# Patient Record
Sex: Female | Born: 1985
Health system: Southern US, Community
[De-identification: ages and names within clinical notes are randomized; demographics above are authoritative.]

## PROBLEM LIST (undated history)

## (undated) DIAGNOSIS — D649 Anemia, unspecified: Secondary | ICD-10-CM

## (undated) DIAGNOSIS — K589 Irritable bowel syndrome without diarrhea: Secondary | ICD-10-CM

## (undated) DIAGNOSIS — E7800 Pure hypercholesterolemia, unspecified: Secondary | ICD-10-CM

## (undated) DIAGNOSIS — F909 Attention-deficit hyperactivity disorder, unspecified type: Secondary | ICD-10-CM

## (undated) DIAGNOSIS — F329 Major depressive disorder, single episode, unspecified: Secondary | ICD-10-CM

## (undated) DIAGNOSIS — R87619 Unspecified abnormal cytological findings in specimens from cervix uteri: Secondary | ICD-10-CM

## (undated) DIAGNOSIS — A749 Chlamydial infection, unspecified: Secondary | ICD-10-CM

## (undated) DIAGNOSIS — IMO0002 Reserved for concepts with insufficient information to code with codable children: Secondary | ICD-10-CM

## (undated) DIAGNOSIS — K219 Gastro-esophageal reflux disease without esophagitis: Secondary | ICD-10-CM

## (undated) DIAGNOSIS — T7840XA Allergy, unspecified, initial encounter: Secondary | ICD-10-CM

## (undated) DIAGNOSIS — T148XXA Other injury of unspecified body region, initial encounter: Secondary | ICD-10-CM

## (undated) DIAGNOSIS — E559 Vitamin D deficiency, unspecified: Secondary | ICD-10-CM

## (undated) DIAGNOSIS — F419 Anxiety disorder, unspecified: Secondary | ICD-10-CM

## (undated) DIAGNOSIS — F32A Depression, unspecified: Secondary | ICD-10-CM

## (undated) HISTORY — DX: Attention-deficit hyperactivity disorder, unspecified type: F90.9

## (undated) HISTORY — DX: Vitamin D deficiency, unspecified: E55.9

## (undated) HISTORY — DX: Allergy, unspecified, initial encounter: T78.40XA

## (undated) HISTORY — DX: Anxiety disorder, unspecified: F41.9

## (undated) HISTORY — PX: WISDOM TOOTH EXTRACTION: SHX21

## (undated) HISTORY — DX: Depression, unspecified: F32.A

## (undated) HISTORY — DX: Anemia, unspecified: D64.9

## (undated) HISTORY — DX: Irritable bowel syndrome, unspecified: K58.9

## (undated) HISTORY — PX: COLPOSCOPY: SHX161

---
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
## 2004-04-08 DIAGNOSIS — A749 Chlamydial infection, unspecified: Secondary | ICD-10-CM

## 2004-04-08 HISTORY — DX: Chlamydial infection, unspecified: A74.9

## 2004-04-08 HISTORY — PX: COLPOSCOPY: SHX161

## 2004-05-21 ENCOUNTER — Other Ambulatory Visit: Admission: RE | Admit: 2004-05-21 | Discharge: 2004-05-21 | Payer: Self-pay | Admitting: Obstetrics and Gynecology

## 2004-10-05 ENCOUNTER — Other Ambulatory Visit: Admission: RE | Admit: 2004-10-05 | Discharge: 2004-10-05 | Payer: Self-pay | Admitting: Obstetrics and Gynecology

## 2010-05-28 ENCOUNTER — Emergency Department (HOSPITAL_COMMUNITY)
Admission: EM | Admit: 2010-05-28 | Discharge: 2010-05-28 | Disposition: A | Discharge status: Home / Self Care | Payer: Managed Care, Other (non HMO) | Attending: Emergency Medicine | Admitting: Emergency Medicine

## 2010-05-28 ENCOUNTER — Emergency Department (HOSPITAL_COMMUNITY): Payer: Managed Care, Other (non HMO)

## 2010-05-28 DIAGNOSIS — Z79899 Other long term (current) drug therapy: Secondary | ICD-10-CM | POA: Insufficient documentation

## 2010-05-28 DIAGNOSIS — R109 Unspecified abdominal pain: Secondary | ICD-10-CM | POA: Insufficient documentation

## 2010-05-28 DIAGNOSIS — IMO0001 Reserved for inherently not codable concepts without codable children: Secondary | ICD-10-CM | POA: Insufficient documentation

## 2010-05-28 DIAGNOSIS — R630 Anorexia: Secondary | ICD-10-CM | POA: Insufficient documentation

## 2010-05-28 DIAGNOSIS — R197 Diarrhea, unspecified: Secondary | ICD-10-CM | POA: Insufficient documentation

## 2010-05-28 DIAGNOSIS — R112 Nausea with vomiting, unspecified: Secondary | ICD-10-CM | POA: Insufficient documentation

## 2010-05-28 LAB — DIFFERENTIAL
Basophils Absolute: 0 10*3/uL (ref 0.0–0.1)
Basophils Relative: 0 % (ref 0–1)
Eosinophils Relative: 0 % (ref 0–5)
Monocytes Absolute: 0.7 10*3/uL (ref 0.1–1.0)

## 2010-05-28 LAB — COMPREHENSIVE METABOLIC PANEL
ALT: 14 U/L (ref 0–35)
AST: 28 U/L (ref 0–37)
Albumin: 3.7 g/dL (ref 3.5–5.2)
Calcium: 8.6 mg/dL (ref 8.4–10.5)
GFR calc Af Amer: >60 mL/min (ref >60)
Sodium: 141 mEq/L (ref 135–145)
Total Protein: 6.7 g/dL (ref 6.0–8.3)

## 2010-05-28 LAB — CBC
MCHC: 34.5 g/dL (ref 30.0–36.0)
Platelets: 264 10*3/uL (ref 150–400)
RDW: 12.9 % (ref 11.5–15.5)

## 2010-05-28 LAB — URINALYSIS, ROUTINE W REFLEX MICROSCOPIC
Hgb urine dipstick: NEGATIVE
Specific Gravity, Urine: 1.022 (ref 1.005–1.030)
Urobilinogen, UA: 0.2 mg/dL (ref 0.0–1.0)

## 2010-05-28 LAB — LIPASE, BLOOD: Lipase: 18 U/L (ref 11–59)

## 2011-12-30 ENCOUNTER — Other Ambulatory Visit (HOSPITAL_COMMUNITY): Payer: Self-pay | Admitting: Obstetrics and Gynecology

## 2011-12-30 DIAGNOSIS — N971 Female infertility of tubal origin: Secondary | ICD-10-CM

## 2012-01-06 ENCOUNTER — Ambulatory Visit (HOSPITAL_COMMUNITY)
Admission: RE | Admit: 2012-01-06 | Discharge: 2012-01-06 | Disposition: A | Discharge status: Home / Self Care | Payer: Managed Care, Other (non HMO) | Source: Ambulatory Visit | Attending: Obstetrics and Gynecology | Admitting: Obstetrics and Gynecology

## 2012-01-06 DIAGNOSIS — N971 Female infertility of tubal origin: Secondary | ICD-10-CM

## 2012-01-06 DIAGNOSIS — N979 Female infertility, unspecified: Secondary | ICD-10-CM | POA: Insufficient documentation

## 2012-01-06 MED ORDER — IOHEXOL 300 MG/ML  SOLN
10.0000 mL | Freq: Once | INTRAMUSCULAR | Status: AC | PRN
  Administered 2012-01-06: 10 mL

## 2012-09-30 LAB — OB RESULTS CONSOLE HEPATITIS B SURFACE ANTIGEN: Hepatitis B Surface Ag: NEGATIVE

## 2012-09-30 LAB — OB RESULTS CONSOLE RPR: RPR: NONREACTIVE

## 2012-09-30 LAB — OB RESULTS CONSOLE HIV ANTIBODY (ROUTINE TESTING): HIV: NONREACTIVE

## 2012-09-30 LAB — OB RESULTS CONSOLE GC/CHLAMYDIA
CHLAMYDIA, DNA PROBE: NEGATIVE
GC PROBE AMP, GENITAL: NEGATIVE

## 2012-09-30 LAB — OB RESULTS CONSOLE RUBELLA ANTIBODY, IGM: RUBELLA: IMMUNE

## 2012-09-30 LAB — OB RESULTS CONSOLE ANTIBODY SCREEN: Antibody Screen: NEGATIVE

## 2012-11-16 LAB — OB RESULTS CONSOLE ABO/RH: RH Type: POSITIVE

## 2013-03-16 LAB — OB RESULTS CONSOLE GBS: GBS: POSITIVE

## 2013-03-16 LAB — OB RESULTS CONSOLE GC/CHLAMYDIA: Gonorrhea: POSITIVE

## 2013-04-08 NOTE — L&D Delivery Note (Signed)
Delivery Note At 6:14 PM a viable female, "Angela Dixon", was delivered via Vaginal, Spontaneous Delivery (Presentation: Left Occiput Anterior).  APGAR: 8, 9; weight .   Placenta status: Intact, Spontaneous.  Cord: 3 vessels with the following complications: None.  Cord pH: Arterial 7.28, Venous 7.24 Baby with significant meconium staining.  No evidence abruption on examination of the placenta.  Anesthesia: Local  Episiotomy: None Lacerations: 2nd degree;Perineal Suture Repair: 3.0 chromic Est. Blood Loss (mL): 300 cc  Mom to postpartum.  Baby to Couplet care / Skin to Skin. No circumcision planned.   Donnel Saxon 04/09/2013, 7:15 PM

## 2013-04-09 ENCOUNTER — Encounter (HOSPITAL_COMMUNITY): Payer: Self-pay | Admitting: General Practice

## 2013-04-09 ENCOUNTER — Inpatient Hospital Stay (HOSPITAL_COMMUNITY)
Admission: AD | Admit: 2013-04-09 | Discharge: 2013-04-11 | DRG: 775 | Disposition: A | Discharge status: Home / Self Care | Payer: Managed Care, Other (non HMO) | Source: Ambulatory Visit | Attending: Obstetrics and Gynecology | Admitting: Obstetrics and Gynecology

## 2013-04-09 DIAGNOSIS — O99892 Other specified diseases and conditions complicating childbirth: Secondary | ICD-10-CM | POA: Diagnosis present

## 2013-04-09 DIAGNOSIS — E559 Vitamin D deficiency, unspecified: Secondary | ICD-10-CM | POA: Diagnosis present

## 2013-04-09 DIAGNOSIS — Z22330 Carrier of Group B streptococcus: Secondary | ICD-10-CM

## 2013-04-09 DIAGNOSIS — K219 Gastro-esophageal reflux disease without esophagitis: Secondary | ICD-10-CM | POA: Diagnosis present

## 2013-04-09 DIAGNOSIS — O9989 Other specified diseases and conditions complicating pregnancy, childbirth and the puerperium: Secondary | ICD-10-CM

## 2013-04-09 DIAGNOSIS — O4100X0 Oligohydramnios, unspecified trimester, not applicable or unspecified: Secondary | ICD-10-CM | POA: Diagnosis present

## 2013-04-09 HISTORY — DX: Other injury of unspecified body region, initial encounter: T14.8XXA

## 2013-04-09 HISTORY — DX: Gastro-esophageal reflux disease without esophagitis: K21.9

## 2013-04-09 HISTORY — DX: Chlamydial infection, unspecified: A74.9

## 2013-04-09 HISTORY — DX: Unspecified abnormal cytological findings in specimens from cervix uteri: R87.619

## 2013-04-09 HISTORY — DX: Reserved for concepts with insufficient information to code with codable children: IMO0002

## 2013-04-09 HISTORY — DX: Pure hypercholesterolemia, unspecified: E78.00

## 2013-04-09 LAB — CBC
HEMATOCRIT: 34.8 % — AB (ref 36.0–46.0)
HEMOGLOBIN: 11.6 g/dL — AB (ref 12.0–15.0)
MCH: 27.4 pg (ref 26.0–34.0)
MCHC: 33.3 g/dL (ref 30.0–36.0)
MCV: 82.3 fL (ref 78.0–100.0)
Platelets: 218 10*3/uL (ref 150–400)
RBC: 4.23 MIL/uL (ref 3.87–5.11)
RDW: 14.3 % (ref 11.5–15.5)
WBC: 13.2 10*3/uL — ABNORMAL HIGH (ref 4.0–10.5)

## 2013-04-09 LAB — ABO/RH: ABO/RH(D): A POS

## 2013-04-09 LAB — RPR: RPR Ser Ql: NONREACTIVE

## 2013-04-09 LAB — TYPE AND SCREEN
ABO/RH(D): A POS
Antibody Screen: NEGATIVE

## 2013-04-09 LAB — AMNISURE RUPTURE OF MEMBRANE (ROM) NOT AT ARMC: Amnisure ROM: NEGATIVE

## 2013-04-09 MED ORDER — TETANUS-DIPHTH-ACELL PERTUSSIS 5-2.5-18.5 LF-MCG/0.5 IM SUSP: 0.5000 mL | Freq: Once | INTRAMUSCULAR | Status: DC

## 2013-04-09 MED ORDER — WITCH HAZEL-GLYCERIN EX PADS: 1.0000 "application " | MEDICATED_PAD | CUTANEOUS | Status: DC | PRN

## 2013-04-09 MED ORDER — ONDANSETRON HCL 4 MG/2ML IJ SOLN
4.0000 mg | Freq: Four times a day (QID) | INTRAMUSCULAR | Status: DC | PRN
  Administered 2013-04-09 (×2): 4 mg via INTRAVENOUS
  Filled 2013-04-09 (×2): qty 2

## 2013-04-09 MED ORDER — LIDOCAINE HCL (PF) 1 % IJ SOLN
30.0000 mL | INTRAMUSCULAR | Status: DC | PRN
  Administered 2013-04-09: 30 mL via SUBCUTANEOUS
  Filled 2013-04-09 (×2): qty 30

## 2013-04-09 MED ORDER — LACTATED RINGERS IV SOLN: 500.0000 mL | INTRAVENOUS | Status: DC | PRN

## 2013-04-09 MED ORDER — LANOLIN HYDROUS EX OINT: TOPICAL_OINTMENT | CUTANEOUS | Status: DC | PRN

## 2013-04-09 MED ORDER — DIBUCAINE 1 % RE OINT: 1.0000 "application " | TOPICAL_OINTMENT | RECTAL | Status: DC | PRN

## 2013-04-09 MED ORDER — PENICILLIN G POTASSIUM 5000000 UNITS IJ SOLR
2.5000 10*6.[IU] | INTRAVENOUS | Status: DC
  Administered 2013-04-09: 2.5 10*6.[IU] via INTRAVENOUS
  Filled 2013-04-09 (×4): qty 2.5

## 2013-04-09 MED ORDER — BUTORPHANOL TARTRATE 1 MG/ML IJ SOLN: 1.0000 mg | INTRAMUSCULAR | Status: DC | PRN

## 2013-04-09 MED ORDER — DIPHENHYDRAMINE HCL 25 MG PO CAPS: 25.0000 mg | ORAL_CAPSULE | Freq: Four times a day (QID) | ORAL | Status: DC | PRN

## 2013-04-09 MED ORDER — PRENATAL MULTIVITAMIN CH
1.0000 | ORAL_TABLET | Freq: Every day | ORAL | Status: DC
  Administered 2013-04-10: 1 via ORAL
  Filled 2013-04-09 (×2): qty 1

## 2013-04-09 MED ORDER — IBUPROFEN 600 MG PO TABS
600.0000 mg | ORAL_TABLET | Freq: Four times a day (QID) | ORAL | Status: DC | PRN
Start: 2013-04-09 — End: 2013-04-09

## 2013-04-09 MED ORDER — BENZOCAINE-MENTHOL 20-0.5 % EX AERO
1.0000 "application " | INHALATION_SPRAY | CUTANEOUS | Status: DC | PRN
  Administered 2013-04-09: 1 via TOPICAL
  Filled 2013-04-09: qty 56

## 2013-04-09 MED ORDER — IBUPROFEN 600 MG PO TABS
600.0000 mg | ORAL_TABLET | Freq: Four times a day (QID) | ORAL | Status: DC
  Administered 2013-04-09 – 2013-04-11 (×6): 600 mg via ORAL
  Filled 2013-04-09 (×7): qty 1

## 2013-04-09 MED ORDER — ZOLPIDEM TARTRATE 5 MG PO TABS: 5.0000 mg | ORAL_TABLET | Freq: Every evening | ORAL | Status: DC | PRN

## 2013-04-09 MED ORDER — SIMETHICONE 80 MG PO CHEW: 80.0000 mg | CHEWABLE_TABLET | ORAL | Status: DC | PRN

## 2013-04-09 MED ORDER — OXYTOCIN 40 UNITS IN LACTATED RINGERS INFUSION - SIMPLE MED
62.5000 mL/h | INTRAVENOUS | Status: DC
  Administered 2013-04-09: 62.5 mL/h via INTRAVENOUS
  Filled 2013-04-09: qty 1000

## 2013-04-09 MED ORDER — ACETAMINOPHEN 325 MG PO TABS: 650.0000 mg | ORAL_TABLET | ORAL | Status: DC | PRN

## 2013-04-09 MED ORDER — CITRIC ACID-SODIUM CITRATE 334-500 MG/5ML PO SOLN
30.0000 mL | ORAL | Status: DC | PRN
  Filled 2013-04-09: qty 15

## 2013-04-09 MED ORDER — PENICILLIN G POTASSIUM 5000000 UNITS IJ SOLR
5.0000 10*6.[IU] | Freq: Once | INTRAVENOUS | Status: AC
  Administered 2013-04-09: 5 10*6.[IU] via INTRAVENOUS
  Filled 2013-04-09: qty 5

## 2013-04-09 MED ORDER — OXYTOCIN BOLUS FROM INFUSION: 500.0000 mL | INTRAVENOUS | Status: DC

## 2013-04-09 MED ORDER — ONDANSETRON HCL 4 MG/2ML IJ SOLN: 4.0000 mg | INTRAMUSCULAR | Status: DC | PRN

## 2013-04-09 MED ORDER — OXYCODONE-ACETAMINOPHEN 5-325 MG PO TABS
1.0000 | ORAL_TABLET | ORAL | Status: DC | PRN
Start: 2013-04-09 — End: 2013-04-11

## 2013-04-09 MED ORDER — OXYCODONE-ACETAMINOPHEN 5-325 MG PO TABS
1.0000 | ORAL_TABLET | ORAL | Status: DC | PRN
  Administered 2013-04-09: 1 via ORAL
  Filled 2013-04-09: qty 1

## 2013-04-09 MED ORDER — ONDANSETRON HCL 4 MG PO TABS: 4.0000 mg | ORAL_TABLET | ORAL | Status: DC | PRN

## 2013-04-09 MED ORDER — FLEET ENEMA 7-19 GM/118ML RE ENEM: 1.0000 | ENEMA | RECTAL | Status: DC | PRN

## 2013-04-09 MED ORDER — SENNOSIDES-DOCUSATE SODIUM 8.6-50 MG PO TABS
2.0000 | ORAL_TABLET | ORAL | Status: DC
Start: 2013-04-10 — End: 2013-04-11
  Administered 2013-04-10 – 2013-04-11 (×2): 2 via ORAL
  Filled 2013-04-09 (×2): qty 2

## 2013-04-09 MED ORDER — LACTATED RINGERS IV SOLN
INTRAVENOUS | Status: DC
  Administered 2013-04-09 (×2): via INTRAVENOUS

## 2013-04-09 NOTE — H&P (Signed)
Angela Dixon is a 28 y.o. female, G1P1 at 19 1/7 weeks, presenting for admission due to likely early labor, ? SROM at 7am, and oligohydramnios.  Seen in office today for regular visit, with UCs q 7 min--NST showed early decels vs variables.  US showed oligohydramnios, but BPP 8/8.  Cervix was 1 1/2 in office.  Patient denies bleeding, reports +FM.  Has had increased yellowish d/c since this am, mixed with mucus.  Problem List: GBS positive Reflux Vit D deficiency Oligohydramnios    History of present pregnancy: Patient entered care at 19 5/7 weeks, as a transfer for waterbirth.   EDC of  was established by LMP.   Anatomy scan:  19 5/7 weeks, with limited anatomy and an posterior placenta.   Additional [place] evaluations:  21 4/7 weeks--f/u anatomy WNL, normal fluid 37 4/7 weeks--? Presentation.  US showed vtx, normal fluid 40 1/7 weeks--Oligo, BPP 8/8, vtx.   Significant prenatal events:  Transfer in at 19 weeks for waterbirth.  Attended WB class, selected [employer] as doula.  Toxo titers done, non-immune, but no evidence of recent infection.  Received flu shot 12/26/12, TDaP 02/15/13.  Had carpal tunnel sx at 33 weeks.   Last evaluation:  04/09/12, with findings as above.  OB History   Grav Para Term Preterm Abortions TAB SAB Ect Mult Living   1              Medical Hx:  Reflux, fx wrist in past, hx colpo, usual childhood illnesses, elevated cholesterol 2012, anemia 2004, hx occasional Xanax use for anxiety.  History reviewed. No pertinent past surgical history. Family History: Mother etoh, varicosities; Father etoh: MGM heart dx, HTN, DM, varicose veins;  MGF heart dx, HTN, kidney dx, lung CA; Sister asthma; PGF COPD, oral cancer; MU DM; PU epilepsy; PGF cancer of pharynx.  Social History:  has no tobacco, alcohol, and drug history on file.  Previous smoker in past, none since pregnancy.  Employed in banking.  Married to [employer], who is involved and supportive.  Denies any illict drug  use or etoh   Prenatal Transfer Tool  Maternal Diabetes: No Genetic Screening: Normal Maternal Ultrasounds/Referrals: Abnormal:  Findings:   Other:Oligo today Fetal Ultrasounds or other Referrals:  None Maternal Substance Abuse:  No Significant Maternal Medications:  None Significant Maternal Lab Results: Lab values include: Group B Strep positive    ROS:  Contractions, leaking fluid, +FM  Allergies:  NKDA    Last menstrual period 07/03/2012.  Physical Exam: Chest clear Heart RRR without murmur Abd gravid, NT, FH 39 cm Pelvic: 1 1/2, 70% at office, vtx Ext: WNL  FHR: Category 2 on arrival, with early decels, very occasioinal late decels, and occasional quick variables. UCs:  q 4-6 min, moderate  Prenatal labs: ABO, Rh:  A+ Antibody:  Neg Rubella:   Immune RPR:   NR HBsAg:   Neg HIV:   NR GBS:  Positive Sickle cell/Hgb electrophoresis:  NA Pap:  08/2012 GC:  Neg early pregnancy Chlamydia:  Neg early pregnancy Genetic screenings:  Normal AFP at [facility] Glucola:  Normal Other:  Hgb WNL       Assessment/Plan: IUP at 40 weeks Early labor Likely SROM around 7am, with MSF GBS positive Desires waterbirth and non-interventive labor    Plan Admit to [facility] per consult with Dr. Charlesetta Garibaldi Routine [facility] orders Monitor FHT at present, will decide regarding WB tub use--likely will defer use, due to probable MSF and hx of ?  FHR decels. Amnisure  Angela Dixon, [place], [place] 04/09/2013, 1:11 PM

## 2013-04-09 NOTE — Progress Notes (Signed)
  Subjective: Breathing with contractions.  Doula and husband at bedside, very supportive.    Objective: BP 121/58  Pulse 79  Temp(Src) 98.2 F (36.8 C) (Oral)  Resp 18  Ht 5\' 4"  (1.626 m)  Wt 218 lb (98.884 kg)  BMI 37.40 kg/m2  SpO2 100%  LMP 07/03/2012 I/O last 3 completed shifts: In: -  Out: 300 [Blood:300]    FHT:  Prolonged decel x 4-5 min, then resolution to baseline of 135-145 with scalp stim UC:   regular, every 3 minutes SVE:   4 cm, 100%, vtx, -1. Leaking light MSF now (amnisure negative on admission)  Assessment / Plan: Active labor MSF GBS positive FHR decels Plan close observation of FHR status.   Consider internal monitoring prn. No use of birth tub.  Angela Dixon, [place] 04/09/2013, 4:30pm

## 2013-04-09 NOTE — Progress Notes (Signed)
Patient ID: Angela Dixon, female   DOB: 1985-07-21, 28 y.o.   MRN: [id number] BP 148/112  Pulse 157  Temp(Src) 97.7 F (36.5 C) (Oral)  Resp 18  Ht 5\' 4"  (1.626 m)  Wt 218 lb (98.884 kg)  BMI 37.40 kg/m2  SpO2 100%  LMP 07/03/2012 Pt in pain from contractions FHTS 130s with LTV with fetal electrode.   cx 8/100/0 Bloody show Active labor with variables Great variability Will monitor closely if varibility decreases or she has prolonged decelerations will consider cesarean delivery Pt is moving fast and may have a vaginal delivery

## 2013-04-10 LAB — CBC
HEMATOCRIT: 28.6 % — AB (ref 36.0–46.0)
Hemoglobin: 9.6 g/dL — ABNORMAL LOW (ref 12.0–15.0)
MCH: 27.6 pg (ref 26.0–34.0)
MCHC: 33.6 g/dL (ref 30.0–36.0)
MCV: 82.2 fL (ref 78.0–100.0)
Platelets: 207 10*3/uL (ref 150–400)
RBC: 3.48 MIL/uL — ABNORMAL LOW (ref 3.87–5.11)
RDW: 14.4 % (ref 11.5–15.5)
WBC: 20.9 10*3/uL — ABNORMAL HIGH (ref 4.0–10.5)

## 2013-04-10 MED ORDER — PANTOPRAZOLE SODIUM 40 MG PO TBEC
40.0000 mg | DELAYED_RELEASE_TABLET | Freq: Every day | ORAL | Status: DC
  Administered 2013-04-10 – 2013-04-11 (×2): 40 mg via ORAL
  Filled 2013-04-10 (×2): qty 1

## 2013-04-10 MED ORDER — LORATADINE 10 MG PO TABS
10.0000 mg | ORAL_TABLET | Freq: Every day | ORAL | Status: DC
  Administered 2013-04-10 – 2013-04-11 (×2): 10 mg via ORAL
  Filled 2013-04-10 (×2): qty 1

## 2013-04-11 NOTE — Discharge Summary (Signed)
Vaginal Delivery Discharge Summary  Angela Dixon  DOB:    05-28-1985 MRN:    [id number] CSN:    [id number]  Date of admission:                  04/09/13  Date of discharge:                   04/11/13  Procedures this admission:  SVB, repair of 2nd degree laceration  Date of Delivery: 04/09/13  Newborn Data:  Live born female  Birth Weight: 7 lb 3.3 oz (3269 g) APGAR: 8, 9  Home with mother. Circumcision Plan: None  History of Present Illness:  [name] is a 28 y.o. female, G1P1001, who presents at [redacted]w[redacted]d weeks gestation. The patient has been followed at the [facility] division of [facility]. She was admitted onset of labor. Her pregnancy has been complicated by:  Patient Active Problem List   Diagnosis Date Noted  . Vaginal delivery 04/09/2013  . Second-degree perineal laceration, with delivery 04/09/2013  MSF FHR decelerations Oligohydramnios  Hospital course:  The patient was admitted from the office in early labor, and had an [place] there showing oligohydramnios, but uncertain SROM.    On arrival at the hospital, she had small amount of leaking of ? Light MSF, but amnisure was negative.  She then ruptured spontaneously and had rapid progression of her labor.  She did have sporadic FHR decels, which kept her from her previous plan of a waterbirth.  She received no pain med in labor by her choice, and progressed to vaginal delivery, attended by Donnel Saxon, CNM. Her labor was complicated by oligohydramnios, FHR decels, and MSF. She proceeded to have a vaginal delivery of a healthy infant. Arterial and venous pH were WNL and the baby did well.  Her delivery was not complicated, with a 2nd degree perineal laceration.  Her postpartum course was not complicated.  She was discharged to home on postpartum day 2 doing well.  Feeding:  breast  Contraception:  Undecided at present  Discharge hemoglobin:  Hemoglobin  Date Value  Range Status  04/10/2013 9.6* 12.0 - 15.0 g/dL Final     REPEATED TO VERIFY     DELTA CHECK NOTED     HCT  Date Value Range Status  04/10/2013 28.6* 36.0 - 46.0 % Final    Discharge Physical Exam:   General: alert Lochia: appropriate Uterine Fundus: firm Incision: healing well DVT Evaluation: No evidence of DVT seen on physical exam. Negative Homan's sign.  Intrapartum Procedures: spontaneous vaginal delivery Postpartum Procedures: none Complications-Operative and Postpartum: 2nd degree perineal laceration  Discharge Diagnoses: Term Pregnancy-delivered and oligohydramnios, MSF, FHR decelerations, rapid labor  Discharge Information:  Activity:           Per [facility] handout Diet:                routine Medications: Ibuprofen Condition:      stable Instructions:   Postpartum Care After Vaginal Delivery  After you deliver your newborn (postpartum period), the usual stay in the hospital is 24 72 hours. If there were problems with your labor or delivery, or if you have other medical problems, you might be in the hospital longer.  While you are in the hospital, you will receive help and instructions on how to care for yourself and your newborn during the postpartum period.  While you are in the hospital:  Be sure to tell your  nurses if you have pain or discomfort, as well as where you feel the pain and what makes the pain worse.  If you had an incision made near your vagina (episiotomy) or if you had some tearing during delivery, the nurses may put ice packs on your episiotomy or tear. The ice packs may help to reduce the pain and swelling.  If you are breastfeeding, you may feel uncomfortable contractions of your uterus for a couple of weeks. This is normal. The contractions help your uterus get back to normal size.  It is normal to have some bleeding after delivery.  For the first 1 3 days after delivery, the flow is red and the amount may be similar to a period.  It is common  for the flow to start and stop.  In the first few days, you may pass some small clots. Let your nurses know if you begin to pass large clots or your flow increases.  Do not  flush blood clots down the toilet before having the nurse look at them.  During the next 3 10 days after delivery, your flow should become more watery and pink or brown-tinged in color.  Ten to fourteen days after delivery, your flow should be a small amount of yellowish-white discharge.  The amount of your flow will decrease over the first few weeks after delivery. Your flow may stop in 6 8 weeks. Most women have had their flow stop by 12 weeks after delivery.  You should change your sanitary pads frequently.  Wash your hands thoroughly with soap and water for at least 20 seconds after changing pads, using the toilet, or before holding or feeding your newborn.  You should feel like you need to empty your bladder within the first 6 8 hours after delivery.  In case you become weak, lightheaded, or faint, call your nurse before you get out of bed for the first time and before you take a shower for the first time.  Within the first few days after delivery, your breasts may begin to feel tender and full. This is called engorgement. Breast tenderness usually goes away within 48 72 hours after engorgement occurs. You may also notice milk leaking from your breasts. If you are not breastfeeding, do not stimulate your breasts. Breast stimulation can make your breasts produce more milk.  Spending as much time as possible with your newborn is very important. During this time, you and your newborn can feel close and get to know each other. Having your newborn stay in your room (rooming in) will help to strengthen the bond with your newborn. It will give you time to get to know your newborn and become comfortable caring for your newborn.  Your hormones change after delivery. Sometimes the hormone changes can temporarily cause you to  feel sad or tearful. These feelings should not last more than a few days. If these feelings last longer than that, you should talk to your caregiver.  If desired, talk to your caregiver about methods of family planning or contraception.  Talk to your caregiver about immunizations. Your caregiver may want you to have the following immunizations before leaving the hospital:  Tetanus, diphtheria, and pertussis (Tdap) or tetanus and diphtheria (Td) immunization. It is very important that you and your family (including grandparents) or others caring for your newborn are up-to-date with the Tdap or Td immunizations. The Tdap or Td immunization can help protect your newborn from getting ill.  Rubella immunization.  Varicella (chickenpox)  immunization.  Influenza immunization. You should receive this annual immunization if you did not receive the immunization during your pregnancy. Document Released: 01/20/2007 Document Revised: 12/18/2011 Document Reviewed: 11/20/2011 [facility] Patient Information 2014 [place].   Postpartum Depression and Baby Blues  The postpartum period begins right after the birth of a baby. During this time, there is often a great amount of joy and excitement. It is also a time of considerable changes in the life of the parent(s). Regardless of how many times a mother gives birth, each child brings new challenges and dynamics to the family. It is not unusual to have feelings of excitement accompanied by confusing shifts in moods, emotions, and thoughts. All mothers are at risk of developing postpartum depression or the "baby blues." These mood changes can occur right after giving birth, or they may occur many months after giving birth. The baby blues or postpartum depression can be mild or severe. Additionally, postpartum depression can resolve rather quickly, or it can be a long-term condition. CAUSES Elevated hormones and their rapid decline are thought to be a main cause  of postpartum depression and the baby blues. There are a number of hormones that radically change during and after pregnancy. Estrogen and progesterone usually decrease immediately after delivering your baby. The level of thyroid hormone and various cortisol steroids also rapidly drop. Other factors that play a major role in these changes include major life events and genetics.  RISK FACTORS If you have any of the following risks for the baby blues or postpartum depression, know what symptoms to watch out for during the postpartum period. Risk factors that may increase the likelihood of getting the baby blues or postpartum depression include:  Havinga personal or family history of depression.  Having depression while being pregnant.  Having premenstrual or oral contraceptive-associated mood issues.  Having exceptional life stress.  Having marital conflict.  Lacking a social support network.  Having a baby with special needs.  Having health problems such as diabetes. SYMPTOMS Baby blues symptoms include:  Brief fluctuations in mood, such as going from extreme happiness to sadness.  Decreased concentration.  Difficulty sleeping.  Crying spells, tearfulness.  Irritability.  Anxiety. Postpartum depression symptoms typically begin within the first month after giving birth. These symptoms include:  Difficulty sleeping or excessive sleepiness.  Marked weight loss.  Agitation.  Feelings of worthlessness.  Lack of interest in activity or food. Postpartum psychosis is a very concerning condition and can be dangerous. Fortunately, it is rare. Displaying any of the following symptoms is cause for immediate medical attention. Postpartum psychosis symptoms include:  Hallucinations and delusions.  Bizarre or disorganized behavior.  Confusion or disorientation. DIAGNOSIS  A diagnosis is made by an evaluation of your symptoms. There are no medical or lab tests that lead to a  diagnosis, but there are various questionnaires that a caregiver may use to identify those with the baby blues, postpartum depression, or psychosis. Often times, a screening tool called the [place] Postnatal Depression Scale is used to diagnose depression in the postpartum period.  TREATMENT The baby blues usually goes away on its own in 1 to 2 weeks. Social support is often all that is needed. You should be encouraged to get adequate sleep and rest. Occasionally, you may be given medicines to help you sleep.  Postpartum depression requires treatment as it can last several months or longer if it is not treated. Treatment may include individual or group therapy, medicine, or both to address any social,  physiological, and psychological factors that may play a role in the depression. Regular exercise, a healthy diet, rest, and social support may also be strongly recommended.  Postpartum psychosis is more serious and needs treatment right away. Hospitalization is often needed. HOME CARE INSTRUCTIONS  Get as much rest as you can. Nap when the baby sleeps.  Exercise regularly. Some women find yoga and walking to be beneficial.  Eat a balanced and nourishing diet.  Do little things that you enjoy. Have a cup of tea, take a bubble bath, read your favorite magazine, or listen to your favorite music.  Avoid alcohol.  Ask for help with household chores, cooking, grocery shopping, or running errands as needed. Do not try to do everything.  Talk to people close to you about how you are feeling. Get support from your partner, family members, friends, or other new moms.  Try to stay positive in how you think. Think about the things you are grateful for.  Do not spend a lot of time alone.  Only take medicine as directed by your caregiver.  Keep all your postpartum appointments.  Let your caregiver know if you have any concerns. SEEK MEDICAL CARE IF: You are having a reaction or problems with your  medicine. SEEK IMMEDIATE MEDICAL CARE IF:  You have suicidal feelings.  You feel you may harm the baby or someone else. Document Released: 12/28/2003 Document Revised: 06/17/2011 Document Reviewed: 01/29/2011 [facility] Patient Information 2014 [place], [place].   Discharge to: home  Follow-up Information   Follow up with [facility]. Schedule an appointment as soon as possible for a visit in 6 weeks. (Call for any questions or concerns.)    Specialty:  Obstetrics and Gynecology   Contact information:   [address] [address] [phone]       Donnel Saxon 04/11/2013

## 2013-04-11 NOTE — Discharge Instructions (Signed)

## 2013-05-11 ENCOUNTER — Ambulatory Visit (HOSPITAL_COMMUNITY)
Admission: RE | Admit: 2013-05-11 | Discharge: 2013-05-11 | Disposition: A | Discharge status: Home / Self Care | Payer: Managed Care, Other (non HMO) | Source: Ambulatory Visit | Attending: Obstetrics and Gynecology | Admitting: Obstetrics and Gynecology

## 2013-05-11 NOTE — Lactation Note (Addendum)
Infant Lactation Consultation Outpatient Visit Note  Patient Name: Angela Dixon Date of Birth: 23-Nov-1985 Angela Dixon  Birth Weight:  7-3 oz  D/C weight : 6-14 oz 1st Dr. Visit - 7-1 oz ( 1/6)   2nd weight - 7-4 oz ( 1/16)  3rd weight - 7-12 oz ( 1/30 )  Gestational Age at Delivery: Gestational Age: <None> Type of Delivery: Vaginal , per mom also has a hx of infertility , mentioned she had (+) breast changes in 1st trimester of pregnancy ( size and sore ness)  [facility] due to low weight gain and having a desire to have a feeding assessment  Breastfeeding History- per mom milk came in 3 days after birth, denies engorgement at that time, or problems with sore nipples.  Mom mentioned Angela Dixon has always been interested in feeding both breast and total feeding time taking 30 -40 mins. She also mentioned Angela Dixon does  Have times of hanging out at the breast .  Frequency of Breastfeeding: per mom every 2-3 hours and sometimes at night 4 hours  Length of Feeding: 30 -40 mins  Voids: >6 Stools: > 2-3 yellowish brown   Supplementing / Method: Pumping:  Type of Pump: DEBP Medela pump -    Frequency: 1st time pumping at consult   Volume:    Comments: reviewed set up and checked flanges , #24 fit well for the right and #24 flange for the left                      Somewhat tight per mom , LC recommended increasing the left to #27 - provided at consult.    Consultation Evaluation: Both breast full, no engorgement , nipples both appear healthy , no breakdown. Last feeding per mom - [phone] for 28 mins ( Angela woke up late ). Angela alert and rooting , showing feeding cues.   Initial Feeding Assessment: Pre-feed Weight:  7-15.9 oz 3626 g  Post-feed Weight: 8-0.1 oz 3630 g  Amount Transferred: 4 ml  Comments: Mom latched Angela Dixon onto the left breast. LC noted Angela Dixon rolling upper lip under , LC flipped upward to flange position.  Noted swallows , increased with breast  compressions. Noted Angela Dixon to become non-nutritive intermittently with feeding. Mom  Stimulated him and he easily was able to be nutritive again. Fed for 10 mins and only transferred off 4 ml . ( CONCERNING )   Additional Feeding Assessment: wet diaper changed and  Re-weight  7-15.2 oz 3608 g , 2nd wet diaper changed again  Pre-feed Weight: 7- 14.8 oz 3594 g  Post-feed Weight: 8-0.6 oz 3646 g Amount Transferred: 52ml ( 22 ml of breast milk transferred and 30 mil from the SNS transferred )  Comments: Due to low volume transferred with 1st latch, low weight gain, and noted intermittent non - nutritive sucking,LC recommended  Adding an SNS while the Angela is feeding at the breast. Therefore getting the Angela into a more consistent feeding pattern ( nutritive ) and  Giving mom the stimulation to increase milk production.  LC showed mom how to set up the ( long term supplementing system) using the white  Medium flow gage attachment . Angela tolerated the flow well and took in 30 ml of formula and fed for 20 mins with a consistent pattern with multiply swallows, Pausing intermittently, but non sucking in a non - nutritive manner. Mom seemed pleased .   Additional Feeding  Assessment: supplemented  10 ml more of formula while mom was post pumping and mom fed   Comments- After feeding with SNS at the breast had mom post pump both breast EBM yield = 24 ml                    - Discussed with mom the Angela is behind with his weight and her milk supply is down. LC recommended starting on Mothers Love Herbs                       Today to increase milk supply , and supplementing and post pumping . Stressed the importance of meeting the nutritional needs of the Angela by increasing the calories today.                       Started at this consult.   Total Breast milk Transferred this Visit: 26  Total Supplement Given: 30 ml ( SNS), 10 ML from bottle ( Formula) ,  While mom pumped LC fed 10 ml Formula Angela Dixon didn't  what anymore )  After mom pumped - she fed 24 ml of EBM ( Angela Dixon rooting , acting hungry )  Total volume at consult - 90 ml   Lactation Plan of Care - Mom - rest and naps when you can , plenty fluids, esp H20 , nutritious snacks and meals                                          - Feedings- Every 2 1/2 - 3 hours                                          - Option #1 - Feed at the breast with SNS ( 2-3 oz ) - post pump for 10 -15 mins                                          - Option #2 - Feed at breast for 15 -20 mins and supplement after with a bottle ( medium base nipple ) 2-3 oz )                                          - Extra pumping - AFTER 3-4 feedings a day post pump for 10 -15 mins both breast together ( can increase to 6 if desire ) - save milk and supplement it back.  Praised mom for her efforts breast feeding                                               Follow-Up- F/U with lactation 2/11 Wednesday 9am                    - LC also recommended attending the [facility] on Monday evenings 7pm or Tuesday at 11am .( mom planned to attend today  Angela Dixon 05/11/2013, 9:51 AM

## 2013-05-19 ENCOUNTER — Ambulatory Visit (HOSPITAL_COMMUNITY)
Admission: RE | Admit: 2013-05-19 | Discharge: 2013-05-19 | Disposition: A | Discharge status: Home / Self Care | Payer: Managed Care, Other (non HMO) | Source: Ambulatory Visit | Attending: Obstetrics and Gynecology | Admitting: Obstetrics and Gynecology

## 2013-05-19 NOTE — Lactation Note (Addendum)
Infant Lactation Consultation Outpatient Visit Note  Patient Name: Angela Dixon Date of Birth: 11-26-1985   Gestational Age at Delivery: Gestational Age: <None> Type of Delivery: Vaginal Delivery  Date of birth -  BW - 7-3 oz  2/3 - 7-15.9 oz  Today's weight - 9-0.3 oz 4092 g   Breastfeeding History- Per mom since [facility] consult last week 2/3 , following the plan and supplementing with  SNS at 4-5 feedings a day , otherwise supplementing with a bottle ( broad base ).  Per mom history of infertility  Frequency of Breastfeeding: every 2-3 hours  Length of Feeding: 20 mins both breast  Voids: >6 Stools: 1-2 yellowish stool   Supplementing / Method: EBM and or formula ( Enfamil - newborn ) up to 2 oz majority of time  Pumping:  Type of Pump: DEBP medela    Frequency: after 2 feedings   Volume:  1-2 oz   Comments:- right #24 flange , on the left #27 flange with a hands free pumping bra     Consultation Evaluation: Both breast full , not engorged , nipples appear heathy , no breakdown ,                                             Baby alert , smiling at times , rooting , acting hungry , appears to have gained weight and looks filled out.                                             Last feeding at 6 am - per mom at breast and supplemented with a bottle.   Initial Feeding Assessment: Pre-feed Weight:9-0.3 oz 4092 g  Post-feed Weight: 9-1.5 oz 4124g  Amount Transferred: 32 oz  Comments: Angela Dixon latched on the right breast , cross cradle with depth and a consistent swallowing pattern. Fed for 12 mins . Per mom comfortable feeding and nipple appeared normal when baby released.   Additional Feeding Assessment: Pre-feed Weight: 9-1.5 oz 4124 g  Post-feed Weight: 9-2.5 oz 4154 g  Amount Transferred: 30 ml  Comments:Angela Dixon latched on the left breast , cross cradle with depth and consistent swallowing patter,  Per mom comfortable with latch , fed 20 mins   Additional Feeding Assessment: wet  diaper changed  Pre-feed Weight: 9-1.7 oz 4130 g  Post-feed Weight: 9 - 2.0 oz 4138 g  Amount Transferred: 8 ml  Comments: re-latched left breast for 10 mins and started hanging out , so mom released suction and finished feeding with  supplementing formula   Total Breast milk Transferred this Visit: 70 ml  Total Supplement Given: ( formula ) 58 ml   Lactation Plan of Care - Change the right flange for pumping to #30 and left if needed to #27. Mom aware to be checking volume.                                       - post pump after am feeding , midday , and after noon , save milk , supplement back                                       -  Follow the same plan as last week - with the 2 options. ( per mom has plan from last week )                                         ( feeding at breast and supplementing  with SNS or bottle )                                       - Continue Herbs - Mother love -                                       - Post pumping is important ,                                          Follow-Up- LC recommended BFSG for support and weight checks - Monday 7pm or Tuesday 11am                   - Per mom 2 month visit with Dr. Karleen Dixon on 06/15/2013       [name] 05/19/2013, 9:19 AM

## 2013-11-10 ENCOUNTER — Other Ambulatory Visit: Payer: Self-pay | Admitting: Internal Medicine

## 2013-12-07 ENCOUNTER — Encounter: Payer: Self-pay | Admitting: *Deleted

## 2013-12-07 DIAGNOSIS — E559 Vitamin D deficiency, unspecified: Secondary | ICD-10-CM | POA: Insufficient documentation

## 2013-12-09 ENCOUNTER — Encounter: Payer: Self-pay | Admitting: Emergency Medicine

## 2013-12-09 ENCOUNTER — Ambulatory Visit (INDEPENDENT_AMBULATORY_CARE_PROVIDER_SITE_OTHER): Payer: Managed Care, Other (non HMO) | Admitting: Emergency Medicine

## 2013-12-09 VITALS — BP 116/80 | HR 82 | Temp 98.0°F | Resp 16 | Wt 229.0 lb

## 2013-12-09 DIAGNOSIS — F3289 Other specified depressive episodes: Secondary | ICD-10-CM

## 2013-12-09 DIAGNOSIS — Z111 Encounter for screening for respiratory tuberculosis: Secondary | ICD-10-CM

## 2013-12-09 DIAGNOSIS — F329 Major depressive disorder, single episode, unspecified: Secondary | ICD-10-CM

## 2013-12-09 DIAGNOSIS — Z0000 Encounter for general adult medical examination without abnormal findings: Secondary | ICD-10-CM

## 2013-12-09 DIAGNOSIS — F32A Depression, unspecified: Secondary | ICD-10-CM

## 2013-12-09 LAB — CBC WITH DIFFERENTIAL/PLATELET
BASOS ABS: 0 10*3/uL (ref 0.0–0.1)
Basophils Relative: 0 % (ref 0–1)
Eosinophils Absolute: 0.3 10*3/uL (ref 0.0–0.7)
Eosinophils Relative: 3 % (ref 0–5)
HEMATOCRIT: 39.7 % (ref 36.0–46.0)
Hemoglobin: 13.3 g/dL (ref 12.0–15.0)
Lymphocytes Relative: 29 % (ref 12–46)
Lymphs Abs: 3 10*3/uL (ref 0.7–4.0)
MCH: 27.6 pg (ref 26.0–34.0)
MCHC: 33.5 g/dL (ref 30.0–36.0)
MCV: 82.4 fL (ref 78.0–100.0)
Monocytes Absolute: 0.8 10*3/uL (ref 0.1–1.0)
Monocytes Relative: 8 % (ref 3–12)
NEUTROS ABS: 6.1 10*3/uL (ref 1.7–7.7)
Neutrophils Relative %: 60 % (ref 43–77)
PLATELETS: 302 10*3/uL (ref 150–400)
RBC: 4.82 MIL/uL (ref 3.87–5.11)
RDW: 14.3 % (ref 11.5–15.5)
WBC: 10.2 10*3/uL (ref 4.0–10.5)

## 2013-12-09 LAB — HEMOGLOBIN A1C
HEMOGLOBIN A1C: 5.4 % (ref <5.7)
MEAN PLASMA GLUCOSE: 108 mg/dL (ref <117)

## 2013-12-09 MED ORDER — SERTRALINE HCL 50 MG PO TABS: 50.0000 mg | ORAL_TABLET | Freq: Every day | ORAL | Status: DC

## 2013-12-09 NOTE — Patient Instructions (Addendum)
Folic Acid in Pregnancy Folic acid is a B vitamin that helps prevent neural tube defects (NTDs). The neural tube is the part of a developing baby that becomes the brain and spinal cord. When the neural tube does not close properly, a baby is born with an NTD. NTDs include spina bifida, hernia of the spinal cord, and the absence of part or all of the brain (anencephaly).  Take folic acid at least 4 weeks before getting pregnant and through the first 3 months of pregnancy. This is when the neural tube is developing. It is available in most multivitamins, as a folic-acid-only supplement, and in some foods. Taking the right amount of folic acid before conception and during pregnancy lessens the chance of having a baby born with an NTD. Giving folic acid will not affect a neural tube defect if it is already present. DIAGNOSIS   An alpha fetoprotein (AFP) blood or amniotic fluid test will show high levels of the alpha fetoprotein if a woman is carrying a baby with an NTD. This test is done on all pregnant women in the first trimester.  An ultrasound may detect an NTD. WHAT YOU CAN DO:  Take a multivitamin with at least 0.4 milligrams (400 micrograms) of folic acid daily at least 4 weeks before getting pregnant and through the first 12 weeks of pregnancy.  If you have already had a pregnancy affected by an NTD, take 4 milligrams (4,000 micrograms) of folic acid daily. Take this amount 1 month before you start trying to get pregnant and continue through the first 3 months of pregnancy. If you have a seizure disorder or take medicines to control seizures, tell your maternity care provider. Continue to take your folic acid unless you are told otherwise.  FOLIC ACID IN FOODS Eat a healthy diet that has foods that contain folic acid, the natural form of the vitamin. Such foods include:  Fortified breakfast cereals.  Lentils.  Asparagus.  Spinach.  Organ meats (liver).  Black beans.  Peanuts (eat  only if you do not have a peanut allergy).  Broccoli.  Strawberries, oranges.  Orange juice (from concentrate is best).  Enriched breads and pasta.  Romaine lettuce. TALK TO YOUR HEALTH CARE PROVIDER IF:  You are in your first trimester and have high blood sugar.  You are in your first trimester and develop a high fever. In almost all cases, a fetus found to have an NTD will need specialized care that may not be available in all hospitals. Talk to your health care provider about what is best for you and your baby. Document Released: 03/28/2003 Document Revised: 08/09/2013 Document Reviewed: 06/28/2009 [facility] Patient Information 2015 [place], [place]. This information is not intended to replace advice given to you by your health care provider. Make sure you discuss any questions you have with your health care provider.  Start 1/2 tab of Zoloft 50 mg x 3 days with increased folic acid and large meal, increase to whole tablet, call with any concerns.   Postpartum Depression and Baby Blues The postpartum period begins right after the birth of a baby. During this time, there is often a great amount of joy and excitement. It is also a time of many changes in the life of the parents. Regardless of how many times a mother gives birth, each child brings new challenges and dynamics to the family. It is not unusual to have feelings of excitement along with confusing shifts in moods, emotions, and thoughts. All mothers are  at risk of developing postpartum depression or the "baby blues." These mood changes can occur right after giving birth, or they may occur many months after giving birth. The baby blues or postpartum depression can be mild or severe. Additionally, postpartum depression can go away rather quickly, or it can be a long-term condition.  CAUSES Raised hormone levels and the rapid drop in those levels are thought to be a main cause of postpartum depression and the baby blues. A number of  hormones change during and after pregnancy. Estrogen and progesterone usually decrease right after the delivery of your baby. The levels of thyroid hormone and various cortisol steroids also rapidly drop. Other factors that play a role in these mood changes include major life events and genetics.  RISK FACTORS If you have any of the following risks for the baby blues or postpartum depression, know what symptoms to watch out for during the postpartum period. Risk factors that may increase the likelihood of getting the baby blues or postpartum depression include:  Having a personal or family history of depression.   Having depression while being pregnant.   Having premenstrual mood issues or mood issues related to oral contraceptives.  Having a lot of life stress.   Having marital conflict.   Lacking a social support network.   Having a baby with special needs.   Having health problems, such as diabetes.  SIGNS AND SYMPTOMS Symptoms of baby blues include:  Brief changes in mood, such as going from extreme happiness to sadness.  Decreased concentration.   Difficulty sleeping.   Crying spells, tearfulness.   Irritability.   Anxiety.  Symptoms of postpartum depression typically begin within the first month after giving birth. These symptoms include:  Difficulty sleeping or excessive sleepiness.   Marked weight loss.   Agitation.   Feelings of worthlessness.   Lack of interest in activity or food.  Postpartum psychosis is a very serious condition and can be dangerous. Fortunately, it is rare. Displaying any of the following symptoms is cause for immediate medical attention. Symptoms of postpartum psychosis include:   Hallucinations and delusions.   Bizarre or disorganized behavior.   Confusion or disorientation.  DIAGNOSIS  A diagnosis is made by an evaluation of your symptoms. There are no medical or lab tests that lead to a diagnosis, but there are  various questionnaires that a health care provider may use to identify those with the baby blues, postpartum depression, or psychosis. Often, a screening tool called the [place] Postnatal Depression Scale is used to diagnose depression in the postpartum period.  TREATMENT The baby blues usually goes away on its own in 1-2 weeks. Social support is often all that is needed. You will be encouraged to get adequate sleep and rest. Occasionally, you may be given medicines to help you sleep.  Postpartum depression requires treatment because it can last several months or longer if it is not treated. Treatment may include individual or group therapy, medicine, or both to address any social, physiological, and psychological factors that may play a role in the depression. Regular exercise, a healthy diet, rest, and social support may also be strongly recommended.  Postpartum psychosis is more serious and needs treatment right away. Hospitalization is often needed. HOME CARE INSTRUCTIONS  Get as much rest as you can. Nap when the baby sleeps.   Exercise regularly. Some women find yoga and walking to be beneficial.   Eat a balanced and nourishing diet.   Do little things that you  enjoy. Have a cup of tea, take a bubble bath, read your favorite magazine, or listen to your favorite music.  Avoid alcohol.   Ask for help with household chores, cooking, grocery shopping, or running errands as needed. Do not try to do everything.   Talk to people close to you about how you are feeling. Get support from your partner, family members, friends, or other new moms.  Try to stay positive in how you think. Think about the things you are grateful for.   Do not spend a lot of time alone.   Only take over-the-counter or prescription medicine as directed by your health care provider.  Keep all your postpartum appointments.   Let your health care provider know if you have any concerns.  SEEK MEDICAL CARE  IF: You are having a reaction to or problems with your medicine. SEEK IMMEDIATE MEDICAL CARE IF:  You have suicidal feelings.   You think you may harm the baby or someone else. MAKE SURE YOU:  Understand these instructions.  Will watch your condition.  Will get help right away if you are not doing well or get worse. Document Released: 12/28/2003 Document Revised: 03/30/2013 Document Reviewed: 01/04/2013 [facility] Patient Information 2015 [place], [place]. This information is not intended to replace advice given to you by your health care provider. Make sure you discuss any questions you have with your health care provider.

## 2013-12-09 NOTE — Progress Notes (Signed)
Subjective:    Patient ID: Angela Dixon, female    DOB: 09-Jan-1986, 28 y.o.   MRN: [id number]  HPI Comments: 27 yo pleasant WF CPE. She has an 8 month healthy baby boy. She has had mild increased stress and a little more emotional since baby started daycare and she returned to work. She notes she easily cries and is having difficulty focusing with work. She denies SI/HI and thinks she is managing OK. She has not been exercising or eating healthy. She has gained weight since pregnancy.   She also notes mild diarrhea on occasion. She notes probiotics have helped some but notes increase around menstrual cycles. She denies IBS history.  She fell last week and scrapped elbow and concerned maybe infected vs allergic to neosporin. She notes redness has improved with D/C Neosporin.     WBC            20.9   04/10/2013 HGB             9.6   04/10/2013 HCT            28.6   04/10/2013 PLT             207   04/10/2013 GLUCOSE         135   05/28/2010 ALT              14   05/28/2010 AST              28   05/28/2010 NA              141   05/28/2010 K               4.1   05/28/2010 CL              110   05/28/2010 CREATININE     0.79   05/28/2010 BUN              12   05/28/2010 CO2              17   05/28/2010     Medication List       This list is accurate as of: 12/09/13 12:05 PM.  Always use your most recent med list.               cetirizine 10 MG tablet  Commonly known as:  ZYRTEC  Take 10 mg by mouth daily.     Cholecalciferol 4000 UNITS Caps  Take 1 capsule by mouth daily.     esomeprazole 40 MG capsule  Commonly known as:  NEXIUM  TAKE ONE CAPSULE BY MOUTH EVERY DAY FOR ACID REFLUX     Flax Seed Oil 1000 MG Caps  Take by mouth daily.     Magnesium 500 MG Caps  Take by mouth daily.     multivitamin with minerals Tabs tablet  Take 1 tablet by mouth daily.     PROBIOTIC DAILY PO  Take by mouth.       Allergies  Allergen Reactions  . Neomycin    Past Medical History   Diagnosis Date  . Abnormal Pap smear   . Hypercholesterolemia   . Fracture   . Acid reflux   . Chlamydia 2006  . Vitamin D deficiency   . Anemia    Past Surgical History  Procedure Laterality Date  . Wisdom tooth extraction    . Colposcopy  2006   Family History  Problem Relation Age  of Onset  . Hyperlipidemia Mother   . Asthma Sister   . Hypertension Maternal Grandmother   . Diabetes Maternal Grandmother   . Heart disease Maternal Grandfather   . Cancer Paternal Grandmother     throat  . Cancer Paternal Grandfather     lung   MAINTENANCE: Pap/ Pelvic:05/2013 at GYN WNL EYE:7/2015[facility] Dentist:Q 4 month, 11/2013  IMMUNIZATIONS: Tdap:11/19/10 Influenza:  Patient Care Team: Unk Pinto, MD as PCP - General (Internal Medicine) Daria Pastures, MD as Consulting Physician (Obstetrics and Gynecology) Donnel Saxon, CNM as Midwife (Obstetrics and Gynecology) Dyke Maes, OD (Optometry) Lavone Neri, (Dentist)   Review of Systems  Gastrointestinal: Positive for diarrhea.  Psychiatric/Behavioral: The patient is nervous/anxious.   All other systems reviewed and are negative.  BP 116/80  Pulse 82  Temp(Src) 98 F (36.7 C) (Temporal)  Resp 16  Wt 229 lb (103.874 kg)  LMP 11/17/2013     Objective:   Physical Exam  Nursing note and vitals reviewed. Constitutional: She is oriented to person, place, and time. She appears well-developed and well-nourished. No distress.  HENT:  Head: Normocephalic and atraumatic.  Right Ear: External ear normal.  Left Ear: External ear normal.  Nose: Nose normal.  Mouth/Throat: Oropharynx is clear and moist.  Eyes: Conjunctivae and EOM are normal. Pupils are equal, round, and reactive to light. Right eye exhibits no discharge. Left eye exhibits no discharge. No scleral icterus.  Neck: Normal range of motion. Neck supple. No JVD present. No tracheal deviation present. No thyromegaly present.  Cardiovascular: Normal rate, regular  rhythm, normal heart sounds and intact distal pulses.   Pulmonary/Chest: Effort normal and breath sounds normal.  Abdominal: Soft. Bowel sounds are normal. She exhibits no distension and no mass. There is no tenderness. There is no rebound and no guarding.  Genitourinary:  Def gyn  Musculoskeletal: Normal range of motion. She exhibits no edema and no tenderness.  Lymphadenopathy:    She has no cervical adenopathy.  Neurological: She is alert and oriented to person, place, and time. She has normal reflexes. No cranial nerve deficit. She exhibits normal muscle tone. Coordination normal.  Skin: Skin is warm and dry. No rash noted. No erythema. No pallor.  -Midline back, under left breast 3-4 mm fleshy elevated moles with pigment variation. No change per pt. -Left Elbow nickel size abrasion without infection  Psychiatric: She has a normal mood and affect. Her behavior is normal. Judgment and thought content normal.  Tearful but appropriate    EKG NSCSPT WNL     Assessment & Plan:  1. CPE- Update screening labs/ History/ Immunizations/ Testing as needed. Advised healthy diet, QD exercise, increase H20 and continue RX/ Vitamins AD.  2. Post partum Depression- Advised counseling, increase activity, start Zoloft 50 mg prescriptions AD. (1/2 x 3 days with food and increase folic acid intake, then increase to whole tab). Advised follow if any concerns ER if symptoms increase  3.  Diarrhea- Continue probiotic, bland diet x 2 weeks, advise if no change with adding Zoloft F/U Gyn if no change, w/c if SX increase or ER.   4. ? Mole abnormality- advise close monitoring w/c with any concerns

## 2013-12-10 LAB — BASIC METABOLIC PANEL WITH GFR
BUN: 13 mg/dL (ref 6–23)
CHLORIDE: 103 meq/L (ref 96–112)
CO2: 23 mEq/L (ref 19–32)
Calcium: 9.5 mg/dL (ref 8.4–10.5)
Creat: 0.5 mg/dL (ref 0.50–1.10)
GFR, Est African American: >89 mL/min
Glucose, Bld: 92 mg/dL (ref 70–99)
POTASSIUM: 4.2 meq/L (ref 3.5–5.3)
SODIUM: 137 meq/L (ref 135–145)

## 2013-12-10 LAB — URINALYSIS, ROUTINE W REFLEX MICROSCOPIC
Bilirubin Urine: NEGATIVE
GLUCOSE, UA: NEGATIVE mg/dL
HGB URINE DIPSTICK: NEGATIVE
Ketones, ur: NEGATIVE mg/dL
Leukocytes, UA: NEGATIVE
Nitrite: NEGATIVE
PH: 6.5 (ref 5.0–8.0)
PROTEIN: NEGATIVE mg/dL
Specific Gravity, Urine: 1.006 (ref 1.005–1.030)
Urobilinogen, UA: 0.2 mg/dL (ref 0.0–1.0)

## 2013-12-10 LAB — IRON AND TIBC
%SAT: 11 % — AB (ref 20–55)
IRON: 56 ug/dL (ref 42–145)
TIBC: 489 ug/dL — AB (ref 250–470)
UIBC: 433 ug/dL — ABNORMAL HIGH (ref 125–400)

## 2013-12-10 LAB — HEPATIC FUNCTION PANEL
ALT: 12 U/L (ref 0–35)
AST: 18 U/L (ref 0–37)
Albumin: 4.7 g/dL (ref 3.5–5.2)
Alkaline Phosphatase: 79 U/L (ref 39–117)
Bilirubin, Direct: 0.1 mg/dL (ref 0.0–0.3)
Indirect Bilirubin: 0.2 mg/dL (ref 0.2–1.2)
Total Bilirubin: 0.3 mg/dL (ref 0.2–1.2)
Total Protein: 7.6 g/dL (ref 6.0–8.3)

## 2013-12-10 LAB — MAGNESIUM: Magnesium: 2 mg/dL (ref 1.5–2.5)

## 2013-12-10 LAB — TSH: TSH: 1.983 u[IU]/mL (ref 0.350–4.500)

## 2013-12-10 LAB — LIPID PANEL
CHOLESTEROL: 218 mg/dL — AB (ref 0–200)
HDL: 69 mg/dL (ref >39)
LDL Cholesterol: 116 mg/dL — ABNORMAL HIGH (ref 0–99)
Total CHOL/HDL Ratio: 3.2 Ratio
Triglycerides: 166 mg/dL — ABNORMAL HIGH (ref <150)
VLDL: 33 mg/dL (ref 0–40)

## 2013-12-10 LAB — MICROALBUMIN / CREATININE URINE RATIO
CREATININE, URINE: 34 mg/dL
MICROALB/CREAT RATIO: 14.7 mg/g (ref 0.0–30.0)
Microalb, Ur: 0.5 mg/dL (ref 0.00–1.89)

## 2013-12-10 LAB — VITAMIN D 25 HYDROXY (VIT D DEFICIENCY, FRACTURES): VIT D 25 HYDROXY: 43 ng/mL (ref 30–89)

## 2013-12-10 LAB — INSULIN, FASTING: INSULIN FASTING, SERUM: 13.7 u[IU]/mL (ref 2.0–19.6)

## 2013-12-10 LAB — VITAMIN B12: Vitamin B-12: 743 pg/mL (ref 211–911)

## 2013-12-10 LAB — FOLATE RBC: RBC Folate: 774 ng/mL (ref >280)

## 2013-12-13 LAB — TB SKIN TEST
Induration: 0 mm
TB Skin Test: NEGATIVE

## 2013-12-17 ENCOUNTER — Telehealth: Payer: Self-pay | Admitting: *Deleted

## 2013-12-17 MED ORDER — ALPRAZOLAM 1 MG PO TABS: ORAL_TABLET | ORAL | Status: DC

## 2013-12-17 NOTE — Telephone Encounter (Signed)
Patient called and states since starting Zoloft 50 mg, she is having trouble sleeping and sweating at night.  Per Dr Melford Aase, patient can reduce Zoloft to 1/2 tab(25 mg) and RX sent for Xanax for sleep per Dr Melford Aase.

## 2014-02-07 ENCOUNTER — Encounter: Payer: Self-pay | Admitting: Emergency Medicine

## 2014-02-07 ENCOUNTER — Other Ambulatory Visit: Payer: Self-pay | Admitting: *Deleted

## 2014-02-07 DIAGNOSIS — F329 Major depressive disorder, single episode, unspecified: Secondary | ICD-10-CM

## 2014-02-07 DIAGNOSIS — F32A Depression, unspecified: Secondary | ICD-10-CM

## 2014-02-07 MED ORDER — SERTRALINE HCL 50 MG PO TABS: 50.0000 mg | ORAL_TABLET | Freq: Every day | ORAL | Status: DC

## 2014-02-08 ENCOUNTER — Other Ambulatory Visit: Payer: Self-pay

## 2014-02-08 ENCOUNTER — Encounter: Payer: Self-pay | Admitting: Physician Assistant

## 2014-02-08 ENCOUNTER — Ambulatory Visit (INDEPENDENT_AMBULATORY_CARE_PROVIDER_SITE_OTHER): Payer: Managed Care, Other (non HMO) | Admitting: Physician Assistant

## 2014-02-08 VITALS — BP 126/70 | HR 92 | Temp 98.2°F | Resp 16 | Ht 65.0 in | Wt 232.0 lb

## 2014-02-08 DIAGNOSIS — F32A Depression, unspecified: Secondary | ICD-10-CM | POA: Insufficient documentation

## 2014-02-08 DIAGNOSIS — H1011 Acute atopic conjunctivitis, right eye: Secondary | ICD-10-CM

## 2014-02-08 DIAGNOSIS — F411 Generalized anxiety disorder: Secondary | ICD-10-CM | POA: Insufficient documentation

## 2014-02-08 DIAGNOSIS — F329 Major depressive disorder, single episode, unspecified: Secondary | ICD-10-CM

## 2014-02-08 DIAGNOSIS — G479 Sleep disorder, unspecified: Secondary | ICD-10-CM

## 2014-02-08 MED ORDER — ALPRAZOLAM 1 MG PO TABS: ORAL_TABLET | ORAL | Status: DC

## 2014-02-08 MED ORDER — ESOMEPRAZOLE MAGNESIUM 40 MG PO CPDR: 40.0000 mg | DELAYED_RELEASE_CAPSULE | Freq: Every day | ORAL | Status: DC

## 2014-02-08 NOTE — Progress Notes (Addendum)
Subjective:    Patient ID: Angela Dixon, female    DOB: 1986/01/22, 28 y.o.   MRN: [id number]  Eye Problem  The right eye is affected. This is a new problem. The current episode started today. The problem has been resolved. There was no injury mechanism. The pain is at a severity of 0/10. The patient is experiencing no pain. Known exposure: The only one sick right now is her son who has hand, foot and mouth disease. She does not wear contacts (wears glasses but never contacts). Associated symptoms include an eye discharge. Pertinent negatives include no blurred vision, double vision, eye redness, fever, foreign body sensation, itching, nausea, photophobia, recent URI or vomiting. Associated symptoms comments: Patient only had discharge/crusting this morning and states it "feels different".. She has tried nothing for the symptoms. The treatment provided mild relief.  Sore Throat  This is a new problem. The current episode started today. The problem has been unchanged. There has been no fever. The pain is at a severity of 0/10. The patient is experiencing no pain. Associated symptoms include congestion. Pertinent negatives include no abdominal pain, coughing, diarrhea, drooling, ear discharge, ear pain, headaches, hoarse voice, plugged ear sensation, neck pain, shortness of breath, stridor, swollen glands, trouble swallowing or vomiting. Associated symptoms comments: Nasal congestion and thinks that is from allergies.. She has tried nothing for the symptoms.    Depression, Anxiety and Trouble Sleeping-Patient states she has been on Zoloft for two months.  She reports excessive sweating and trouble sleeping, so Dr. Melford Aase called in Xanax for her to take at bedtime.  Last filled 12/17/13.  Patient states she still has some left and does not want to run out.  She states they are helping her sleep.  Patient is not breast feeding son and is not using birth control at this time.  Told patient to stop taking Xanax  if she thinks she could be pregnant.  LMP is 02/06/14.  Patient states the Zoloft is helping her.  Review of Systems  Constitutional: Negative.  Negative for fever, chills and fatigue.  HENT: Positive for congestion and sore throat. Negative for drooling, ear discharge, ear pain, hoarse voice, postnasal drip, rhinorrhea, sinus pressure and trouble swallowing.   Eyes: Positive for discharge. Negative for blurred vision, double vision, photophobia, pain, redness, itching and visual disturbance.  Respiratory: Negative.  Negative for cough, chest tightness, shortness of breath, wheezing and stridor.   Cardiovascular: Negative.  Negative for chest pain.  Gastrointestinal: Negative.  Negative for nausea, vomiting, abdominal pain and diarrhea.  Genitourinary: Negative.   Musculoskeletal: Negative.  Negative for neck pain.  Skin: Negative.  Negative for itching and rash.  Neurological: Negative.  Negative for headaches.  Psychiatric/Behavioral: Positive for sleep disturbance. The patient is nervous/anxious.    Past Medical History  Diagnosis Date  . Abnormal Pap smear   . Hypercholesterolemia   . Fracture   . Acid reflux   . Chlamydia 2006  . Vitamin D deficiency   . Anemia    Current Outpatient Prescriptions on File Prior to Visit  Medication Sig Dispense Refill  . cetirizine (ZYRTEC) 10 MG tablet Take 10 mg by mouth daily.    . Cholecalciferol 4000 UNITS CAPS Take 1 capsule by mouth daily.    . Flaxseed, Linseed, (FLAX SEED OIL) 1000 MG CAPS Take by mouth daily.    . Magnesium 500 MG CAPS Take by mouth daily.    . Multiple Vitamin (MULTIVITAMIN WITH MINERALS) TABS tablet Take 1 tablet  by mouth daily.    . Probiotic Product (PROBIOTIC DAILY PO) Take by mouth.    . sertraline (ZOLOFT) 50 MG tablet Take 1 tablet (50 mg total) by mouth daily. 90 tablet 0   No current facility-administered medications on file prior to visit.   Allergies  Allergen Reactions  . Neomycin      Temp(Src) 98.2  F (36.8 C) (Temporal)  Resp 16  Ht 5\' 5"  (1.651 m)  Wt 232 lb (105.235 kg)  BMI 38.61 kg/m2  LMP 02/06/2014 (Exact Date) Objective:   Physical Exam  Constitutional: She is oriented to person, place, and time. She appears well-developed and well-nourished. She does not have a sickly appearance. She does not appear ill. No distress.  HENT:  Head: Normocephalic and atraumatic. Head is without right periorbital erythema and without left periorbital erythema.  Right Ear: External ear and ear canal normal. No drainage, swelling or tenderness. Tympanic membrane is bulging. Tympanic membrane is not injected, not scarred, not perforated, not erythematous and not retracted. A middle ear effusion is present.  Left Ear: External ear and ear canal normal. No drainage, swelling or tenderness. Tympanic membrane is bulging. Tympanic membrane is not injected, not scarred, not perforated, not erythematous and not retracted. A middle ear effusion is present.  Nose: Nose normal. No mucosal edema, rhinorrhea or sinus tenderness. Right sinus exhibits no maxillary sinus tenderness and no frontal sinus tenderness. Left sinus exhibits no maxillary sinus tenderness and no frontal sinus tenderness.  Mouth/Throat: Uvula is midline, oropharynx is clear and moist and mucous membranes are normal. Mucous membranes are not pale and not dry. No uvula swelling. No oropharyngeal exudate, posterior oropharyngeal edema, posterior oropharyngeal erythema or tonsillar abscesses.  TMs are bulging and not erythematous or edematous bilaterally.  TMs show normal cone of light bilaterally.  Clear fluid behind TMs bilaterally.    Turbinates are pale bilaterally and without erythema or edema.  Eyes: Conjunctivae, EOM and lids are normal. Pupils are equal, round, and reactive to light. Right eye exhibits no discharge, no exudate and no hordeolum. No foreign body present in the right eye. Left eye exhibits no discharge, no exudate and no  hordeolum. No foreign body present in the left eye. Right conjunctiva is not injected. Right conjunctiva has no hemorrhage. Left conjunctiva is not injected. Left conjunctiva has no hemorrhage. No scleral icterus.  Neck: Trachea normal and normal range of motion. Neck supple. No tracheal tenderness present. No tracheal deviation present.  Cardiovascular: Normal rate, regular rhythm, S1 normal, S2 normal, normal heart sounds, intact distal pulses and normal pulses.  Exam reveals no gallop, no distant heart sounds and no friction rub.   No murmur heard. Pulmonary/Chest: Effort normal and breath sounds normal. No accessory muscle usage or stridor. No tachypnea and no bradypnea. No respiratory distress. She has no decreased breath sounds. She has no wheezes. She has no rhonchi. She has no rales. She exhibits no tenderness.  Musculoskeletal: Normal range of motion.  Lymphadenopathy:       Head (right side): No submental, no submandibular, no tonsillar, no preauricular, no posterior auricular and no occipital adenopathy present.       Head (left side): No submental, no submandibular, no tonsillar, no preauricular, no posterior auricular and no occipital adenopathy present.    She has no cervical adenopathy.       Right: No supraclavicular adenopathy present.       Left: No supraclavicular adenopathy present.  Neurological: She is alert and oriented to  person, place, and time. She has normal strength.  Skin: Skin is warm, dry and intact. No rash noted. She is not diaphoretic.  Psychiatric: She has a normal mood and affect. Her speech is normal and behavior is normal. Judgment and thought content normal. Cognition and memory are normal.  Vitals reviewed.     Assessment & Plan:  1. Allergic conjunctivitis, right -Continue the Zyrtec for allergies -Try cold compresses on closed right eye for relief. -While drinking fluids, pinch and hold nose closed and swallow.  This will help open eustachian tubes to  drain the fluid behind ear drums.  -Gave patient information on allergic conjunctivitis. -If you are not better in 7-10 days or see more discharge or pus, then call the office so I can send in antibiotic to pharmacy.  2. Depression Continue Zoloft and Xanax as prescribed.  Patient states the Zoloft is working for Depression at this time.   - ALPRAZolam (XANAX) 1 MG tablet; Take 1/2 to 1 tablet by mouth at bedtime for sleep.  Dispense: 30 tablet; Refill: 0  3. Generalized anxiety disorder Continue Zoloft and Xanax as prescribed.  Told patient that Xanax will only be temporary prescription and if she is still having anxiety then we may need to increase Zoloft dosage. - ALPRAZolam (XANAX) 1 MG tablet; Take 1/2 to 1 tablet by mouth at bedtime for sleep.  Dispense: 30 tablet; Refill: 0  4. Trouble in sleeping Continue the Xanax as prescribed.  Told patient this will only be temporary prescription and to try to make the xanax last and only use it if she needs it.  Gave patient information about insomnia. -Told pt to stop Xanax if she thinks she could be pregnant. - ALPRAZolam (XANAX) 1 MG tablet; Take 1/2 to 1 tablet by mouth at bedtime for sleep.  Dispense: 30 tablet; Refill: 0  Patient has follow up appointment on 04/19/14 with [name], PA-C.  Patient states she may have to come in sooner due to her insurance changing and having a high deductible on the new insurance.  Patent was thinking December 2015 and that should be OK.  Told pt to call the office if she has any questions or concerns.  Addendum: Discussed medication effects and SE's.  Pt agreed to treatment plan.   Izaac Reisig, Stephani Police, PA-C 10:33 AM [facility]

## 2014-02-08 NOTE — Patient Instructions (Addendum)
-continue Zyrtec for allergies -While drinking fluids, pinch and hold nose close and swallow to open eustachian tube. -Try cold compress on right eye for relief.   -If not getting better in 7-10 days or more discharge, call the office and I can send in antibiotic.  Take Xanax as prescribed for sleep. Stop xanax if you think you could be pregnant.  Continue Zoloft as prescribed.  Insomnia Insomnia is frequent trouble falling and/or staying asleep. Insomnia can be a long term problem or a short term problem. Both are common. Insomnia can be a short term problem when the wakefulness is related to a certain stress or worry. Long term insomnia is often related to ongoing stress during waking hours and/or poor sleeping habits. Overtime, sleep deprivation itself can make the problem worse. Every little thing feels more severe because you are overtired and your ability to cope is decreased. CAUSES   Stress, anxiety, and depression.  Poor sleeping habits.  Distractions such as TV in the bedroom.  Naps close to bedtime.  Engaging in emotionally charged conversations before bed.  Technical reading before sleep.  Alcohol and other sedatives. They may make the problem worse. They can hurt normal sleep patterns and normal dream activity.  Stimulants such as caffeine for several hours prior to bedtime.  Pain syndromes and shortness of breath can cause insomnia.  Exercise late at night.  Changing time zones may cause sleeping problems (jet lag). It is sometimes helpful to have someone observe your sleeping patterns. They should look for periods of not breathing during the night (sleep apnea). They should also look to see how long those periods last. If you live alone or observers are uncertain, you can also be observed at a sleep clinic where your sleep patterns will be professionally monitored. Sleep apnea requires a checkup and treatment. Give your caregivers your medical history. Give your  caregivers observations your family has made about your sleep.  SYMPTOMS   Not feeling rested in the morning.  Anxiety and restlessness at bedtime.  Difficulty falling and staying asleep. TREATMENT   Your caregiver may prescribe treatment for an underlying medical disorders. Your caregiver can give advice or help if you are using alcohol or other drugs for self-medication. Treatment of underlying problems will usually eliminate insomnia problems.  Medications can be prescribed for short time use. They are generally not recommended for lengthy use.  Over-the-counter sleep medicines are not recommended for lengthy use. They can be habit forming.  You can promote easier sleeping by making lifestyle changes such as:  Using relaxation techniques that help with breathing and reduce muscle tension.  Exercising earlier in the day.  Changing your diet and the time of your last meal. No night time snacks.  Establish a regular time to go to bed.  Counseling can help with stressful problems and worry.  Soothing music and white noise may be helpful if there are background noises you cannot remove.  Stop tedious detailed work at least one hour before bedtime. HOME CARE INSTRUCTIONS   Keep a diary. Inform your caregiver about your progress. This includes any medication side effects. See your caregiver regularly. Take note of:  Times when you are asleep.  Times when you are awake during the night.  The quality of your sleep.  How you feel the next day. This information will help your caregiver care for you.  Get out of bed if you are still awake after 15 minutes. Read or do some quiet activity. Keep  the lights down. Wait until you feel sleepy and go back to bed.  Keep regular sleeping and waking hours. Avoid naps.  Exercise regularly.  Avoid distractions at bedtime. Distractions include watching television or engaging in any intense or detailed activity like attempting to balance  the household checkbook.  Develop a bedtime ritual. Keep a familiar routine of bathing, brushing your teeth, climbing into bed at the same time each night, listening to soothing music. Routines increase the success of falling to sleep faster.  Use relaxation techniques. This can be using breathing and muscle tension release routines. It can also include visualizing peaceful scenes. You can also help control troubling or intruding thoughts by keeping your mind occupied with boring or repetitive thoughts like the old concept of counting sheep. You can make it more creative like imagining planting one beautiful flower after another in your backyard garden.  During your day, work to eliminate stress. When this is not possible use some of the previous suggestions to help reduce the anxiety that accompanies stressful situations. MAKE SURE YOU:   Understand these instructions.  Will watch your condition.  Will get help right away if you are not doing well or get worse. Document Released: 03/22/2000 Document Revised: 06/17/2011 Document Reviewed: 04/22/2007 [facility] Patient Information 2015 [place], [place]. This information is not intended to replace advice given to you by your health care provider. Make sure you discuss any questions you have with your health care provider.  Allergic Conjunctivitis The conjunctiva is a thin membrane that covers the visible white part of the eyeball and the underside of the eyelids. This membrane protects and lubricates the eye. The membrane has small blood vessels running through it that can normally be seen. When the conjunctiva becomes inflamed, the condition is called conjunctivitis. In response to the inflammation, the conjunctival blood vessels become swollen. The swelling results in redness in the normally white part of the eye. The blood vessels of this membrane also react when a person has allergies and is then called allergic conjunctivitis. This condition  usually lasts for as long as the allergy persists. Allergic conjunctivitis cannot be passed to another person (non-contagious). The likelihood of bacterial infection is great and the cause is not likely due to allergies if the inflamed eye has:  A sticky discharge.  Discharge or sticking together of the lids in the morning.  Scaling or flaking of the eyelids where the eyelashes come out.  Red swollen eyelids. CAUSES   Viruses.  Irritants such as foreign bodies.  Chemicals.  General allergic reactions.  Inflammation or serious diseases in the inside or the outside of the eye or the orbit (the boney cavity in which the eye sits) can cause a "red eye." SYMPTOMS   Eye redness.  Tearing.  Itchy eyes.  Burning feeling in the eyes.  Clear drainage from the eye.  Allergic reaction due to pollens or ragweed sensitivity. Seasonal allergic conjunctivitis is frequent in the spring when pollens are in the air and in the fall. DIAGNOSIS  This condition, in its many forms, is usually diagnosed based on the history and an ophthalmological exam. It usually involves both eyes. If your eyes react at the same time every year, allergies may be the cause. While most "red eyes" are due to allergy or an infection, the role of an eye (ophthalmological) exam is important. The exam can rule out serious diseases of the eye or orbit. TREATMENT   Non-antibiotic eye drops, ointments, or medications by mouth may  be prescribed if the ophthalmologist is sure the conjunctivitis is due to allergies alone.  Over-the-counter drops and ointments for allergic symptoms should be used only after other causes of conjunctivitis have been ruled out, or as your caregiver suggests. Medications by mouth are often prescribed if other allergy-related symptoms are present. If the ophthalmologist is sure that the conjunctivitis is due to allergies alone, treatment is normally limited to drops or ointments to reduce itching and  burning. HOME CARE INSTRUCTIONS   Wash hands before and after applying drops or ointments, or touching the inflamed eye(s) or eyelids.  Do not let the eye dropper tip or ointment tube touch the eyelid when putting medicine in your eye.  Stop using your soft contact lenses and throw them away. Use a new pair of lenses when recovery is complete. You should run through sterilizing cycles at least three times before use after complete recovery if the old soft contact lenses are to be used. Hard contact lenses should be stopped. They need to be thoroughly sterilized before use after recovery.  Itching and burning eyes due to allergies is often relieved by using a cool cloth applied to closed eye(s). SEEK MEDICAL CARE IF:   Your problems do not go away after two or three days of treatment.  Your lids are sticky (especially in the morning when you wake up) or stick together.  Discharge develops. Antibiotics may be needed either as drops, ointment, or by mouth.  You have extreme light sensitivity.  An oral temperature above 102 F (38.9 C) develops.  Pain in or around the eye or any other visual symptom develops. MAKE SURE YOU:   Understand these instructions.  Will watch your condition.  Will get help right away if you are not doing well or get worse. Document Released: 06/15/2002 Document Revised: 06/17/2011 Document Reviewed: 05/11/2007 [facility] Patient Information 2015 [place], [place]. This information is not intended to replace advice given to you by your health care provider. Make sure you discuss any questions you have with your health care provider.

## 2014-02-08 NOTE — Addendum Note (Signed)
Addended by: Charolette Forward on: 02/08/2014 04:43 PM   Modules accepted: Miquel Dunn

## 2014-02-15 ENCOUNTER — Telehealth: Payer: Self-pay | Admitting: Physician Assistant

## 2014-02-15 ENCOUNTER — Other Ambulatory Visit: Payer: Self-pay | Admitting: Physician Assistant

## 2014-02-15 DIAGNOSIS — J0100 Acute maxillary sinusitis, unspecified: Secondary | ICD-10-CM

## 2014-02-15 MED ORDER — AZITHROMYCIN 250 MG PO TABS: ORAL_TABLET | ORAL | Status: DC

## 2014-02-15 MED ORDER — FLUTICASONE PROPIONATE 50 MCG/ACT NA SUSP: 2.0000 | Freq: Every day | NASAL | Status: DC

## 2014-02-15 MED ORDER — PREDNISONE 10 MG PO TABS: ORAL_TABLET | ORAL | Status: DC

## 2014-02-15 NOTE — Telephone Encounter (Signed)
Patient called stating that her ears are not feeling better and now has sinus headaches and congestion.  I will send in Z-Pak and prednisone taper to pharmacy ([facility]).

## 2014-02-25 ENCOUNTER — Telehealth: Payer: Self-pay | Admitting: Physician Assistant

## 2014-02-25 ENCOUNTER — Ambulatory Visit (HOSPITAL_COMMUNITY)
Admission: RE | Admit: 2014-02-25 | Discharge: 2014-02-25 | Disposition: A | Discharge status: Home / Self Care | Payer: Managed Care, Other (non HMO) | Source: Ambulatory Visit | Attending: Physician Assistant | Admitting: Physician Assistant

## 2014-02-25 ENCOUNTER — Other Ambulatory Visit: Payer: Self-pay | Admitting: Physician Assistant

## 2014-02-25 DIAGNOSIS — R5383 Other fatigue: Secondary | ICD-10-CM | POA: Diagnosis not present

## 2014-02-25 DIAGNOSIS — R059 Cough, unspecified: Secondary | ICD-10-CM

## 2014-02-25 DIAGNOSIS — R053 Chronic cough: Secondary | ICD-10-CM

## 2014-02-25 DIAGNOSIS — R05 Cough: Secondary | ICD-10-CM

## 2014-02-25 MED ORDER — PROMETHAZINE-DM 6.25-15 MG/5ML PO SYRP: 5.0000 mL | ORAL_SOLUTION | Freq: Four times a day (QID) | ORAL | Status: DC | PRN

## 2014-02-25 NOTE — Telephone Encounter (Signed)
Patient called stating she is still feeling sick and having chest cough, body aches, some dizziness and feels both ears are clogged.  Spoke with pt at 1213 on 11/201/5 and she stated that she is having cough, headaches, shakes, ear congestion.  Gave pt 02/08/14- Zyrtec for allergies, xanax for anxiety, and refill on Zoloft. Gave pt 11/101/5- Z-Pak, prednisone, and Flonase.  02[facility]25/14[facility] and sent in Promethazine-DM for cough.  I will also tell her to try allegra instead of zyrtec.  Told pt that if she is not better by next week to come in for appt.    Jillian Pianka, Stephani Police, PA-C 6:20 PM [facility]

## 2014-02-25 NOTE — Addendum Note (Signed)
Addended by: Charolette Forward on: 02/25/2014 07:06 PM   Modules accepted: Miquel Dunn

## 2014-03-08 ENCOUNTER — Encounter: Payer: Self-pay | Admitting: Physician Assistant

## 2014-03-08 ENCOUNTER — Ambulatory Visit (INDEPENDENT_AMBULATORY_CARE_PROVIDER_SITE_OTHER): Payer: Managed Care, Other (non HMO) | Admitting: Physician Assistant

## 2014-03-08 VITALS — BP 126/82 | HR 84 | Temp 98.2°F | Resp 18 | Ht 65.0 in | Wt 236.0 lb

## 2014-03-08 DIAGNOSIS — J0100 Acute maxillary sinusitis, unspecified: Secondary | ICD-10-CM

## 2014-03-08 DIAGNOSIS — Z3490 Encounter for supervision of normal pregnancy, unspecified, unspecified trimester: Secondary | ICD-10-CM

## 2014-03-08 DIAGNOSIS — Z331 Pregnant state, incidental: Secondary | ICD-10-CM

## 2014-03-08 NOTE — Patient Instructions (Addendum)
-Take Mucinex or Robitussin OTC for cough. (Choose one) - Take Zyrtec OTC for allergies and to help with fluid behind ear drums.   -While drinking fluids, pinch and hold nose close and swallow.  This will help open up your eustachian tubes to drain the fluid behind your ear drums. -Try steam showers to open your nasal passages.  Drink lots of water to stay hydrated and to thin mucous. -Normal saline nasal spray  -Continue to NOT take the xanax or promethazine-DM, or flonase -It can take up to 2 weeks to feel better.  Sinusitis is mostly caused by viruses.   Sinusitis Sinusitis is redness, soreness, and inflammation of the paranasal sinuses. Paranasal sinuses are air pockets within the bones of your face (beneath the eyes, the middle of the forehead, or above the eyes). In healthy paranasal sinuses, mucus is able to drain out, and air is able to circulate through them by way of your nose. However, when your paranasal sinuses are inflamed, mucus and air can become trapped. This can allow bacteria and other germs to grow and cause infection. Sinusitis can develop quickly and last only a short time (acute) or continue over a long period (chronic). Sinusitis that lasts for more than 12 weeks is considered chronic.  CAUSES  Causes of sinusitis include:  Allergies.  Structural abnormalities, such as displacement of the cartilage that separates your nostrils (deviated septum), which can decrease the air flow through your nose and sinuses and affect sinus drainage.  Functional abnormalities, such as when the small hairs (cilia) that line your sinuses and help remove mucus do not work properly or are not present. SIGNS AND SYMPTOMS  Symptoms of acute and chronic sinusitis are the same. The primary symptoms are pain and pressure around the affected sinuses. Other symptoms include:  Upper toothache.  Earache.  Headache.  Bad breath.  Decreased sense of smell and taste.  A cough, which worsens  when you are lying flat.  Fatigue.  Fever.  Thick drainage from your nose, which often is green and may contain pus (purulent).  Swelling and warmth over the affected sinuses. DIAGNOSIS  Your health care provider will perform a physical exam. During the exam, your health care provider may:  Look in your nose for signs of abnormal growths in your nostrils (nasal polyps).  Tap over the affected sinus to check for signs of infection.  View the inside of your sinuses (endoscopy) using an imaging device that has a light attached (endoscope). If your health care provider suspects that you have chronic sinusitis, one or more of the following tests may be recommended:  Allergy tests.  Nasal culture. A sample of mucus is taken from your nose, sent to a lab, and screened for bacteria.  Nasal cytology. A sample of mucus is taken from your nose and examined by your health care provider to determine if your sinusitis is related to an allergy. TREATMENT  Most cases of acute sinusitis are related to a viral infection and will resolve on their own within 10 days. Sometimes medicines are prescribed to help relieve symptoms (pain medicine, decongestants, nasal steroid sprays, or saline sprays).  However, for sinusitis related to a bacterial infection, your health care provider will prescribe antibiotic medicines. These are medicines that will help kill the bacteria causing the infection.  Rarely, sinusitis is caused by a fungal infection. In theses cases, your health care provider will prescribe antifungal medicine. For some cases of chronic sinusitis, surgery is needed. Generally, these  are cases in which sinusitis recurs more than 3 times per year, despite other treatments. HOME CARE INSTRUCTIONS   Drink plenty of water. Water helps thin the mucus so your sinuses can drain more easily.  Use a humidifier.  Inhale steam 3 to 4 times a day (for example, sit in the bathroom with the shower  running).  Apply a warm, moist washcloth to your face 3 to 4 times a day, or as directed by your health care provider.  Use saline nasal sprays to help moisten and clean your sinuses.  Take medicines only as directed by your health care provider.  If you were prescribed either an antibiotic or antifungal medicine, finish it all even if you start to feel better. SEEK IMMEDIATE MEDICAL CARE IF:  You have increasing pain or severe headaches.  You have nausea, vomiting, or drowsiness.  You have swelling around your face.  You have vision problems.  You have a stiff neck.  You have difficulty breathing. MAKE SURE YOU:   Understand these instructions.  Will watch your condition.  Will get help right away if you are not doing well or get worse. Document Released: 03/25/2005 Document Revised: 08/09/2013 Document Reviewed: 04/09/2011 [facility] Patient Information 2015 [place], [place]. This information is not intended to replace advice given to you by your health care provider. Make sure you discuss any questions you have with your health care provider.

## 2014-03-08 NOTE — Progress Notes (Signed)
Subjective:    Patient ID: Angela Dixon, female    DOB: 1985-05-24, 28 y.o.   MRN: [id number]  Cough This is a recurrent problem. Episode onset: Last seen on 02/08/14. The problem has been gradually improving. The problem occurs constantly. Cough characteristics: Productive with yellow sputum  Associated symptoms include headaches, a sore throat and wheezing. Pertinent negatives include no chills, ear pain, fever, rash or rhinorrhea. Treatments tried: Patient was told to take Zyrtec at visit on 02/08/14.  She was then given Z-Pak, prednisone and flonase on 02/15/14.  Patient had chest x-ray done on 02/25/14 and that came back negative/normal.  Patient was given promethazine-DM for cough on 02/25/14.   Improvement on treatment: Patient states she did take a little bit of promethazine-DM for cough and states it did not help. Patient just found out she is pregnant from home pregnancy test.  Her last LMP was 02/06/14.  She states she stopped taking the xanax and only taken Tylenol and prenatal vitamin OTC.  Patient states she has appt with [facility].   Review of Systems  Constitutional: Positive for diaphoresis. Negative for fever, chills and fatigue.  HENT: Positive for sore throat. Negative for ear discharge, ear pain, rhinorrhea, sinus pressure, trouble swallowing and voice change.   Eyes: Negative.   Respiratory: Positive for cough and wheezing. Negative for chest tightness.   Cardiovascular: Negative.        Chest pain with cough only  Gastrointestinal: Positive for diarrhea. Negative for nausea, vomiting, abdominal pain and constipation.  Skin: Negative.  Negative for rash.  Neurological: Positive for headaches. Negative for dizziness and light-headedness.   Past Medical History  Diagnosis Date  . Abnormal Pap smear   . Hypercholesterolemia   . Fracture   . Acid reflux   . Chlamydia 2006  . Vitamin D deficiency   . Anemia    Current Outpatient Prescriptions on  File Prior to Visit  Medication Sig Dispense Refill  . cetirizine (ZYRTEC) 10 MG tablet Take 10 mg by mouth daily.    . Cholecalciferol 4000 UNITS CAPS Take 1 capsule by mouth daily.    Marland Kitchen esomeprazole (NEXIUM) 40 MG capsule Take 1 capsule (40 mg total) by mouth daily. 90 capsule 3  . Flaxseed, Linseed, (FLAX SEED OIL) 1000 MG CAPS Take by mouth daily.    . Magnesium 500 MG CAPS Take by mouth daily.    . Multiple Vitamin (MULTIVITAMIN WITH MINERALS) TABS tablet Take 1 tablet by mouth daily.    . Probiotic Product (PROBIOTIC DAILY PO) Take by mouth.    . sertraline (ZOLOFT) 50 MG tablet Take 1 tablet (50 mg total) by mouth daily. 90 tablet 0   No current facility-administered medications on file prior to visit.   Allergies  Allergen Reactions  . Neomycin      BP 126/82 mmHg  Pulse 84  Temp(Src) 98.2 F (36.8 C) (Temporal)  Resp 18  Ht 5\' 5"  (1.651 m)  Wt 236 lb (107.049 kg)  BMI 39.27 kg/m2  SpO2 98%  LMP 02/06/2014 (Exact Date) Wt Readings from Last 3 Encounters:  03/08/14 236 lb (107.049 kg)  02/08/14 232 lb (105.235 kg)  12/09/13 229 lb (103.874 kg)   Objective:   Physical Exam  Constitutional: She is oriented to person, place, and time. She appears well-developed and well-nourished. She has a sickly appearance. No distress.  Looked sickly but better compared to last visit.  HENT:  Head: Normocephalic.  Right Ear: External  ear and ear canal normal. Tympanic membrane is bulging.  Left Ear: External ear and ear canal normal. Tympanic membrane is bulging.  Nose: Rhinorrhea present. No mucosal edema. Right sinus exhibits no maxillary sinus tenderness and no frontal sinus tenderness. Left sinus exhibits no maxillary sinus tenderness and no frontal sinus tenderness.  Mouth/Throat: Uvula is midline and mucous membranes are normal. Mucous membranes are not pale and not dry. No trismus in the jaw. No uvula swelling. Posterior oropharyngeal erythema present. No oropharyngeal exudate,  posterior oropharyngeal edema or tonsillar abscesses.  TMs bulging bilaterally with clear fluid behind TMs.  TMs non-erythematous and non-edematous bilaterally. Turbinates erythematous bilaterally.  Eyes: Conjunctivae and lids are normal. Pupils are equal, round, and reactive to light. Right eye exhibits no discharge. Left eye exhibits no discharge. No scleral icterus.  Neck: Trachea normal, normal range of motion and phonation normal. Neck supple. No tracheal deviation present.  Cardiovascular: Normal rate, regular rhythm, S1 normal, S2 normal, normal heart sounds and normal pulses.  Exam reveals no gallop, no distant heart sounds and no friction rub.   No murmur heard. Pulmonary/Chest: Effort normal and breath sounds normal. No stridor. No respiratory distress. She has no decreased breath sounds. She has no wheezes. She has no rhonchi. She has no rales. She exhibits no tenderness.  Abdominal: Soft. Bowel sounds are normal.  Lymphadenopathy:  No tenderness or LAD.  Neurological: She is alert and oriented to person, place, and time. Gait normal.  Skin: Skin is warm, dry and intact. No rash noted. She is not diaphoretic.  Psychiatric: She has a normal mood and affect. Her speech is normal and behavior is normal. Judgment and thought content normal. Cognition and memory are normal.  Vitals reviewed.     Assessment & Plan:  1. Acute maxillary sinusitis, recurrence not specified- Viral or allergies? -Take Mucinex or Robitussin OTC for cough (Choose One). -Continue to take Zyrtec daily.  Then stop taking after not sick anymore. -Try Normal Saline Nasal Spray.  Do Not take Flonase.  2. Pregnant Please follow up with OB/GYN Stop taking Xanax, Nexium and promethazine-DM due to pregnancy.  Discussed medication effects and SE's.  Pt agreed to treatment plan. If you are not feeling better in 10-14 days, then please call the office.  Arlena Marsan, Stephani Police, PA-C 2:46 PM [facility]

## 2014-04-19 ENCOUNTER — Ambulatory Visit: Payer: Self-pay | Admitting: Physician Assistant

## 2014-12-15 ENCOUNTER — Encounter: Payer: Self-pay | Admitting: Emergency Medicine

## 2015-01-22 ENCOUNTER — Other Ambulatory Visit: Payer: Self-pay | Admitting: Physician Assistant

## 2015-01-31 ENCOUNTER — Ambulatory Visit (INDEPENDENT_AMBULATORY_CARE_PROVIDER_SITE_OTHER): Payer: BLUE CROSS/BLUE SHIELD | Admitting: Internal Medicine

## 2015-01-31 ENCOUNTER — Encounter: Payer: Self-pay | Admitting: Internal Medicine

## 2015-01-31 VITALS — BP 116/68 | HR 72 | Temp 98.0°F | Resp 16 | Ht 65.0 in | Wt 216.0 lb

## 2015-01-31 DIAGNOSIS — Z79899 Other long term (current) drug therapy: Secondary | ICD-10-CM

## 2015-01-31 DIAGNOSIS — K219 Gastro-esophageal reflux disease without esophagitis: Secondary | ICD-10-CM

## 2015-01-31 MED ORDER — RANITIDINE HCL 300 MG PO TABS: 300.0000 mg | ORAL_TABLET | Freq: Every day | ORAL | Status: DC

## 2015-01-31 MED ORDER — ESOMEPRAZOLE MAGNESIUM 40 MG PO CPDR: 40.0000 mg | DELAYED_RELEASE_CAPSULE | Freq: Every day | ORAL | Status: DC

## 2015-01-31 NOTE — Patient Instructions (Signed)
GETTING OFF OF PPI's    Nexium/protonix/prilosec/Omeprazole/Dexilant/Aciphex are called PPI's, they are great at healing your stomach but should only be taken for a short period of time.     Recent studies have shown that taken for a long time they  can increase the risk of osteoporosis (weakening of your bones), pneumonia, low magnesium, restless legs, Cdiff (infection that causes diarrhea), DEMENTIA and most recently kidney damage / disease / insufficiency.     Due to this information we want to try to stop the PPI but if you try to stop it abruptly this can cause rebound acid and worsening symptoms.   So this is how we want you to get off the PPI:  - Start taking the nexium/protonix/prilosec/PPI  every other day with  zantac (ranitidine) 2 x a day for 2-4 weeks  - then decrease the PPI to every 3 days while taking the zantac (ranitidine) twice a day the other  days for 2-4  Weeks  - then you can try the zantac (ranitidine) once at night or up to 2 x day as needed.  - you can continue on this once at night or stop all together  - Avoid alcohol, spicy foods, NSAIDS (aleve, ibuprofen) at this time. See foods below.   +++++++++++++++++++++++++++++++++++++++++++  Food Choices for Gastroesophageal Reflux Disease  When you have gastroesophageal reflux disease (GERD), the foods you eat and your eating habits are very important. Choosing the right foods can help ease the discomfort of GERD. WHAT GENERAL GUIDELINES DO I NEED TO FOLLOW?  Choose fruits, vegetables, whole grains, low-fat dairy products, and low-fat meat, fish, and poultry.  Limit fats such as oils, salad dressings, butter, nuts, and avocado.  Keep a food diary to identify foods that cause symptoms.  Avoid foods that cause reflux. These may be different for different people.  Eat frequent small meals instead of three large meals each day.  Eat your meals slowly, in a relaxed setting.  Limit fried foods.  Cook foods  using methods other than frying.  Avoid drinking alcohol.  Avoid drinking large amounts of liquids with your meals.  Avoid bending over or lying down until 2-3 hours after eating.   WHAT FOODS ARE NOT RECOMMENDED? The following are some foods and drinks that may worsen your symptoms:  Vegetables Tomatoes. Tomato juice. Tomato and spaghetti sauce. Chili peppers. Onion and garlic. Horseradish. Fruits Oranges, grapefruit, and lemon (fruit and juice). Meats High-fat meats, fish, and poultry. This includes hot dogs, ribs, ham, sausage, salami, and bacon. Dairy Whole milk and chocolate milk. Sour cream. Cream. Butter. Ice cream. Cream cheese.  Beverages Coffee and tea, with or without caffeine. Carbonated beverages or energy drinks. Condiments Hot sauce. Barbecue sauce.  Sweets/Desserts Chocolate and cocoa. Donuts. Peppermint and spearmint. Fats and Oils High-fat foods, including French fries and potato chips. Other Vinegar. Strong spices, such as black pepper, white pepper, red pepper, cayenne, curry powder, cloves, ginger, and chili powder. Nexium/protonix/prilosec are called PPI's, they are great at healing your stomach but should only be taken for a short period of time.    

## 2015-01-31 NOTE — Progress Notes (Signed)
Patient ID: Angela Dixon, female   DOB: [birth year], 29 y.o.   MRN: [id number]  Assessment and Plan:  Cholesterol: -Continue diet and exercise.    Pre-diabetes: -Continue diet and exercise.    Vitamin D Def: -cont supplement -continue medications.   GERD -recommend tapering off nexium  Continue diet and meds as discussed. Further disposition pending results of labs.  HPI 29 y.o. female  presents for 3 month follow up with hypertension, hyperlipidemia, prediabetes and vitamin D.  She is returning to [place] after the birth of her second son.  She reports that she is doing well.     Her blood pressure has been controlled at home, today their BP is BP: 116/68 mmHg.   She does not workout. She denies chest pain, shortness of breath, dizziness.   She is not on cholesterol medication and denies myalgias. Her cholesterol is not at goal. The cholesterol last visit was:   Lab Results  Component Value Date   CHOL 218* 12/09/2013   HDL 69 12/09/2013   LDLCALC 116* 12/09/2013   TRIG 166* 12/09/2013   CHOLHDL 3.2 12/09/2013     She has been working on diet and exercise for prediabetes, and denies foot ulcerations, hyperglycemia, hypoglycemia , increased appetite, nausea, paresthesia of the feet, polydipsia, polyuria, visual disturbances, vomiting and weight loss. Last A1C in the office was:  Lab Results  Component Value Date   HGBA1C 5.4 12/09/2013    Patient is on Vitamin D supplement.  Lab Results  Component Value Date   VD25OH 43 12/09/2013      Current Medications:  Current Outpatient Prescriptions on File Prior to Visit  Medication Sig Dispense Refill  . cetirizine (ZYRTEC) 10 MG tablet Take 10 mg by mouth daily.    . Cholecalciferol 4000 UNITS CAPS Take 1 capsule by mouth daily.    Marland Kitchen esomeprazole (NEXIUM) 40 MG capsule Take 1 capsule (40 mg total) by mouth daily. 90 capsule 3  . Flaxseed, Linseed, (FLAX SEED OIL) 1000 MG CAPS Take by mouth daily.    . Magnesium 500 MG CAPS Take  by mouth daily.    . Multiple Vitamin (MULTIVITAMIN WITH MINERALS) TABS tablet Take 1 tablet by mouth daily.    . Probiotic Product (PROBIOTIC DAILY PO) Take by mouth.     No current facility-administered medications on file prior to visit.    Medical History:  Past Medical History  Diagnosis Date  . Abnormal Pap smear   . Hypercholesterolemia   . Fracture   . Acid reflux   . Chlamydia 2006  . Vitamin D deficiency   . Anemia     Allergies:  Allergies  Allergen Reactions  . Neomycin      Review of Systems:  Review of Systems  Constitutional: Negative for fever, chills and malaise/fatigue.  HENT: Negative for congestion, ear pain and sore throat.   Respiratory: Negative for cough, shortness of breath and wheezing.   Cardiovascular: Negative for chest pain, palpitations and leg swelling.  Gastrointestinal: Negative for heartburn, diarrhea, constipation, blood in stool and melena.  Genitourinary: Negative.   Neurological: Negative for dizziness, sensory change, loss of consciousness and headaches.  Psychiatric/Behavioral: Negative for depression. The patient is not nervous/anxious and does not have insomnia.     Family history- Review and unchanged  Social history- Review and unchanged  Physical Exam: BP 116/68 mmHg  Pulse 72  Temp(Src) 98 F (36.7 C) (Temporal)  Resp 16  Ht 5\' 5"  (1.651 m)  Wt 216 lb (  97.977 kg)  BMI 35.94 kg/m2  LMP 02/06/2014 (Exact Date)  Breastfeeding? Unknown Wt Readings from Last 3 Encounters:  01/31/15 216 lb (97.977 kg)  03/08/14 236 lb (107.049 kg)  02/08/14 232 lb (105.235 kg)    General Appearance: Well nourished well developed, in no apparent distress. Eyes: PERRLA, EOMs, conjunctiva no swelling or erythema ENT/Mouth: Ear canals normal without obstruction, swelling, erythma, discharge.  TMs normal bilaterally.  Oropharynx moist, clear, without exudate, or postoropharyngeal swelling. Neck: Supple, thyroid normal,no cervical  adenopathy  Respiratory: Respiratory effort normal, Breath sounds clear A&P without rhonchi, wheeze, or rale.  No retractions, no accessory usage. Cardio: RRR with no MRGs. Brisk peripheral pulses without edema.  Abdomen: Soft, + BS,  Non tender, no guarding, rebound, hernias, masses. Musculoskeletal: Full ROM, 5/5 strength, Normal gait Skin: Warm, dry without rashes, lesions, ecchymosis.  Neuro: Awake and oriented X 3, Cranial nerves intact. Normal muscle tone, no cerebellar symptoms. Psych: Normal affect, Insight and Judgment appropriate.    Starlyn Skeans, PA-C 10:09 AM [facility]

## 2015-04-06 ENCOUNTER — Other Ambulatory Visit: Payer: Self-pay | Admitting: Internal Medicine

## 2015-04-06 ENCOUNTER — Encounter: Payer: Self-pay | Admitting: Internal Medicine

## 2015-04-06 MED ORDER — RANITIDINE HCL 300 MG PO TABS: 300.0000 mg | ORAL_TABLET | Freq: Two times a day (BID) | ORAL | Status: DC

## 2015-08-02 ENCOUNTER — Encounter: Payer: Self-pay | Admitting: Internal Medicine

## 2015-08-14 ENCOUNTER — Encounter: Payer: Self-pay | Admitting: Internal Medicine

## 2015-08-14 ENCOUNTER — Ambulatory Visit (INDEPENDENT_AMBULATORY_CARE_PROVIDER_SITE_OTHER): Payer: BLUE CROSS/BLUE SHIELD | Admitting: Internal Medicine

## 2015-08-14 VITALS — BP 112/64 | HR 74 | Temp 98.2°F | Resp 18 | Ht 64.5 in | Wt 222.0 lb

## 2015-08-14 DIAGNOSIS — Z79899 Other long term (current) drug therapy: Secondary | ICD-10-CM

## 2015-08-14 DIAGNOSIS — Z0000 Encounter for general adult medical examination without abnormal findings: Secondary | ICD-10-CM | POA: Diagnosis not present

## 2015-08-14 DIAGNOSIS — Z136 Encounter for screening for cardiovascular disorders: Secondary | ICD-10-CM

## 2015-08-14 DIAGNOSIS — E559 Vitamin D deficiency, unspecified: Secondary | ICD-10-CM

## 2015-08-14 DIAGNOSIS — Z131 Encounter for screening for diabetes mellitus: Secondary | ICD-10-CM

## 2015-08-14 DIAGNOSIS — Z13220 Encounter for screening for lipoid disorders: Secondary | ICD-10-CM

## 2015-08-14 DIAGNOSIS — I10 Essential (primary) hypertension: Secondary | ICD-10-CM

## 2015-08-14 DIAGNOSIS — Z1329 Encounter for screening for other suspected endocrine disorder: Secondary | ICD-10-CM

## 2015-08-14 DIAGNOSIS — Z130 Encounter for screening for diseases of the blood and blood-forming organs and certain disorders involving the immune mechanism: Secondary | ICD-10-CM

## 2015-08-14 DIAGNOSIS — Z1389 Encounter for screening for other disorder: Secondary | ICD-10-CM

## 2015-08-14 LAB — LIPID PANEL
CHOL/HDL RATIO: 3.7 ratio (ref <=5.0)
Cholesterol: 191 mg/dL (ref 125–200)
HDL: 52 mg/dL (ref >=46)
LDL Cholesterol: 116 mg/dL (ref <130)
Triglycerides: 113 mg/dL (ref <150)
VLDL: 23 mg/dL (ref <30)

## 2015-08-14 LAB — HEPATIC FUNCTION PANEL
ALT: 11 U/L (ref 6–29)
AST: 15 U/L (ref 10–30)
Albumin: 4.5 g/dL (ref 3.6–5.1)
Alkaline Phosphatase: 63 U/L (ref 33–115)
Bilirubin, Direct: 0.1 mg/dL (ref <=0.2)
Indirect Bilirubin: 0.3 mg/dL (ref 0.2–1.2)
TOTAL PROTEIN: 7 g/dL (ref 6.1–8.1)
Total Bilirubin: 0.4 mg/dL (ref 0.2–1.2)

## 2015-08-14 LAB — URINALYSIS, ROUTINE W REFLEX MICROSCOPIC
Bilirubin Urine: NEGATIVE
Glucose, UA: NEGATIVE
Hgb urine dipstick: NEGATIVE
Ketones, ur: NEGATIVE
Leukocytes, UA: NEGATIVE
NITRITE: NEGATIVE
PH: 6 (ref 5.0–8.0)
Protein, ur: NEGATIVE
SPECIFIC GRAVITY, URINE: 1.012 (ref 1.001–1.035)

## 2015-08-14 LAB — HEMOGLOBIN A1C
HEMOGLOBIN A1C: 5.2 % (ref <5.7)
MEAN PLASMA GLUCOSE: 103 mg/dL

## 2015-08-14 LAB — BASIC METABOLIC PANEL WITH GFR
BUN: 9 mg/dL (ref 7–25)
CALCIUM: 9.3 mg/dL (ref 8.6–10.2)
CO2: 21 mmol/L (ref 20–31)
CREATININE: 0.56 mg/dL (ref 0.50–1.10)
Chloride: 105 mmol/L (ref 98–110)
GFR, Est Non African American: >89 mL/min (ref >=60)
GLUCOSE: 81 mg/dL (ref 65–99)
Potassium: 4.3 mmol/L (ref 3.5–5.3)
SODIUM: 138 mmol/L (ref 135–146)

## 2015-08-14 LAB — CBC WITH DIFFERENTIAL/PLATELET
BASOS ABS: 0 {cells}/uL (ref 0–200)
Basophils Relative: 0 %
EOS PCT: 3 %
Eosinophils Absolute: 411 cells/uL (ref 15–500)
HEMATOCRIT: 42.7 % (ref 35.0–45.0)
HEMOGLOBIN: 14.3 g/dL (ref 11.7–15.5)
LYMPHS ABS: 2055 {cells}/uL (ref 850–3900)
LYMPHS PCT: 15 %
MCH: 28.9 pg (ref 27.0–33.0)
MCHC: 33.5 g/dL (ref 32.0–36.0)
MCV: 86.4 fL (ref 80.0–100.0)
MPV: 10.1 fL (ref 7.5–12.5)
Monocytes Absolute: 959 cells/uL — ABNORMAL HIGH (ref 200–950)
Monocytes Relative: 7 %
NEUTROS PCT: 75 %
Neutro Abs: 10275 cells/uL — ABNORMAL HIGH (ref 1500–7800)
Platelets: 259 10*3/uL (ref 140–400)
RBC: 4.94 MIL/uL (ref 3.80–5.10)
RDW: 13.8 % (ref 11.0–15.0)
WBC: 13.7 10*3/uL — AB (ref 3.8–10.8)

## 2015-08-14 LAB — IRON AND TIBC
%SAT: 7 % — ABNORMAL LOW (ref 11–50)
Iron: 28 ug/dL — ABNORMAL LOW (ref 40–190)
TIBC: 377 ug/dL (ref 250–450)
UIBC: 349 ug/dL (ref 125–400)

## 2015-08-14 LAB — MICROALBUMIN / CREATININE URINE RATIO: CREATININE, URINE: 99 mg/dL (ref 20–320)

## 2015-08-14 LAB — MAGNESIUM: MAGNESIUM: 2 mg/dL (ref 1.5–2.5)

## 2015-08-14 LAB — VITAMIN B12: Vitamin B-12: 556 pg/mL (ref 200–1100)

## 2015-08-14 LAB — TSH: TSH: 1.6 mIU/L

## 2015-08-14 MED ORDER — AZELASTINE HCL 0.1 % NA SOLN: 2.0000 | Freq: Two times a day (BID) | NASAL | Status: DC

## 2015-08-14 MED ORDER — PREDNISONE 20 MG PO TABS: ORAL_TABLET | ORAL | Status: DC

## 2015-08-14 MED ORDER — PSEUDOEPH-BROMPHEN-DM 30-2-10 MG/5ML PO SYRP: 5.0000 mL | ORAL_SOLUTION | Freq: Four times a day (QID) | ORAL | Status: DC | PRN

## 2015-08-14 NOTE — Progress Notes (Signed)
Annual Screening Comprehensive Examination   This very nice 30 y.o.female presents for complete physical.  Patient has no major health issues.  Patient reports no complaints at this time.   Patient reports that she has been doing well, but sometimes she feels like she gets really hungry.  She reports that she eats 3 meals per day.  She does not snack.  She does eat a breakfast sandwich, she will do a salad or a sandwich at lunch and then she will do a meat and a veggie.  She reports that she is not snacking due to being busy at lunch.  She reports that she has been walking more, but she is slowing down on that due to time constraints.    Finally, patient has history of Vitamin D Deficiency and last vitamin D was  Lab Results  Component Value Date   VD25OH 43 12/09/2013  .  Currently on supplementation  She reports that her allergies have been under control.  She does feel like she has a cold right now.  She reports that she has a dry cough.  She does have some mild yellow to clear rhinorrhea.  She reports that it is currently moving well.  She reports that she is using decongestant and flonase.    She does worry about a mole under her left breast.  She reports that she also has some skin tags that she would like to remove.    She does still see Obgyn.  She is due for an appointment at this time.     Current Outpatient Prescriptions on File Prior to Visit  Medication Sig Dispense Refill  . cetirizine (ZYRTEC) 10 MG tablet Take 10 mg by mouth daily.    . Cholecalciferol 4000 UNITS CAPS Take 1 capsule by mouth daily.    Marland Kitchen esomeprazole (NEXIUM) 40 MG capsule Take 1 capsule (40 mg total) by mouth daily. 90 capsule 3  . Flaxseed, Linseed, (FLAX SEED OIL) 1000 MG CAPS Take by mouth daily.    . Magnesium 500 MG CAPS Take by mouth daily.    . Multiple Vitamin (MULTIVITAMIN WITH MINERALS) TABS tablet Take 1 tablet by mouth daily.    . Probiotic Product (PROBIOTIC DAILY PO) Take by mouth.    .  ranitidine (ZANTAC) 300 MG tablet Take 1 tablet (300 mg total) by mouth 2 (two) times daily. 180 tablet 1   No current facility-administered medications on file prior to visit.    Allergies  Allergen Reactions  . Neomycin     Past Medical History  Diagnosis Date  . Abnormal Pap smear   . Hypercholesterolemia   . Fracture   . Acid reflux   . Chlamydia 2006  . Vitamin D deficiency   . Anemia     Immunization History  Administered Date(s) Administered  . PPD Test 12/09/2013  . Tdap 11/19/2010    Past Surgical History  Procedure Laterality Date  . Wisdom tooth extraction    . Colposcopy  2006    Family History  Problem Relation Age of Onset  . Hyperlipidemia Mother   . Asthma Sister   . Hypertension Maternal Grandmother   . Diabetes Maternal Grandmother   . Heart disease Maternal Grandfather   . Cancer Paternal Grandmother     throat  . Cancer Paternal Grandfather     lung    Social History   Social History  . Marital Status: Married    Spouse Name: N/A  . Number of Children:  N/A  . Years of Education: N/A   Occupational History  . Not on file.   Social History Main Topics  . Smoking status: Former Smoker -- 1.00 packs/day    Types: Cigarettes    Quit date: 07/09/2006  . Smokeless tobacco: Not on file  . Alcohol Use: No  . Drug Use: No  . Sexual Activity: Not on file   Other Topics Concern  . Not on file   Social History Narrative   Review of Systems  Constitutional: Negative for fever, chills and malaise/fatigue.  HENT: Negative for congestion, ear pain and sore throat.   Eyes: Negative.   Respiratory: Negative for cough, shortness of breath and wheezing.   Cardiovascular: Negative for chest pain, palpitations and leg swelling.  Gastrointestinal: Positive for diarrhea. Negative for heartburn, abdominal pain, constipation, blood in stool and melena.  Genitourinary: Positive for urgency. Negative for dysuria, frequency, hematuria and flank  pain.  Musculoskeletal: Negative.   Skin: Negative.   Neurological: Negative for dizziness, sensory change, loss of consciousness and headaches.  Psychiatric/Behavioral: Negative for depression. The patient is not nervous/anxious and does not have insomnia.       Physical Exam  BP 112/64 mmHg  Pulse 74  Temp(Src) 98.2 F (36.8 C) (Temporal)  Resp 18  Ht 5' 4.5" (1.638 m)  Wt 222 lb (100.699 kg)  BMI 37.53 kg/m2  General Appearance: Well nourished and in no apparent distress. Eyes: PERRLA, EOMs, conjunctiva no swelling or erythema, normal fundi and vessels. Sinuses: No frontal/maxillary tenderness ENT/Mouth: EACs patent / TMs  nl. Nares clear without erythema, swelling, mucoid exudates. Oral hygiene is good. No erythema, swelling, or exudate. Tongue normal, non-obstructing. Tonsils not swollen or erythematous. Hearing normal.  Neck: Supple, thyroid normal. No bruits, nodes or JVD. Respiratory: Respiratory effort normal.  BS equal and clear bilateral without rales, rhonci, wheezing or stridor. Cardio: Heart sounds are normal with regular rate and rhythm and no murmurs, rubs or gallops. Peripheral pulses are normal and equal bilaterally without edema. No aortic or femoral bruits. Chest: symmetric with normal excursions and percussion. Breasts: Symmetric, without lumps, nipple discharge, retractions, or fibrocystic changes. Mole beneath the left breast which is raised 0.5 mm and irregular in color and shape.  Abdomen: Flat, soft, with bowl sounds. Nontender, no guarding, rebound, hernias, masses, or organomegaly.  Lymphatics: Non tender without lymphadenopathy.  Musculoskeletal: Full ROM all peripheral extremities, joint stability, 5/5 strength, and normal gait. Skin: Warm and dry without rashes, lesions, cyanosis, clubbing or  ecchymosis.  Neuro: Cranial nerves intact, reflexes equal bilaterally. Normal muscle tone, no cerebellar symptoms. Sensation intact.  Pysch: Awake and oriented X  3, normal affect, Insight and Judgment appropriate.   Assessment and Plan   1. Routine general medical examination at a health care facility  - CBC with Differential/Platelet - BASIC METABOLIC PANEL WITH GFR - Hepatic function panel - Magnesium  2. Screening for hyperlipidemia  - Lipid panel  3. Screening for diabetes mellitus  - Hemoglobin A1c - Insulin, random  4. Screening for deficiency anemia  - Iron and TIBC - Vitamin B12  5. Screening for hematuria or proteinuria  - Urinalysis, Routine w reflex microscopic (not at [facility]) - Microalbumin / creatinine urine ratio  6. Screening for cardiovascular condition  - EKG 12-Lead  7. Vitamin D deficiency  - VITAMIN D 25 Hydroxy (Vit-D Deficiency, Fractures)  8. Screening for thyroid disorder  - TSH  9.  GERD -acceptable to take omeprazole prn -small frequent meals -increase  snacking to decrease over eating at meal time.   Recommended that we schedule removal for a mole beneath the left breast and we can take off some skin tags as well at removal appointment.  All other screening is up to date.  She will schedule a pap smear with her Obgyn.  She was asked to have them send a note over.       Continue prudent diet as discussed, weight control, regular exercise, and medications. Routine screening labs and tests as requested with regular follow-up as recommended.  Over 40 minutes of exam, counseling, chart review and critical decision making was performed

## 2015-08-15 LAB — VITAMIN D 25 HYDROXY (VIT D DEFICIENCY, FRACTURES): VIT D 25 HYDROXY: 29 ng/mL — AB (ref 30–100)

## 2015-08-15 LAB — INSULIN, RANDOM: INSULIN: 4 u[IU]/mL (ref 2.0–19.6)

## 2015-09-01 ENCOUNTER — Encounter: Payer: Self-pay | Admitting: Internal Medicine

## 2015-10-06 ENCOUNTER — Ambulatory Visit (INDEPENDENT_AMBULATORY_CARE_PROVIDER_SITE_OTHER): Payer: BLUE CROSS/BLUE SHIELD | Admitting: Internal Medicine

## 2015-10-06 ENCOUNTER — Encounter: Payer: Self-pay | Admitting: Internal Medicine

## 2015-10-06 VITALS — BP 126/82 | HR 68 | Temp 98.2°F | Resp 18 | Ht 64.5 in

## 2015-10-06 DIAGNOSIS — D239 Other benign neoplasm of skin, unspecified: Secondary | ICD-10-CM | POA: Diagnosis not present

## 2015-10-06 DIAGNOSIS — L918 Other hypertrophic disorders of the skin: Secondary | ICD-10-CM | POA: Diagnosis not present

## 2015-10-06 DIAGNOSIS — D229 Melanocytic nevi, unspecified: Secondary | ICD-10-CM

## 2015-10-06 NOTE — Progress Notes (Signed)
Patient presents to the office for evaluation of abnormal mole to the left breast.  She also has several skin tags that she would like to have removed.  Her mole has changed in color.  She does consent to a shave excision of the mole to be sent to pathology.  She is aware of risks for infection, bleeding, or scarring.    Patient was prepped and semisterilely draped.  She was then numbed with 1 cc marcaine plain with epinephrine.  An 11 blade scalpal was used to horizontally shave the 0.5 cm irregularly shaped mole off her breast.  Skin biopsy was placed and formolin to be sent to pathology.  Bleeding was well controlled with cautery.  Triple antiobiotic ointment was placed and bandaid was placed.  Skin tags were also numbed with 0.1 cc of marcaine and we cut and cauterized without bleeding.  Procedure was tolerated well.

## 2015-10-09 DIAGNOSIS — D225 Melanocytic nevi of trunk: Secondary | ICD-10-CM | POA: Diagnosis not present

## 2015-10-11 ENCOUNTER — Other Ambulatory Visit: Payer: Self-pay | Admitting: *Deleted

## 2015-10-11 MED ORDER — RANITIDINE HCL 300 MG PO TABS: 300.0000 mg | ORAL_TABLET | Freq: Two times a day (BID) | ORAL | Status: DC

## 2016-03-04 ENCOUNTER — Other Ambulatory Visit: Payer: Self-pay | Admitting: Internal Medicine

## 2016-03-09 ENCOUNTER — Encounter: Payer: Self-pay | Admitting: *Deleted

## 2016-04-28 ENCOUNTER — Other Ambulatory Visit: Payer: Self-pay | Admitting: Internal Medicine

## 2016-08-15 ENCOUNTER — Encounter: Payer: Self-pay | Admitting: Internal Medicine

## 2016-10-28 NOTE — Progress Notes (Deleted)
Complete Physical  Assessment and Plan: Health Maintenance- Discussed STD testing, safe sex, alcohol and drug awareness, drinking and driving dangers, wearing a seat belt and general safety measures for young adult. . Discussed med's effects and SE's. Screening labs and tests as requested with regular follow-up as recommended. Over 40 minutes of exam, counseling, chart review and critical decision making was performed  HPI  This very nice 31 y.o.female presents for complete physical.  Patient has no major health issues.  Patient reports no complaints at this time.  Her blood pressure {HAS HAS NOT:18834} been controlled at home, today their BP is    She {DOES_DOES VEL:38101} workout. She denies chest pain, shortness of breath, dizziness.  She {ACTION; IS/IS BPZ:02585277} on cholesterol medication and denies myalgias. Her cholesterol {ACTION; IS/IS NOT:21021397} at goal. The cholesterol last visit was:   Lab Results  Component Value Date   CHOL 191 08/14/2015   HDL 52 08/14/2015   LDLCALC 116 08/14/2015   TRIG 113 08/14/2015   CHOLHDL 3.7 08/14/2015    She {Has/has not:18111} been working on diet and exercise for prediabetes, and denies {Symptoms; diabetes w/o none:19199}. Last A1C in the office was:  Lab Results  Component Value Date   HGBA1C 5.2 08/14/2015   Patient is on Vitamin D supplement.   Lab Results  Component Value Date   VD25OH 29 (L) 08/14/2015     BMI is There is no height or weight on file to calculate BMI., she is working on diet and exercise. Wt Readings from Last 3 Encounters:  08/14/15 222 lb (100.7 kg)  01/31/15 216 lb (98 kg)  03/08/14 236 lb (107 kg)     Current Medications:  Current Outpatient Prescriptions on File Prior to Visit  Medication Sig Dispense Refill  . cetirizine (ZYRTEC) 10 MG tablet Take 10 mg by mouth daily.    . Cholecalciferol 4000 UNITS CAPS Take 1 capsule by mouth daily.    Marland Kitchen esomeprazole (NEXIUM) 40 MG capsule TAKE ONE CAPSULE BY  MOUTH EVERY DAY 90 capsule 2  . Flaxseed, Linseed, (FLAX SEED OIL) 1000 MG CAPS Take by mouth daily.    . Magnesium 500 MG CAPS Take by mouth daily.    . Multiple Vitamin (MULTIVITAMIN WITH MINERALS) TABS tablet Take 1 tablet by mouth daily.    . Probiotic Product (PROBIOTIC DAILY PO) Take by mouth.    . ranitidine (ZANTAC) 300 MG tablet TAKE 1 TABLET (300 MG TOTAL) BY MOUTH 2 (TWO) TIMES DAILY. 180 tablet 1   No current facility-administered medications on file prior to visit.    Health Maintenance:   Immunization History  Administered Date(s) Administered  . PPD Test 12/09/2013  . Tdap 11/19/2010    TD/TDAP: Influenza: Pneumovax:  Prevnar 13:  HPV vaccines:   LMP: No LMP recorded. Patient is not currently having periods (Reason: IUD). Sexually Active: {YES NO:22349} STD testing offered Pap: MGM: :  Allergies:  Allergies  Allergen Reactions  . Neomycin    Medical History:  has Vaginal delivery; Second-degree perineal laceration, with delivery; Vitamin D deficiency; Depression; Generalized anxiety disorder; Pregnant; and Medication management on her problem list. Surgical History:  She  has a past surgical history that includes Wisdom tooth extraction and Colposcopy (2006). Family History:  Her family history includes Asthma in her sister; Cancer in her paternal grandfather and paternal grandmother; Diabetes in her maternal grandmother; Heart disease in her maternal grandfather; Hyperlipidemia in her mother; Hypertension in her maternal grandmother. Social History:   reports  that she quit smoking about 10 years ago. Her smoking use included Cigarettes. She smoked 1.00 pack per day. She does not have any smokeless tobacco history on file. She reports that she does not drink alcohol or use drugs.  Review of Systems: ROS  Physical Exam: Estimated body mass index is 37.52 kg/m as calculated from the following:   Height as of 08/14/15: 5' 4.5" (1.638 m).   Weight as of  08/14/15: 222 lb (100.7 kg). There were no vitals taken for this visit. General Appearance: Well nourished, in no apparent distress.  Eyes: PERRLA, EOMs, conjunctiva no swelling or erythema, normal fundi and vessels.  Sinuses: No Frontal/maxillary tenderness  ENT/Mouth: Ext aud canals clear, normal light reflex with TMs without erythema, bulging. Good dentition. No erythema, swelling, or exudate on post pharynx. Tonsils not swollen or erythematous. Hearing normal.  Neck: Supple, thyroid normal. No bruits  Respiratory: Respiratory effort normal, BS equal bilaterally without rales, rhonchi, wheezing or stridor.  Cardio: RRR without murmurs, rubs or gallops. Brisk peripheral pulses without edema.  Chest: symmetric, with normal excursions and percussion.  Breasts: Symmetric, without lumps, nipple discharge, retractions.  Abdomen: Soft, nontender, no guarding, rebound, hernias, masses, or organomegaly.  Lymphatics: Non tender without lymphadenopathy.  Genitourinary:  Musculoskeletal: Full ROM all peripheral extremities,5/5 strength, and normal gait.  Skin: Warm, dry without rashes, lesions, ecchymosis. Neuro: Cranial nerves intact, reflexes equal bilaterally. Normal muscle tone, no cerebellar symptoms. Sensation intact.  Psych: Awake and oriented X 3, normal affect, Insight and Judgment appropriate.   EKG: defer  [name] 12:09 PM [facility]

## 2016-10-29 ENCOUNTER — Encounter: Payer: Self-pay | Admitting: Physician Assistant

## 2016-12-25 ENCOUNTER — Ambulatory Visit (INDEPENDENT_AMBULATORY_CARE_PROVIDER_SITE_OTHER): Payer: 59 | Admitting: Adult Health

## 2016-12-25 ENCOUNTER — Encounter: Payer: Self-pay | Admitting: Adult Health

## 2016-12-25 VITALS — BP 116/80 | HR 72 | Temp 97.2°F | Resp 16 | Ht 64.5 in | Wt 214.8 lb

## 2016-12-25 DIAGNOSIS — J069 Acute upper respiratory infection, unspecified: Secondary | ICD-10-CM

## 2016-12-25 DIAGNOSIS — J0100 Acute maxillary sinusitis, unspecified: Secondary | ICD-10-CM

## 2016-12-25 MED ORDER — PREDNISONE 20 MG PO TABS: ORAL_TABLET | ORAL | 0 refills | Status: DC

## 2016-12-25 MED ORDER — AZITHROMYCIN 250 MG PO TABS: ORAL_TABLET | ORAL | 1 refills | Status: AC

## 2016-12-25 MED ORDER — ALBUTEROL SULFATE HFA 108 (90 BASE) MCG/ACT IN AERS: 2.0000 | INHALATION_SPRAY | RESPIRATORY_TRACT | 0 refills | Status: DC | PRN

## 2016-12-25 NOTE — Patient Instructions (Signed)

## 2016-12-25 NOTE — Progress Notes (Signed)
Assessment and Plan:  Angela Dixon was seen today for acute visit and cough.  Diagnoses and all orders for this visit:  Upper respiratory tract infection, unspecified type -     azithromycin (ZITHROMAX) 250 MG tablet; Take 2 tablets (500 mg) on  Day 1,  followed by 1 tablet (250 mg) once daily on Days 2 through 5. -     predniSONE (DELTASONE) 20 MG tablet; 2 tablets daily for 3 days, 1 tablet daily for 4 days. -     albuterol (VENTOLIN HFA) 108 (90 Base) MCG/ACT inhaler; Inhale 2 puffs into the lungs every 4 (four) hours as needed for wheezing or shortness of breath.  Acute maxillary sinusitis, recurrence not specified -     azithromycin (ZITHROMAX) 250 MG tablet; Take 2 tablets (500 mg) on  Day 1,  followed by 1 tablet (250 mg) once daily on Days 2 through 5. -     predniSONE (DELTASONE) 20 MG tablet; 2 tablets daily for 3 days, 1 tablet daily for 4 days.  Given printed prescriptions of zpack and prednisone should her symptoms not begin to improve in the next 3 days. Albuterol provided for intermittent wheezing at night. Encouraged to continue with dayquil/nyquil and to monitor fever, let us know if her fever continues to increase despite use of tylenol or NSAIDS. May continue astalin or try flonase instead for a few days with saline nasal irrigation.   She denies wanting a prescription for cough today.   Encouraged to push fluids, general flu care information provided, encouraged to follow up as scheduled, or earlier as needed.   Discussed med's effects and SE's.   Over 30 minutes of exam, counseling, chart review, and critical decision making was performed.   Future Appointments Date Time Provider [place]  01/09/2017 10:00 AM Angela Mutters, PA-C [id number] None    ------------------------------------------------------------------------------------------------------------------   HPI 31 y.o.female presents for c/o low grade fever x 1 week, congestion, sinus pressure.   Was  experiencing "bad allergies" and congestion x4 weeks, take zyrtec daily, azelastine nasal spray daily. Symptoms became worse last Thursday, increased congestion, sinus pressure, "cloudy head," noted fever 100-100.79F, took dayquil/nyquil for the first few days, which improved nasal congestion but fever has continued to return. Intermittent dry cough this past week with some increased nasal drainage and mild wheezing at night, mild pharyngitis.   Husband at home with similar symptoms, daughter recently was positive for strep throat.   Past Medical History:  Diagnosis Date  . Abnormal Pap smear   . Acid reflux   . Anemia   . Chlamydia 2006  . Fracture   . Hypercholesterolemia   . Vitamin D deficiency      Allergies  Allergen Reactions  . Neomycin     Current Outpatient Prescriptions on File Prior to Visit  Medication Sig  . cetirizine (ZYRTEC) 10 MG tablet Take 10 mg by mouth daily.  . Cholecalciferol 4000 UNITS CAPS Take 1 capsule by mouth daily.  Marland Kitchen esomeprazole (NEXIUM) 40 MG capsule TAKE ONE CAPSULE BY MOUTH EVERY DAY  . Flaxseed, Linseed, (FLAX SEED OIL) 1000 MG CAPS Take by mouth daily.  . Magnesium 500 MG CAPS Take by mouth daily.  . Multiple Vitamin (MULTIVITAMIN WITH MINERALS) TABS tablet Take 1 tablet by mouth daily.  . Probiotic Product (PROBIOTIC DAILY PO) Take by mouth.  . ranitidine (ZANTAC) 300 MG tablet TAKE 1 TABLET (300 MG TOTAL) BY MOUTH 2 (TWO) TIMES DAILY.   No current facility-administered medications on file prior  to visit.     ROS: Review of Systems  Constitutional: Positive for fever. Negative for chills, diaphoresis and malaise/fatigue.  HENT: Positive for congestion (Endorses pressure) and sore throat. Negative for ear discharge, ear pain, sinus pain and tinnitus.   Eyes: Negative for blurred vision, pain, discharge and redness.  Respiratory: Positive for cough, sputum production (small amount of yellow to brown, thick) and wheezing (Mild intermittent at  night. ). Negative for hemoptysis, shortness of breath and stridor.   Cardiovascular: Negative for chest pain and palpitations.  Gastrointestinal: Negative for abdominal pain, diarrhea, nausea and vomiting.  Genitourinary: Negative for dysuria, frequency and urgency.  Musculoskeletal: Negative for joint pain and myalgias.  Skin: Negative for rash.  Neurological: Negative for dizziness, sensory change and headaches.  Endo/Heme/Allergies: Positive for environmental allergies.  All other systems reviewed and are negative.    Physical Exam:  BP 116/80   Pulse 72   Temp (!) 97.2 F (36.2 C)   Resp 16   Ht 5' 4.5" (1.638 m)   Wt 214 lb 12.8 oz (97.4 kg)   SpO2 96%   BMI 36.30 kg/m   General Appearance: Well nourished, in no apparent distress. Eyes: PERRLA, EOMs, conjunctiva no swelling or erythema Sinuses: No Frontal tenderness, mild bilateral maxillary tenderness ENT/Mouth: Ext aud canals clear, TMs without erythema, bulging. Left ear with some effusion. No erythema, swelling, or exudate on post pharynx.  Tonsils not swollen or erythematous. Hearing normal.  Neck: Supple, thyroid normal.  Respiratory: Respiratory effort normal, BS equal bilaterally with some scattered rales that clear with cough, no rhonchi, wheezing or stridor.  Cardio: RRR with no MRGs. Brisk peripheral pulses without edema.  Abdomen: Soft, + BS.  Non tender, no guarding, rebound, hernias, masses. Lymphatics: Non tender without lymphadenopathy.  Musculoskeletal: Full ROM, 5/5 strength, normal gait.  Skin: Warm, dry without rashes, lesions, ecchymosis.  Neuro: Normal muscle tone, no cerebellar symptoms. Sensation intact.  Psych: Awake and oriented X 3, normal affect, Insight and Judgment appropriate.     Angela Ribas, NP 4:09 PM [facility]

## 2017-01-08 NOTE — Progress Notes (Signed)
Complete Physical  Assessment and Plan:  Routine general medical examination at a health care facility 1 year  Medication management -     CBC with Differential/Platelet -     BASIC METABOLIC PANEL WITH GFR -     Hepatic function panel -     Magnesium  Generalized anxiety disorder continue medications, stress management techniques discussed, increase water, good sleep hygiene discussed, increase exercise, and increase veggies.  -     TSH  Recurrent major depressive disorder, in partial remission ([place]) - continue medications, stress management techniques discussed, increase water, good sleep hygiene discussed, increase exercise, and increase veggies.   Vitamin D deficiency -     VITAMIN D 25 Hydroxy (Vit-D Deficiency, Fractures)  Screening for hyperlipidemia -     Lipid panel  Screening for deficiency anemia -     Iron,Total/Total Iron Binding Cap -     Vitamin B12  Needs flu shot -     FLU VACCINE MDCK QUAD W/Preservative  GERD -     omeprazole (PRILOSEC) 20 MG capsule; Take 1 capsule (20 mg total) by mouth daily. - emphasize diet, if not better? Need Hpylori testing, discussed with patient,will wait.   Discussed med's effects and SE's. Screening labs and tests as requested with regular follow-up as recommended. Over 40 minutes of exam, counseling, chart review, and complex, high level critical decision making was performed this visit.   HPI  31 y.o. female  presents for a complete physical and follow up for has Vitamin D deficiency; Depression; Generalized anxiety disorder; and Medication management on her problem list..  Her blood pressure has been controlled at home, today their BP is BP: 124/80 She does not workout. She denies chest pain, shortness of breath, dizziness.   She was laid off her job x 1 week, this has caused some extra stress, she has had trouble sleeping.  She is not on cholesterol medication and denies myalgias. Her cholesterol is not at goal. The  cholesterol last visit was:   Lab Results  Component Value Date   CHOL 191 08/14/2015   HDL 52 08/14/2015   LDLCALC 116 08/14/2015   TRIG 113 08/14/2015   CHOLHDL 3.7 08/14/2015   Last A1C in the office was:  Lab Results  Component Value Date   HGBA1C 5.2 08/14/2015   Patient is on Vitamin D supplement, 4000 a day.   Lab Results  Component Value Date   VD25OH 29 (L) 08/14/2015     BMI is Body mass index is 36.61 kg/m., she is working on diet and exercise. Wt Readings from Last 3 Encounters:  01/09/17 220 lb (99.8 kg)  12/25/16 214 lb 12.8 oz (97.4 kg)  08/14/15 222 lb (100.7 kg)    Current Medications:  Current Outpatient Prescriptions on File Prior to Visit  Medication Sig Dispense Refill  . albuterol (VENTOLIN HFA) 108 (90 Base) MCG/ACT inhaler Inhale 2 puffs into the lungs every 4 (four) hours as needed for wheezing or shortness of breath. 1 Inhaler 0  . cetirizine (ZYRTEC) 10 MG tablet Take 10 mg by mouth daily.    . Cholecalciferol 4000 UNITS CAPS Take 1 capsule by mouth daily.    Marland Kitchen esomeprazole (NEXIUM) 40 MG capsule TAKE ONE CAPSULE BY MOUTH EVERY DAY 90 capsule 2  . Flaxseed, Linseed, (FLAX SEED OIL) 1000 MG CAPS Take by mouth daily.    . Magnesium 500 MG CAPS Take by mouth daily.    . Multiple Vitamin (MULTIVITAMIN WITH MINERALS) TABS  tablet Take 1 tablet by mouth daily.    . Probiotic Product (PROBIOTIC DAILY PO) Take by mouth.    . ranitidine (ZANTAC) 300 MG tablet TAKE 1 TABLET (300 MG TOTAL) BY MOUTH 2 (TWO) TIMES DAILY. 180 tablet 1   No current facility-administered medications on file prior to visit.    Allergies:  Allergies  Allergen Reactions  . Neomycin    Medical History:  She has Vitamin D deficiency; Depression; Generalized anxiety disorder; and Medication management on her problem list.   Health Maintenance:   Immunization History  Administered Date(s) Administered  . Influenza Inj Mdck Quad With Preservative 01/09/2017  . PPD Test  12/09/2013  . Tdap 11/19/2010   Tetanus: 2012 Pneumovax: Prevnar 13:  Flu vaccine: TODAY Zostavax:  Pap: has GYN saw 2 years HAS IUD MGM: N/A DEXA: Colonoscopy: N/A EGD: N/A CXR 2015  Patient Care Team: Unk Pinto, MD as PCP - General (Internal Medicine) Unk Pinto, MD (Internal Medicine) Bobbye Charleston, MD as Consulting Physician (Obstetrics and Gynecology) Bobbye Charleston, MD as Consulting Physician (Obstetrics and Gynecology) Donnel Saxon, CNM as Midwife (Obstetrics and Gynecology) Dyke Maes, OD (Optometry) Donnel Saxon, CNM as Midwife (Obstetrics and Gynecology) Dyke Maes, OD [facility])  Surgical History:  She has a past surgical history that includes Wisdom tooth extraction and Colposcopy (2006). Family History:  Herfamily history includes Asthma in her sister; Cancer in her paternal grandfather and paternal grandmother; Diabetes in her maternal grandmother; Heart disease in her maternal grandfather; Hyperlipidemia in her mother; Hypertension in her maternal grandmother. Social History:  She reports that she quit smoking about 10 years ago. Her smoking use included Cigarettes. She smoked 1.00 pack per day. She has never used smokeless tobacco. She reports that she does not drink alcohol or use drugs.  Review of Systems: Review of Systems  Constitutional: Negative.   HENT: Negative.   Eyes: Negative.   Respiratory: Negative.   Cardiovascular: Negative.   Gastrointestinal: Positive for heartburn. Negative for abdominal pain, blood in stool, constipation, diarrhea, melena, nausea and vomiting.  Genitourinary: Negative.   Musculoskeletal: Negative.   Skin: Negative.     Physical Exam: Estimated body mass index is 36.61 kg/m as calculated from the following:   Height as of this encounter: 5\' 5"  (1.651 m).   Weight as of this encounter: 220 lb (99.8 kg). BP 124/80   Pulse 76   Temp (!) 97.3 F (36.3 C)   Resp 18   Ht 5\' 5"  (1.651  m)   Wt 220 lb (99.8 kg)   SpO2 99%   BMI 36.61 kg/m  General Appearance: Well nourished, in no apparent distress.  Eyes: PERRLA, EOMs, conjunctiva no swelling or erythema, normal fundi and vessels.  Sinuses: No Frontal/maxillary tenderness  ENT/Mouth: Ext aud canals clear, normal light reflex with TMs without erythema, bulging. Good dentition. No erythema, swelling, or exudate on post pharynx. Tonsils not swollen or erythematous. Hearing normal.  Neck: Supple, thyroid normal. No bruits  Respiratory: Respiratory effort normal, BS equal bilaterally without rales, rhonchi, wheezing or stridor.  Cardio: RRR without murmurs, rubs or gallops. Brisk peripheral pulses without edema.  Chest: symmetric, with normal excursions and percussion.  Breasts: Symmetric, without lumps, nipple discharge, retractions.  Abdomen: Soft, nontender, no guarding, rebound, hernias, masses, or organomegaly.  Lymphatics: Non tender without lymphadenopathy.  Genitourinary:  Musculoskeletal: Full ROM all peripheral extremities,5/5 strength, and normal gait.  Skin: Warm, dry without rashes, lesions, ecchymosis. Neuro: Cranial nerves intact, reflexes equal bilaterally. Normal muscle tone,  no cerebellar symptoms. Sensation intact.  Psych: Awake and oriented X 3, normal affect, Insight and Judgment appropriate.    [name] 10:07 AM [facility]

## 2017-01-09 ENCOUNTER — Encounter: Payer: Self-pay | Admitting: Physician Assistant

## 2017-01-09 ENCOUNTER — Ambulatory Visit (INDEPENDENT_AMBULATORY_CARE_PROVIDER_SITE_OTHER): Payer: 59 | Admitting: Physician Assistant

## 2017-01-09 VITALS — BP 124/80 | HR 76 | Temp 97.3°F | Resp 18 | Ht 65.0 in | Wt 220.0 lb

## 2017-01-09 DIAGNOSIS — K219 Gastro-esophageal reflux disease without esophagitis: Secondary | ICD-10-CM | POA: Insufficient documentation

## 2017-01-09 DIAGNOSIS — Z0000 Encounter for general adult medical examination without abnormal findings: Secondary | ICD-10-CM

## 2017-01-09 DIAGNOSIS — Z130 Encounter for screening for diseases of the blood and blood-forming organs and certain disorders involving the immune mechanism: Secondary | ICD-10-CM

## 2017-01-09 DIAGNOSIS — Z23 Encounter for immunization: Secondary | ICD-10-CM

## 2017-01-09 DIAGNOSIS — Z79899 Other long term (current) drug therapy: Secondary | ICD-10-CM

## 2017-01-09 DIAGNOSIS — Z13220 Encounter for screening for lipoid disorders: Secondary | ICD-10-CM

## 2017-01-09 DIAGNOSIS — F3341 Major depressive disorder, recurrent, in partial remission: Secondary | ICD-10-CM

## 2017-01-09 DIAGNOSIS — E559 Vitamin D deficiency, unspecified: Secondary | ICD-10-CM

## 2017-01-09 DIAGNOSIS — F411 Generalized anxiety disorder: Secondary | ICD-10-CM

## 2017-01-09 MED ORDER — OMEPRAZOLE 20 MG PO CPDR: 20.0000 mg | DELAYED_RELEASE_CAPSULE | Freq: Every day | ORAL | 1 refills | Status: DC

## 2017-01-09 NOTE — Patient Instructions (Addendum)
Try the melatonin 5mg -20mg  dissolvable or gummy 30 mins before bed  Here are things you can do to help with this: - Try the Flonase or Nasonex. Remember to spray each nostril twice towards the outer part of your eye.  Do not sniff but instead pinch your nose and tilt your head back to help the medicine get into your sinuses.  The best time to do this is at bedtime.Stop if you get blurred vision or nose bleeds.   -Please pick one of the over the counter allergy medications below and take it once daily for allergies.  It will also help with fluid behind ear drums. Claritin or loratadine cheapest but likely the weakest  Zyrtec or certizine at night because it can make you sleepy The strongest is allegra or fexafinadine  Cheapest at [employer], [employer], [employer] -can use decongestant over the counter, please do not use if you have high blood pressure or certain heart conditions.   if worsening HA, changes vision/speech, imbalance, weakness go to the ER  Benefiber or Citracel is good for constipation/diarrhea/irritable bowel syndrome, it helps with weight loss and can help lower your bad cholesterol. Please do 1 TBSP in the morning in water, coffee, or tea. It can take up to a month before you can see a difference with your bowel movements. It is cheapest from [employer], [employer], [employer].       Bad carbs also include fruit juice, alcohol, and sweet tea. These are empty calories that do not signal to your brain that you are full.   Please remember the good carbs are still carbs which convert into sugar. So please measure them out no more than 1/2-1 cup of rice, oatmeal, pasta, and beans  Veggies are however free foods! Pile them on.   Not all fruit is created equal. Please see the list below, the fruit at the bottom is higher in sugars than the fruit at the top. Please avoid all dried fruits.    GETTING OFF OF PPI's    Nexium/protonix/prilosec/Omeprazole/Dexilant/Aciphex are called PPI's, they are great  at healing your stomach but should only be taken for a short period of time.     Recent studies have shown that taken for a long time they  can increase the risk of osteoporosis (weakening of your bones), pneumonia, low magnesium, restless legs, Cdiff (infection that causes diarrhea), DEMENTIA and most recently kidney damage / disease / insufficiency.     Due to this information we want to try to stop the PPI but if you try to stop it abruptly this can cause rebound acid and worsening symptoms.   So this is how we want you to get off the PPI:  - Start taking the nexium/protonix/prilosec/PPI  every other day with  zantac (ranitidine) 2 x a day for 2-4 weeks - some people stay on this dosage and can not taper off further. Our main goal is to limit the dosage and amount you are taking so if you need to stay on this dose.   - then decrease the PPI to every 3 days while taking the zantac (ranitidine) 300mg  twice a day the other  days for 2-4  Weeks  - then you can try the zantac (ranitidine) 300mg  once at night or up to 2 x day as needed.  - you can continue on this once at night or stop all together  - Avoid alcohol, spicy foods, NSAIDS (aleve, ibuprofen) at this time. See foods below.   +++++++++++++++++++++++++++++++++++++++++++  Food Choices for Gastroesophageal Reflux Disease  When you have gastroesophageal reflux disease (GERD), the foods you eat and your eating habits are very important. Choosing the right foods can help ease the discomfort of GERD. WHAT GENERAL GUIDELINES DO I NEED TO FOLLOW?  Choose fruits, vegetables, whole grains, low-fat dairy products, and low-fat meat, fish, and poultry.  Limit fats such as oils, salad dressings, butter, nuts, and avocado.  Keep a food diary to identify foods that cause symptoms.  Avoid foods that cause reflux. These may be different for different people.  Eat frequent small meals instead of three large meals each day.  Eat your meals  slowly, in a relaxed setting.  Limit fried foods.  Cook foods using methods other than frying.  Avoid drinking alcohol.  Avoid drinking large amounts of liquids with your meals.  Avoid bending over or lying down until 2-3 hours after eating.   WHAT FOODS ARE NOT RECOMMENDED? The following are some foods and drinks that may worsen your symptoms:  Vegetables Tomatoes. Tomato juice. Tomato and spaghetti sauce. Chili peppers. Onion and garlic. Horseradish. Fruits Oranges, grapefruit, and lemon (fruit and juice). Meats High-fat meats, fish, and poultry. This includes hot dogs, ribs, ham, sausage, salami, and bacon. Dairy Whole milk and chocolate milk. Sour cream. Cream. Butter. Ice cream. Cream cheese.  Beverages Coffee and tea, with or without caffeine. Carbonated beverages or energy drinks. Condiments Hot sauce. Barbecue sauce.  Sweets/Desserts Chocolate and cocoa. Donuts. Peppermint and spearmint. Fats and Oils High-fat foods, including [place] fries and potato chips. Other Vinegar. Strong spices, such as black pepper, white pepper, red pepper, cayenne, curry powder, cloves, ginger, and chili powder. Nexium/protonix/prilosec are called PPI's, they are great at healing your stomach but should only be taken for a short period of time.

## 2017-01-10 LAB — HEPATIC FUNCTION PANEL
AG Ratio: 1.6 (calc) (ref 1.0–2.5)
ALKALINE PHOSPHATASE (APISO): 52 U/L (ref 33–115)
ALT: 13 U/L (ref 6–29)
AST: 17 U/L (ref 10–30)
Albumin: 4 g/dL (ref 3.6–5.1)
Bilirubin, Direct: 0.1 mg/dL (ref 0.0–0.2)
Globulin: 2.5 g/dL (calc) (ref 1.9–3.7)
Indirect Bilirubin: 0.3 mg/dL (calc) (ref 0.2–1.2)
TOTAL PROTEIN: 6.5 g/dL (ref 6.1–8.1)
Total Bilirubin: 0.4 mg/dL (ref 0.2–1.2)

## 2017-01-10 LAB — IRON, TOTAL/TOTAL IRON BINDING CAP
%SAT: 32 % (ref 11–50)
Iron: 107 ug/dL (ref 40–190)
TIBC: 332 ug/dL (ref 250–450)

## 2017-01-10 LAB — CBC WITH DIFFERENTIAL/PLATELET
BASOS PCT: 0.4 %
Basophils Absolute: 33 cells/uL (ref 0–200)
EOS ABS: 312 {cells}/uL (ref 15–500)
Eosinophils Relative: 3.8 %
HCT: 39.4 % (ref 35.0–45.0)
Hemoglobin: 12.9 g/dL (ref 11.7–15.5)
Lymphs Abs: 2452 cells/uL (ref 850–3900)
MCH: 28.4 pg (ref 27.0–33.0)
MCHC: 32.7 g/dL (ref 32.0–36.0)
MCV: 86.8 fL (ref 80.0–100.0)
MONOS PCT: 7.6 %
MPV: 10.5 fL (ref 7.5–12.5)
Neutro Abs: 4781 cells/uL (ref 1500–7800)
Neutrophils Relative %: 58.3 %
PLATELETS: 255 10*3/uL (ref 140–400)
RBC: 4.54 10*6/uL (ref 3.80–5.10)
RDW: 13 % (ref 11.0–15.0)
TOTAL LYMPHOCYTE: 29.9 %
WBC mixed population: 623 cells/uL (ref 200–950)
WBC: 8.2 10*3/uL (ref 3.8–10.8)

## 2017-01-10 LAB — TSH: TSH: 1.38 m[IU]/L

## 2017-01-10 LAB — BASIC METABOLIC PANEL WITH GFR
BUN: 13 mg/dL (ref 7–25)
CHLORIDE: 106 mmol/L (ref 98–110)
CO2: 24 mmol/L (ref 20–32)
CREATININE: 0.7 mg/dL (ref 0.50–1.10)
Calcium: 8.8 mg/dL (ref 8.6–10.2)
GFR, Est African American: 133 mL/min/{1.73_m2} (ref >=60)
GFR, Est Non African American: 115 mL/min/{1.73_m2} (ref >=60)
GLUCOSE: 82 mg/dL (ref 65–99)
POTASSIUM: 4.2 mmol/L (ref 3.5–5.3)
SODIUM: 138 mmol/L (ref 135–146)

## 2017-01-10 LAB — LIPID PANEL
CHOLESTEROL: 218 mg/dL — AB (ref <200)
HDL: 56 mg/dL (ref >50)
LDL Cholesterol (Calc): 131 mg/dL (calc) — ABNORMAL HIGH
Non-HDL Cholesterol (Calc): 162 mg/dL (calc) — ABNORMAL HIGH (ref <130)
Total CHOL/HDL Ratio: 3.9 (calc) (ref <5.0)
Triglycerides: 168 mg/dL — ABNORMAL HIGH (ref <150)

## 2017-01-10 LAB — VITAMIN B12: VITAMIN B 12: 328 pg/mL (ref 200–1100)

## 2017-01-10 LAB — VITAMIN D 25 HYDROXY (VIT D DEFICIENCY, FRACTURES): Vit D, 25-Hydroxy: 28 ng/mL — ABNORMAL LOW (ref 30–100)

## 2017-01-10 LAB — MAGNESIUM: Magnesium: 2 mg/dL (ref 1.5–2.5)

## 2017-01-16 ENCOUNTER — Other Ambulatory Visit: Payer: Self-pay | Admitting: Adult Health

## 2017-01-16 ENCOUNTER — Other Ambulatory Visit: Payer: Self-pay

## 2017-01-16 DIAGNOSIS — J069 Acute upper respiratory infection, unspecified: Secondary | ICD-10-CM

## 2017-01-16 MED ORDER — AZELASTINE HCL 0.1 % NA SOLN: 2.0000 | Freq: Two times a day (BID) | NASAL | 2 refills | Status: DC

## 2017-04-30 ENCOUNTER — Encounter: Payer: Self-pay | Admitting: Adult Health

## 2017-04-30 ENCOUNTER — Ambulatory Visit (HOSPITAL_COMMUNITY)
Admission: RE | Admit: 2017-04-30 | Discharge: 2017-04-30 | Disposition: A | Discharge status: Home / Self Care | Payer: BLUE CROSS/BLUE SHIELD | Source: Ambulatory Visit | Attending: Adult Health | Admitting: Adult Health

## 2017-04-30 ENCOUNTER — Ambulatory Visit (INDEPENDENT_AMBULATORY_CARE_PROVIDER_SITE_OTHER): Payer: BLUE CROSS/BLUE SHIELD | Admitting: Adult Health

## 2017-04-30 VITALS — BP 118/82 | HR 70 | Temp 97.7°F | Ht 65.0 in | Wt 212.0 lb

## 2017-04-30 DIAGNOSIS — M62838 Other muscle spasm: Secondary | ICD-10-CM | POA: Diagnosis not present

## 2017-04-30 DIAGNOSIS — M542 Cervicalgia: Secondary | ICD-10-CM | POA: Diagnosis not present

## 2017-04-30 MED ORDER — CYCLOBENZAPRINE HCL 5 MG PO TABS: 5.0000 mg | ORAL_TABLET | Freq: Three times a day (TID) | ORAL | 0 refills | Status: DC | PRN

## 2017-04-30 MED ORDER — PREDNISONE 20 MG PO TABS: ORAL_TABLET | ORAL | 0 refills | Status: DC

## 2017-04-30 NOTE — Progress Notes (Signed)
Assessment and Plan:  Angela Dixon was seen today for neck pain.  Diagnoses and all orders for this visit:  Neck pain Without radicular symptoms; neck strain vs muscle spasm Continue NSAIDs, heat Get a new cervical support pillow at night -     predniSONE (DELTASONE) 20 MG tablet; 2 tablets daily for 3 days, 1 tablet daily for 4 days. -     cyclobenzaprine (FLEXERIL) 5 MG tablet; Take 1 tablet (5 mg total) by mouth 3 (three) times daily as needed for muscle spasms. -     DG Cervical Spine Complete; Future - XRay resulted and unremarkable -     Ambulatory referral to Physical Therapy Refer to ortho if not improving in 1-3 months  Muscle spasms of neck -     Ambulatory referral to Physical Therapy  Further disposition pending results of labs. Discussed med's effects and SE's.   Over 15 minutes of exam, counseling, chart review, and critical decision making was performed.   Future Appointments  Date Time Provider [place]  01/19/2018 10:00 AM [name], PA-C [id number] None    ------------------------------------------------------------------------------------------------------------------   HPI BP 118/82   Pulse 70   Temp 97.7 F (36.5 C)   Ht 5\' 5"  (1.651 m)   Wt 212 lb (96.2 kg)   SpO2 98%   BMI 35.28 kg/m   32 y.o.female presents for intermittent pain of left side of neck and shoulder - reports hx of intermittent ongoing since pregnancy in 2014. She reports she has been having issues more frequently, has had 4 "episodes" in recent months. These seem to be associated with sleeping position - typically starts during the night and wakes up with stiffness and pain for a few days to a week. She denies numbness, tingling, weakness of upper extremities, HA, vision changes.   Has been trying aleve, OTC heat patches but has not been particularly helpful. Has invested in new mattress but not a cervical support pillow.   Past Medical History:  Diagnosis Date  . Abnormal  Pap smear   . Acid reflux   . Anemia   . Chlamydia 2006  . Fracture   . Hypercholesterolemia   . Vitamin D deficiency      Allergies  Allergen Reactions  . Neomycin Rash    Current Outpatient Medications on File Prior to Visit  Medication Sig  . albuterol (PROVENTIL HFA;VENTOLIN HFA) 108 (90 Base) MCG/ACT inhaler Inhale 2 puffs into the lungs every 4 hours as needed for wheezing or shortness of breath.  Marland Kitchen azelastine (ASTELIN) 0.1 % nasal spray Place 2 sprays into both nostrils 2 (two) times daily. Use in each nostril as directed  . cetirizine (ZYRTEC) 10 MG tablet Take 10 mg by mouth daily.  . Cholecalciferol 4000 UNITS CAPS Take 1 capsule by mouth daily.  . Flaxseed, Linseed, (FLAX SEED OIL) 1000 MG CAPS Take by mouth daily.  . Magnesium 500 MG CAPS Take by mouth daily.  . Multiple Vitamin (MULTIVITAMIN WITH MINERALS) TABS tablet Take 1 tablet by mouth daily.  Marland Kitchen omeprazole (PRILOSEC) 20 MG capsule Take 1 capsule (20 mg total) by mouth daily.  . Probiotic Product (PROBIOTIC DAILY PO) Take by mouth.  . ranitidine (ZANTAC) 300 MG tablet TAKE 1 TABLET (300 MG TOTAL) BY MOUTH 2 (TWO) TIMES DAILY. (Patient not taking: Reported on 04/30/2017)   No current facility-administered medications on file prior to visit.     ROS: all negative except above.   Physical Exam:  BP 118/82   Pulse  70   Temp 97.7 F (36.5 C)   Ht 5\' 5"  (1.651 m)   Wt 212 lb (96.2 kg)   SpO2 98%   BMI 35.28 kg/m   General Appearance: Well nourished, in no apparent distress. Eyes: PERRLA, EOMs, conjunctiva no swelling or erythema Neck: Supple.  Respiratory: Respiratory effort normal, BS equal bilaterally without rales, rhonchi, wheezing or stridor.  Cardio: RRR with no MRGs.  Lymphatics: Non tender without lymphadenopathy.  Musculoskeletal: Full ROM, 5/5 strength, normal gait. No spinous tenderness, crepitus or other palpable bony abnormalities. Tenderness over left cervical paraspinals and trapezius; pain  worse with anterior flexion of neck against resistance.  Skin: Warm, dry without rashes, lesions, ecchymosis.  Neuro: Cranial nerves intact. Normal muscle tone, no cerebellar symptoms. Sensation intact.  Psych: Awake and oriented X 3, normal affect, Insight and Judgment appropriate.     [name], NP 11:01 AM [facility]

## 2017-04-30 NOTE — Patient Instructions (Addendum)
Try the exercises and other information below, prednisone taper as prescribed, then flexeril if needed at bedtime for muscle spasm. This can be taken up to every 8 hours, but causes sedation, so should not drive or operate heavy machinery while taking this medicine.   Go to the ER if you have any new weakness in your arms, trouble with your grip, worse headache ever, fever, chills. or have worsening pain.   If you are not better in 1-3 month we will refer you to ortho   Cervical Sprain A cervical sprain is a stretch or tear in one or more of the tough, cord-like tissues that connect bones (ligaments) in the neck. Cervical sprains can range from mild to severe. Severe cervical sprains can cause the spinal bones (vertebrae) in the neck to be unstable. This can lead to spinal cord damage and can result in serious nervous system problems. The amount of time that it takes for a cervical sprain to get better depends on the cause and extent of the injury. Most cervical sprains heal in 4-6 weeks. What are the causes? Cervical sprains may be caused by an injury (trauma), such as from a motor vehicle accident, a fall, or sudden forward and backward whipping movement of the head and neck (whiplash injury). Mild cervical sprains may be caused by wear and tear over time, such as from poor posture, sitting in a chair that does not provide support, or looking up or down for long periods of time. What increases the risk? The following factors may make you more likely to develop this condition:  Participating in activities that have a high risk of trauma to the neck. These include contact sports, auto racing, gymnastics, and diving.  Taking risks when driving or riding in a motor vehicle, such as speeding.  Having osteoarthritis of the spine.  Having poor strength and flexibility of the neck.  A previous neck injury.  Having poor posture.  Spending a lot of time in certain positions that put stress on the  neck, such as sitting at a computer for long periods of time.  What are the signs or symptoms? Symptoms of this condition include:  Pain, soreness, stiffness, tenderness, swelling, or a burning sensation in the front, back, or sides of the neck.  Sudden tightening of neck muscles that you cannot control (muscle spasms).  Pain in the shoulders or upper back.  Limited ability to move the neck.  Headache.  Dizziness.  Nausea.  Vomiting.  Weakness, numbness, or tingling in a hand or an arm.  Symptoms may develop right away after injury, or they may develop over a few days. In some cases, symptoms may go away with treatment and return (recur) over time. How is this diagnosed? This condition may be diagnosed based on:  Your medical history.  Your symptoms.  Any recent injuries or known neck problems that you have, such as arthritis in the neck.  A physical exam.  Imaging tests, such as: ? X-rays. ? MRI. ? CT scan.  How is this treated? This condition is treated by resting and icing the injured area and doing physical therapy exercises. Depending on the severity of your condition, treatment may also include:  Keeping your neck in place (immobilized) for periods of time. This may be done using: ? A cervical collar. This supports your chin and the back of your head. ? A cervical traction device. This is a sling that holds up your head. This removes weight and pressure from  your neck, and it may help to relieve pain.  Medicines that help to relieve pain and inflammation.  Medicines that help to relax your muscles (muscle relaxants).  Surgery. This is rare.  Follow these instructions at home: If you have a cervical collar:  Wear it as told by your health care provider. Do not remove the collar unless instructed by your health care provider.  Ask your health care provider before you make any adjustments to your collar.  If you have long hair, keep it outside of the  collar.  Ask your health care provider if you can remove the collar for cleaning and bathing. If you are allowed to remove the collar for cleaning or bathing: ? Follow instructions from your health care provider about how to remove the collar safely. ? Clean the collar by wiping it with mild soap and water and drying it completely. ? If your collar has removable pads, remove them every 1-2 days and wash them by hand with soap and water. Let them air-dry completely before you put them back in the collar. ? Check your skin under the collar for irritation or sores. If you see any, tell your health care provider. Managing pain, stiffness, and swelling  If directed, use a cervical traction device as told by your health care provider.  If directed, apply heat to the affected area before you do your physical therapy or as often as told by your health care provider. Use the heat source that your health care provider recommends, such as a moist heat pack or a heating pad. ? Place a towel between your skin and the heat source. ? Leave the heat on for 20-30 minutes. ? Remove the heat if your skin turns bright red. This is especially important if you are unable to feel pain, heat, or cold. You may have a greater risk of getting burned.  If directed, put ice on the affected area: ? Put ice in a plastic bag. ? Place a towel between your skin and the bag. ? Leave the ice on for 20 minutes, 2-3 times a day. Activity  Do not drive while wearing a cervical collar. If you do not have a cervical collar, ask your health care provider if it is safe to drive while your neck heals.  Do not drive or use heavy machinery while taking prescription pain medicine or muscle relaxants, unless your health care provider approves.  Do not lift anything that is heavier than 10 lb (4.5 kg) until your health care provider tells you that it is safe.  Rest as directed by your health care provider. Avoid positions and activities  that make your symptoms worse. Ask your health care provider what activities are safe for you.  If physical therapy was prescribed, do exercises as told by your health care provider or physical therapist. General instructions  Take over-the-counter and prescription medicines only as told by your health care provider.  Do not use any products that contain nicotine or tobacco, such as cigarettes and e-cigarettes. These can delay healing. If you need help quitting, ask your health care provider.  Keep all follow-up visits as told by your health care provider or physical therapist. This is important. How is this prevented? To prevent a cervical sprain from happening again:  Use and maintain good posture. Make any needed adjustments to your workstation to help you use good posture.  Exercise regularly as directed by your health care provider or physical therapist.  Avoid risky activities  that may cause a cervical sprain.  Contact a health care provider if:  You have symptoms that get worse or do not get better after 2 weeks of treatment.  You have pain that gets worse or does not get better with medicine.  You develop new, unexplained symptoms.  You have sores or irritated skin on your neck from wearing your cervical collar. Get help right away if:  You have severe pain.  You develop numbness, tingling, or weakness in any part of your body.  You cannot move a part of your body (you have paralysis).  You have neck pain along with: ? Severe dizziness. ? Headache. Summary  A cervical sprain is a stretch or tear in one or more of the tough, cord-like tissues that connect bones (ligaments) in the neck.  Cervical sprains may be caused by an injury (trauma), such as from a motor vehicle accident, a fall, or sudden forward and backward whipping movement of the head and neck (whiplash injury).  Symptoms may develop right away after injury, or they may develop over a few days.  This  condition is treated by resting and icing the injured area and doing physical therapy exercises. This information is not intended to replace advice given to you by your health care provider. Make sure you discuss any questions you have with your health care provider. Document Released: 01/20/2007 Document Revised: 11/22/2015 Document Reviewed: 11/22/2015 Elsevier Interactive Patient Education  2017 [employer]   Cervical Strain and Sprain Rehab Ask your health care provider which exercises are safe for you. Do exercises exactly as told by your health care provider and adjust them as directed. It is normal to feel mild stretching, pulling, tightness, or discomfort as you do these exercises, but you should stop right away if you feel sudden pain or your pain gets worse.Do not begin these exercises until told by your health care provider. Stretching and range of motion exercises These exercises warm up your muscles and joints and improve the movement and flexibility of your neck. These exercises also help to relieve pain, numbness, and tingling. Exercise A: Cervical side bend  1. Using good posture, sit on a stable chair or stand up. 2. Without moving your shoulders, slowly tilt your left / right ear to your shoulder until you feel a stretch in your neck muscles. You should be looking straight ahead. 3. Hold for __________ seconds. 4. Repeat with the other side of your neck. Repeat __________ times. Complete this exercise __________ times a day. Exercise B: Cervical rotation  1. Using good posture, sit on a stable chair or stand up. 2. Slowly turn your head to the side as if you are looking over your left / right shoulder. ? Keep your eyes level with the ground. ? Stop when you feel a stretch along the side and the back of your neck. 3. Hold for __________ seconds. 4. Repeat this by turning to your other side. Repeat __________ times. Complete this exercise __________ times a day. Exercise  C: Thoracic extension and pectoral stretch 1. Roll a towel or a small blanket so it is about 4 inches (10 cm) in diameter. 2. Lie down on your back on a firm surface. 3. Put the towel lengthwise, under your spine in the middle of your back. It should not be not under your shoulder blades. The towel should line up with your spine from your middle back to your lower back. 4. Put your hands behind your head and let  your elbows fall out to your sides. 5. Hold for __________ seconds. Repeat __________ times. Complete this exercise __________ times a day. Strengthening exercises These exercises build strength and endurance in your neck. Endurance is the ability to use your muscles for a long time, even after your muscles get tired. Exercise D: Upper cervical flexion, isometric 1. Lie on your back with a thin pillow behind your head and a small rolled-up towel under your neck. 2. Gently tuck your chin toward your chest and nod your head down to look toward your feet. Do not lift your head off the pillow. 3. Hold for __________ seconds. 4. Release the tension slowly. Relax your neck muscles completely before you repeat this exercise. Repeat __________ times. Complete this exercise __________ times a day. Exercise E: Cervical extension, isometric  1. Stand about 6 inches (15 cm) away from a wall, with your back facing the wall. 2. Place a soft object, about 6-8 inches (15-20 cm) in diameter, between the back of your head and the wall. A soft object could be a small pillow, a ball, or a folded towel. 3. Gently tilt your head back and press into the soft object. Keep your jaw and forehead relaxed. 4. Hold for __________ seconds. 5. Release the tension slowly. Relax your neck muscles completely before you repeat this exercise. Repeat __________ times. Complete this exercise __________ times a day. Posture and body mechanics  Body mechanics refers to the movements and positions of your body while you do  your daily activities. Posture is part of body mechanics. Good posture and healthy body mechanics can help to relieve stress in your body's tissues and joints. Good posture means that your spine is in its natural S-curve position (your spine is neutral), your shoulders are pulled back slightly, and your head is not tipped forward. The following are general guidelines for applying improved posture and body mechanics to your everyday activities. Standing  When standing, keep your spine neutral and keep your feet about hip-width apart. Keep a slight bend in your knees. Your ears, shoulders, and hips should line up.  When you do a task in which you stand in one place for a long time, place one foot up on a stable object that is 2-4 inches (5-10 cm) high, such as a footstool. This helps keep your spine neutral. Sitting   When sitting, keep your spine neutral and your keep feet flat on the floor. Use a footrest, if necessary, and keep your thighs parallel to the floor. Avoid rounding your shoulders, and avoid tilting your head forward.  When working at a desk or a computer, keep your desk at a height where your hands are slightly lower than your elbows. Slide your chair under your desk so you are close enough to maintain good posture.  When working at a computer, place your monitor at a height where you are looking straight ahead and you do not have to tilt your head forward or downward to look at the screen. Resting When lying down and resting, avoid positions that are most painful for you. Try to support your neck in a neutral position. You can use a contour pillow or a small rolled-up towel. Your pillow should support your neck but not push on it. This information is not intended to replace advice given to you by your health care provider. Make sure you discuss any questions you have with your health care provider. Document Released: 03/25/2005 Document Revised: 11/30/2015 Document Reviewed:  03/01/2015  Elsevier Interactive Patient Education  [employer].      Musculoskeletal Pain Musculoskeletal pain is muscle and bone aches and pains. This pain can occur in any part of the body. Follow these instructions at home:  Only take medicines for pain, discomfort, or fever as told by your health care provider.  You may continue all activities unless the activities cause more pain. When the pain lessens, slowly resume normal activities. Gradually increase the intensity and duration of the activities or exercise.  During periods of severe pain, bed rest may be helpful. Lie or sit in any position that is comfortable, but get out of bed and walk around at least every several hours.  If directed, put ice on the injured area. ? Put ice in a plastic bag. ? Place a towel between your skin and the bag. ? Leave the ice on for 20 minutes, 2-3 times a day. Contact a health care provider if:  Your pain is getting worse.  Your pain is not relieved with medicines.  You lose function in the area of the pain if the pain is in your arms, legs, or neck. This information is not intended to replace advice given to you by your health care provider. Make sure you discuss any questions you have with your health care provider. Document Released: 03/25/2005 Document Revised: 09/05/2015 Document Reviewed: 11/27/2012 Elsevier Interactive Patient Education  2017 [employer].

## 2017-05-05 ENCOUNTER — Encounter: Payer: Self-pay | Admitting: Physical Therapy

## 2017-05-05 ENCOUNTER — Ambulatory Visit: Payer: BLUE CROSS/BLUE SHIELD | Attending: Adult Health | Admitting: Physical Therapy

## 2017-05-05 ENCOUNTER — Other Ambulatory Visit: Payer: Self-pay

## 2017-05-05 DIAGNOSIS — M6281 Muscle weakness (generalized): Secondary | ICD-10-CM | POA: Insufficient documentation

## 2017-05-05 DIAGNOSIS — M62838 Other muscle spasm: Secondary | ICD-10-CM | POA: Diagnosis not present

## 2017-05-05 DIAGNOSIS — M542 Cervicalgia: Secondary | ICD-10-CM | POA: Diagnosis not present

## 2017-05-05 NOTE — Therapy (Signed)
[facility] [address], [place], [place], [address] Phone: [phone]   Fax:  [phone]  Physical Therapy Evaluation  Patient Details  Name: Angela Dixon MRN: [id number] Date of Birth: 12-Jul-1985 Referring Provider: Dr. Liane Comber   Encounter Date: 05/05/2017  PT End of Session - 05/05/17 1307    Visit Number  1    Date for PT Re-Evaluation  06/30/17    Authorization Type  [employer]    Authorization - Visit Number  1    Authorization - Number of Visits  60    PT Start Time  1217    PT Stop Time  1305    PT Time Calculation (min)  48 min    Activity Tolerance  Patient tolerated treatment well    Behavior During Therapy  [facility] for tasks assessed/performed       Past Medical History:  Diagnosis Date  . Abnormal Pap smear   . Acid reflux   . Anemia   . Chlamydia 2006  . Fracture   . Hypercholesterolemia   . Vitamin D deficiency     Past Surgical History:  Procedure Laterality Date  . COLPOSCOPY  2006  . WISDOM TOOTH EXTRACTION      There were no vitals filed for this visit.   Subjective Assessment - 05/05/17 1224    Subjective  Patient reports her cervical pain began the past 2-3 months with sudden onset.  No numbness or tingling down her arms.     Patient Stated Goals  improve pain and flexibility    Currently in Pain?  Yes    Pain Score  8     Pain Location  Neck    Pain Orientation  Left    Pain Descriptors / Indicators  Sharp;Stabbing    Pain Type  Acute pain    Pain Radiating Towards  to left shoulder    Pain Onset  More than a month ago    Pain Frequency  Intermittent    Aggravating Factors   sleep, turning head    Pain Relieving Factors  heat, flexeril,     Multiple Pain Sites  No         [facility] PT Assessment - 05/05/17 0001      Assessment   Medical Diagnosis  M54.2 Neck pain; M62.838 Muscle spasms of neck    Referring Provider  Dr. Liane Comber    Onset Date/Surgical Date   03/05/17    Hand Dominance  Right    Prior Therapy  none      Precautions   Precautions  None      Restrictions   Weight Bearing Restrictions  No      Balance Screen   Has the patient fallen in the past 6 months  No    Has the patient had a decrease in activity level because of a fear of falling?   No    Is the patient reluctant to leave their home because of a fear of falling?   No      Home Film/video editor residence      Prior Function   Level of Independence  Independent    Vocation  Full time employment    Vocation Requirements  sit at a computer    Leisure  walk 3 times per week      Cognition   Overall Cognitive Status  Within Functional Limits for tasks assessed  Observation/Other Assessments   Focus on Therapeutic Outcomes (FOTO)   46% limitation goal 29% limitation      Posture/Postural Control   Posture/Postural Control  Postural limitations    Postural Limitations  Rounded Shoulders;Forward head      ROM / Strength   AROM / PROM / Strength  AROM;PROM;Strength      AROM   AROM Assessment Site  Cervical    Cervical Flexion  25    Cervical Extension  50    Cervical - Right Side Bend  30    Cervical - Left Side Bend  20    Cervical - Right Rotation  58    Cervical - Left Rotation  45      Strength   Right Shoulder Internal Rotation  4/5    Right Shoulder External Rotation  4/5    Left Shoulder Internal Rotation  4/5    Left Shoulder External Rotation  4/5      Palpation   Spinal mobility  decreased mobility of C3-C5 with sideglide to right; decreased mobility of T1-T4    Palpation comment  tenderness located bilateral cervical paraspinals, suboccipitals, upper trap      Transfers   Transfers  Not assessed      Ambulation/Gait   Ambulation/Gait  No             Objective measurements completed on examination: See above findings.      [facility] Adult PT Treatment/Exercise - 05/05/17 0001      Manual Therapy    Manual Therapy  Soft tissue mobilization    Soft tissue mobilization  bil. cervical paraspinals and interscapular area       Trigger Point Dry Needling - 05/05/17 1305    Consent Given?  Yes    Education Handout Provided  Yes    Muscles Treated Upper Body  Upper trapezius bil. cervical multifidi    Upper Trapezius Response  Palpable increased muscle length;Twitch reponse elicited           PT Education - 05/05/17 1306    Education provided  Yes    Education Details  information on dry needling; cervical stretches; bil. shoulder extension strength    Person(s) Educated  Patient    Methods  Explanation;Demonstration;Verbal cues;Handout    Comprehension  Returned demonstration;Verbalized understanding       PT Short Term Goals - 05/05/17 1313      PT SHORT TERM GOAL #1   Title  independent with initial HEP    Time  4    Period  Weeks    Status  New    Target Date  06/02/17      PT SHORT TERM GOAL #2   Title  understands ways to sleep uisng pillows to support cervical to decreased pain when she wakes up    Time  4    Period  Weeks    Target Date  06/02/17        PT Long Term Goals - 05/05/17 1258      PT LONG TERM GOAL #1   Title  independent with HEP    Time  8    Period  Weeks    Status  New    Target Date  06/30/17      PT LONG TERM GOAL #2   Title  when wake up with cervical pain decreased >/= 75%    Time  8    Period  Weeks    Status  New  Target Date  06/30/17      PT LONG TERM GOAL #3   Title  driving with cervical pain decreased >/= 75% due to improved cervical mobility    Time  8    Period  Weeks    Status  New    Target Date  06/30/17      PT LONG TERM GOAL #4   Title  understand correct posture with work to decrease strain on neck    Time  8    Period  Weeks    Status  New    Target Date  06/30/17      PT LONG TERM GOAL #5   Title  FOTO score </= 29% limitation    Time  8    Period  Weeks    Status  New    Target Date  06/30/17       Additional Long Term Goals   Additional Long Term Goals  Yes      PT LONG TERM GOAL #6   Title  walking with pain decreased >/= 75% due to increased cervical thoracic strength    Time  8    Period  Weeks    Status  New    Target Date  06/30/17             Plan - 05/05/17 1307    Clinical Impression Statement  Patient is a 32 year old female with sudden onset of cervical pain 3 months ago.  Patient reports intermittent pain 8/10 in cerivcal and interscapular area.  Patient reports no numbness or tingling.  Patient has decreased cervical ROM.  Patient  has weakness in bilateral shoulder rotators.  Patient has decreased mobility  of C3-C6 and T1-T4.  Palpable tenderness located in cervical paraspinals, subocciptials, and interscapular area.  Patient will benefit from skilled therapy to improve cervical and thoracic mobility with reduction of pain and improvement of pain.     History and Personal Factors relevant to plan of care:  None    Clinical Presentation  Stable    Clinical Presentation due to:  stable condition    Clinical Decision Making  Low    Rehab Potential  Excellent    Clinical Impairments Affecting Rehab Potential  None    PT Frequency  1x / week    PT Duration  8 weeks    PT Treatment/Interventions  Cryotherapy;Electrical Stimulation;Moist Heat;Traction;Ultrasound;Therapeutic exercise;Therapeutic activities;Neuromuscular re-education;Patient/family education;Passive range of motion;Manual techniques;Dry needling    PT Next Visit Plan  see how her reaction is to dry needling; using a tennis ball on trigger points, scapular stretches, cervical ROM, interscapular strength    PT Home Exercise Plan  progress as needed    Consulted and Agree with Plan of Care  Patient       Patient will benefit from skilled therapeutic intervention in order to improve the following deficits and impairments:  Pain, Increased fascial restricitons, Decreased mobility, Increased muscle  spasms, Decreased endurance, Decreased activity tolerance, Decreased range of motion, Decreased strength  Visit Diagnosis: Cervicalgia - Plan: PT plan of care cert/re-cert  Other muscle spasm - Plan: PT plan of care cert/re-cert  Muscle weakness (generalized) - Plan: PT plan of care cert/re-cert     Problem List Patient Active Problem List   Diagnosis Date Noted  . GERD (gastroesophageal reflux disease) 01/09/2017  . Medication management 01/31/2015  . Depression 02/08/2014  . Generalized anxiety disorder 02/08/2014  . Vitamin D deficiency     Earlie Counts,  PT 05/05/17 1:16 PM   [facility] [address] [place], [place], [address] Phone: [phone]   Fax:  [phone]  Name: Angela Dixon MRN: [id number] Date of Birth: [birth year]

## 2017-05-05 NOTE — Patient Instructions (Addendum)
Trigger Point Dry Needling  . What is Trigger Point Dry Needling (DN)? o DN is a physical therapy technique used to treat muscle pain and dysfunction. Specifically, DN helps deactivate muscle trigger points (muscle knots).  o A thin filiform needle is used to penetrate the skin and stimulate the underlying trigger point. The goal is for a local twitch response (LTR) to occur and for the trigger point to relax. No medication of any kind is injected during the procedure.   . What Does Trigger Point Dry Needling Feel Like?  o The procedure feels different for each individual patient. Some patients report that they do not actually feel the needle enter the skin and overall the process is not painful. Very mild bleeding may occur. However, many patients feel a deep cramping in the muscle in which the needle was inserted. This is the local twitch response.   Marland Kitchen How Will I feel after the treatment? o Soreness is normal, and the onset of soreness may not occur for a few hours. Typically this soreness does not last longer than two days.  o Bruising is uncommon, however; ice can be used to decrease any possible bruising.  o In rare cases feeling tired or nauseous after the treatment is normal. In addition, your symptoms may get worse before they get better, this period will typically not last longer than 24 hours.   . What Can I do After My Treatment? o Increase your hydration by drinking more water for the next 24 hours. o You may place ice or heat on the areas treated that have become sore, however, do not use heat on inflamed or bruised areas. Heat often brings more relief post needling. o You can continue your regular activities, but vigorous activity is not recommended initially after the treatment for 24 hours. o DN is best combined with other physical therapy such as strengthening, stretching, and other therapies.    [facility] [address] [place], [place]  [address] Phone # [phone] Fax [phone] Flexibility: Neck Stretch    Grasp left arm above wrist and pull down across body while gently tilting head same direction. Hold __15__ seconds. Relax. Repeat __2__ times per set. Do ___1_ sets per session. Do __1__ sessions per day.  http://orth.exer.us/346   Copyright  VHI. All rights reserved.  Chest / Bicep Stretch    Lace fingers behind back and squeeze shoulder blades together. Slowly raise and straighten arms. Hold _15___ seconds. Repeat __2__ times per set. Do _1___ sets per session. Do __1__ sessions per day.  http://orth.exer.us/352   Copyright  VHI. All rights reserved.   Scapular Retraction: Elbow Flexion (Standing)   With elbows bent to 90, pinch shoulder blades together and rotate arms out, keeping elbows bent. Repeat _10___ times per set. Do _1___ sets per session. Do many_1___ sessions per day.    Strengthening: Resisted Extension   Hold tubing in right hand, arm forward. Pull arm back, elbow straight. Repeat _10___ times per set. Do _2___ sets per session. Do _1-2___ sessions per day.  Can place band around the front of your body and perform with both arms at the same time. [facility] [address], [place], [place] [address] Phone # [phone] Fax [phone]

## 2017-05-06 ENCOUNTER — Encounter: Payer: Self-pay | Admitting: Adult Health

## 2017-05-20 ENCOUNTER — Encounter: Payer: Self-pay | Admitting: Physical Therapy

## 2017-05-26 ENCOUNTER — Ambulatory Visit: Payer: BLUE CROSS/BLUE SHIELD | Attending: Adult Health | Admitting: Physical Therapy

## 2017-05-26 ENCOUNTER — Encounter: Payer: Self-pay | Admitting: Physical Therapy

## 2017-05-26 DIAGNOSIS — M542 Cervicalgia: Secondary | ICD-10-CM | POA: Diagnosis not present

## 2017-05-26 DIAGNOSIS — M6281 Muscle weakness (generalized): Secondary | ICD-10-CM | POA: Diagnosis not present

## 2017-05-26 DIAGNOSIS — M62838 Other muscle spasm: Secondary | ICD-10-CM

## 2017-05-26 NOTE — Therapy (Signed)
[facility] [address], [place], [place], [address] Phone: [phone]   Fax:  [phone]  Physical Therapy Treatment  Patient Details  Name: Angela Dixon MRN: [id number] Date of Birth: December 23, 1985 Referring Provider: Dr. Liane Comber   Encounter Date: 05/26/2017  PT End of Session - 05/26/17 0842    Visit Number  2    Authorization Type  BCBS    Authorization - Visit Number  2    Authorization - Number of Visits  60    PT Start Time  0800    PT Stop Time  0840    PT Time Calculation (min)  40 min    Activity Tolerance  Patient tolerated treatment well    Behavior During Therapy  [facility] for tasks assessed/performed       Past Medical History:  Diagnosis Date  . Abnormal Pap smear   . Acid reflux   . Anemia   . Chlamydia 2006  . Fracture   . Hypercholesterolemia   . Vitamin D deficiency     Past Surgical History:  Procedure Laterality Date  . COLPOSCOPY  2006  . WISDOM TOOTH EXTRACTION      There were no vitals filed for this visit.  Subjective Assessment - 05/26/17 0803    Subjective  The dry needling has helping.     Patient Stated Goals  improve pain and flexibility    Currently in Pain?  Yes    Pain Score  3     Pain Location  Neck    Pain Orientation  Left    Pain Descriptors / Indicators  Tightness    Pain Type  Acute pain    Pain Onset  More than a month ago    Pain Frequency  Intermittent    Aggravating Factors   sleep, turning head    Pain Relieving Factors  heat flexerill    Multiple Pain Sites  No                      [facility] Adult PT Treatment/Exercise - 05/26/17 0001      Exercises   Exercises  Neck      Neck Exercises: Supine   Other Supine Exercise  supine with head on foam roll doing the MELT method, using a tennis ball inot the left upper trap to massage      Manual Therapy   Manual Therapy  Soft tissue mobilization    Soft tissue mobilization  bil.  cervical paraspinals, subocciptials, upper trap and levator       Trigger Point Dry Needling - 05/26/17 0818    Consent Given?  Yes    Muscles Treated Upper Body  Upper trapezius;Levator scapulae;Suboccipitals muscle group C6-T2 multifidi bil.     Upper Trapezius Response  Twitch reponse elicited;Palpable increased muscle length    SubOccipitals Response  Twitch response elicited;Palpable increased muscle length    Levator Scapulae Response  Twitch response elicited;Palpable increased muscle length           PT Education - 05/26/17 0841    Education provided  Yes    Education Details  using a tennis ball to work on triger points.     Person(s) Educated  Patient    Methods  Explanation;Demonstration;Verbal cues;Handout    Comprehension  Returned demonstration;Verbalized understanding       PT Short Term Goals - 05/26/17 0844      PT SHORT TERM GOAL #1  Title  independent with initial HEP    Time  4    Period  Weeks    Status  Achieved      PT SHORT TERM GOAL #2   Title  understands ways to sleep uisng pillows to support cervical to decreased pain when she wakes up    Time  4    Period  Weeks    Status  New        PT Long Term Goals - 05/05/17 1258      PT LONG TERM GOAL #1   Title  independent with HEP    Time  8    Period  Weeks    Status  New    Target Date  06/30/17      PT LONG TERM GOAL #2   Title  when wake up with cervical pain decreased >/= 75%    Time  8    Period  Weeks    Status  New    Target Date  06/30/17      PT LONG TERM GOAL #3   Title  driving with cervical pain decreased >/= 75% due to improved cervical mobility    Time  8    Period  Weeks    Status  New    Target Date  06/30/17      PT LONG TERM GOAL #4   Title  understand correct posture with work to decrease strain on neck    Time  8    Period  Weeks    Status  New    Target Date  06/30/17      PT LONG TERM GOAL #5   Title  FOTO score </= 29% limitation    Time  8     Period  Weeks    Status  New    Target Date  06/30/17      Additional Long Term Goals   Additional Long Term Goals  Yes      PT LONG TERM GOAL #6   Title  walking with pain decreased >/= 75% due to increased cervical thoracic strength    Time  8    Period  Weeks    Status  New    Target Date  06/30/17            Plan - 05/26/17 0842    Clinical Impression Statement  Patient pain was a 3/10 instead of 8/10 . Patient reports more tightness than sharp pain.  Patient had twitches in the upper trap and cervical muscles.  Patient has been consistent with her HEP.  Patient will benefit from skilled therapy to improve cervical and thoracic mobility with reduction of pain and improvement of pain.     Rehab Potential  Excellent    Clinical Impairments Affecting Rehab Potential  None    PT Frequency  1x / week    PT Duration  8 weeks    PT Treatment/Interventions  Cryotherapy;Electrical Stimulation;Moist Heat;Traction;Ultrasound;Therapeutic exercise;Therapeutic activities;Neuromuscular re-education;Patient/family education;Passive range of motion;Manual techniques;Dry needling    PT Next Visit Plan   scapular stretches, cervical ROM, interscapular strength, using pillows to sleep    PT Home Exercise Plan  progress as needed    Recommended Other Services  MD signed initial note    Consulted and Agree with Plan of Care  Patient       Patient will benefit from skilled therapeutic intervention in order to improve the following deficits and impairments:  Pain, Increased fascial restricitons,  Decreased mobility, Increased muscle spasms, Decreased endurance, Decreased activity tolerance, Decreased range of motion, Decreased strength  Visit Diagnosis: Cervicalgia  Other muscle spasm  Muscle weakness (generalized)     Problem List Patient Active Problem List   Diagnosis Date Noted  . GERD (gastroesophageal reflux disease) 01/09/2017  . Medication management 01/31/2015  . Depression  02/08/2014  . Generalized anxiety disorder 02/08/2014  . Vitamin D deficiency     Earlie Counts, PT 05/26/17 8:45 AM   [facility] [address], [place], [place], [address] Phone: [phone]   Fax:  [phone]  Name: Angela Dixon MRN: [id number] Date of Birth: 05-07-1985

## 2017-05-26 NOTE — Patient Instructions (Signed)
   Place a massage ball or tennis ball under your upper back on both sides. Gently compress and relax on the balls up to 1 minute. Keep your breathing steady and deep as you relax your upper back tension.     Begin by placing a double tennis ball (peanut) in the middle of your back (shown in the bottom picture). Next, fully lay on top of the tennis ball, keeping your knees bent.   THe other pictures show ways in which you can increase/decrease pressure that you feel on the tennis balls.    You can use two tennis balls or lacrosse balls taped together to perform this.   Place ball right at the top of your neck, below your occiput. Move neck freely in multiple planes with moderate pressure. This is another form of massage.  Also extend and flex your neck to help mobilize the upper cervical joints in the neck.   Perform for at least 2 minutes.   [facility] [address], [place], [place] [address] Phone # [phone] Fax [phone]

## 2017-06-02 ENCOUNTER — Encounter: Payer: Self-pay | Admitting: Physical Therapy

## 2017-06-02 ENCOUNTER — Ambulatory Visit: Payer: BLUE CROSS/BLUE SHIELD | Admitting: Physical Therapy

## 2017-06-02 DIAGNOSIS — M6281 Muscle weakness (generalized): Secondary | ICD-10-CM | POA: Diagnosis not present

## 2017-06-02 DIAGNOSIS — M542 Cervicalgia: Secondary | ICD-10-CM | POA: Diagnosis not present

## 2017-06-02 DIAGNOSIS — M62838 Other muscle spasm: Secondary | ICD-10-CM

## 2017-06-02 NOTE — Patient Instructions (Signed)
  PNF Strengthening: Resisted   Standing with resistive band around each hand, bring right arm up and away, thumb back. Repeat _10___ times per set. Do _2___ sets per session. Do _1-2___ sessions per day.      Resisted Horizontal Abduction: Bilateral   Sit or stand, tubing in both hands, arms out in front. Keeping arms straight, pinch shoulder blades together and stretch arms out. Repeat _10___ times per set. Do 2____ sets per session. Do _1-2___ sessions per day.   Scapular Retraction: Elbow Flexion (Standing)   With elbows bent to 90, pinch shoulder blades together and rotate arms out, keeping elbows bent. Repeat _10___ times per set. Do _1___ sets per session. Do many____ sessions per day.    [facility] [address] [place], [place] [address] Phone # [phone] Fax [phone]

## 2017-06-02 NOTE — Therapy (Signed)
[facility] [address] [place], [place], [address] Phone: [phone]   Fax:  [phone]  Physical Therapy Treatment  Patient Details  Name: Angela Dixon MRN: [id number] Date of Birth: 02-21-1986 Referring Provider: Dr. Liane Comber   Encounter Date: 06/02/2017  PT End of Session - 06/02/17 0839    Visit Number  3    Date for PT Re-Evaluation  06/30/17    Authorization Type  BCBS    Authorization - Visit Number  3    Authorization - Number of Visits  60    Activity Tolerance  Patient tolerated treatment well    Behavior During Therapy  [facility] for tasks assessed/performed       Past Medical History:  Diagnosis Date  . Abnormal Pap smear   . Acid reflux   . Anemia   . Chlamydia 2006  . Fracture   . Hypercholesterolemia   . Vitamin D deficiency     Past Surgical History:  Procedure Laterality Date  . COLPOSCOPY  2006  . WISDOM TOOTH EXTRACTION      There were no vitals filed for this visit.  Subjective Assessment - 06/02/17 0806    Subjective  I was a little sore after last treatment. My pain is 75% better. Sleep is 75% better. No change in headaches.     Patient Stated Goals  improve pain and flexibility    Currently in Pain?  Yes    Pain Score  1     Pain Location  Neck    Pain Orientation  Left    Pain Descriptors / Indicators  Tightness    Pain Type  Acute pain    Pain Radiating Towards  no pain in left shoulder in the past few days    Pain Frequency  Intermittent    Aggravating Factors   sleep, turning head    Pain Relieving Factors  heat, flexerill    Multiple Pain Sites  No         [facility] PT Assessment - 06/02/17 0001      Strength   Right Shoulder Internal Rotation  4/5    Right Shoulder External Rotation  4/5    Left Shoulder Internal Rotation  4/5    Left Shoulder External Rotation  4/5                  [facility] Adult PT Treatment/Exercise - 06/02/17 0001      Neck  Exercises: Seated   Other Seated Exercise  stretches for cervical rotation and sidebending to elongate muscles      Neck Exercises: Supine   Other Supine Exercise  lay on foam roll- decompression to re-aligne the spine,, alteranate shoulder flexion 10x, bil. shoulder flexion 10x, horisontal abduction wiht red band with therapist guiding left shoulder, diagonals with red band with VC on technique      Manual Therapy   Manual Therapy  Soft tissue mobilization;Joint mobilization    Joint Mobilization  P-A joint mobilization grade 3 from T1-T5    Soft tissue mobilization  left upper trap, left levator scapulae, left interscapular       Trigger Point Dry Needling - 06/02/17 7425    Consent Given?  Yes    Muscles Treated Upper Body  Upper trapezius;Levator scapulae    Upper Trapezius Response  Twitch reponse elicited;Palpable increased muscle length    Levator Scapulae Response  Twitch response elicited;Palpable increased muscle length  PT Education - 06/02/17 [phone]    Education provided  Yes    Education Details  interscapular strength for posture    Person(s) Educated  Patient    Methods  Explanation;Demonstration;Verbal cues;Handout    Comprehension  Returned demonstration;Verbalized understanding       PT Short Term Goals - 06/02/17 0810      PT SHORT TERM GOAL #2   Title  understands ways to sleep uisng pillows to support cervical to decreased pain when she wakes up    Time  4    Period  Weeks    Status  Achieved        PT Long Term Goals - 06/02/17 0810      PT LONG TERM GOAL #1   Title  independent with HEP    Baseline  still learning    Time  8    Period  Weeks    Status  On-going      PT LONG TERM GOAL #2   Title  when wake up with cervical pain decreased >/= 75%    Baseline  75% better    Time  8    Period  Weeks    Status  On-going      PT LONG TERM GOAL #3   Title  driving with cervical pain decreased >/= 75% due to improved cervical  mobility    Baseline  50% better    Time  8    Period  Weeks    Status  On-going      PT LONG TERM GOAL #4   Title  understand correct posture with work to decrease strain on neck    Time  8    Period  Weeks    Status  On-going      PT LONG TERM GOAL #6   Title  walking with pain decreased >/= 75% due to increased cervical thoracic strength    Baseline  50% better    Time  8    Period  Weeks    Status  On-going            Plan - 06/02/17 1601    Clinical Impression Statement  Patient is 75% better.  Patient has no change in headaches.  Patient has tightness in the levator scapulae and upper trap on the left.  Patient needed guidance of the left scapula an drelaxation of the upper trap with interscapular strength.  Patient will benefit from skilled therapy to reduce pain and improve function.     Rehab Potential  Excellent    Clinical Impairments Affecting Rehab Potential  None    PT Frequency  1x / week    PT Duration  8 weeks    PT Treatment/Interventions  Cryotherapy;Electrical Stimulation;Moist Heat;Traction;Ultrasound;Therapeutic exercise;Therapeutic activities;Neuromuscular re-education;Patient/family education;Passive range of motion;Manual techniques;Dry needling    PT Next Visit Plan   scapular stretches, cervical ROM, , using pillows to sleep    PT Home Exercise Plan  progress as needed    Consulted and Agree with Plan of Care  Patient       Patient will benefit from skilled therapeutic intervention in order to improve the following deficits and impairments:  Pain, Increased fascial restricitons, Decreased mobility, Increased muscle spasms, Decreased endurance, Decreased activity tolerance, Decreased range of motion, Decreased strength  Visit Diagnosis: Cervicalgia  Other muscle spasm  Muscle weakness (generalized)     Problem List Patient Active Problem List   Diagnosis Date Noted  . GERD (gastroesophageal reflux  disease) 01/09/2017  . Medication  management 01/31/2015  . Depression 02/08/2014  . Generalized anxiety disorder 02/08/2014  . Vitamin D deficiency     Earlie Counts, PT 06/02/17 8:42 AM   [facility] [address], [place], [place], [address] Phone: [phone]   Fax:  [phone]  Name: Angela Dixon MRN: [id number] Date of Birth: 1985-04-20

## 2017-06-09 ENCOUNTER — Ambulatory Visit: Payer: BLUE CROSS/BLUE SHIELD | Attending: Adult Health | Admitting: Physical Therapy

## 2017-06-09 ENCOUNTER — Encounter: Payer: Self-pay | Admitting: Physical Therapy

## 2017-06-09 DIAGNOSIS — M62838 Other muscle spasm: Secondary | ICD-10-CM | POA: Diagnosis not present

## 2017-06-09 DIAGNOSIS — M6281 Muscle weakness (generalized): Secondary | ICD-10-CM | POA: Diagnosis not present

## 2017-06-09 DIAGNOSIS — M542 Cervicalgia: Secondary | ICD-10-CM | POA: Diagnosis not present

## 2017-06-09 NOTE — Patient Instructions (Addendum)
Computer Work    Dance movement psychotherapist" on edge of chair or use a saddle-type seat. Keep natural arch in lower back. Adjust height of table and stool so that you can look straight ahead or slightly down at monitor. Use back support only as necessary. Consider use of computer glasses as needed.  Copyright  VHI. All rights reserved.  Eating    Sit, protecting natural arch in low back. Bring food to mouth, not mouth to food. Do not lean on elbows or arms.  Copyright  VHI. All rights reserved.  Housework: Vacuuming    DO: Keep vacuum cleaner close to body. Lean into task with whole body. Minimize bending and rotation of the back. Bend knees to avoid back strain. Vacuum with short motions. Keep one foot ahead of the other during back and forth motions. Walk over to corners and hard-to-reach areas. DON'T: Reach with vacuum into corners and hard-to-reach areas.  Copyright  VHI. All rights reserved.  Talking on the Telephone    DO: Use a phone support on shoulder or hold telephone with hand or use headset. DON'T: Hunch shoulder and tilt head to hold phone. DON'T: Multi-task with phone held between shoulder and ear.  Copyright  VHI. All rights reserved.  Avoid Twisting    Avoid twisting or bending back. Pivot around using foot movements, and bend at knees if needed when reaching for articles.   Copyright  VHI. All rights reserved.  Childcare - Carrying    Keep baby close and as upright as possible.   Copyright  VHI. All rights reserved.  [facility] [address] [place], [place] [address] Phone # [phone] Fax [phone]

## 2017-06-09 NOTE — Therapy (Signed)
[facility] [address] [place], [place], [address] Phone: [phone]   Fax:  [phone]  Physical Therapy Treatment  Patient Details  Name: Angela Dixon MRN: [id number] Date of Birth: 23-Jul-1985 Referring Provider: Dr. Liane Comber   Encounter Date: 06/09/2017  PT End of Session - 06/09/17 0812    Visit Number  4    Date for PT Re-Evaluation  06/30/17    Authorization Type  BC[employer]    Authorization - Visit Number  4    Authorization - Number of Visits  31    PT Start Time  0803    PT Stop Time  0830 left early due to done with therapy    PT Time Calculation (min)  27 min    Activity Tolerance  Patient tolerated treatment well    Behavior During Therapy  [facility] for tasks assessed/performed       Past Medical History:  Diagnosis Date  . Abnormal Pap smear   . Acid reflux   . Anemia   . Chlamydia 2006  . Fracture   . Hypercholesterolemia   . Vitamin D deficiency     Past Surgical History:  Procedure Laterality Date  . COLPOSCOPY  2006  . WISDOM TOOTH EXTRACTION      There were no vitals filed for this visit.  Subjective Assessment - 06/09/17 0806    Subjective  I felt alot better.  I am 95% better.  I only had one headache this week.     Patient Stated Goals  improve pain and flexibility    Currently in Pain?  No/denies         [facility] PT Assessment - 06/09/17 0001      Assessment   Medical Diagnosis  M54.2 Neck pain; M62.838 Muscle spasms of neck    Referring Provider  Dr. Liane Comber    Onset Date/Surgical Date  03/05/17    Hand Dominance  Right    Prior Therapy  none      Precautions   Precautions  None      Restrictions   Weight Bearing Restrictions  No      Home Environment   Living Environment  Private residence      Prior Function   Level of Independence  Independent    Vocation  Full time employment    Vocation Requirements  sit at a computer    Leisure  walk 3 times per  week      Cognition   Overall Cognitive Status  Within Functional Limits for tasks assessed      Observation/Other Assessments   Focus on Therapeutic Outcomes (FOTO)   4% limitation       AROM   AROM Assessment Site  Cervical    Cervical Flexion  30    Cervical Extension  50    Cervical - Right Side Bend  50    Cervical - Left Side Bend  50    Cervical - Right Rotation  75    Cervical - Left Rotation  80      Strength   Right Shoulder Internal Rotation  5/5    Right Shoulder External Rotation  5/5    Left Shoulder Internal Rotation  5/5    Left Shoulder External Rotation  5/5      Transfers   Transfers  Not assessed      Ambulation/Gait   Ambulation/Gait  No  [facility] PT Treatment/Exercise - 06/09/17 0001      Therapeutic Activites    Therapeutic Activities  ADL's    ADL's  sleep with pillow and towel roll to decrease strain on the cervical      Neck Exercises: Supine   Other Supine Exercise  lay on foam roll- decompression to re-aligne the spine,, alteranate shoulder flexion 10x, bil. shoulder flexion 10x, horisontal abduction wiht red band with therapist guiding left shoulder, diagonals with red band with VC on technique      Manual Therapy   Manual Therapy  Soft tissue mobilization    Soft tissue mobilization  bilateral cervical paraspinals and upper trap and subocciptials             PT Education - 06/09/17 0817    Education Details  body mechanics with daily tasks    Methods  Explanation;Demonstration;Verbal cues;Handout    Comprehension  Returned demonstration;Verbalized understanding       PT Short Term Goals - 06/02/17 0810      PT SHORT TERM GOAL #2   Title  understands ways to sleep uisng pillows to support cervical to decreased pain when she wakes up    Time  4    Period  Weeks    Status  Achieved        PT Long Term Goals - 06/09/17 2876      PT LONG TERM GOAL #1   Title  independent with HEP    Time  8     Period  Weeks    Status  Achieved      PT LONG TERM GOAL #2   Title  when wake up with cervical pain decreased >/= 75%    Time  8    Period  Weeks    Status  Achieved      PT LONG TERM GOAL #3   Title  driving with cervical pain decreased >/= 75% due to improved cervical mobility    Time  8    Period  Weeks    Status  Achieved      PT LONG TERM GOAL #4   Title  understand correct posture with work to decrease strain on neck    Time  8    Period  Weeks    Status  Achieved      PT LONG TERM GOAL #5   Title  FOTO score </= 29% limitation    Time  8    Status  Achieved      PT LONG TERM GOAL #6   Title  walking with pain decreased >/= 75% due to increased cervical thoracic strength    Time  8    Period  Weeks    Status  Achieved            Plan - 06/09/17 0817    Clinical Impression Statement  Patient reports she is 95% better.  Patient has full cervical ROM and strength of bilateral shoulders.  Patient has full cervical vertebrae mobility.  Patient understands correct body mechanics to decrease strain on cervical.  Patient is having no pain with sleeping.  Patient is independent with her HEP.  Patient is ready for discharge.     Rehab Potential  Excellent    Clinical Impairments Affecting Rehab Potential  None    PT Treatment/Interventions  Cryotherapy;Electrical Stimulation;Moist Heat;Traction;Ultrasound;Therapeutic exercise;Therapeutic activities;Neuromuscular re-education;Patient/family education;Passive range of motion;Manual techniques;Dry needling    PT Next Visit Plan  Discharge to HEP this visit  PT Home Exercise Plan  Current HEP    Consulted and Agree with Plan of Care  Patient       Patient will benefit from skilled therapeutic intervention in order to improve the following deficits and impairments:  Pain, Increased fascial restricitons, Decreased mobility, Increased muscle spasms, Decreased endurance, Decreased activity tolerance, Decreased range of  motion, Decreased strength  Visit Diagnosis: Cervicalgia  Other muscle spasm  Muscle weakness (generalized)     Problem List Patient Active Problem List   Diagnosis Date Noted  . GERD (gastroesophageal reflux disease) 01/09/2017  . Medication management 01/31/2015  . Depression 02/08/2014  . Generalized anxiety disorder 02/08/2014  . Vitamin D deficiency     Earlie Counts, PT 06/09/17 8:37 AM    [facility] [address] [place], [place], [address] Phone: [phone]   Fax:  [phone]  Name: Angela Dixon MRN: [id number] Date of Birth: 10-Jun-1985  PHYSICAL THERAPY DISCHARGE SUMMARY  Visits from Start of Care: 4  Current functional level related to goals / functional outcomes: See above.    Remaining deficits: See above.    Education / Equipment: HEP Plan: Patient agrees to discharge.  Patient goals were met. Patient is being discharged due to meeting the stated rehab goals.  Thank you for the referral. Earlie Counts, PT 06/09/17 8:37 AM  ?????

## 2017-06-17 ENCOUNTER — Encounter: Payer: Self-pay | Admitting: Physical Therapy

## 2017-06-17 DIAGNOSIS — Z713 Dietary counseling and surveillance: Secondary | ICD-10-CM | POA: Diagnosis not present

## 2017-07-07 ENCOUNTER — Other Ambulatory Visit: Payer: Self-pay | Admitting: Physician Assistant

## 2017-08-11 NOTE — Progress Notes (Signed)
Assessment and Plan:  Diarrhea/ left flank pain/ lower abdominal discomfort -     CBC with Differential/Platelet -     COMPLETE METABOLIC PANEL WITH GFR -     Hepatic function panel -     Urinalysis w microscopic + reflex cultur -     Amylase -     Lipase       -     Urinalysis w microscopic + reflex cultur  Suggested imodium, soluble fiber supplement taken regularly for a while, consider switching probiotic to "Align" OTC product Advised to avoid common trigger foods - cut down on caffeine, fatty foods, spicy foods, artificial sweeteners Try switching from prilosec back to ranitidine Food diary advised - information for FODMAP given If not improving in 2 weeks will have come back for stool specimen to check for parasites   Further disposition pending results of labs. Discussed med's effects and SE's.   Over 30 minutes of exam, counseling, chart review, and critical decision making was performed.   Future Appointments  Date Time Provider [place]  01/19/2018 10:00 AM [name], PA-C [facility] None    ------------------------------------------------------------------------------------------------------------------   HPI BP 102/64   Pulse 70   Temp (!) 97.3 F (36.3 C)   Ht 5\' 5"  (1.651 m)   Wt 213 lb (96.6 kg)   SpO2 99%   BMI 35.45 kg/m   32 y.o.female with hx of GERD, depression/anxiety presents for evaluation for intermittent diarrhea, she reports alternating for the last 3 weeks or so between watery diarrhea and small, very hard "pellets" - but normal color/odor. Not fatty, with mucus or blood. She does endorse a vague lower abdominal discomfort prior to BM but no notable abdominal pain. Denies n/v, rashes, fever/chills, malaise, fatigue, HA, dizziness, blurry vision. Reports good appetite, denies early satiety or weight loss. She has not traveled recently and has not been prescribed new medications or antibiotics. She does endorse increased stress recently.    She has been taking imodium OTC intermittently which has improved the symptoms somewhat but hasn't resolved - while on imodium, episodes have been ~4 per day. She reports has intermittently taken soluable fiber supplement. She is currently on a probiotic. She does drink a lot of caffeine - coffee, tea, energy drinks, but not more than usual. No notable association of diarrhea with foods.   She also endorses vague left mid back/flank achiness; reports has recently had left sided neck pain for which she was going to PT. Has been doing stretching exercises and feels this has been ? Helpful.   She does have hx of reflux - was previously on ranitidine, but has switched to prilosec.   She has no hx of thyroid disease; most recently TSH was checked 01/09/2017 at 1.38. Denies heat intolerance, hair loss, palpitations, anxiety, insomnia.   No hx of abdominal surgery.   Past Medical History:  Diagnosis Date  . Abnormal Pap smear   . Acid reflux   . Anemia   . Chlamydia 2006  . Fracture   . Hypercholesterolemia   . Vitamin D deficiency      Allergies  Allergen Reactions  . Neomycin Rash    Current Outpatient Medications on File Prior to Visit  Medication Sig  . albuterol (PROVENTIL HFA;VENTOLIN HFA) 108 (90 Base) MCG/ACT inhaler Inhale 2 puffs into the lungs every 4 hours as needed for wheezing or shortness of breath.  Marland Kitchen azelastine (ASTELIN) 0.1 % nasal spray Place 2 sprays into both nostrils 2 (two) times  daily. Use in each nostril as directed  . cetirizine (ZYRTEC) 10 MG tablet Take 10 mg by mouth daily.  . Cholecalciferol 4000 UNITS CAPS Take 1 capsule by mouth daily.  . Flaxseed, Linseed, (FLAX SEED OIL) 1000 MG CAPS Take by mouth daily.  . Magnesium 500 MG CAPS Take by mouth daily.  . Multiple Vitamin (MULTIVITAMIN WITH MINERALS) TABS tablet Take 1 tablet by mouth daily.  Marland Kitchen omeprazole (PRILOSEC) 20 MG capsule TAKE 1 CAPSULE BY MOUTH EVERY DAY  . Probiotic Product (PROBIOTIC DAILY PO)  Take by mouth.  . cyclobenzaprine (FLEXERIL) 5 MG tablet Take 1 tablet (5 mg total) by mouth 3 (three) times daily as needed for muscle spasms. (Patient not taking: Reported on 08/12/2017)  . predniSONE (DELTASONE) 20 MG tablet 2 tablets daily for 3 days, 1 tablet daily for 4 days. (Patient not taking: Reported on 08/12/2017)  . ranitidine (ZANTAC) 300 MG tablet TAKE 1 TABLET (300 MG TOTAL) BY MOUTH 2 (TWO) TIMES DAILY. (Patient not taking: Reported on 08/12/2017)   No current facility-administered medications on file prior to visit.     ROS: Review of Systems  Eyes: Negative for blurred vision.  Respiratory: Negative for cough, sputum production, shortness of breath and wheezing.   Cardiovascular: Negative for chest pain, palpitations, orthopnea, claudication, leg swelling and PND.  Gastrointestinal: Positive for abdominal pain and diarrhea. Negative for blood in stool, constipation, heartburn, melena, nausea and vomiting.  Genitourinary: Negative.   Musculoskeletal: Positive for back pain (Vague, mild, left mid back/flank).  Neurological: Negative for dizziness and headaches.  Endo/Heme/Allergies: Negative for polydipsia.  Psychiatric/Behavioral: Negative for depression. The patient is not nervous/anxious and does not have insomnia.      Physical Exam:  BP 102/64   Pulse 70   Temp (!) 97.3 F (36.3 C)   Ht 5\' 5"  (1.651 m)   Wt 213 lb (96.6 kg)   SpO2 99%   BMI 35.45 kg/m   General Appearance: Well nourished, in no apparent distress. Eyes: PERRLA, EOMs, conjunctiva no swelling or erythema Sinuses: No Frontal/maxillary tenderness ENT/Mouth: Ext aud canals clear, TMs without erythema, bulging. No erythema, swelling, or exudate on post pharynx.  Tonsils not swollen or erythematous. Hearing normal.  Neck: Supple, thyroid normal.  Respiratory: Respiratory effort normal, BS equal bilaterally without rales, rhonchi, wheezing or stridor.  Cardio: RRR with no MRGs. Brisk peripheral pulses  without edema.  Abdomen: Soft, + BS.  Vaguely tender to lower abdomen, no guarding, rebound, hernias, masses. Lymphatics: Non tender without lymphadenopathy.  Musculoskeletal: Full ROM, symmetrical strength, normal gait.  Skin: Warm, dry without rashes, lesions, ecchymosis.  Neuro: Cranial nerves intact. Normal muscle tone, no cerebellar symptoms. Sensation intact.  Psych: Awake and oriented X 3, normal affect, Insight and Judgment appropriate.     [name], NP 12:27 PM [facility]

## 2017-08-12 ENCOUNTER — Encounter (INDEPENDENT_AMBULATORY_CARE_PROVIDER_SITE_OTHER): Payer: Self-pay

## 2017-08-12 ENCOUNTER — Ambulatory Visit: Payer: BLUE CROSS/BLUE SHIELD | Admitting: Adult Health

## 2017-08-12 ENCOUNTER — Encounter: Payer: Self-pay | Admitting: Adult Health

## 2017-08-12 VITALS — BP 102/64 | HR 70 | Temp 97.3°F | Ht 65.0 in | Wt 213.0 lb

## 2017-08-12 DIAGNOSIS — R197 Diarrhea, unspecified: Secondary | ICD-10-CM

## 2017-08-12 DIAGNOSIS — R1030 Lower abdominal pain, unspecified: Secondary | ICD-10-CM | POA: Diagnosis not present

## 2017-08-12 DIAGNOSIS — R748 Abnormal levels of other serum enzymes: Secondary | ICD-10-CM | POA: Diagnosis not present

## 2017-08-12 NOTE — Patient Instructions (Addendum)
Prilosec can rarely cause diarrhea - consider switching back to ranitidine 300 mg twice a day   Try align over the counter probiotic - lots of good results for lower abdominal symptoms with this  Continue with imodium  Do citrucel/benefiber regularly for a while  Avoid aggravating foods - common causes are artificial sweeteners, caffeine, spicy foods, fatty foods  Consider keeping a food diary while considering the FODMAP food information attached below - if fatty foods are problematic, we might look more closely at your gallbladder  If not improving in another 2 weeks, we will have you come back just for a stool pathogen panel to rule out parasites, etc  Chronic Diarrhea Diarrhea is a condition in which a person passes frequent loose and watery stools. It can cause you to feel weak and dehydrated. Dehydration can make you tired and thirsty. It can also cause a dry mouth, decreased urination, and dark yellow urine. Diarrhea is a sign of another underlying problem, such as:  Infection.  Medication side effects.  Dietary intolerance, such as lactose intolerance.  Conditions such as celiac disease, irritable bowel syndrome (IBS), or inflammatory bowel disease (IBD).  In most cases, diarrhea lasts 2-3 days. Diarrhea that lasts longer than 4 weeks is called long-lasting (chronic) diarrhea. It is important that you treat your diarrhea as told by your health care provider. Follow these instructions at home: Follow these recommendations as told by your health care provider. Eating and drinking  Take an oral rehydration solution (ORS). This is a drink that is designed to keep you hydrated. It can be found at pharmacies and retail stores.  Drink clear fluids, such as water, ice chips, diluted fruit juice, and low-calorie sports drinks.  Follow the diet recommended by your health care provider. You may need to avoid foods that trigger diarrhea for you.  Avoid foods and beverages that contain  a lot of sugar or caffeine.  Avoid alcohol.  Avoid spicy or fatty foods. General instructions  Drink enough fluid to keep your urine clear or pale yellow.  Wash your hands often and after each diarrhea episode. If soap and water are not available, use hand sanitizer.  Make sure that all people in your household wash their hands well and often.  Take over-the-counter and prescription medicines only as told by your health care provider.  If you were prescribed an antibiotic medicine, take it as told by your health care provider. Do not stop taking the antibiotic even if you start to feel better.  Rest at home while you recover.  Watch your condition for any changes.  Take a warm bath to relieve any burning or pain from frequent diarrhea episodes.  Keep all follow-up visits as told by your health care provider. This is important. Contact a health care provider if:  You have a fever.  Your diarrhea gets worse or does not get better.  You have new symptoms.  You cannot drink fluids without vomiting.  You feel light-headed or dizzy.  You have a headache.  You have muscle cramps.  You have severe pain in the rectum. Get help right away if:  You have persistent vomiting.  You have chest pain.  You feel extremely weak or you faint.  You have bloody or black stools, or stools that look like tar.  You have severe pain, cramping, or bloating in your abdomen, or pain that stays in one place.  You have trouble breathing or you are breathing very quickly.  Your heart  is beating very quickly.  Your skin feels cold and clammy.  You feel confused.  You have a severe headache.  You have signs of dehydration, such as: ? Dark urine, very little urine, or no urine. ? Cracked lips. ? Dry mouth. ? Sunken eyes. ? Sleepiness. ? Weakness. Summary  Chronic diarrhea is a condition in which a person passes frequent loose and watery stools for more than 4 weeks.  Diarrhea  is a sign of another underlying problem.  Drink enough fluid to keep your urine clear or pale yellow to avoid dehydration.  Wash your hands often and after each diarrhea episode. If soap and water are not available, use hand sanitizer.  It is important that you treat your diarrhea as told by your health care provider. This information is not intended to replace advice given to you by your health care provider. Make sure you discuss any questions you have with your health care provider. Document Released: 06/15/2003 Document Revised: 02/12/2016 Document Reviewed: 02/12/2016 Elsevier Interactive Patient Education  2017 [employer].     If diarrhea is severe - 4+ watery diarrhea episodes, take imodium to reduce fluid loss, and start on electrolyte supplemented fluids.   Please go to the ER if you have any severe AB pain, unable to hold down food/water, blood in stool or vomit, chest pain, shortness of breath, or any worsening symptoms.   Consider keeping a food diary- common causes of diarrhea are dairy, certain carbs...  FODMAP stands for fermentable oligo-, di-, mono-saccharides and polyols (1). These are the scientific terms used to classify groups of carbs that are notorious for triggering digestive symptoms like bloating, gas and stomach pain.   FODMAPs are found in a wide range of foods in varying amounts. Some foods contain just one type, while others contain several.  The main dietary sources of the four groups of FODMAPs include:  Oligosaccharides: Wheat, rye, legumes and various fruits and vegetables, such as garlic and onions.  Disaccharides: Milk, yogurt and soft cheese. Lactose is the main carb.  Monosaccharides: Various fruit including figs and mangoes, and sweeteners such as honey and agave nectar. Fructose is the main carb.  Polyols: Certain fruits and vegetables including blackberries and lychee, as well as some low-calorie sweeteners like those in sugar-free  gum.   Keep a food diary. This will help you identify foods that cause symptoms. Write down: ? What you eat and when. ? What symptoms you have. ? When symptoms occur in relation to your meals.  Avoid foods that cause symptoms. Talk with your dietitian about other ways to get the same nutrients that are in these foods.  Eat your meals slowly, in a relaxed setting.  Aim to eat 5-6 small meals per day. Do not skip meals.  Drink enough fluids to keep your urine clear or pale yellow.  Ask your health care provider if you should take an over-the-counter probiotic during flare-ups to help restore healthy gut bacteria.  If you have cramping or diarrhea, try making your meals low in fat and high in carbohydrates. Examples of carbohydrates are pasta, rice, whole grain breads and cereals, fruits, and vegetables.  If dairy products cause your symptoms to flare up, try eating less of them. You might be able to handle yogurt better than other dairy products because it contains bacteria that help with digestion.

## 2017-08-13 LAB — COMPLETE METABOLIC PANEL WITH GFR
AG Ratio: 1.8 (calc) (ref 1.0–2.5)
ALBUMIN MSPROF: 4.5 g/dL (ref 3.6–5.1)
ALT: 14 U/L (ref 6–29)
AST: 18 U/L (ref 10–30)
Alkaline phosphatase (APISO): 55 U/L (ref 33–115)
BUN: 12 mg/dL (ref 7–25)
CALCIUM: 9.5 mg/dL (ref 8.6–10.2)
CO2: 27 mmol/L (ref 20–32)
CREATININE: 0.67 mg/dL (ref 0.50–1.10)
Chloride: 105 mmol/L (ref 98–110)
GFR, EST NON AFRICAN AMERICAN: 116 mL/min/{1.73_m2} (ref >=60)
GFR, Est African American: 135 mL/min/{1.73_m2} (ref >=60)
GLOBULIN: 2.5 g/dL (ref 1.9–3.7)
GLUCOSE: 73 mg/dL (ref 65–99)
Potassium: 4.4 mmol/L (ref 3.5–5.3)
SODIUM: 139 mmol/L (ref 135–146)
TOTAL PROTEIN: 7 g/dL (ref 6.1–8.1)
Total Bilirubin: 0.5 mg/dL (ref 0.2–1.2)

## 2017-08-13 LAB — CBC WITH DIFFERENTIAL/PLATELET
BASOS ABS: 62 {cells}/uL (ref 0–200)
Basophils Relative: 0.6 %
EOS ABS: 309 {cells}/uL (ref 15–500)
Eosinophils Relative: 3 %
HEMATOCRIT: 39.4 % (ref 35.0–45.0)
HEMOGLOBIN: 13.5 g/dL (ref 11.7–15.5)
LYMPHS ABS: 2585 {cells}/uL (ref 850–3900)
MCH: 30 pg (ref 27.0–33.0)
MCHC: 34.3 g/dL (ref 32.0–36.0)
MCV: 87.6 fL (ref 80.0–100.0)
MPV: 10.6 fL (ref 7.5–12.5)
Monocytes Relative: 8.9 %
NEUTROS ABS: 6427 {cells}/uL (ref 1500–7800)
Neutrophils Relative %: 62.4 %
Platelets: 303 10*3/uL (ref 140–400)
RBC: 4.5 10*6/uL (ref 3.80–5.10)
RDW: 13 % (ref 11.0–15.0)
Total Lymphocyte: 25.1 %
WBC: 10.3 10*3/uL (ref 3.8–10.8)
WBCMIX: 917 {cells}/uL (ref 200–950)

## 2017-08-13 LAB — URINALYSIS W MICROSCOPIC + REFLEX CULTURE
BACTERIA UA: NONE SEEN /HPF
BILIRUBIN URINE: NEGATIVE
GLUCOSE, UA: NEGATIVE
HGB URINE DIPSTICK: NEGATIVE
Hyaline Cast: NONE SEEN /LPF
Ketones, ur: NEGATIVE
Leukocyte Esterase: NEGATIVE
NITRITES URINE, INITIAL: NEGATIVE
Protein, ur: NEGATIVE
RBC / HPF: NONE SEEN /HPF (ref 0–2)
SPECIFIC GRAVITY, URINE: 1.021 (ref 1.001–1.03)
SQUAMOUS EPITHELIAL / LPF: NONE SEEN /HPF (ref <=5)
WBC, UA: NONE SEEN /HPF (ref 0–5)
pH: 6 (ref 5.0–8.0)

## 2017-08-13 LAB — NO CULTURE INDICATED

## 2017-08-13 LAB — HEPATIC FUNCTION PANEL
AG RATIO: 1.8 (calc) (ref 1.0–2.5)
ALKALINE PHOSPHATASE (APISO): 55 U/L (ref 33–115)
ALT: 14 U/L (ref 6–29)
AST: 18 U/L (ref 10–30)
Albumin: 4.5 g/dL (ref 3.6–5.1)
BILIRUBIN TOTAL: 0.5 mg/dL (ref 0.2–1.2)
Bilirubin, Direct: 0.1 mg/dL (ref 0.0–0.2)
GLOBULIN: 2.5 g/dL (ref 1.9–3.7)
Indirect Bilirubin: 0.4 mg/dL (calc) (ref 0.2–1.2)
TOTAL PROTEIN: 7 g/dL (ref 6.1–8.1)

## 2017-08-13 LAB — AMYLASE: Amylase: 21 U/L (ref 21–101)

## 2017-08-13 LAB — LIPASE: Lipase: 11 U/L (ref 7–60)

## 2017-08-26 DIAGNOSIS — Z713 Dietary counseling and surveillance: Secondary | ICD-10-CM | POA: Diagnosis not present

## 2017-09-19 ENCOUNTER — Encounter: Payer: Self-pay | Admitting: Adult Health

## 2017-09-19 ENCOUNTER — Other Ambulatory Visit: Payer: Self-pay | Admitting: Adult Health

## 2017-09-19 DIAGNOSIS — M542 Cervicalgia: Secondary | ICD-10-CM

## 2017-09-19 MED ORDER — PREDNISONE 20 MG PO TABS: ORAL_TABLET | ORAL | 0 refills | Status: DC

## 2017-09-19 MED ORDER — METHOCARBAMOL 750 MG PO TABS
750.0000 mg | ORAL_TABLET | Freq: Three times a day (TID) | ORAL | 1 refills | Status: DC | PRN
Start: 2017-09-19 — End: 2019-05-05

## 2017-10-05 DIAGNOSIS — Z6835 Body mass index (BMI) 35.0-35.9, adult: Secondary | ICD-10-CM | POA: Diagnosis not present

## 2017-10-05 DIAGNOSIS — K529 Noninfective gastroenteritis and colitis, unspecified: Secondary | ICD-10-CM | POA: Diagnosis not present

## 2017-10-30 ENCOUNTER — Encounter: Payer: Self-pay | Admitting: Adult Health

## 2017-10-30 ENCOUNTER — Other Ambulatory Visit: Payer: Self-pay | Admitting: Adult Health

## 2017-10-30 MED ORDER — RANITIDINE HCL 300 MG PO TABS: ORAL_TABLET | ORAL | 1 refills | Status: DC

## 2017-11-03 ENCOUNTER — Encounter: Payer: Self-pay | Admitting: Physician Assistant

## 2017-12-09 ENCOUNTER — Encounter: Payer: Self-pay | Admitting: Adult Health

## 2017-12-09 ENCOUNTER — Ambulatory Visit (INDEPENDENT_AMBULATORY_CARE_PROVIDER_SITE_OTHER): Payer: BLUE CROSS/BLUE SHIELD | Admitting: Adult Health

## 2017-12-09 VITALS — BP 110/78 | HR 95 | Temp 96.8°F | Ht 65.0 in | Wt 204.0 lb

## 2017-12-09 DIAGNOSIS — S90511A Abrasion, right ankle, initial encounter: Secondary | ICD-10-CM | POA: Diagnosis not present

## 2017-12-09 MED ORDER — CEPHALEXIN 500 MG PO CAPS: ORAL_CAPSULE | ORAL | 0 refills | Status: DC

## 2017-12-09 NOTE — Patient Instructions (Signed)

## 2017-12-09 NOTE — Progress Notes (Signed)
Assessment and Plan:  Abrasion of right ankle, initial encounter Reassured patient, appears as mild superficial wound, no need for oral antibiotics unless getting worse not better. Printed keflex script to take should redness increase, area become warm to touch.  Discussed general wound care principles, may continue with topical antibiotic.  Call office with any questions or concerns.  -     cephALEXin (KEFLEX) 500 MG capsule; Take 1 capsule 4 x/day after meals & bedtime for infection  Further disposition pending results of labs. Discussed med's effects and SE's.   Over 15 minutes of exam, counseling, chart review, and critical decision making was performed.   Future Appointments  Date Time Provider [place]  01/19/2018 10:00 AM [name], PA-C [facility] None    ------------------------------------------------------------------------------------------------------------------   HPI BP 110/78   Pulse 95   Temp (!) 96.8 F (36 C)   Ht 5\' 5"  (1.651 m)   Wt 204 lb (92.5 kg)   SpO2 99%   BMI 33.95 kg/m   32 y.o.female presents for evaluation of right ankle wound sustained 3 days ago on 12/06/2017 after she rubbed the skin going down a slide. She presents with superficial scabbed wound ~1 cm with some surrounding erythema ~2 cm  over right lateral malleolus. She reports wound was quite inflamed and red yesterday but seems to be improving (she has drawn border lines in marker). She has been applying topical OTC antibiotic. Denies fever/chills, significant pain, discharge. No problems bearing weight or with ROM.   Past Medical History:  Diagnosis Date  . Abnormal Pap smear   . Acid reflux   . Anemia   . Chlamydia 2006  . Fracture   . Hypercholesterolemia   . Vitamin D deficiency      Allergies  Allergen Reactions  . Neomycin Rash    Current Outpatient Medications on File Prior to Visit  Medication Sig  . albuterol (PROVENTIL HFA;VENTOLIN HFA) 108 (90 Base)  MCG/ACT inhaler Inhale 2 puffs into the lungs every 4 hours as needed for wheezing or shortness of breath.  Marland Kitchen azelastine (ASTELIN) 0.1 % nasal spray Place 2 sprays into both nostrils 2 (two) times daily. Use in each nostril as directed  . cetirizine (ZYRTEC) 10 MG tablet Take 10 mg by mouth daily.  . Cholecalciferol 4000 UNITS CAPS Take 1 capsule by mouth daily.  . Flaxseed, Linseed, (FLAX SEED OIL) 1000 MG CAPS Take by mouth daily.  . Magnesium 500 MG CAPS Take by mouth daily.  . methocarbamol (ROBAXIN-750) 750 MG tablet Take 1 tablet (750 mg total) by mouth every 8 (eight) hours as needed for muscle spasms.  . Multiple Vitamin (MULTIVITAMIN WITH MINERALS) TABS tablet Take 1 tablet by mouth daily.  Marland Kitchen omeprazole (PRILOSEC) 20 MG capsule TAKE 1 CAPSULE BY MOUTH EVERY DAY  . predniSONE (DELTASONE) 20 MG tablet 2 tablets daily for 3 days, 1 tablet daily for 4 days.  . Probiotic Product (PROBIOTIC DAILY PO) Take by mouth.  . ranitidine (ZANTAC) 300 MG tablet Take 1/2-1 tab twice daily for acid reflux.   No current facility-administered medications on file prior to visit.     ROS: all negative except above.   Physical Exam:  BP 110/78   Pulse 95   Temp (!) 96.8 F (36 C)   Ht 5\' 5"  (1.651 m)   Wt 204 lb (92.5 kg)   SpO2 99%   BMI 33.95 kg/m   General Appearance: Well nourished, in no apparent distress. Eyes: conjunctiva no swelling or  erythema ENT/Mouth: Hearing normal.  Neck: Supple  Respiratory: Respiratory effort normal, BS equal bilaterally without rales, rhonchi, wheezing or stridor.  Cardio: RRR with no MRGs. Brisk peripheral pulses without edema.  Lymphatics: Non tender without lymphadenopathy.  Musculoskeletal: Full ROM, 5/5 strength, normal gait.  Skin: Warm, dry without rashes, ecchymosis; superficial wound over right lateral malleollus, approx 1 cm with scab; ~2 cm area of mild surrounding erythema without significant heat, edema. No discharge present.   Psych: Awake and  oriented X 3, normal affect, Insight and Judgment appropriate.    [name], NP 11:54 AM [facility]

## 2017-12-23 ENCOUNTER — Other Ambulatory Visit: Payer: Self-pay | Admitting: Physician Assistant

## 2017-12-29 ENCOUNTER — Encounter (HOSPITAL_BASED_OUTPATIENT_CLINIC_OR_DEPARTMENT_OTHER): Payer: Self-pay | Admitting: *Deleted

## 2017-12-29 ENCOUNTER — Emergency Department (HOSPITAL_BASED_OUTPATIENT_CLINIC_OR_DEPARTMENT_OTHER)
Admission: EM | Admit: 2017-12-29 | Discharge: 2017-12-29 | Disposition: A | Discharge status: Home / Self Care | Payer: BLUE CROSS/BLUE SHIELD | Attending: Emergency Medicine | Admitting: Emergency Medicine

## 2017-12-29 ENCOUNTER — Other Ambulatory Visit: Payer: Self-pay

## 2017-12-29 DIAGNOSIS — R112 Nausea with vomiting, unspecified: Secondary | ICD-10-CM | POA: Diagnosis not present

## 2017-12-29 DIAGNOSIS — R197 Diarrhea, unspecified: Secondary | ICD-10-CM | POA: Diagnosis not present

## 2017-12-29 DIAGNOSIS — Z79899 Other long term (current) drug therapy: Secondary | ICD-10-CM | POA: Insufficient documentation

## 2017-12-29 DIAGNOSIS — E860 Dehydration: Secondary | ICD-10-CM | POA: Diagnosis not present

## 2017-12-29 DIAGNOSIS — Z87891 Personal history of nicotine dependence: Secondary | ICD-10-CM | POA: Insufficient documentation

## 2017-12-29 LAB — URINALYSIS, MICROSCOPIC (REFLEX)

## 2017-12-29 LAB — URINALYSIS, ROUTINE W REFLEX MICROSCOPIC
Glucose, UA: NEGATIVE mg/dL
Leukocytes, UA: NEGATIVE
NITRITE: NEGATIVE
PROTEIN: 100 mg/dL — AB
pH: 6.5 (ref 5.0–8.0)

## 2017-12-29 LAB — COMPREHENSIVE METABOLIC PANEL
ALBUMIN: 5.3 g/dL — AB (ref 3.5–5.0)
ALK PHOS: 59 U/L (ref 38–126)
ALT: 20 U/L (ref 0–44)
ANION GAP: 22 — AB (ref 5–15)
AST: 38 U/L (ref 15–41)
BILIRUBIN TOTAL: 1.1 mg/dL (ref 0.3–1.2)
BUN: 15 mg/dL (ref 6–20)
CALCIUM: 9.7 mg/dL (ref 8.9–10.3)
CO2: 20 mmol/L — ABNORMAL LOW (ref 22–32)
Chloride: 96 mmol/L — ABNORMAL LOW (ref 98–111)
Creatinine, Ser: 0.88 mg/dL (ref 0.44–1.00)
GFR calc non Af Amer: >60 mL/min (ref >60)
GLUCOSE: 107 mg/dL — AB (ref 70–99)
Potassium: 3.5 mmol/L (ref 3.5–5.1)
Sodium: 138 mmol/L (ref 135–145)
Total Protein: 8.9 g/dL — ABNORMAL HIGH (ref 6.5–8.1)

## 2017-12-29 LAB — CBC
HCT: 43.7 % (ref 36.0–46.0)
HEMOGLOBIN: 15.3 g/dL — AB (ref 12.0–15.0)
MCH: 30.1 pg (ref 26.0–34.0)
MCHC: 35 g/dL (ref 30.0–36.0)
MCV: 86 fL (ref 78.0–100.0)
PLATELETS: 339 10*3/uL (ref 150–400)
RBC: 5.08 MIL/uL (ref 3.87–5.11)
RDW: 13.1 % (ref 11.5–15.5)
WBC: 18.5 10*3/uL — ABNORMAL HIGH (ref 4.0–10.5)

## 2017-12-29 LAB — LIPASE, BLOOD: Lipase: 18 U/L (ref 11–51)

## 2017-12-29 LAB — PREGNANCY, URINE: PREG TEST UR: NEGATIVE

## 2017-12-29 MED ORDER — PROMETHAZINE HCL 25 MG/ML IJ SOLN
25.0000 mg | Freq: Once | INTRAMUSCULAR | Status: AC
  Administered 2017-12-29: 25 mg via INTRAVENOUS
  Filled 2017-12-29: qty 1

## 2017-12-29 MED ORDER — SODIUM CHLORIDE 0.9 % IV BOLUS
1000.0000 mL | Freq: Once | INTRAVENOUS | Status: AC
  Administered 2017-12-29: 1000 mL via INTRAVENOUS

## 2017-12-29 MED ORDER — PROMETHAZINE HCL 25 MG PO TABS: 25.0000 mg | ORAL_TABLET | Freq: Four times a day (QID) | ORAL | 0 refills | Status: DC | PRN

## 2017-12-29 NOTE — ED Provider Notes (Signed)
[facility] Provider Note   CSN: [id number] Arrival date & time: 12/29/17  1922     History   Chief Complaint Chief Complaint  Patient presents with  . Emesis  . Diarrhea    HPI Angela Dixon is a 32 y.o. female.  She is here with acute onset of nausea vomiting since 4 AM.  Multiple episodes of vomiting nonbloody material.  A few episodes of some loose stools.  A little bit abdominal cramps but usually associated just before she vomits.  No fevers no chills.  She is complaining of some tingling all over and some spasm in her fingers from being so dehydrated.  No sick contacts no recent travel.  She is tried some oral Zofran without any relief.  The history is provided by the patient.  Emesis   This is a new problem. The current episode started 12 to 24 hours ago. The problem occurs more than 10 times per day. The problem has not changed since onset.The emesis has an appearance of stomach contents. There has been no fever. Associated symptoms include abdominal pain and diarrhea. Pertinent negatives include no chills, no cough, no fever, no headaches and no URI.  Diarrhea   Associated symptoms include abdominal pain and vomiting. Pertinent negatives include no chills, no headaches, no URI and no cough.    Past Medical History:  Diagnosis Date  . Abnormal Pap smear   . Acid reflux   . Anemia   . Chlamydia 2006  . Fracture   . Hypercholesterolemia   . Vitamin D deficiency     Patient Active Problem List   Diagnosis Date Noted  . GERD (gastroesophageal reflux disease) 01/09/2017  . Medication management 01/31/2015  . Depression 02/08/2014  . Generalized anxiety disorder 02/08/2014  . Vitamin D deficiency     Past Surgical History:  Procedure Laterality Date  . COLPOSCOPY  2006  . WISDOM TOOTH EXTRACTION       OB History    Gravida  2   Para  1   Term  1   Preterm      AB      Living  1     SAB      TAB      Ectopic      Multiple      Live Births  1            Home Medications    Prior to Admission medications   Medication Sig Start Date End Date Taking? Authorizing Provider  albuterol (PROVENTIL HFA;VENTOLIN HFA) 108 (90 Base) MCG/ACT inhaler Inhale 2 puffs into the lungs every 4 hours as needed for wheezing or shortness of breath. 01/16/17   Unk Pinto, MD  azelastine (ASTELIN) 0.1 % nasal spray Place 2 sprays into both nostrils 2 (two) times daily. Use in each nostril as directed 01/16/17 01/16/18  Unk Pinto, MD  cephALEXin (KEFLEX) 500 MG capsule Take 1 capsule 4 x/day after meals & bedtime for infection 12/09/17   Liane Comber, NP  cetirizine (ZYRTEC) 10 MG tablet Take 10 mg by mouth daily.    [provider]  Cholecalciferol 4000 UNITS CAPS Take 1 capsule by mouth daily.    [provider]  Flaxseed, Linseed, (FLAX SEED OIL) 1000 MG CAPS Take by mouth daily.    [provider]  Magnesium 500 MG CAPS Take by mouth daily.    [provider]  methocarbamol (ROBAXIN-750) 750 MG tablet Take 1 tablet (750  mg total) by mouth every 8 (eight) hours as needed for muscle spasms. 09/19/17   Liane Comber, NP  Multiple Vitamin (MULTIVITAMIN WITH MINERALS) TABS tablet Take 1 tablet by mouth daily.    [provider]  omeprazole (PRILOSEC) 20 MG capsule TAKE 1 CAPSULE BY MOUTH EVERY DAY 12/23/17   [name], PA-C  predniSONE (DELTASONE) 20 MG tablet 2 tablets daily for 3 days, 1 tablet daily for 4 days. 09/19/17   Liane Comber, NP  Probiotic Product (PROBIOTIC DAILY PO) Take by mouth.    [provider]  ranitidine (ZANTAC) 300 MG tablet Take 1/2-1 tab twice daily for acid reflux. 10/30/17   Liane Comber, NP    Family History Family History  Problem Relation Age of Onset  . Hyperlipidemia Mother   . Asthma Sister   . Hypertension Maternal Grandmother   . Diabetes Maternal Grandmother   . Heart disease Maternal Grandfather   .  Cancer Paternal Grandmother        throat  . Cancer Paternal Grandfather        lung    Social History Social History   Tobacco Use  . Smoking status: Former Smoker    Packs/day: 1.00    Types: Cigarettes    Last attempt to quit: 07/09/2006    Years since quitting: 11.4  . Smokeless tobacco: Never Used  Substance Use Topics  . Alcohol use: No  . Drug use: No     Allergies   Neomycin   Review of Systems Review of Systems  Constitutional: Negative for chills and fever.  HENT: Negative for sore throat.   Eyes: Negative for visual disturbance.  Respiratory: Negative for cough and shortness of breath.   Cardiovascular: Negative for chest pain.  Gastrointestinal: Positive for abdominal pain, diarrhea and vomiting.  Genitourinary: Negative for dysuria.  Musculoskeletal: Negative for neck pain.  Skin: Negative for rash.  Neurological: Negative for headaches.     Physical Exam Updated Vital Signs BP 109/76 (BP Location: Left Arm)   Pulse 75   Temp 99.1 F (37.3 C) (Oral)   Resp 20   Ht 5\' 5"  (1.651 m)   Wt 92.5 kg   SpO2 100%   BMI 33.93 kg/m   Physical Exam  Constitutional: She appears well-developed and well-nourished. No distress.  HENT:  Head: Normocephalic and atraumatic.  Eyes: Conjunctivae are normal.  Neck: Neck supple.  Cardiovascular: Normal rate, regular rhythm and normal heart sounds.  No murmur heard. Pulmonary/Chest: Effort normal and breath sounds normal. No respiratory distress.  Abdominal: Soft. She exhibits no mass. There is no tenderness. There is no rebound and no guarding.  Musculoskeletal: Normal range of motion. She exhibits no edema, tenderness or deformity.  Neurological: She is alert.  Skin: Skin is warm and dry. Capillary refill takes less than 2 seconds.  Psychiatric: She has a normal mood and affect.  Nursing note and vitals reviewed.    ED Treatments / Results  Labs (all labs ordered are listed, but only abnormal results are  displayed) Labs Reviewed  CBC - Abnormal; Notable for the following components:      Result Value   WBC 18.5 (*)    Hemoglobin 15.3 (*)    All other components within normal limits  URINALYSIS, ROUTINE W REFLEX MICROSCOPIC - Abnormal; Notable for the following components:   Specific Gravity, Urine >1.030 (*)    Hgb urine dipstick TRACE (*)    Bilirubin Urine SMALL (*)    Ketones, ur >80 (*)  Protein, ur 100 (*)    All other components within normal limits  URINALYSIS, MICROSCOPIC (REFLEX) - Abnormal; Notable for the following components:   Bacteria, UA FEW (*)    All other components within normal limits  PREGNANCY, URINE  LIPASE, BLOOD  COMPREHENSIVE METABOLIC PANEL    EKG None  Radiology No results found.  Procedures Procedures (including critical care time)  Medications Ordered in ED Medications  sodium chloride 0.9 % bolus 1,000 mL (has no administration in time range)  promethazine (PHENERGAN) injection 25 mg (has no administration in time range)     Initial Impression / Assessment and Plan / ED Course  I have reviewed the triage vital signs and the nursing notes.  Pertinent labs & imaging results that were available during my care of the patient were reviewed by me and considered in my medical decision making (see chart for details).  Clinical Course as of Dec 30 1124  Mon Dec 29, 2017  2144 She is feeling a little bit better after the nausea medicine and the first liter of fluids.  Her throat still bothering her from having been vomiting.  She is agreeable to starting another liter fluid and starting to p.o. trial.   [MB]    Clinical Course User Index [MB] Hayden Rasmussen, MD     Final Clinical Impressions(s) / ED Diagnoses   Final diagnoses:  Nausea vomiting and diarrhea  Dehydration    ED Discharge Orders         Ordered    promethazine (PHENERGAN) 25 MG tablet  Every 6 hours PRN     12/29/17 2249           Hayden Rasmussen,  MD 12/30/17 1127

## 2017-12-29 NOTE — Discharge Instructions (Signed)
You were evaluated in the emergency department for severe episode of nausea vomiting and diarrhea.  You were given IV fluids and nausea medicine with improvement in your symptoms.  We have sent a prescription to your pharmacy for more nausea medication if needed.  You should continue to try to stay well-hydrated with electrolyte-containing solutions like Pedialyte or sports drinks.  Advance to regular diet as tolerated.

## 2017-12-29 NOTE — ED Triage Notes (Signed)
Nausea, vomiting, and diarrhea since 4 am. She feels dehydrated. She has been hyperventilating at home and her hands are contracted on arrival.

## 2017-12-31 DIAGNOSIS — Z1151 Encounter for screening for human papillomavirus (HPV): Secondary | ICD-10-CM | POA: Diagnosis not present

## 2017-12-31 DIAGNOSIS — Z23 Encounter for immunization: Secondary | ICD-10-CM | POA: Diagnosis not present

## 2017-12-31 DIAGNOSIS — Z6833 Body mass index (BMI) 33.0-33.9, adult: Secondary | ICD-10-CM | POA: Diagnosis not present

## 2017-12-31 DIAGNOSIS — Z01419 Encounter for gynecological examination (general) (routine) without abnormal findings: Secondary | ICD-10-CM | POA: Diagnosis not present

## 2018-01-17 NOTE — Progress Notes (Signed)
Complete Physical  Assessment and Plan:  Routine general medical examination at a health care facility 1 year  Medication management -     CBC with Differential/Platelet -     BASIC METABOLIC PANEL WITH GFR -     Hepatic function panel -     Magnesium  Generalized anxiety disorder continue medications, stress management techniques discussed, increase water, good sleep hygiene discussed, increase exercise, and increase veggies.  -     TSH  Recurrent major depressive disorder, in partial remission ([place]) - continue medications, stress management techniques discussed, increase water, good sleep hygiene discussed, increase exercise, and increase veggies.   Vitamin D deficiency -     VITAMIN D 25 Hydroxy (Vit-D Deficiency, Fractures)  Screening for hyperlipidemia -     Lipid panel  Screening for deficiency anemia -     Iron,Total/Total Iron Binding Cap -     Vitamin B12  Needs flu shot -     FLU VACCINE MDCK QUAD W/Preservative  GERD Continue meds, if can get off PPI x 2 weeks will check H pylori  Discussed med's effects and SE's. Screening labs and tests as requested with regular follow-up as recommended. Over 40 minutes of exam, counseling, chart review, and complex, high level critical decision making was performed this visit.   HPI  32 y.o. female  presents for a complete physical and follow up for has Vitamin D deficiency; Depression; Generalized anxiety disorder; Medication management; and GERD (gastroesophageal reflux disease) on their problem list..  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 She denies chest pain, shortness of breath, dizziness.   She is not on cholesterol medication and denies myalgias. Her cholesterol is not at goal. The cholesterol last visit was:   Lab Results  Component Value Date   CHOL 218  (H) 01/09/2017   HDL 56 01/09/2017   LDLCALC 131 (H) 01/09/2017   TRIG 168 (H) 01/09/2017   CHOLHDL 3.9 01/09/2017   Last A1C in the office was:  Lab Results  Component Value Date   HGBA1C 5.2 08/14/2015   Patient is on Vitamin D supplement, 4000 a day.   Lab Results  Component Value Date   VD25OH 28 (L) 01/09/2017     BMI is Body mass index is 34.5 kg/m., she is working on diet and exercise. Wt Readings from Last 3 Encounters:  01/19/18 201 lb (91.2 kg)  12/29/17 203 lb 14.8 oz (92.5 kg)  12/09/17 204 lb (92.5 kg)    Current Medications:  Current Outpatient Medications on File Prior to Visit  Medication Sig Dispense Refill  . albuterol (PROVENTIL HFA;VENTOLIN HFA) 108 (90 Base) MCG/ACT inhaler Inhale 2 puffs into the lungs every 4 hours as needed for wheezing or shortness of breath. 3 Inhaler 0  . cetirizine (ZYRTEC) 10 MG tablet Take 10 mg by mouth daily.    . Cholecalciferol 4000 UNITS CAPS Take 1 capsule by mouth daily.    . Flaxseed, Linseed, (FLAX SEED OIL) 1000 MG CAPS Take by mouth daily.    . Magnesium 500 MG CAPS Take by mouth daily.    . methocarbamol (ROBAXIN-750) 750 MG tablet Take 1 tablet (750 mg total) by mouth every 8 (eight) hours as needed for muscle spasms. 60 tablet 1  . Multiple Vitamin (MULTIVITAMIN WITH MINERALS) TABS tablet Take 1 tablet by  mouth daily.    Marland Kitchen omeprazole (PRILOSEC) 20 MG capsule TAKE 1 CAPSULE BY MOUTH EVERY DAY 90 capsule 0  . Probiotic Product (PROBIOTIC DAILY PO) Take by mouth.    . promethazine (PHENERGAN) 25 MG tablet Take 1 tablet (25 mg total) by mouth every 6 (six) hours as needed for nausea or vomiting. 30 tablet 0  . ranitidine (ZANTAC) 300 MG tablet Take 1/2-1 tab twice daily for acid reflux. 180 tablet 1  . azelastine (ASTELIN) 0.1 % nasal spray Place 2 sprays into both nostrils 2 (two) times daily. Use in each nostril as directed 30 mL 2   No current facility-administered medications on file prior to visit.    Allergies:   Allergies  Allergen Reactions  . Neomycin Rash   Medical History:  She has Vitamin D deficiency; Depression; Generalized anxiety disorder; Medication management; and GERD (gastroesophageal reflux disease) on their problem list.   Health Maintenance:   Immunization History  Administered Date(s) Administered  . Influenza Inj Mdck Quad With Preservative 01/09/2017  . PPD Test 12/09/2013  . Tdap 11/19/2010   Tetanus: 2012 Pneumovax: Prevnar 13:  Flu vaccine: TODAY Zostavax:  Pap: Sept 25 2019 HAS IUD MGM: N/A DEXA: Colonoscopy: N/A EGD: N/A CXR 2015  Patient Care Team: Unk Pinto, MD as PCP - General (Internal Medicine) Dyke Maes, [place] (Optometry) Murrell Redden Earlyne Iba, MD as Consulting Physician (Obstetrics and Gynecology)  Surgical History:  She has a past surgical history that includes Wisdom tooth extraction and Colposcopy (2006). Family History:  Herfamily history includes Asthma in her sister; Cancer in her paternal grandfather and paternal grandmother; Diabetes in her maternal grandmother; Heart disease in her maternal grandfather; Hyperlipidemia in her mother; Hypertension in her maternal grandmother. Social History:  She reports that she quit smoking about 11 years ago. Her smoking use included cigarettes. She smoked 1.00 pack per day. She has never used smokeless tobacco. She reports that she does not drink alcohol or use drugs.  Review of Systems: Review of Systems  Constitutional: Negative.   HENT: Negative.   Eyes: Negative.   Respiratory: Negative.   Cardiovascular: Negative.   Gastrointestinal: Positive for heartburn. Negative for abdominal pain, blood in stool, constipation, diarrhea, melena, nausea and vomiting.  Genitourinary: Negative.   Musculoskeletal: Negative.   Skin: Negative.     Physical Exam: Estimated body mass index is 34.5 kg/m as calculated from the following:   Height as of this encounter: 5\' 4"  (1.626 m).   Weight as of  this encounter: 201 lb (91.2 kg). BP 118/74   Pulse 66   Temp 98.6 F (37 C)   Resp 16   Ht 5\' 4"  (1.626 m)   Wt 201 lb (91.2 kg)   LMP 12/24/2017   SpO2 98%   BMI 34.50 kg/m  General Appearance: Well nourished, in no apparent distress.  Eyes: PERRLA, EOMs, conjunctiva no swelling or erythema, normal fundi and vessels.  Sinuses: No Frontal/maxillary tenderness  ENT/Mouth: Ext aud canals clear, normal light reflex with TMs without erythema, bulging. Good dentition. No erythema, swelling, or exudate on post pharynx. Tonsils not swollen or erythematous. Hearing normal.  Neck: Supple, thyroid normal. No bruits  Respiratory: Respiratory effort normal, BS equal bilaterally without rales, rhonchi, wheezing or stridor.  Cardio: RRR without murmurs, rubs or gallops. Brisk peripheral pulses without edema.  Chest: symmetric, with normal excursions and percussion.  Breasts: Symmetric, without lumps, nipple discharge, retractions.  Abdomen: Soft, nontender, no guarding, rebound, hernias, masses, or organomegaly.  Lymphatics:  Non tender without lymphadenopathy.  Genitourinary:  Musculoskeletal: Full ROM all peripheral extremities,5/5 strength, and normal gait.  Skin: Warm, dry without rashes, lesions, ecchymosis. Neuro: Cranial nerves intact, reflexes equal bilaterally. Normal muscle tone, no cerebellar symptoms. Sensation intact.  Psych: Awake and oriented X 3, normal affect, Insight and Judgment appropriate.    [name] 10:08 AM [facility]

## 2018-01-19 ENCOUNTER — Encounter: Payer: Self-pay | Admitting: Physician Assistant

## 2018-01-19 ENCOUNTER — Ambulatory Visit: Payer: BLUE CROSS/BLUE SHIELD | Admitting: Physician Assistant

## 2018-01-19 VITALS — BP 118/74 | HR 66 | Temp 98.6°F | Resp 16 | Ht 64.0 in | Wt 201.0 lb

## 2018-01-19 DIAGNOSIS — Z79899 Other long term (current) drug therapy: Secondary | ICD-10-CM | POA: Diagnosis not present

## 2018-01-19 DIAGNOSIS — Z1329 Encounter for screening for other suspected endocrine disorder: Secondary | ICD-10-CM | POA: Diagnosis not present

## 2018-01-19 DIAGNOSIS — Z130 Encounter for screening for diseases of the blood and blood-forming organs and certain disorders involving the immune mechanism: Secondary | ICD-10-CM

## 2018-01-19 DIAGNOSIS — Z131 Encounter for screening for diabetes mellitus: Secondary | ICD-10-CM

## 2018-01-19 DIAGNOSIS — Z13220 Encounter for screening for lipoid disorders: Secondary | ICD-10-CM

## 2018-01-19 DIAGNOSIS — Z1389 Encounter for screening for other disorder: Secondary | ICD-10-CM

## 2018-01-19 DIAGNOSIS — E559 Vitamin D deficiency, unspecified: Secondary | ICD-10-CM

## 2018-01-19 DIAGNOSIS — F411 Generalized anxiety disorder: Secondary | ICD-10-CM

## 2018-01-19 DIAGNOSIS — F3341 Major depressive disorder, recurrent, in partial remission: Secondary | ICD-10-CM

## 2018-01-19 DIAGNOSIS — K219 Gastro-esophageal reflux disease without esophagitis: Secondary | ICD-10-CM

## 2018-01-19 DIAGNOSIS — Z0000 Encounter for general adult medical examination without abnormal findings: Secondary | ICD-10-CM | POA: Diagnosis not present

## 2018-01-19 NOTE — Patient Instructions (Addendum)
GENERAL HEALTH GOALS  Know what a healthy weight is for you (roughly BMI <25) and aim to maintain this  Aim for 7+ servings of fruits and vegetables daily  70-80+ fluid ounces of water or unsweet tea for healthy kidneys  Limit to max 1 drink of alcohol per day; avoid smoking/tobacco  Limit animal fats in diet for cholesterol and heart health - choose grass fed whenever available  Avoid highly processed foods, and foods high in saturated/trans fats  Aim for low stress - take time to unwind and care for your mental health  Aim for 150 min of moderate intensity exercise weekly for heart health, and weights twice weekly for bone health  Aim for 7-9 hours of sleep daily  ZANTAC RECALL  Please advise to switch from zantac to famotidine (pepcid) 20-40 mg. - if you are prescribed zantac, you can check with pharmacist about the brand that you are on to see if you can continue it. The contaminant is found in small amounts of various foods as well and it is a voluntary recall.   LDL CHOLESTEROL Your LDL could improve, ideally we want it under a 100.  Your LDL is the bad cholesterol that can lead to heart attack and stroke. To lower your number you can decrease your fatty foods, red meat, cheese, milk and increase fiber like whole grains and veggies. You can also add a fiber supplement like Citracel or Benefiber, these do not cause gas and bloating and are safe to use. Especially if you have a strong family history of heart disease or stroke or you have evidence of plaque on any imaging like a chest xray, we may discuss at your next office visit putting you on a medication to get your number below 100.   VITAMIN D IS IMPORTANT  Vitamin D goal is between 60-80  Please make sure that you are taking your Vitamin D as directed.   It is very important as a natural anti-inflammatory   helping hair, skin, and nails, as well as reducing stroke and heart attack risk.   It helps your bones and helps  with mood.  We want you on at least 5000 IU daily  It also decreases numerous cancer risks so please take it as directed.   Low Vit D is associated with a 200-300% higher risk for CANCER   and 200-300% higher risk for HEART   ATTACK  &  STROKE.    .....................................Marland Kitchen  It is also associated with higher death rate at younger ages,   autoimmune diseases like Rheumatoid arthritis, Lupus, Multiple Sclerosis.     Also many other serious conditions, like depression, Alzheimer's  Dementia, infertility, muscle aches, fatigue, fibromyalgia - just to name a few.  +++++++++++++++++++  Can get liquid vitamin D from [facility] here in [place] at  [facility] alternatives [address] [place], [place] [address] Or you can try earth fare  8 Critical Weight-Loss Tips That Aren't Diet and Exercise  1. STARVE THE DISTRACTIONS  All too often when we eat, we're also multitasking: watching TV, answering emails, scrolling through social media. These habits are detrimental to having a strong, clear, healthy relationship with food, and they can hinder our ability to make dietary changes.  In order to truly focus on what you're eating, how much you're eating, why you're eating those specific foods and, most importantly, how those foods make you feel, you need to starve the distractions. That means when you eat, just eat. Focus  on your food, the process it went through to end up on your plate, where it came from and how it nourishes you. With this technique, you're more likely to finish a meal feeling satiated.  2.  CONSIDER WHAT YOU'RE NOT WILLING TO DO  This might sound counterintuitive, but it can help provide a "why" when motivation is waning. Declare, in writing, what you are unwilling to do, for example "I am unwilling to be the old dad who cannot play sports with my children".  So consider what you're not willing to accept, write it down, and keep it at the ready.  3.  STOP  LABELING FOOD "GOOD" AND "BAD"  You've probably heard someone say they ate something "bad." Maybe you've even said it yourself.  The trouble with 'bad' foods isn't that they'll send you to the grave after a bite or two. The trouble comes when we eat excessive portions of really calorie-dense foods meal after meal, day after day.  Instead of labeling foods as good or bad, think about which foods you can eat a lot of, and which ones you should just eat a little of. Then, plan ways to eat the foods you really like in portions that fit with your overall goals. A good example of this would be having a slice of pizza alongside a club salad with chicken breast, avocado and a bit of dressing. This is vastly different than 3 slices of pizza, 4 breadsticks with cheese sauce and half of a liter of regular soda.  4.  BRUSH YOUR TEETH AFTER YOU EAT  Getting your mindset in order is important, but sometimes small habits can make a big difference. After eating, you still have the taste of food in their mouth, which often causes people to eat more even if they are full or engage in a nibble or two of dessert.  Brushing your teeth will remove the taste of food from your mouth, and the clean, minty freshness will serve as a cue that mealtime is over.  5.  FOCUS ON CROWDING NOT CUTTING  The most common first step during 'dieting' is to cut. We cut our portion sizes down, we cut out 'bad' foods, we cut out entire food groups. This act of cutting puts [place] and our minds into scarcity mode.  When something is off-limits, even if you're able to avoid it for a while, you could end up bingeing on it later because you've gone so long without it. So, instead of cutting, focus on crowding. If you crowd your plate and fill it up with more foods like veggies and protein, it simply allows less room for the other stuff. In other words, shift your focus away from what you can't eat, and celebrate the foods that will help you reach  your goals.  6.  TAKE TRACKING A STEP FURTHER  Track what you eat, when you ate it, how much you ate and how that food made you feel. Being completely honest with yourself and writing down every single thing that passes through your lips will help you start to notice that maybe you actually do snack, possibly take in more sugar than you thought, eat when you're bored rather than just hungry or maybe that you have a habit of snacking before bed while watching TV.  The difference from simply tracking your food intake is you're taking into account how food makes you feel, as well as what you're doing while you're eating. This is about becoming  more mindful of what, when and why you eat.  7.  PRIORITIZE GOOD SLEEP  One of the strongest risk factors for being overweight is poor sleep. When you're feeling tired, you're more likely to choose unhealthy comfort foods and to skip your workout. Additionally, sleep deprivation may slow down your metabolism. Vesta [employer]! Therefore, sleeping 7-8 hours per night can help with weight loss without having to change your diet or increase your physical activity. And if you feel you snore and still wake up tired, talk with me about sleep apnea.  8.  SET ASIDE TIME TO DISCONNECT  Just get out there. Disconnect from the electronics and connect to the elements. Not only will this help reduce stress (a major factor in weight gain) by giving your mind a break from the constant stimulation we've all become so accustomed to, but it may also reprogram your brain to connect with yourself and what you're feeling.

## 2018-01-20 LAB — HEMOGLOBIN A1C
EAG (MMOL/L): 5.4 (calc)
HEMOGLOBIN A1C: 5 %{Hb} (ref <5.7)
MEAN PLASMA GLUCOSE: 97 (calc)

## 2018-01-20 LAB — IRON, TOTAL/TOTAL IRON BINDING CAP
%SAT: 18 % (calc) (ref 16–45)
Iron: 68 ug/dL (ref 40–190)
TIBC: 374 mcg/dL (calc) (ref 250–450)

## 2018-01-20 LAB — CBC WITH DIFFERENTIAL/PLATELET
Basophils Absolute: 36 cells/uL (ref 0–200)
Basophils Relative: 0.4 %
EOS PCT: 4.4 %
Eosinophils Absolute: 400 cells/uL (ref 15–500)
HCT: 41.8 % (ref 35.0–45.0)
HEMOGLOBIN: 14.1 g/dL (ref 11.7–15.5)
Lymphs Abs: 2321 cells/uL (ref 850–3900)
MCH: 29.4 pg (ref 27.0–33.0)
MCHC: 33.7 g/dL (ref 32.0–36.0)
MCV: 87.3 fL (ref 80.0–100.0)
MPV: 10.6 fL (ref 7.5–12.5)
Monocytes Relative: 8.6 %
Neutro Abs: 5560 cells/uL (ref 1500–7800)
Neutrophils Relative %: 61.1 %
Platelets: 273 10*3/uL (ref 140–400)
RBC: 4.79 10*6/uL (ref 3.80–5.10)
RDW: 12.9 % (ref 11.0–15.0)
Total Lymphocyte: 25.5 %
WBC mixed population: 783 cells/uL (ref 200–950)
WBC: 9.1 10*3/uL (ref 3.8–10.8)

## 2018-01-20 LAB — COMPLETE METABOLIC PANEL WITH GFR
AG RATIO: 1.6 (calc) (ref 1.0–2.5)
ALT: 10 U/L (ref 6–29)
AST: 15 U/L (ref 10–30)
Albumin: 4.5 g/dL (ref 3.6–5.1)
Alkaline phosphatase (APISO): 69 U/L (ref 33–115)
BILIRUBIN TOTAL: 0.4 mg/dL (ref 0.2–1.2)
BUN: 10 mg/dL (ref 7–25)
CHLORIDE: 105 mmol/L (ref 98–110)
CO2: 25 mmol/L (ref 20–32)
Calcium: 9.5 mg/dL (ref 8.6–10.2)
Creat: 0.66 mg/dL (ref 0.50–1.10)
GFR, Est African American: 135 mL/min/{1.73_m2} (ref >=60)
GFR, Est Non African American: 116 mL/min/{1.73_m2} (ref >=60)
GLUCOSE: 81 mg/dL (ref 65–99)
Globulin: 2.8 g/dL (calc) (ref 1.9–3.7)
POTASSIUM: 4.2 mmol/L (ref 3.5–5.3)
Sodium: 138 mmol/L (ref 135–146)
Total Protein: 7.3 g/dL (ref 6.1–8.1)

## 2018-01-20 LAB — LIPID PANEL
CHOLESTEROL: 208 mg/dL — AB (ref <200)
HDL: 63 mg/dL (ref >50)
LDL Cholesterol (Calc): 116 mg/dL (calc) — ABNORMAL HIGH
Non-HDL Cholesterol (Calc): 145 mg/dL (calc) — ABNORMAL HIGH (ref <130)
Total CHOL/HDL Ratio: 3.3 (calc) (ref <5.0)
Triglycerides: 172 mg/dL — ABNORMAL HIGH (ref <150)

## 2018-01-20 LAB — URINALYSIS, ROUTINE W REFLEX MICROSCOPIC
Bilirubin Urine: NEGATIVE
Glucose, UA: NEGATIVE
HGB URINE DIPSTICK: NEGATIVE
KETONES UR: NEGATIVE
Leukocytes, UA: NEGATIVE
NITRITE: NEGATIVE
PROTEIN: NEGATIVE
Specific Gravity, Urine: 1.021 (ref 1.001–1.03)
pH: 5.5 (ref 5.0–8.0)

## 2018-01-20 LAB — MICROALBUMIN / CREATININE URINE RATIO
CREATININE, URINE: 125 mg/dL (ref 20–275)
Microalb Creat Ratio: 2 mcg/mg creat (ref <30)
Microalb, Ur: 0.3 mg/dL

## 2018-01-20 LAB — VITAMIN B12: Vitamin B-12: 1486 pg/mL — ABNORMAL HIGH (ref 200–1100)

## 2018-01-20 LAB — VITAMIN D 25 HYDROXY (VIT D DEFICIENCY, FRACTURES): Vit D, 25-Hydroxy: 32 ng/mL (ref 30–100)

## 2018-01-20 LAB — MAGNESIUM: Magnesium: 1.8 mg/dL (ref 1.5–2.5)

## 2018-01-20 LAB — TSH: TSH: 0.99 m[IU]/L

## 2018-03-01 ENCOUNTER — Other Ambulatory Visit: Payer: Self-pay | Admitting: Internal Medicine

## 2018-03-29 ENCOUNTER — Other Ambulatory Visit: Payer: Self-pay | Admitting: Physician Assistant

## 2018-03-29 DIAGNOSIS — K219 Gastro-esophageal reflux disease without esophagitis: Secondary | ICD-10-CM

## 2018-04-01 NOTE — Progress Notes (Signed)
Assessment and Plan:   Generalized anxiety disorder -     citalopram (CELEXA) 20 MG tablet; Take 1 tablet (20 mg total) by mouth daily. - continue counseling - if any AEs or issues will try effexor or pristiq - patient will message - follow up 2 months     Future Appointments  Date Time Provider [place]  01/27/2019 10:00 AM [name], PA-C [facility] None    HPI 32 y.o.female presents for depression.  She is seeing a Social worker and marriage counselor but would like to try a medication.  She has excessive worrying, dwells on things, will over react to things. On and off trouble sleeping.  She has been on zoloft in the past, states helped mood wise, but had sweating, trouble thinking, weird dreams.  BMI is Body mass index is 36.39 kg/m., she is working on diet and exercise. Wt Readings from Last 3 Encounters:  04/02/18 212 lb (96.2 kg)  01/19/18 201 lb (91.2 kg)  12/29/17 203 lb 14.8 oz (92.5 kg)      Past Medical History:  Diagnosis Date  . Abnormal Pap smear   . Acid reflux   . Anemia   . Chlamydia 2006  . Fracture   . Hypercholesterolemia   . Vitamin D deficiency      Allergies  Allergen Reactions  . Neomycin Rash    Current Outpatient Medications on File Prior to Visit  Medication Sig  . albuterol (PROVENTIL HFA;VENTOLIN HFA) 108 (90 Base) MCG/ACT inhaler Inhale 2 puffs into the lungs every 4 hours as needed for wheezing or shortness of breath.  Marland Kitchen azelastine (ASTELIN) 0.1 % nasal spray SPRAY 1 SPRAY INTO BOTH NOSTRILS TWICE DAILY  . cetirizine (ZYRTEC) 10 MG tablet Take 10 mg by mouth daily.  . Cholecalciferol 4000 UNITS CAPS Take 1 capsule by mouth daily.  . Flaxseed, Linseed, (FLAX SEED OIL) 1000 MG CAPS Take by mouth daily.  . Magnesium 500 MG CAPS Take by mouth daily.  . methocarbamol (ROBAXIN-750) 750 MG tablet Take 1 tablet (750 mg total) by mouth every 8 (eight) hours as needed for muscle spasms.  . Multiple Vitamin (MULTIVITAMIN  WITH MINERALS) TABS tablet Take 1 tablet by mouth daily.  Marland Kitchen omeprazole (PRILOSEC) 20 MG capsule Take 1 capsule daily for Acid Reflyx & Indigestion  . Probiotic Product (PROBIOTIC DAILY PO) Take by mouth.  . promethazine (PHENERGAN) 25 MG tablet Take 1 tablet (25 mg total) by mouth every 6 (six) hours as needed for nausea or vomiting.  . ranitidine (ZANTAC) 300 MG tablet Take 1/2-1 tab twice daily for acid reflux.   No current facility-administered medications on file prior to visit.     ROS: all negative except above.   Physical Exam: There were no vitals filed for this visit. There were no vitals taken for this visit. General Appearance: Well nourished, in no apparent distress. Eyes: PERRLA, EOMs, conjunctiva no swelling or erythema Sinuses: No Frontal/maxillary tenderness ENT/Mouth: Ext aud canals clear, TMs without erythema, bulging. No erythema, swelling, or exudate on post pharynx.  Tonsils not swollen or erythematous. Hearing normal.  Neck: Supple, thyroid normal.  Respiratory: Respiratory effort normal, BS equal bilaterally without rales, rhonchi, wheezing or stridor.  Cardio: RRR with no MRGs. Brisk peripheral pulses without edema.  Abdomen: Soft, + BS.  Non tender, no guarding, rebound, hernias, masses. Lymphatics: Non tender without lymphadenopathy.  Musculoskeletal: Full ROM, 5/5 strength, normal gait.  Skin: Warm, dry without rashes, lesions, ecchymosis.  Neuro: Cranial nerves intact.  Normal muscle tone, no cerebellar symptoms. Sensation intact.  Psych: Awake and oriented X 3, normal affect, Insight and Judgment appropriate.     [name], PA-C 8:02 PM [facility]

## 2018-04-02 ENCOUNTER — Ambulatory Visit: Payer: BLUE CROSS/BLUE SHIELD | Admitting: Physician Assistant

## 2018-04-02 ENCOUNTER — Encounter: Payer: Self-pay | Admitting: Physician Assistant

## 2018-04-02 VITALS — BP 116/80 | HR 73 | Temp 97.8°F | Ht 64.0 in | Wt 212.0 lb

## 2018-04-02 DIAGNOSIS — F411 Generalized anxiety disorder: Secondary | ICD-10-CM

## 2018-04-02 MED ORDER — CITALOPRAM HYDROBROMIDE 20 MG PO TABS: 20.0000 mg | ORAL_TABLET | Freq: Every day | ORAL | 2 refills | Status: DC

## 2018-04-02 NOTE — Patient Instructions (Signed)
Try the celexa, if this does not work we can try the effexor for anxiety Depending on how you did may do pristiq  Give if 4 weeks to work, if any side effects let me know  As about CBT therapy  Citalopram tablets What is this medicine? CITALOPRAM (sye TAL oh pram) is a medicine for depression. This medicine may be used for other purposes; ask your health care provider or pharmacist if you have questions. COMMON BRAND NAME(S): Celexa What should I tell my health care provider before I take this medicine? They need to know if you have any of these conditions: -bleeding disorders -bipolar disorder or a family history of bipolar disorder -glaucoma -heart disease -history of irregular heartbeat -kidney disease -liver disease -low levels of magnesium or potassium in the blood -receiving electroconvulsive therapy -seizures -suicidal thoughts, plans, or attempt; a previous suicide attempt by you or a family member -take medicines that treat or prevent blood clots -thyroid disease -an unusual or allergic reaction to citalopram, escitalopram, other medicines, foods, dyes, or preservatives -pregnant or trying to become pregnant -breast-feeding How should I use this medicine? Take this medicine by mouth with a glass of water. Follow the directions on the prescription label. You can take it with or without food. Take your medicine at regular intervals. Do not take your medicine more often than directed. Do not stop taking this medicine suddenly except upon the advice of your doctor. Stopping this medicine too quickly may cause serious side effects or your condition may worsen. A special MedGuide will be given to you by the pharmacist with each prescription and refill. Be sure to read this information carefully each time. Talk to your pediatrician regarding the use of this medicine in children. Special care may be needed. Patients over 70 years old may have a stronger reaction and need a smaller  dose. Overdosage: If you think you have taken too much of this medicine contact a poison control center or emergency room at once. NOTE: This medicine is only for you. Do not share this medicine with others. What if I miss a dose? If you miss a dose, take it as soon as you can. If it is almost time for your next dose, take only that dose. Do not take double or extra doses. What may interact with this medicine? Do not take this medicine with any of the following medications: -certain medicines for fungal infections like fluconazole, itraconazole, ketoconazole, posaconazole, voriconazole -cisapride -dofetilide -dronedarone -escitalopram -linezolid -MAOIs like Carbex, Eldepryl, Marplan, Nardil, and Parnate -methylene blue (injected into a vein) -pimozide -thioridazine -ziprasidone This medicine may also interact with the following medications: -alcohol -amphetamines -aspirin and aspirin-like medicines -carbamazepine -certain medicines for depression, anxiety, or psychotic disturbances -certain medicines for infections like chloroquine, clarithromycin, erythromycin, furazolidone, isoniazid, pentamidine -certain medicines for migraine headaches like almotriptan, eletriptan, frovatriptan, naratriptan, rizatriptan, sumatriptan, zolmitriptan -certain medicines for sleep -certain medicines that treat or prevent blood clots like dalteparin, enoxaparin, warfarin -cimetidine -diuretics -fentanyl -lithium -methadone -metoprolol -NSAIDs, medicines for pain and inflammation, like ibuprofen or naproxen -omeprazole -other medicines that prolong the QT interval (cause an abnormal heart rhythm) -procarbazine -rasagiline -supplements like St. John's wort, kava kava, valerian -tramadol -tryptophan This list may not describe all possible interactions. Give your health care provider a list of all the medicines, herbs, non-prescription drugs, or dietary supplements you use. Also tell them if you  smoke, drink alcohol, or use illegal drugs. Some items may interact with your medicine. What should I watch  for while using this medicine? Tell your doctor if your symptoms do not get better or if they get worse. Visit your doctor or health care professional for regular checks on your progress. Because it may take several weeks to see the full effects of this medicine, it is important to continue your treatment as prescribed by your doctor. Patients and their families should watch out for new or worsening thoughts of suicide or depression. Also watch out for sudden changes in feelings such as feeling anxious, agitated, panicky, irritable, hostile, aggressive, impulsive, severely restless, overly excited and hyperactive, or not being able to sleep. If this happens, especially at the beginning of treatment or after a change in dose, call your health care professional. Dennis Bast may get drowsy or dizzy. Do not drive, use machinery, or do anything that needs mental alertness until you know how this medicine affects you. Do not stand or sit up quickly, especially if you are an older patient. This reduces the risk of dizzy or fainting spells. Alcohol may interfere with the effect of this medicine. Avoid alcoholic drinks. Your mouth may get dry. Chewing sugarless gum or sucking hard candy, and drinking plenty of water will help. Contact your doctor if the problem does not go away or is severe. What side effects may I notice from receiving this medicine? Side effects that you should report to your doctor or health care professional as soon as possible: -allergic reactions like skin rash, itching or hives, swelling of the face, lips, or tongue -anxious -black, tarry stools -breathing problems -changes in vision -chest pain -confusion -elevated mood, decreased need for sleep, racing thoughts, impulsive behavior -eye pain -fast, irregular heartbeat -feeling faint or lightheaded, falls -feeling agitated, angry, or  irritable -hallucination, loss of contact with reality -loss of balance or coordination -loss of memory -painful or prolonged erections -restlessness, pacing, inability to keep still -seizures -stiff muscles -suicidal thoughts or other mood changes -trouble sleeping -unusual bleeding or bruising -unusually weak or tired -vomiting Side effects that usually do not require medical attention (report to your doctor or health care professional if they continue or are bothersome): -change in appetite or weight -change in sex drive or performance -dizziness -headache -increased sweating -indigestion, nausea -tremors This list may not describe all possible side effects. Call your doctor for medical advice about side effects. You may report side effects to FDA at 1-800-FDA-1088. Where should I keep my medicine? Keep out of reach of children. Store at room temperature between 15 and 30 degrees C (59 and 86 degrees F). Throw away any unused medicine after the expiration date. NOTE: This sheet is a summary. It may not cover all possible information. If you have questions about this medicine, talk to your doctor, pharmacist, or health care provider.  2019 Elsevier/Gold Standard (2015-08-28 13:18:52)

## 2018-04-21 MED ORDER — VENLAFAXINE HCL ER 37.5 MG PO CP24: 37.5000 mg | ORAL_CAPSULE | Freq: Two times a day (BID) | ORAL | 0 refills | Status: DC

## 2018-05-08 DIAGNOSIS — F1010 Alcohol abuse, uncomplicated: Secondary | ICD-10-CM | POA: Diagnosis not present

## 2018-05-08 DIAGNOSIS — R112 Nausea with vomiting, unspecified: Secondary | ICD-10-CM | POA: Diagnosis not present

## 2018-05-08 DIAGNOSIS — R197 Diarrhea, unspecified: Secondary | ICD-10-CM | POA: Diagnosis not present

## 2018-05-08 DIAGNOSIS — E860 Dehydration: Secondary | ICD-10-CM | POA: Diagnosis not present

## 2018-05-10 ENCOUNTER — Emergency Department (HOSPITAL_COMMUNITY): Payer: BLUE CROSS/BLUE SHIELD

## 2018-05-10 ENCOUNTER — Encounter (HOSPITAL_COMMUNITY): Payer: Self-pay | Admitting: Emergency Medicine

## 2018-05-10 ENCOUNTER — Emergency Department (HOSPITAL_COMMUNITY)
Admission: EM | Admit: 2018-05-10 | Discharge: 2018-05-10 | Disposition: A | Discharge status: Home / Self Care | Payer: BLUE CROSS/BLUE SHIELD | Attending: Emergency Medicine | Admitting: Emergency Medicine

## 2018-05-10 DIAGNOSIS — R079 Chest pain, unspecified: Secondary | ICD-10-CM | POA: Diagnosis not present

## 2018-05-10 DIAGNOSIS — R112 Nausea with vomiting, unspecified: Secondary | ICD-10-CM | POA: Insufficient documentation

## 2018-05-10 DIAGNOSIS — R1111 Vomiting without nausea: Secondary | ICD-10-CM

## 2018-05-10 DIAGNOSIS — R1110 Vomiting, unspecified: Secondary | ICD-10-CM | POA: Diagnosis not present

## 2018-05-10 DIAGNOSIS — Z87891 Personal history of nicotine dependence: Secondary | ICD-10-CM | POA: Insufficient documentation

## 2018-05-10 DIAGNOSIS — R1084 Generalized abdominal pain: Secondary | ICD-10-CM | POA: Diagnosis not present

## 2018-05-10 DIAGNOSIS — R197 Diarrhea, unspecified: Secondary | ICD-10-CM | POA: Diagnosis not present

## 2018-05-10 DIAGNOSIS — Z79899 Other long term (current) drug therapy: Secondary | ICD-10-CM | POA: Diagnosis not present

## 2018-05-10 LAB — CBC
HEMATOCRIT: 43.4 % (ref 36.0–46.0)
HEMOGLOBIN: 14.4 g/dL (ref 12.0–15.0)
MCH: 28.7 pg (ref 26.0–34.0)
MCHC: 33.2 g/dL (ref 30.0–36.0)
MCV: 86.6 fL (ref 80.0–100.0)
Platelets: 266 10*3/uL (ref 150–400)
RBC: 5.01 MIL/uL (ref 3.87–5.11)
RDW: 12.4 % (ref 11.5–15.5)
WBC: 10.3 10*3/uL (ref 4.0–10.5)
nRBC: 0.2 % (ref 0.0–0.2)

## 2018-05-10 LAB — I-STAT BETA HCG BLOOD, ED (MC, WL, AP ONLY): I-stat hCG, quantitative: <5 m[IU]/mL (ref <5)

## 2018-05-10 LAB — COMPREHENSIVE METABOLIC PANEL
ALT: 26 U/L (ref 0–44)
ANION GAP: 17 — AB (ref 5–15)
AST: 33 U/L (ref 15–41)
Albumin: 4.4 g/dL (ref 3.5–5.0)
Alkaline Phosphatase: 52 U/L (ref 38–126)
BILIRUBIN TOTAL: 0.7 mg/dL (ref 0.3–1.2)
BUN: 6 mg/dL (ref 6–20)
CHLORIDE: 104 mmol/L (ref 98–111)
CO2: 19 mmol/L — ABNORMAL LOW (ref 22–32)
Calcium: 9.6 mg/dL (ref 8.9–10.3)
Creatinine, Ser: 0.81 mg/dL (ref 0.44–1.00)
GFR calc Af Amer: >60 mL/min (ref >60)
GFR calc non Af Amer: >60 mL/min (ref >60)
Glucose, Bld: 120 mg/dL — ABNORMAL HIGH (ref 70–99)
POTASSIUM: 3.1 mmol/L — AB (ref 3.5–5.1)
SODIUM: 140 mmol/L (ref 135–145)
TOTAL PROTEIN: 7.9 g/dL (ref 6.5–8.1)

## 2018-05-10 LAB — I-STAT TROPONIN, ED: TROPONIN I, POC: 0 ng/mL (ref 0.00–0.08)

## 2018-05-10 LAB — LIPASE, BLOOD: LIPASE: 17 U/L (ref 11–51)

## 2018-05-10 MED ORDER — ONDANSETRON HCL 4 MG/2ML IJ SOLN
4.0000 mg | Freq: Once | INTRAMUSCULAR | Status: AC | PRN
  Administered 2018-05-10: 4 mg via INTRAVENOUS
  Filled 2018-05-10: qty 2

## 2018-05-10 MED ORDER — SODIUM CHLORIDE 0.9 % IV BOLUS
1000.0000 mL | Freq: Once | INTRAVENOUS | Status: AC
  Administered 2018-05-10: 1000 mL via INTRAVENOUS

## 2018-05-10 MED ORDER — FAMOTIDINE IN NACL 20-0.9 MG/50ML-% IV SOLN
20.0000 mg | Freq: Once | INTRAVENOUS | Status: AC
  Administered 2018-05-10: 20 mg via INTRAVENOUS
  Filled 2018-05-10: qty 50

## 2018-05-10 MED ORDER — PROCHLORPERAZINE EDISYLATE 10 MG/2ML IJ SOLN
10.0000 mg | Freq: Once | INTRAMUSCULAR | Status: AC
  Administered 2018-05-10: 10 mg via INTRAVENOUS
  Filled 2018-05-10: qty 2

## 2018-05-10 MED ORDER — SUCRALFATE 1 G PO TABS: 1.0000 g | ORAL_TABLET | Freq: Three times a day (TID) | ORAL | 0 refills | Status: DC

## 2018-05-10 MED ORDER — SODIUM CHLORIDE 0.9% FLUSH: 3.0000 mL | Freq: Once | INTRAVENOUS | Status: DC

## 2018-05-10 NOTE — ED Provider Notes (Signed)
[facility] EMERGENCY DEPARTMENT Provider Note   CSN: [id number] Arrival date & time: 05/10/18  0603     History   Chief Complaint Chief Complaint  Patient presents with  . Nausea  . Abdominal Pain    HPI Angela Dixon is a 33 y.o. female.  The history is provided by the patient.  Emesis  Severity:  Mild Duration:  2 days Timing:  Intermittent Quality:  Stomach contents Progression:  Worsening Chronicity:  New Recent urination:  Normal Relieved by:  Nothing Associated symptoms: abdominal pain, diarrhea and myalgias   Associated symptoms: no arthralgias, no chills, no cough, no fever and no sore throat   Risk factors: alcohol use and suspect food intake     Past Medical History:  Diagnosis Date  . Abnormal Pap smear   . Acid reflux   . Anemia   . Chlamydia 2006  . Fracture   . Hypercholesterolemia   . Vitamin D deficiency     Patient Active Problem List   Diagnosis Date Noted  . GERD (gastroesophageal reflux disease) 01/09/2017  . Medication management 01/31/2015  . Depression 02/08/2014  . Generalized anxiety disorder 02/08/2014  . Vitamin D deficiency     Past Surgical History:  Procedure Laterality Date  . COLPOSCOPY  2006  . WISDOM TOOTH EXTRACTION       OB History    Gravida  2   Para  1   Term  1   Preterm      AB      Living  1     SAB      TAB      Ectopic      Multiple      Live Births  1            Home Medications    Prior to Admission medications   Medication Sig Start Date End Date Taking? Authorizing Provider  albuterol (PROVENTIL HFA;VENTOLIN HFA) 108 (90 Base) MCG/ACT inhaler Inhale 2 puffs into the lungs every 4 hours as needed for wheezing or shortness of breath. Patient taking differently: Inhale 2 puffs into the lungs every 4 (four) hours as needed for wheezing or shortness of breath.  01/16/17  Yes Unk Pinto, MD  azelastine (ASTELIN) 0.1 % nasal spray SPRAY 1 SPRAY INTO BOTH  NOSTRILS TWICE DAILY Patient taking differently: Place 1 spray into both nostrils 2 (two) times daily.  03/01/18  Yes Unk Pinto, MD  cetirizine (ZYRTEC) 10 MG tablet Take 10 mg by mouth daily.   Yes [provider]  Cholecalciferol 4000 UNITS CAPS Take 1 capsule by mouth daily.   Yes [provider]  Flaxseed, Linseed, (FLAX SEED OIL) 1000 MG CAPS Take by mouth daily.   Yes [provider]  ibuprofen (ADVIL,MOTRIN) 200 MG tablet Take 400 mg by mouth every 6 (six) hours as needed for moderate pain.   Yes [provider]  Magnesium 500 MG CAPS Take by mouth daily.   Yes [provider]  Multiple Vitamin (MULTIVITAMIN WITH MINERALS) TABS tablet Take 1 tablet by mouth daily.   Yes [provider]  omeprazole (PRILOSEC) 20 MG capsule Take 1 capsule daily for Acid Reflyx & Indigestion 03/29/18  Yes Unk Pinto, MD  Probiotic Product (PROBIOTIC DAILY PO) Take by mouth.   Yes [provider]  promethazine (PHENERGAN) 25 MG tablet Take 1 tablet (25 mg total) by mouth every 6 (six) hours as needed for nausea or vomiting. 12/29/17  Yes  Hayden Rasmussen, MD  ranitidine (ZANTAC) 300 MG tablet Take 1/2-1 tab twice daily for acid reflux. 10/30/17  Yes Liane Comber, NP  venlafaxine XR (EFFEXOR XR) 37.5 MG 24 hr capsule Take 1 capsule (37.5 mg total) by mouth 2 (two) times daily. 04/21/18 04/21/19 Yes [name], PA-C  citalopram (CELEXA) 20 MG tablet Take 1 tablet (20 mg total) by mouth daily. Patient not taking: Reported on 05/10/2018 04/02/18 04/02/19  [name], PA-C  methocarbamol (ROBAXIN-750) 750 MG tablet Take 1 tablet (750 mg total) by mouth every 8 (eight) hours as needed for muscle spasms. Patient not taking: Reported on 05/10/2018 09/19/17   Liane Comber, NP  sucralfate (CARAFATE) 1 g tablet Take 1 tablet (1 g total) by mouth 4 (four) times daily -  with meals and at bedtime for 14 days. 05/10/18 05/24/18  Lennice Sites, DO     Family History Family History  Problem Relation Age of Onset  . Hyperlipidemia Mother   . Asthma Sister   . Hypertension Maternal Grandmother   . Diabetes Maternal Grandmother   . Heart disease Maternal Grandfather   . Cancer Paternal Grandmother        throat  . Cancer Paternal Grandfather        lung    Social History Social History   Tobacco Use  . Smoking status: Former Smoker    Packs/day: 1.00    Types: Cigarettes    Last attempt to quit: 07/09/2006    Years since quitting: 11.8  . Smokeless tobacco: Never Used  Substance Use Topics  . Alcohol use: No  . Drug use: No     Allergies   Neomycin   Review of Systems Review of Systems  Constitutional: Negative for chills and fever.  HENT: Negative for ear pain and sore throat.   Eyes: Negative for pain and visual disturbance.  Respiratory: Negative for cough and shortness of breath.   Cardiovascular: Negative for chest pain and palpitations.  Gastrointestinal: Positive for abdominal pain, diarrhea and vomiting.  Genitourinary: Negative for difficulty urinating, dyspareunia, dysuria, enuresis, flank pain, frequency, genital sores, hematuria, menstrual problem, urgency, vaginal bleeding and vaginal discharge.  Musculoskeletal: Positive for myalgias. Negative for arthralgias and back pain.  Skin: Negative for color change and rash.  Neurological: Negative for seizures and syncope.  All other systems reviewed and are negative.    Physical Exam Updated Vital Signs  ED Triage Vitals  Enc Vitals Group     BP 05/10/18 0609 130/80     Pulse Rate 05/10/18 0609 71     Resp 05/10/18 0609 (!) 22     Temp 05/10/18 0609 98 F (36.7 C)     Temp Source 05/10/18 0609 Oral     SpO2 05/10/18 0609 100 %     Weight 05/10/18 0623 202 lb (91.6 kg)     Height 05/10/18 0623 5\' 4"  (1.626 m)     Head Circumference --      Peak Flow --      Pain Score 05/10/18 0623 4     Pain Loc --      Pain Edu? --      Excl. in [place]? --       Physical Exam Vitals signs and nursing note reviewed.  Constitutional:      General: She is not in acute distress.    Appearance: She is well-developed. She is not ill-appearing.  HENT:     Head: Normocephalic and atraumatic.     Mouth/Throat:  Mouth: Mucous membranes are moist.  Eyes:     Extraocular Movements: Extraocular movements intact.     Conjunctiva/sclera: Conjunctivae normal.     Pupils: Pupils are equal, round, and reactive to light.  Neck:     Musculoskeletal: Neck supple.  Cardiovascular:     Rate and Rhythm: Normal rate and regular rhythm.     Heart sounds: Normal heart sounds. No murmur.  Pulmonary:     Effort: Pulmonary effort is normal. No respiratory distress.     Breath sounds: Normal breath sounds.  Abdominal:     General: Abdomen is flat. Bowel sounds are normal.     Palpations: Abdomen is soft.     Tenderness: There is generalized abdominal tenderness. There is no right CVA tenderness, left CVA tenderness, guarding or rebound. Negative signs include Murphy's sign and Rovsing's sign.  Skin:    General: Skin is warm and dry.     Capillary Refill: Capillary refill takes less than 2 seconds.  Neurological:     Mental Status: She is alert.      ED Treatments / Results  Labs (all labs ordered are listed, but only abnormal results are displayed) Labs Reviewed  COMPREHENSIVE METABOLIC PANEL - Abnormal; Notable for the following components:      Result Value   Potassium 3.1 (*)    CO2 19 (*)    Glucose, Bld 120 (*)    Anion gap 17 (*)    All other components within normal limits  LIPASE, BLOOD  CBC  I-STAT BETA HCG BLOOD, ED ([facility], [facility], [facility] ONLY)  I-STAT TROPONIN, ED  I-STAT BETA HCG BLOOD, ED ([facility], [facility], [facility] ONLY)    EKG EKG Interpretation  Date/Time:  Sunday May 10 2018 69:62:95 EST Ventricular Rate:  63 PR Interval:    QRS Duration: 102 QT Interval:  459 QTC Calculation: 470 R Axis:   86 Text Interpretation:  Sinus rhythm Confirmed  by Randal Buba, April ([id number]) on 05/10/2018 6:45:15 AM Also confirmed by Randal Buba, April ([id number]), editor Philomena Doheny [phone])  on 05/10/2018 8:54:02 AM   Radiology Dg Chest 2 View  Result Date: 05/10/2018 CLINICAL DATA:  Chest pain EXAM: CHEST - 2 VIEW COMPARISON:  None. FINDINGS: Lungs are clear.  No pleural effusion or pneumothorax. The heart is normal in size. Visualized osseous structures are within normal limits. IMPRESSION: Normal chest radiographs. Electronically Signed   By: [name] Hy M.D.   On: 05/10/2018 07:00   Dg Abdomen 1 View  Result Date: 05/10/2018 CLINICAL DATA:  Nausea, vomiting, diarrhea. EXAM: ABDOMEN - 1 VIEW COMPARISON:  None. FINDINGS: The bowel gas pattern is normal. Intrauterine device is noted. No radio-opaque calculi or other significant radiographic abnormality are seen. IMPRESSION: No evidence of bowel obstruction or ileus. Electronically Signed   By: Marijo Conception, M.D.   On: 05/10/2018 07:45    Procedures Procedures (including critical care time)  Medications Ordered in ED Medications  sodium chloride flush (NS) 0.9 % injection 3 mL (3 mLs Intravenous Not Given 05/10/18 0711)  ondansetron (ZOFRAN) injection 4 mg (4 mg Intravenous Given 05/10/18 2440)  sodium chloride 0.9 % bolus 1,000 mL (0 mLs Intravenous Stopped 05/10/18 0853)  prochlorperazine (COMPAZINE) injection 10 mg (10 mg Intravenous Given 05/10/18 0716)  famotidine (PEPCID) IVPB 20 mg premix (0 mg Intravenous Stopped 05/10/18 0853)     Initial Impression / Assessment and Plan / ED Course  I have reviewed the triage vital signs and the nursing notes.  Pertinent labs & imaging  results that were available during my care of the patient were reviewed by me and considered in my medical decision making (see chart for details).     Nykole Matos is a 33 year old female with history of acid reflux who presents to the ED with abdominal pain, nausea, vomiting, diarrhea.  Symptoms began after binge drinking of  alcohol.  Has history of the same.  Has been using Phenergan suppositories at home without much relief.  Patient with normal vitals, no fever.  Patient has had some abdominal cramping and diarrhea as well.  Vomiting has been nonbilious and nonbloody.  Diarrhea has been mostly watery.  No melena or hematochezia.  Patient has some mild diffuse abdominal pain on exam but no signs of peritonitis.  Overall nonfocal abdominal exam.  Patient denies any urinary symptoms or GU symptoms.  Pregnancy test was negative in triage.  Overall she appears well.  Suspect likely gastritis versus viral process.  Will evaluate with lab work.  No significant leukocytosis, anemia, electrolyte abnormality.  No kidney injury.  Gallbladder and liver enzymes within normal limits and doubt hepatobiliary process.  Lipase normal and doubt pancreatitis.  Chest x-ray and troponin were also done in triage that show no acute findings.  Doubt ACS.  No pneumonia, no pneumothorax, no pleural effusion.  KUB overall unremarkable.  Patient likely with gastritis.  She felt improved following IV fluids, IV Zofran, Compazine, Pepcid. Will re-fil GI meds and give carafate and GI f/u. Repeat abdominal exam and re-assuring, no concern for intra-abdominal process including appendicits. Educated about alcohol use and other dietary modifications for gastritis.  Discharged in ED in good condition and given return precautions.  Given information to follow-up with GI and primary care.  This chart was dictated using voice recognition software.  Despite best efforts to proofread,  errors can occur which can change the documentation meaning.    Final Clinical Impressions(s) / ED Diagnoses   Final diagnoses:  Vomiting without nausea, intractability of vomiting not specified, unspecified vomiting type    ED Discharge Orders         Ordered    sucralfate (CARAFATE) 1 g tablet  3 times daily with meals & bedtime     05/10/18 [place],  [place], DO 05/10/18 [phone]

## 2018-05-10 NOTE — ED Triage Notes (Signed)
Reports N/V/D since Friday morning.  Seen on Friday at [facility].  Given zofran and phenergan with no relief.  Using phenergan suppositories at home with no relief.  Also endorses pain in chest with breathing.

## 2018-05-10 NOTE — ED Notes (Signed)
Patient transported to X-ray 

## 2018-05-10 NOTE — ED Notes (Signed)
Patient verbalized understanding of dc instructions, vss, ambulatory upon discharge with nad.

## 2018-05-12 ENCOUNTER — Ambulatory Visit: Payer: BLUE CROSS/BLUE SHIELD | Admitting: Adult Health Nurse Practitioner

## 2018-05-12 ENCOUNTER — Encounter: Payer: Self-pay | Admitting: Adult Health Nurse Practitioner

## 2018-05-12 VITALS — BP 122/80 | HR 89 | Temp 97.9°F | Ht 64.0 in | Wt 204.2 lb

## 2018-05-12 DIAGNOSIS — K219 Gastro-esophageal reflux disease without esophagitis: Secondary | ICD-10-CM

## 2018-05-12 DIAGNOSIS — K589 Irritable bowel syndrome without diarrhea: Secondary | ICD-10-CM | POA: Insufficient documentation

## 2018-05-12 DIAGNOSIS — R197 Diarrhea, unspecified: Secondary | ICD-10-CM

## 2018-05-12 DIAGNOSIS — R112 Nausea with vomiting, unspecified: Secondary | ICD-10-CM

## 2018-05-12 NOTE — Progress Notes (Signed)
Assessment and Plan:  Angela Dixon was seen today for acute visit.  Diagnoses and all orders for this visit:  Gastroesophageal reflux disease, esophagitis presence not specified Continue Prilosec Discussion about medication compliance Discussed avoiding triggers, fried and greasy foods  Nausea and vomiting, intractability of vomiting not specified, unspecified vomiting type Improved, nausea remains. Discussed starting with bland diet, educational handout provided Has phenergan suppositories PRN Discussed this may improve after starting bland diet  Diarrhea, unspecified type Discussed at length dietary modifications and avoiding triggers Discussed compliance with Metamucil to help Discussed probiotic daily  Generalized anxiety disorder Reports doing well on medication Taking Effexor-XR   Discussed symptoms likely from ETOH binge, existing GERD and diarrhea increased associated baseline?  With generalized symptoms with mild improvement, continue to monitor.  Call or return with new or worsening symptoms as discussed in appointment.  Consider referral to GI.  May contact via office phone [phone] or via [place].   Further disposition pending results of labs. Discussed med's effects and SE's.   Over 30 minutes of exam, counseling, chart review, and critical decision making was performed.   Future Appointments  Date Time Provider [place]  07/10/2018  9:00 AM [name], PA-C [facility] None  01/27/2019 10:00 AM [name], PA-C [facility] None    ------------------------------------------------------------------------------------------------------------------   HPI 33 y.o.female presents for evaluation of nausea, vomiting, and diarrhea.  Four days ago she went to urgent care and phenergan suppositories.  She watched her food intake the next day. She felt the again felt discomfort in upper shoulder upper back with nausea and began vomitting.  She reports that  the emesis does not provide relief of her symptoms.  She went to ER that night concerned for the shoulder and back/chest pains.  Cardiac etiology was ruled out, ekg, chest andd abdominal xray. Hypokalemia 3.1 found on labs but likely from emesis and diarrhea.  She was treated with Carafate and continued phenergan suppositories prn which she reports have not really helped. Today she reports she is not nauseated.  She has had water and Gatorade, low sugar, and able to hold this down.  She has not attempted food.  Her last bowel movement was last night that was watery.  She denies any melena or mucus in stool.  She reports that she had cramping with the watery stools but denies any abdominal pains today.  She reports as a baseline she battles with diarrhea.  She takes metamucil and a probiotic.  Reports she has not been doing well with her diet on being consistent with avoiding triggers, ie caffeine.    LMP: Mirena IUD Had spotting couple weeks ago   Past Medical History:  Diagnosis Date  . Abnormal Pap smear   . Acid reflux   . Anemia   . Chlamydia 2006  . Fracture   . Hypercholesterolemia   . Vitamin D deficiency      Allergies  Allergen Reactions  . Neomycin Rash    Current Outpatient Medications on File Prior to Visit  Medication Sig  . albuterol (PROVENTIL HFA;VENTOLIN HFA) 108 (90 Base) MCG/ACT inhaler Inhale 2 puffs into the lungs every 4 hours as needed for wheezing or shortness of breath. (Patient taking differently: Inhale 2 puffs into the lungs every 4 (four) hours as needed for wheezing or shortness of breath. )  . azelastine (ASTELIN) 0.1 % nasal spray SPRAY 1 SPRAY INTO BOTH NOSTRILS TWICE DAILY (Patient taking differently: Place 1 spray into both nostrils 2 (two) times daily. )  .  cetirizine (ZYRTEC) 10 MG tablet Take 10 mg by mouth daily.  . Cholecalciferol 4000 UNITS CAPS Take 1 capsule by mouth daily.  . citalopram (CELEXA) 20 MG tablet Take 1 tablet (20 mg total) by  mouth daily.  . Flaxseed, Linseed, (FLAX SEED OIL) 1000 MG CAPS Take by mouth daily.  Marland Kitchen ibuprofen (ADVIL,MOTRIN) 200 MG tablet Take 400 mg by mouth every 6 (six) hours as needed for moderate pain.  . Magnesium 500 MG CAPS Take by mouth daily.  . methocarbamol (ROBAXIN-750) 750 MG tablet Take 1 tablet (750 mg total) by mouth every 8 (eight) hours as needed for muscle spasms.  . Multiple Vitamin (MULTIVITAMIN WITH MINERALS) TABS tablet Take 1 tablet by mouth daily.  Marland Kitchen omeprazole (PRILOSEC) 20 MG capsule Take 1 capsule daily for Acid Reflyx & Indigestion  . Probiotic Product (PROBIOTIC DAILY PO) Take by mouth.  . promethazine (PHENERGAN) 25 MG tablet Take 1 tablet (25 mg total) by mouth every 6 (six) hours as needed for nausea or vomiting.  . sucralfate (CARAFATE) 1 g tablet Take 1 tablet (1 g total) by mouth 4 (four) times daily -  with meals and at bedtime for 14 days.  Marland Kitchen venlafaxine XR (EFFEXOR XR) 37.5 MG 24 hr capsule Take 1 capsule (37.5 mg total) by mouth 2 (two) times daily.  . ranitidine (ZANTAC) 300 MG tablet Take 1/2-1 tab twice daily for acid reflux. (Patient not taking: Reported on 05/12/2018)   No current facility-administered medications on file prior to visit.     ROS: Review of Systems  Constitutional: Negative for chills, diaphoresis, fever, malaise/fatigue and weight loss.  HENT: Negative for hearing loss.   Gastrointestinal: Positive for abdominal pain, diarrhea, heartburn, nausea and vomiting. Negative for blood in stool, constipation and melena.  Genitourinary: Negative for dysuria, flank pain, frequency, hematuria and urgency.  Musculoskeletal: Negative for back pain, falls, joint pain, myalgias and neck pain.  Skin: Negative for itching and rash.     Physical Exam:  BP 122/80   Pulse 89   Temp 97.9 F (36.6 C)   Ht 5\' 4"  (1.626 m)   Wt 204 lb 3.2 oz (92.6 kg)   SpO2 99%   BMI 35.05 kg/m   General Appearance: Well nourished, in no apparent distress. Eyes:  PERRLA, EOMs, conjunctiva no swelling or erythema Sinuses: No Frontal/maxillary tenderness ENT/Mouth: Ext aud canals clear, TMs without erythema, bulging. No erythema, swelling, or exudate on post pharynx.  Tonsils not swollen or erythematous. Hearing normal.  Neck: Supple, thyroid normal.  Respiratory: Respiratory effort normal, BS equal bilaterally without rales, rhonchi, wheezing or stridor.  Cardio: RRR with no MRGs. Brisk peripheral pulses without edema.  Abdomen: Soft obese, + BS hyperactive. Tenderness to light palpation to epigastric, tenderness to deep palpation full length of lower abdomen.  No guarding, rebound, hernias, masses. Lymphatics: Non tender without lymphadenopathy.  Musculoskeletal: Full ROM, 5/5 strength, normal gait.  Skin: Warm, dry without rashes, lesions, ecchymosis.  Psych: Awake and oriented X 3, normal affect, Insight and Judgment appropriate.     Garnet Sierras, NP 10:00 AM  [facility]

## 2018-05-12 NOTE — Patient Instructions (Addendum)
Continue Carafate and slowly introduce foods.  There is a list below of examples.  We discussed a referral to GI should you have any new or worsening symptoms  Avoid triggers to minimize your diarrhea.   Bland Diet A bland diet consists of foods that are often soft and do not have a lot of fat, fiber, or extra seasonings. Foods without fat, fiber, or seasoning are easier for the body to digest. They are also less likely to irritate your mouth, throat, stomach, and other parts of your digestive system. A bland diet is sometimes called a BRAT diet. What is my plan? Your health care provider or food and nutrition specialist (dietitian) may recommend specific changes to your diet to prevent symptoms or to treat your symptoms. These changes may include:  Eating small meals often.  Cooking food until it is soft enough to chew easily.  Chewing your food well.  Drinking fluids slowly.  Not eating foods that are very spicy, sour, or fatty.  Not eating citrus fruits, such as oranges and grapefruit. What do I need to know about this diet?  Eat a variety of foods from the bland diet food list.  Do not follow a bland diet longer than needed.  Ask your health care provider whether you should take vitamins or supplements. What foods can I eat? Grains  Hot cereals, such as cream of wheat. Rice. Bread, crackers, or tortillas made from refined white flour. Vegetables Canned or cooked vegetables. Mashed or boiled potatoes. Fruits  Bananas. Applesauce. Other types of cooked or canned fruit with the skin and seeds removed, such as canned peaches or pears. Meats and other proteins  Scrambled eggs. Creamy peanut butter or other nut butters. Lean, well-cooked meats, such as chicken or fish. Tofu. Soups or broths. Dairy Low-fat dairy products, such as milk, cottage cheese, or yogurt. Beverages  Water. Herbal tea. Apple juice. Fats and oils Mild salad dressings. Canola or olive oil. Sweets and  desserts Pudding. Custard. Fruit gelatin. Ice cream. The items listed above may not be a complete list of recommended foods and beverages. Contact a dietitian for more options. What foods are not recommended? Grains Whole grain breads and cereals. Vegetables Raw vegetables. Fruits Raw fruits, especially citrus, berries, or dried fruits. Dairy Whole fat dairy foods. Beverages Caffeinated drinks. Alcohol. Seasonings and condiments Strongly flavored seasonings or condiments. Hot sauce. Salsa. Other foods Spicy foods. Fried foods. Sour foods, such as pickled or fermented foods. Foods with high sugar content. Foods high in fiber. The items listed above may not be a complete list of foods and beverages to avoid. Contact a dietitian for more information. Summary  A bland diet consists of foods that are often soft and do not have a lot of fat, fiber, or extra seasonings.  Foods without fat, fiber, or seasoning are easier for the body to digest.  Check with your health care provider to see how long you should follow this diet plan. It is not meant to be followed for long periods. This information is not intended to replace advice given to you by your health care provider. Make sure you discuss any questions you have with your health care provider. Document Released: 07/17/2015 Document Revised: 04/23/2017 Document Reviewed: 04/23/2017 Elsevier Interactive Patient Education  2019 [place] Choices to Help Relieve Diarrhea, Adult When you have diarrhea, the foods you eat and your eating habits are very important. Choosing the right foods and drinks can help:  Relieve  diarrhea.  Replace lost fluids and nutrients.  Prevent dehydration. What general guidelines should I follow?  Relieving diarrhea  Choose foods with less than 2 g or .07 oz. of fiber per serving.  Limit fats to less than 8 tsp (38 g or 1.34 oz.) a day.  Avoid the following: ? Foods and beverages  sweetened with high-fructose corn syrup, honey, or sugar alcohols such as xylitol, sorbitol, and mannitol. ? Foods that contain a lot of fat or sugar. ? Fried, greasy, or spicy foods. ? High-fiber grains, breads, and cereals. ? Raw fruits and vegetables.  Eat foods that are rich in probiotics. These foods include dairy products such as yogurt and fermented milk products. They help increase healthy bacteria in the stomach and intestines (gastrointestinal tract, or GI tract).  If you have lactose intolerance, avoid dairy products. These may make your diarrhea worse.  Take medicine to help stop diarrhea (antidiarrheal medicine) only as told by your health care provider. Replacing nutrients  Eat small meals or snacks every 3-4 hours.  Eat bland foods, such as white rice, toast, or baked potato, until your diarrhea starts to get better. Gradually reintroduce nutrient-rich foods as tolerated or as told by your health care provider. This includes: ? Well-cooked protein foods. ? Peeled, seeded, and soft-cooked fruits and vegetables. ? Low-fat dairy products.  Take vitamin and mineral supplements as told by your health care provider. Preventing dehydration  Start by sipping water or a special solution to prevent dehydration (oral rehydration solution, ORS). Urine that is clear or pale yellow means that you are getting enough fluid.  Try to drink at least 8-10 cups of fluid each day to help replace lost fluids.  You may add other liquids in addition to water, such as clear juice or decaffeinated sports drinks, as tolerated or as told by your health care provider.  Avoid drinks with caffeine, such as coffee, tea, or soft drinks.  Avoid alcohol. What foods are recommended?     The items listed may not be a complete list. Talk with your health care provider about what dietary choices are best for you. Grains White rice. White, [place], or pita breads (fresh or toasted), including plain rolls,  buns, or bagels. White pasta. Saltine, soda, or graham crackers. Pretzels. Low-fiber cereal. Cooked cereals made with water (such as cornmeal, farina, or cream cereals). Plain muffins. Matzo. Melba toast. Zwieback. Vegetables Potatoes (without the skin). Most well-cooked and canned vegetables without skins or seeds. Tender lettuce. Fruits Apple sauce. Fruits canned in juice. Cooked apricots, cherries, grapefruit, peaches, pears, or plums. Fresh bananas and cantaloupe. Meats and other protein foods Baked or boiled chicken. Eggs. Tofu. Fish. Seafood. Smooth nut butters. Ground or well-cooked tender beef, ham, veal, lamb, pork, or poultry. Dairy Plain yogurt, kefir, and unsweetened liquid yogurt. Lactose-free milk, buttermilk, skim milk, or soy milk. Low-fat or nonfat hard cheese. Beverages Water. Low-calorie sports drinks. Fruit juices without pulp. Strained tomato and vegetable juices. Decaffeinated teas. Sugar-free beverages not sweetened with sugar alcohols. Oral rehydration solutions, if approved by your health care provider. Seasoning and other foods Bouillon, broth, or soups made from recommended foods. What foods are not recommended? The items listed may not be a complete list. Talk with your health care provider about what dietary choices are best for you. Grains Whole grain, whole wheat, bran, or rye breads, rolls, pastas, and crackers. Wild or brown rice. Whole grain or bran cereals. Barley. Oats and oatmeal. Corn tortillas or taco shells. Granola. Popcorn.  Vegetables Raw vegetables. Fried vegetables. Cabbage, broccoli, Brussels sprouts, artichokes, baked beans, beet greens, corn, kale, legumes, peas, sweet potatoes, and yams. Potato skins. Cooked spinach and cabbage. Fruits Dried fruit, including raisins and dates. Raw fruits. Stewed or dried prunes. Canned fruits with syrup. Meat and other protein foods Fried or fatty meats. Deli meats. Chunky nut butters. Nuts and seeds. Beans and  lentils. Berniece Salines. Hot dogs. Sausage. Dairy High-fat cheeses. Whole milk, chocolate milk, and beverages made with milk, such as milk shakes. Half-and-half. Cream. sour cream. Ice cream. Beverages Caffeinated beverages (such as coffee, tea, soda, or energy drinks). Alcoholic beverages. Fruit juices with pulp. Prune juice. Soft drinks sweetened with high-fructose corn syrup or sugar alcohols. High-calorie sports drinks. Fats and oils Butter. Cream sauces. Margarine. Salad oils. Plain salad dressings. Olives. Avocados. Mayonnaise. Sweets and desserts Sweet rolls, doughnuts, and sweet breads. Sugar-free desserts sweetened with sugar alcohols such as xylitol and sorbitol. Seasoning and other foods Honey. Hot sauce. Chili powder. Gravy. Cream-based or milk-based soups. Pancakes and waffles. Summary  When you have diarrhea, the foods you eat and your eating habits are very important.  Make sure you get at least 8-10 cups of fluid each day, or enough to keep your urine clear or pale yellow.  Eat bland foods and gradually reintroduce healthy, nutrient-rich foods as tolerated, or as told by your health care provider.  Avoid high-fiber, fried, greasy, or spicy foods. This information is not intended to replace advice given to you by your health care provider. Make sure you discuss any questions you have with your health care provider. Document Released: 06/15/2003 Document Revised: 03/22/2016 Document Reviewed: 03/22/2016 Elsevier Interactive Patient Education  2019 [employer].

## 2018-05-16 ENCOUNTER — Other Ambulatory Visit: Payer: Self-pay | Admitting: Physician Assistant

## 2018-06-18 DIAGNOSIS — Z713 Dietary counseling and surveillance: Secondary | ICD-10-CM | POA: Diagnosis not present

## 2018-07-09 DIAGNOSIS — F329 Major depressive disorder, single episode, unspecified: Secondary | ICD-10-CM

## 2018-07-09 DIAGNOSIS — J309 Allergic rhinitis, unspecified: Secondary | ICD-10-CM | POA: Diagnosis not present

## 2018-07-09 MED ORDER — VENLAFAXINE HCL ER 75 MG PO CP24: 75.0000 mg | ORAL_CAPSULE | Freq: Every day | ORAL | 1 refills | Status: DC

## 2018-07-09 NOTE — Telephone Encounter (Signed)
Assessment and Plan:   Generalized anxiety disorder -  Will send in 75mg  and she will take this with 37.5mg  and see how she does, may keep on this dose or increase to 75mg  BID  She will message with results in 2-4 weeks.   We will cancel the 04/03 appointment.   She will continue good hand hygiene, self quarantine and checking her temperature twice a day. She will notify our office if she gets any symptoms.     Future Appointments  Date Time Provider [place]  07/10/2018  9:00 AM [name], PA-C [facility] None  01/27/2019 10:00 AM [name], PA-C [facility] None    HPI 33 y.o.female call for follow up for depression and new medication start. In addition she has presumed positive with someone at work.   She states at work had persumed positive, she thinks it was not on her floor, she works for a [employer], community.   She has excessive worrying, dwells on things, will over react to things. On and off trouble sleeping.  She  effexor she is on 2 pills a day and states that it is helping, she would like to increase this to see if it helps.   She states her allergies are well controlled at this time.   Past Medical History:  Diagnosis Date  . Abnormal Pap smear   . Acid reflux   . Anemia   . Chlamydia 2006  . Fracture   . Hypercholesterolemia   . Vitamin D deficiency      Allergies  Allergen Reactions  . Neomycin Rash    Current Outpatient Medications on File Prior to Visit  Medication Sig  . albuterol (PROVENTIL HFA;VENTOLIN HFA) 108 (90 Base) MCG/ACT inhaler Inhale 2 puffs into the lungs every 4 hours as needed for wheezing or shortness of breath. (Patient taking differently: Inhale 2 puffs into the lungs every 4 (four) hours as needed for wheezing or shortness of breath. )  . azelastine (ASTELIN) 0.1 % nasal spray SPRAY 1 SPRAY INTO BOTH NOSTRILS TWICE DAILY (Patient taking differently: Place 1 spray into both nostrils 2 (two) times daily. )  . cetirizine  (ZYRTEC) 10 MG tablet Take 10 mg by mouth daily.  . Cholecalciferol 4000 UNITS CAPS Take 1 capsule by mouth daily.  . Flaxseed, Linseed, (FLAX SEED OIL) 1000 MG CAPS Take by mouth daily.  Marland Kitchen ibuprofen (ADVIL,MOTRIN) 200 MG tablet Take 400 mg by mouth every 6 (six) hours as needed for moderate pain.  . Magnesium 500 MG CAPS Take by mouth daily.  . methocarbamol (ROBAXIN-750) 750 MG tablet Take 1 tablet (750 mg total) by mouth every 8 (eight) hours as needed for muscle spasms.  . Multiple Vitamin (MULTIVITAMIN WITH MINERALS) TABS tablet Take 1 tablet by mouth daily.  Marland Kitchen omeprazole (PRILOSEC) 20 MG capsule Take 1 capsule daily for Acid Reflyx & Indigestion  . Probiotic Product (PROBIOTIC DAILY PO) Take by mouth.  . promethazine (PHENERGAN) 25 MG tablet Take 1 tablet (25 mg total) by mouth every 6 (six) hours as needed for nausea or vomiting.  . ranitidine (ZANTAC) 300 MG tablet Take 1/2-1 tab twice daily for acid reflux. (Patient not taking: Reported on 05/12/2018)  . sucralfate (CARAFATE) 1 g tablet Take 1 tablet (1 g total) by mouth 4 (four) times daily -  with meals and at bedtime for 14 days.  Marland Kitchen venlafaxine XR (EFFEXOR-XR) 37.5 MG 24 hr capsule TAKE 1 CAPSULE(37.5 MG) BY MOUTH TWICE DAILY   No current facility-administered  medications on file prior to visit.     ROS: all negative except above.   Physical Exam: General Appearance:Well sounding, in no apparent distress.  ENT/Mouth: No hoarseness, No cough for duration of visit.  Respiratory: completing full sentences without distress, without audible wheeze Neuro: Awake and oriented X 3,  Psych:  Insight and Judgment appropriate.

## 2018-07-10 ENCOUNTER — Ambulatory Visit: Payer: Self-pay | Admitting: Physician Assistant

## 2018-07-11 ENCOUNTER — Other Ambulatory Visit: Payer: Self-pay | Admitting: Adult Health

## 2018-07-11 MED ORDER — PROMETHAZINE HCL 25 MG RE SUPP: 25.0000 mg | Freq: Four times a day (QID) | RECTAL | 1 refills | Status: DC | PRN

## 2018-08-11 DIAGNOSIS — Z713 Dietary counseling and surveillance: Secondary | ICD-10-CM | POA: Diagnosis not present

## 2018-08-25 ENCOUNTER — Other Ambulatory Visit: Payer: Self-pay | Admitting: Physician Assistant

## 2018-08-25 MED ORDER — VENLAFAXINE HCL ER 150 MG PO CP24: 150.0000 mg | ORAL_CAPSULE | Freq: Every day | ORAL | 1 refills | Status: DC

## 2018-09-29 ENCOUNTER — Ambulatory Visit: Payer: BC Managed Care – PPO | Admitting: Adult Health

## 2018-09-29 ENCOUNTER — Ambulatory Visit: Payer: BLUE CROSS/BLUE SHIELD | Admitting: Adult Health

## 2018-09-29 ENCOUNTER — Encounter: Payer: Self-pay | Admitting: Adult Health

## 2018-09-29 ENCOUNTER — Other Ambulatory Visit: Payer: Self-pay

## 2018-09-29 VITALS — Temp 96.8°F | Wt 210.0 lb

## 2018-09-29 DIAGNOSIS — F4329 Adjustment disorder with other symptoms: Secondary | ICD-10-CM | POA: Insufficient documentation

## 2018-09-29 DIAGNOSIS — F1010 Alcohol abuse, uncomplicated: Secondary | ICD-10-CM | POA: Diagnosis not present

## 2018-09-29 DIAGNOSIS — R110 Nausea: Secondary | ICD-10-CM

## 2018-09-29 DIAGNOSIS — K219 Gastro-esophageal reflux disease without esophagitis: Secondary | ICD-10-CM

## 2018-09-29 MED ORDER — OMEPRAZOLE 20 MG PO CPDR: DELAYED_RELEASE_CAPSULE | ORAL | 3 refills | Status: DC

## 2018-09-29 NOTE — Progress Notes (Signed)
Virtual Visit via Telephone Note  I connected with Angela Dixon on 09/29/18 at 11:30 AM EDT by telephone and verified that I am speaking with the correct person using two identifiers.  Location: Patient: home  Provider: [place] office   I discussed the limitations, risks, security and privacy concerns of performing an evaluation and management service by telephone and the availability of in person appointments. I also discussed with the patient that there may be a patient responsible charge related to this service. The patient expressed understanding and agreed to proceed.   History of Present Illness:  Temp (!) 96.8 F (36 C)   Wt 210 lb (95.3 kg)   BMI 36.05 kg/m   33 y.o. female with hx of GERD and obesity calls to discuss nausea; she reports this episode began 4 am Sunday AM following a very stressful day moving out of her home, not eating, then drinking a lot of alcohol night night prior. She reports she had about 12 hours of intense nausea and was unable to keep down fluids before this resolved. She denies any other accompaniments other than questionably some bloating; denies pain, headache, dizziness, fever/chills, constipation or diarrhea. She reports she had another similar episode this AM that did resolve after a few hours. Denies current symptoms.  She is under a lot of stress, going through separation with husband and moved out  She admits she will commonly not eat much during the day, this combined with daily alcohol intake, reports averaging about 6 drinks recently. She knows she needs to cut back on this and she suspects lack of food and excess alcohol are main triggers of episodes.   She reports nausea is also chronic intermittent issue over last few years, will have flares every few months or so, has oral and suppository phenergan to help manage symptoms.   She is on effexor 150 mg daily for mood/anxiety and feels this is working well for her.   She is currently taking  omeprazole 20 mg daily in the AM for reflux symptoms which she feels have been well controlled without breakthrough reflux, hoarseness, cough.     Allergies:  Allergies  Allergen Reactions  . Neomycin Rash   Medical History:  has Vitamin D deficiency; Depression; Generalized anxiety disorder; Medication management; GERD (gastroesophageal reflux disease); Nausea & vomiting; and Diarrhea on their problem list. Surgical History:  She  has a past surgical history that includes Wisdom tooth extraction and Colposcopy (2006). Family History:  Herfamily history includes Asthma in her sister; Cancer in her paternal grandfather and paternal grandmother; Diabetes in her maternal grandmother; Heart disease in her maternal grandfather; Hyperlipidemia in her mother; Hypertension in her maternal grandmother. Social History:   reports that she quit smoking about 12 years ago. Her smoking use included cigarettes. She smoked 1.00 pack per day. She has never used smokeless tobacco. She reports current alcohol use of about 30.0 standard drinks of alcohol per week. She reports that she does not use drugs.    Observations/Objective:  General : Well sounding patient in no apparent distress HEENT: no hoarseness, no cough for duration of visit Lungs: speaks in complete sentences, no audible wheezing, no apparent distress Neurological: alert, oriented x 3 Psychiatric: pleasant, judgement appropriate    Assessment and Plan:  Theodosia was seen today for nausea.  Diagnoses and all orders for this visit:  Nausea ? Consequent of hypoglycemia/dehydration/excessive ETOH Possible element of poorly controlled GERD due to ETOH Will work on lifestyle, eating more consistently, strategies  for hydration discussed Will cut back on ETOH Increase omeprazole 20 mg to BID for 2 weeks then resume previous once daily  If recurrent episodes will have her present for labs if acutely having symptoms, consider abd [place] looking at  liver/gallbladder/pancreas Continue phenergan oral/supp PRN nausea  Excessive drinking of alcohol Discussed cutting back; she recognizes this as an issue and unhealthy habit and is confident she can taper back to <2 drinks per day Discussed when she does drink alcohol to ensure a full meal prior, hydrate well  Stress and adjustment reaction Stress management discussed; she feels current medication dose is sufficient. Monitor closely.   Gastroesophageal reflux disease, esophagitis presence not specified Reduce ETOH, increase omeprazole x 2 weeks then resume daily and evaluate progress -     omeprazole (PRILOSEC) 20 MG capsule; Take 1 capsule twice daily for 2 weeks then resume once daily for Acid Reflyx & Indigestion.   Follow Up Instructions:    I discussed the assessment and treatment plan with the patient. The patient was provided an opportunity to ask questions and all were answered. The patient agreed with the plan and demonstrated an understanding of the instructions.   The patient was advised to call back or seek an in-person evaluation if the symptoms worsen or if the condition fails to improve as anticipated.  I provided 15 minutes of non-face-to-face time during this encounter.  Future Appointments  Date Time Provider [place]  01/27/2019 10:00 AM [name], PA-C [id number] None     [name], NP

## 2018-10-08 ENCOUNTER — Telehealth: Payer: Self-pay

## 2018-10-08 ENCOUNTER — Other Ambulatory Visit: Payer: Self-pay | Admitting: Adult Health

## 2018-10-08 DIAGNOSIS — R112 Nausea with vomiting, unspecified: Secondary | ICD-10-CM

## 2018-10-08 MED ORDER — PROMETHAZINE HCL 25 MG RE SUPP: 25.0000 mg | Freq: Four times a day (QID) | RECTAL | 1 refills | Status: DC | PRN

## 2018-10-08 NOTE — Telephone Encounter (Signed)
Patient states that she started vomiting during the night and cannot keep anything down. States that when she saw you, she was told to call if this happened and an ultrasound would be ordered.

## 2018-10-08 NOTE — Telephone Encounter (Signed)
34 y.o. female with has Vitamin D deficiency; Depression; Generalized anxiety disorder; Medication management; GERD (gastroesophageal reflux disease); Nausea & vomiting; Diarrhea; Excessive drinking of alcohol; and Stress and adjustment reaction on their problem list.  Returned call; nausea/vomiting has since resolved with phenergan suppository. Episode was again preceded by an episode of binge drinking. She is working on cutting down. Phenergan refilled as this is working well for her. Denies fever/chills, abd pain, is passing gas and having BMs. After discussion she would like to proceed with [place] previously discussed. Ordered to be scheduled next week. ER precautions given is having any severe AB pain, unable to hold down food/water, blood in stool or vomit, chest pain, shortness of breath, or any worsening symptoms. Dr. Melford Aase aware and patient also advised may call or message if concerns are non-emergent.

## 2018-10-13 DIAGNOSIS — Z713 Dietary counseling and surveillance: Secondary | ICD-10-CM | POA: Diagnosis not present

## 2018-10-21 ENCOUNTER — Ambulatory Visit
Admission: RE | Admit: 2018-10-21 | Discharge: 2018-10-21 | Disposition: A | Discharge status: Home / Self Care | Payer: BC Managed Care – PPO | Source: Ambulatory Visit | Attending: Adult Health | Admitting: Adult Health

## 2018-10-21 DIAGNOSIS — K760 Fatty (change of) liver, not elsewhere classified: Secondary | ICD-10-CM | POA: Diagnosis not present

## 2018-10-21 DIAGNOSIS — R112 Nausea with vomiting, unspecified: Secondary | ICD-10-CM

## 2018-10-22 ENCOUNTER — Other Ambulatory Visit: Payer: Self-pay | Admitting: Adult Health

## 2018-10-22 MED ORDER — HYOSCYAMINE SULFATE 0.125 MG SL SUBL: SUBLINGUAL_TABLET | SUBLINGUAL | 0 refills | Status: DC

## 2018-12-02 DIAGNOSIS — Z713 Dietary counseling and surveillance: Secondary | ICD-10-CM | POA: Diagnosis not present

## 2019-01-06 DIAGNOSIS — Z713 Dietary counseling and surveillance: Secondary | ICD-10-CM | POA: Diagnosis not present

## 2019-01-22 NOTE — Progress Notes (Signed)
Complete Physical  Assessment and Plan: Encounter for general adult medical examination with abnormal findings 1 year  Recurrent major depressive disorder, in partial remission ([place]) -     naltrexone (DEPADE) 50 MG tablet; Take 1 tablet (50 mg total) by mouth daily. Continue effexor, declines increase in medication at this time, will try to stop ETOH and see if that helps - as for help, do what she can No SI/HI   Generalized anxiety disorder Continue effexor, declines increase in medication at this time, will try to stop ETOH and see if that helps  Vitamin D deficiency -     VITAMIN D 25 Hydroxy (Vit-D Deficiency, Fractures)  Medication management -     CBC with Differential/Platelet -     COMPLETE METABOLIC PANEL WITH GFR -     TSH -     Magnesium  Gastroesophageal reflux disease, unspecified whether esophagitis present Monitor, normal [place], continue PPI and cut back on ETOH, if continues may consider a HIDA  Nausea and vomiting, intractability of vomiting not specified, unspecified vomiting type Monitor, normal [place], continue PPI and cut back on ETOH, if continues may consider a HIDA  Excessive drinking of alcohol -     naltrexone (DEPADE) 50 MG tablet; Take 1 tablet (50 mg total) by mouth daily. - continue to cut back, will try nalterxone to see if this helps curb alcohol and help with weight loss  Diarrhea, unspecified type Improved, continue to monitor  Screening for diabetes mellitus -     Hemoglobin A1c  Screening for hyperlipidemia -     Lipid panel  Screening for hematuria or proteinuria -     Urinalysis, Routine w reflex microscopic -     Microalbumin / creatinine urine ratio  Screening for deficiency anemia -     Iron,Total/Total Iron Binding Cap -     Vitamin B12  Screen for STD (sexually transmitted disease) -     C. trachomatis/N. gonorrhoeae RNA -     RPR -     HIV Antibody (routine testing w rflx) -     HSV(herpes simplex vrs) 1+2 ab-IgG - no  symptoms, used protection but new sexual partner.     Discussed med's effects and SE's. Screening labs and tests as requested with regular follow-up as recommended. Over 40 minutes of exam, counseling, chart review, and complex, high level critical decision making was performed this visit.   HPI  33 y.o. female  presents for a complete physical and follow up for has Vitamin D deficiency; Depression; Generalized anxiety disorder; Medication management; GERD (gastroesophageal reflux disease); Nausea & vomiting; Diarrhea; Excessive drinking of alcohol; and Stress and adjustment reaction on their problem list..  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 She has a kindergarten son, there is a lot going on, youngest son is at daycare.   She was seen for gastritis, she is separated from her husband, has been drinking more but cut back and has helped her stomach. She moved out in June, husband bought her part of the house. Will not drink every day, will have 2-3 truly very rare up to 5 truly.   She is very overwhelmed, has decreased motivation, feels more depressed. States her anxiety has improved, she is on effexor on 150mg .   BMI is Body mass index is 36.78 kg/m., she is working on diet and exercise. Wt Readings from Last 3 Encounters:  01/27/19 221 lb (100.2 kg)  09/29/18 210 lb (95.3 kg)  05/12/18 204 lb 3.2 oz (92.6 kg)   Her blood pressure has been controlled at home, today their BP is BP: 118/74 She does not workout. She denies chest pain, shortness of breath, dizziness.   She is not on cholesterol medication and denies myalgias. Her cholesterol is not at goal. The cholesterol last visit was:   Lab Results  Component Value Date   CHOL 208 (H) 01/19/2018   HDL 63 01/19/2018   LDLCALC 116 (H) 01/19/2018   TRIG 172 (H) 01/19/2018   CHOLHDL 3.3 01/19/2018   Last A1C in the office was:  Lab Results   Component Value Date   HGBA1C 5.0 01/19/2018   Patient is on Vitamin D supplement, 4000 a day.   Lab Results  Component Value Date   VD25OH 32 01/19/2018     BMI is Body mass index is 36.78 kg/m., she is working on diet and exercise. Wt Readings from Last 3 Encounters:  01/27/19 221 lb (100.2 kg)  09/29/18 210 lb (95.3 kg)  05/12/18 204 lb 3.2 oz (92.6 kg)    Current Medications:  Current Outpatient Medications on File Prior to Visit  Medication Sig Dispense Refill  . albuterol (PROVENTIL HFA;VENTOLIN HFA) 108 (90 Base) MCG/ACT inhaler Inhale 2 puffs into the lungs every 4 hours as needed for wheezing or shortness of breath. (Patient taking differently: Inhale 2 puffs into the lungs every 4 (four) hours as needed for wheezing or shortness of breath. ) 3 Inhaler 0  . azelastine (ASTELIN) 0.1 % nasal spray SPRAY 1 SPRAY INTO BOTH NOSTRILS TWICE DAILY (Patient taking differently: Place 1 spray into both nostrils 2 (two) times daily. ) 90 mL 3  . cetirizine (ZYRTEC) 10 MG tablet Take 10 mg by mouth daily.    . Cholecalciferol 4000 UNITS CAPS Take 1 capsule by mouth daily.    . Flaxseed, Linseed, (FLAX SEED OIL) 1000 MG CAPS Take by mouth daily.    . hyoscyamine (LEVSIN SL) 0.125 MG SL tablet Take 1 to 2 tablets 3 to 4 x day if needed for Nausea, vomiting, cramping or diarrhea 90 tablet 0  . ibuprofen (ADVIL,MOTRIN) 200 MG tablet Take 400 mg by mouth every 6 (six) hours as needed for moderate pain.    . Magnesium 500 MG CAPS Take by mouth daily.    . methocarbamol (ROBAXIN-750) 750 MG tablet Take 1 tablet (750 mg total) by mouth every 8 (eight) hours as needed for muscle spasms. 60 tablet 1  . Multiple Vitamin (MULTIVITAMIN WITH MINERALS) TABS tablet Take 1 tablet by mouth daily.    Marland Kitchen omeprazole (PRILOSEC) 20 MG capsule Take 1 capsule Daily for   Indigestion & Acid Reflux 90 capsule 0  . Probiotic Product (PROBIOTIC DAILY PO) Take by mouth.    . promethazine (PHENERGAN) 25 MG suppository  Place 1 suppository (25 mg total) rectally every 6 (six) hours as needed for nausea or vomiting. 12 each 1  . promethazine (PHENERGAN) 25 MG tablet Take 1 tablet (25 mg total) by mouth every 6 (six) hours as needed for nausea or vomiting. 30 tablet 0  . venlafaxine XR (EFFEXOR-XR) 150 MG 24 hr capsule Take 1 capsule (150 mg total) by mouth daily with breakfast. 90 capsule 1  . sucralfate (CARAFATE) 1 g tablet Take 1 tablet (1 g total) by mouth 4 (four) times daily -  with meals and at bedtime for 14 days. 56 tablet 0  No current facility-administered medications on file prior to visit.    Allergies:  Allergies  Allergen Reactions  . Neomycin Rash   Medical History:  She has Vitamin D deficiency; Depression; Generalized anxiety disorder; Medication management; GERD (gastroesophageal reflux disease); Nausea & vomiting; Diarrhea; Excessive drinking of alcohol; and Stress and adjustment reaction on their problem list.   Health Maintenance:   Immunization History  Administered Date(s) Administered  . Influenza Inj Mdck Quad With Preservative 01/09/2017  . Influenza,inj,Quad PF,6+ Mos 11/28/2018  . PPD Test 12/09/2013  . Tdap 11/19/2010, 02/15/2013, 09/15/2014   Tetanus: 2012 Pneumovax: Prevnar 13:  Flu vaccine: August 2020 Zostavax:  Pap: Sept 25 2019 HAS IUD- need to replace next year MGM: N/A AB [place] 10/2018 normal gallbladder, fatty liver DEXA: Colonoscopy: N/A EGD: N/A CXR 2015  Patient Care Team: Unk Pinto, MD as PCP - General (Internal Medicine) Dyke Maes, [place] (Optometry) Murrell Redden Earlyne Iba, MD as Consulting Physician (Obstetrics and Gynecology)  Surgical History:  She has a past surgical history that includes Wisdom tooth extraction and Colposcopy (2006). Family History:  Herfamily history includes Asthma in her sister; Cancer in her paternal grandfather and paternal grandmother; Diabetes in her maternal grandmother; Heart disease in her maternal grandfather;  Hyperlipidemia in her mother; Hypertension in her maternal grandmother. Social History:  She reports that she quit smoking about 12 years ago. Her smoking use included cigarettes. She smoked 1.00 pack per day. She has never used smokeless tobacco. She reports current alcohol use of about 30.0 standard drinks of alcohol per week. She reports that she does not use drugs.  Review of Systems: Review of Systems  Constitutional: Negative.   HENT: Negative.   Eyes: Negative.   Respiratory: Negative.   Cardiovascular: Negative.   Gastrointestinal: Positive for heartburn. Negative for abdominal pain, blood in stool, constipation, diarrhea, melena, nausea and vomiting.  Genitourinary: Negative.   Musculoskeletal: Negative.   Skin: Negative.   Psychiatric/Behavioral: Positive for depression. Negative for hallucinations and suicidal ideas.    Physical Exam: Estimated body mass index is 36.78 kg/m as calculated from the following:   Height as of this encounter: 5\' 5"  (1.651 m).   Weight as of this encounter: 221 lb (100.2 kg). BP 118/74   Pulse 84   Temp 97.7 F (36.5 C)   Ht 5\' 5"  (1.651 m)   Wt 221 lb (100.2 kg)   SpO2 99%   BMI 36.78 kg/m  General Appearance: Well nourished, in no apparent distress.  Eyes: PERRLA, EOMs, conjunctiva no swelling or erythema, normal fundi and vessels.  Sinuses: No Frontal/maxillary tenderness  ENT/Mouth: Ext aud canals clear, normal light reflex with TMs without erythema, bulging. Good dentition. No erythema, swelling, or exudate on post pharynx. Tonsils not swollen or erythematous. Hearing normal.  Neck: Supple, thyroid normal. No bruits  Respiratory: Respiratory effort normal, BS equal bilaterally without rales, rhonchi, wheezing or stridor.  Cardio: RRR without murmurs, rubs or gallops. Brisk peripheral pulses without edema.  Chest: symmetric, with normal excursions and percussion.  Breasts: Symmetric, without lumps, nipple discharge, retractions.   Abdomen: Soft, nontender, no guarding, rebound, hernias, masses, or organomegaly.  Lymphatics: Non tender without lymphadenopathy.  Genitourinary:  Musculoskeletal: Full ROM all peripheral extremities,5/5 strength, and normal gait.  Skin: Warm, dry without rashes, lesions, ecchymosis. Neuro: Cranial nerves intact, reflexes equal bilaterally. Normal muscle tone, no cerebellar symptoms. Sensation intact.  Psych: Awake and oriented X 3, normal affect, Insight and Judgment appropriate.    [name] 10:25 AM Lady [facility]  [facility]  

## 2019-01-25 ENCOUNTER — Other Ambulatory Visit: Payer: Self-pay | Admitting: Internal Medicine

## 2019-01-25 DIAGNOSIS — K219 Gastro-esophageal reflux disease without esophagitis: Secondary | ICD-10-CM

## 2019-01-25 MED ORDER — OMEPRAZOLE 20 MG PO CPDR: DELAYED_RELEASE_CAPSULE | ORAL | 0 refills | Status: DC

## 2019-01-27 ENCOUNTER — Other Ambulatory Visit: Payer: Self-pay

## 2019-01-27 ENCOUNTER — Encounter: Payer: Self-pay | Admitting: Physician Assistant

## 2019-01-27 ENCOUNTER — Ambulatory Visit: Payer: BC Managed Care – PPO | Admitting: Physician Assistant

## 2019-01-27 ENCOUNTER — Other Ambulatory Visit: Payer: Self-pay | Admitting: Internal Medicine

## 2019-01-27 VITALS — BP 118/74 | HR 84 | Temp 97.7°F | Ht 65.0 in | Wt 221.0 lb

## 2019-01-27 DIAGNOSIS — R112 Nausea with vomiting, unspecified: Secondary | ICD-10-CM

## 2019-01-27 DIAGNOSIS — R197 Diarrhea, unspecified: Secondary | ICD-10-CM

## 2019-01-27 DIAGNOSIS — F1010 Alcohol abuse, uncomplicated: Secondary | ICD-10-CM

## 2019-01-27 DIAGNOSIS — K219 Gastro-esophageal reflux disease without esophagitis: Secondary | ICD-10-CM

## 2019-01-27 DIAGNOSIS — Z1389 Encounter for screening for other disorder: Secondary | ICD-10-CM

## 2019-01-27 DIAGNOSIS — Z113 Encounter for screening for infections with a predominantly sexual mode of transmission: Secondary | ICD-10-CM | POA: Diagnosis not present

## 2019-01-27 DIAGNOSIS — F3341 Major depressive disorder, recurrent, in partial remission: Secondary | ICD-10-CM

## 2019-01-27 DIAGNOSIS — E559 Vitamin D deficiency, unspecified: Secondary | ICD-10-CM

## 2019-01-27 DIAGNOSIS — Z0001 Encounter for general adult medical examination with abnormal findings: Secondary | ICD-10-CM

## 2019-01-27 DIAGNOSIS — F411 Generalized anxiety disorder: Secondary | ICD-10-CM

## 2019-01-27 DIAGNOSIS — Z130 Encounter for screening for diseases of the blood and blood-forming organs and certain disorders involving the immune mechanism: Secondary | ICD-10-CM

## 2019-01-27 DIAGNOSIS — Z13220 Encounter for screening for lipoid disorders: Secondary | ICD-10-CM | POA: Diagnosis not present

## 2019-01-27 DIAGNOSIS — Z1329 Encounter for screening for other suspected endocrine disorder: Secondary | ICD-10-CM | POA: Diagnosis not present

## 2019-01-27 DIAGNOSIS — Z1159 Encounter for screening for other viral diseases: Secondary | ICD-10-CM | POA: Diagnosis not present

## 2019-01-27 DIAGNOSIS — Z0000 Encounter for general adult medical examination without abnormal findings: Secondary | ICD-10-CM | POA: Diagnosis not present

## 2019-01-27 DIAGNOSIS — Z79899 Other long term (current) drug therapy: Secondary | ICD-10-CM

## 2019-01-27 DIAGNOSIS — Z131 Encounter for screening for diabetes mellitus: Secondary | ICD-10-CM

## 2019-01-27 MED ORDER — NALTREXONE HCL 50 MG PO TABS: 50.0000 mg | ORAL_TABLET | Freq: Every day | ORAL | 0 refills | Status: DC

## 2019-01-27 MED ORDER — OMEPRAZOLE 20 MG PO CPDR: DELAYED_RELEASE_CAPSULE | ORAL | 0 refills | Status: DC

## 2019-01-27 NOTE — Patient Instructions (Addendum)
Can try boric acid 600mg  suppositories for prevention or treatment of yeast or bacterial vaginosis.   You CAN NOT use opioids 7-10 days before treatment with this or during treatment OR if will cause withdrawal symptoms and CAN hurt you   Naltrexone can help block craving for opioids and alcohol. Before starting treatment it is important for you to understand a few things: Accidental opioid overdose: Patients who had been treated with naltrexone may respond to lower opioid doses than previously used. This could result in potentially life-threatening opioid intoxication. Patients should be aware that they may be more sensitive to lower doses of opioids after naltrexone treatment is discontinued, after a missed dose, or near the end of the dosing interval. Warn patients that any attempt to overcome opioid blockade during naltrexone therapy, could potentially lead to fatal opioid overdose; the opioid competitive receptor blockade produced by naltrexone is potentially surmountable in the presence of large amounts of opioids. Acute opioid withdrawal: May precipitate symptoms of acute withdrawal in opioid-dependent patients, including pain, hypertension, sweating, agitation, and irritability; in neonates: shrill cry, failure to feed.  General eating tips  What to Avoid . Avoid added sugars o Often added sugar can be found in processed foods such as many condiments, dry cereals, cakes, cookies, chips, crisps, crackers, candies, sweetened drinks, etc.  o Read labels and AVOID/DECREASE use of foods with the following in their ingredient list: Sugar, fructose, high fructose corn syrup, sucrose, glucose, maltose, dextrose, molasses, cane sugar, brown sugar, any type of syrup, agave nectar, etc.   . Avoid snacking in between meals- drink water or if you feel you need a snack, pick a high water content snack such as cucumbers, watermelon, or any veggie.  Marland Kitchen Avoid foods made with flour o If you are going to eat  food made with flour, choose those made with whole-grains; and, minimize your consumption as much as is tolerable . Avoid processed foods o These foods are generally stocked in the middle of the grocery store.  o Focus on shopping on the perimeter of the grocery.  What to Include . Vegetables o GREEN LEAFY VEGETABLES: Kale, spinach, mustard greens, collard greens, cabbage, broccoli, etc. o OTHER: Asparagus, cauliflower, eggplant, carrots, peas, Brussel sprouts, tomatoes, bell peppers, zucchini, beets, cucumbers, etc. . Grains, seeds, and legumes o Beans: kidney beans, black eyed peas, garbanzo beans, black beans, pinto beans, etc. o Whole, unrefined grains: brown rice, barley, bulgur, oatmeal, etc. . Healthy fats  o Avoid highly processed fats such as vegetable oil o Examples of healthy fats: avocado, olives, virgin olive oil, dark chocolate (?72% Cocoa), nuts (peanuts, almonds, walnuts, cashews, pecans, etc.) o Please still do small amount of these healthy fats, they are dense in calories.  . Low - Moderate Intake of Animal Sources of Protein o Meat sources: chicken, [place], salmon, tuna. Limit to 4 ounces of meat at one time or the size of your palm. o Consider limiting dairy sources, but when choosing dairy focus on: PLAIN [place] yogurt, cottage cheese, high-protein milk . Fruit o Choose berries

## 2019-01-28 LAB — URINALYSIS, ROUTINE W REFLEX MICROSCOPIC
Bacteria, UA: NONE SEEN /HPF
Bilirubin Urine: NEGATIVE
Glucose, UA: NEGATIVE
Hgb urine dipstick: NEGATIVE
Hyaline Cast: NONE SEEN /LPF
Ketones, ur: NEGATIVE
Nitrite: NEGATIVE
Protein, ur: NEGATIVE
RBC / HPF: NONE SEEN /HPF (ref 0–2)
Specific Gravity, Urine: 1.006 (ref 1.001–1.03)
Squamous Epithelial / HPF: NONE SEEN /HPF (ref <=5)
WBC, UA: NONE SEEN /HPF (ref 0–5)
pH: 6.5 (ref 5.0–8.0)

## 2019-01-28 LAB — C. TRACHOMATIS/N. GONORRHOEAE RNA
C. trachomatis RNA, TMA: NOT DETECTED
N. gonorrhoeae RNA, TMA: NOT DETECTED

## 2019-01-28 LAB — COMPLETE METABOLIC PANEL WITH GFR
AG Ratio: 1.7 (calc) (ref 1.0–2.5)
ALT: 11 U/L (ref 6–29)
AST: 15 U/L (ref 10–30)
Albumin: 4.4 g/dL (ref 3.6–5.1)
Alkaline phosphatase (APISO): 64 U/L (ref 31–125)
BUN: 8 mg/dL (ref 7–25)
CO2: 25 mmol/L (ref 20–32)
Calcium: 9.6 mg/dL (ref 8.6–10.2)
Chloride: 104 mmol/L (ref 98–110)
Creat: 0.71 mg/dL (ref 0.50–1.10)
GFR, Est African American: 129 mL/min/{1.73_m2} (ref >=60)
GFR, Est Non African American: 111 mL/min/{1.73_m2} (ref >=60)
Globulin: 2.6 g/dL (calc) (ref 1.9–3.7)
Glucose, Bld: 75 mg/dL (ref 65–99)
Potassium: 3.9 mmol/L (ref 3.5–5.3)
Sodium: 139 mmol/L (ref 135–146)
Total Bilirubin: 0.4 mg/dL (ref 0.2–1.2)
Total Protein: 7 g/dL (ref 6.1–8.1)

## 2019-01-28 LAB — CBC WITH DIFFERENTIAL/PLATELET
Absolute Monocytes: 540 cells/uL (ref 200–950)
Basophils Absolute: 29 cells/uL (ref 0–200)
Basophils Relative: 0.4 %
Eosinophils Absolute: 241 cells/uL (ref 15–500)
Eosinophils Relative: 3.3 %
HCT: 39.7 % (ref 35.0–45.0)
Hemoglobin: 13.1 g/dL (ref 11.7–15.5)
Lymphs Abs: 1964 cells/uL (ref 850–3900)
MCH: 29 pg (ref 27.0–33.0)
MCHC: 33 g/dL (ref 32.0–36.0)
MCV: 88 fL (ref 80.0–100.0)
MPV: 10.3 fL (ref 7.5–12.5)
Monocytes Relative: 7.4 %
Neutro Abs: 4526 cells/uL (ref 1500–7800)
Neutrophils Relative %: 62 %
Platelets: 241 10*3/uL (ref 140–400)
RBC: 4.51 10*6/uL (ref 3.80–5.10)
RDW: 12.6 % (ref 11.0–15.0)
Total Lymphocyte: 26.9 %
WBC: 7.3 10*3/uL (ref 3.8–10.8)

## 2019-01-28 LAB — HEMOGLOBIN A1C
Hgb A1c MFr Bld: 4.9 % of total Hgb (ref <5.7)
Mean Plasma Glucose: 94 (calc)
eAG (mmol/L): 5.2 (calc)

## 2019-01-28 LAB — VITAMIN D 25 HYDROXY (VIT D DEFICIENCY, FRACTURES): Vit D, 25-Hydroxy: 23 ng/mL — ABNORMAL LOW (ref 30–100)

## 2019-01-28 LAB — LIPID PANEL
Cholesterol: 254 mg/dL — ABNORMAL HIGH (ref <200)
HDL: 63 mg/dL (ref >=50)
LDL Cholesterol (Calc): 157 mg/dL (calc) — ABNORMAL HIGH
Non-HDL Cholesterol (Calc): 191 mg/dL (calc) — ABNORMAL HIGH (ref <130)
Total CHOL/HDL Ratio: 4 (calc) (ref <5.0)
Triglycerides: 184 mg/dL — ABNORMAL HIGH (ref <150)

## 2019-01-28 LAB — VITAMIN B12: Vitamin B-12: 685 pg/mL (ref 200–1100)

## 2019-01-28 LAB — IRON, TOTAL/TOTAL IRON BINDING CAP
%SAT: 26 % (calc) (ref 16–45)
Iron: 94 ug/dL (ref 40–190)
TIBC: 360 mcg/dL (calc) (ref 250–450)

## 2019-01-28 LAB — MICROALBUMIN / CREATININE URINE RATIO
Creatinine, Urine: 29 mg/dL (ref 20–275)
Microalb, Ur: <0.2 mg/dL

## 2019-01-28 LAB — HIV ANTIBODY (ROUTINE TESTING W REFLEX): HIV 1&2 Ab, 4th Generation: NONREACTIVE

## 2019-01-28 LAB — TSH: TSH: 1.51 mIU/L

## 2019-01-28 LAB — MAGNESIUM: Magnesium: 1.9 mg/dL (ref 1.5–2.5)

## 2019-01-28 LAB — HSV(HERPES SIMPLEX VRS) I + II AB-IGG
HAV 1 IGG,TYPE SPECIFIC AB: <0.9 index
HSV 2 IGG,TYPE SPECIFIC AB: <0.9 index

## 2019-01-28 LAB — RPR: RPR Ser Ql: NONREACTIVE

## 2019-02-20 ENCOUNTER — Encounter (HOSPITAL_COMMUNITY): Payer: Self-pay

## 2019-02-20 ENCOUNTER — Other Ambulatory Visit: Payer: Self-pay

## 2019-02-20 ENCOUNTER — Emergency Department (HOSPITAL_COMMUNITY)
Admission: EM | Admit: 2019-02-20 | Discharge: 2019-02-20 | Disposition: A | Discharge status: Home / Self Care | Payer: BC Managed Care – PPO | Attending: Emergency Medicine | Admitting: Emergency Medicine

## 2019-02-20 DIAGNOSIS — Z79899 Other long term (current) drug therapy: Secondary | ICD-10-CM | POA: Diagnosis not present

## 2019-02-20 DIAGNOSIS — Z87891 Personal history of nicotine dependence: Secondary | ICD-10-CM | POA: Diagnosis not present

## 2019-02-20 DIAGNOSIS — K2900 Acute gastritis without bleeding: Secondary | ICD-10-CM | POA: Diagnosis not present

## 2019-02-20 DIAGNOSIS — M7918 Myalgia, other site: Secondary | ICD-10-CM | POA: Diagnosis not present

## 2019-02-20 DIAGNOSIS — R1110 Vomiting, unspecified: Secondary | ICD-10-CM | POA: Diagnosis present

## 2019-02-20 LAB — CBC
HCT: 47.7 % — ABNORMAL HIGH (ref 36.0–46.0)
Hemoglobin: 16.2 g/dL — ABNORMAL HIGH (ref 12.0–15.0)
MCH: 29.3 pg (ref 26.0–34.0)
MCHC: 34 g/dL (ref 30.0–36.0)
MCV: 86.3 fL (ref 80.0–100.0)
Platelets: 396 10*3/uL (ref 150–400)
RBC: 5.53 MIL/uL — ABNORMAL HIGH (ref 3.87–5.11)
RDW: 12.9 % (ref 11.5–15.5)
WBC: 17.9 10*3/uL — ABNORMAL HIGH (ref 4.0–10.5)
nRBC: 0 % (ref 0.0–0.2)

## 2019-02-20 LAB — COMPREHENSIVE METABOLIC PANEL
ALT: 20 U/L (ref 0–44)
AST: 26 U/L (ref 15–41)
Albumin: 5.5 g/dL — ABNORMAL HIGH (ref 3.5–5.0)
Alkaline Phosphatase: 77 U/L (ref 38–126)
Anion gap: 20 — ABNORMAL HIGH (ref 5–15)
BUN: 31 mg/dL — ABNORMAL HIGH (ref 6–20)
CO2: 23 mmol/L (ref 22–32)
Calcium: 9.8 mg/dL (ref 8.9–10.3)
Chloride: 94 mmol/L — ABNORMAL LOW (ref 98–111)
Creatinine, Ser: 1.13 mg/dL — ABNORMAL HIGH (ref 0.44–1.00)
GFR calc Af Amer: >60 mL/min (ref >60)
GFR calc non Af Amer: >60 mL/min (ref >60)
Glucose, Bld: 125 mg/dL — ABNORMAL HIGH (ref 70–99)
Potassium: 3.1 mmol/L — ABNORMAL LOW (ref 3.5–5.1)
Sodium: 137 mmol/L (ref 135–145)
Total Bilirubin: 1.1 mg/dL (ref 0.3–1.2)
Total Protein: 9.6 g/dL — ABNORMAL HIGH (ref 6.5–8.1)

## 2019-02-20 LAB — LIPASE, BLOOD: Lipase: 19 U/L (ref 11–51)

## 2019-02-20 LAB — I-STAT BETA HCG BLOOD, ED (MC, WL, AP ONLY): I-stat hCG, quantitative: <5 m[IU]/mL (ref <5)

## 2019-02-20 MED ORDER — ONDANSETRON HCL 4 MG PO TABS: 4.0000 mg | ORAL_TABLET | Freq: Four times a day (QID) | ORAL | 0 refills | Status: DC

## 2019-02-20 MED ORDER — STERILE WATER FOR INJECTION IJ SOLN
INTRAMUSCULAR | Status: AC
  Administered 2019-02-20: 10:00:00
  Filled 2019-02-20: qty 10

## 2019-02-20 MED ORDER — PANTOPRAZOLE SODIUM 40 MG PO TBEC: 40.0000 mg | DELAYED_RELEASE_TABLET | Freq: Every day | ORAL | 0 refills | Status: DC

## 2019-02-20 MED ORDER — PANTOPRAZOLE SODIUM 40 MG IV SOLR
40.0000 mg | Freq: Once | INTRAVENOUS | Status: AC
  Administered 2019-02-20: 40 mg via INTRAVENOUS
  Filled 2019-02-20: qty 40

## 2019-02-20 MED ORDER — SODIUM CHLORIDE 0.9 % IV BOLUS
1000.0000 mL | Freq: Once | INTRAVENOUS | Status: AC
  Administered 2019-02-20: 1000 mL via INTRAVENOUS

## 2019-02-20 MED ORDER — SODIUM CHLORIDE 0.9% FLUSH: 3.0000 mL | Freq: Once | INTRAVENOUS | Status: DC

## 2019-02-20 MED ORDER — ONDANSETRON HCL 4 MG/2ML IJ SOLN
4.0000 mg | Freq: Once | INTRAMUSCULAR | Status: AC
  Administered 2019-02-20: 4 mg via INTRAVENOUS
  Filled 2019-02-20: qty 2

## 2019-02-20 NOTE — Discharge Instructions (Addendum)
Prescription for nausea medication and stomach medication.  Increase fluids.  Return if pain increases especially in the right lower abdomen which is a sign of appendicitis.

## 2019-02-20 NOTE — ED Triage Notes (Signed)
Patient states she had too much wine 3 days ago and has been vomiting since. Patient also c/o generalized body aches.

## 2019-02-20 NOTE — ED Notes (Signed)
Patient escorted to bathroom with urine cup at this time.

## 2019-02-20 NOTE — ED Provider Notes (Addendum)
[facility] DEPT Provider Note   CSN: [id number] Arrival date & time: 02/20/19  [id number]     History   Chief Complaint Chief Complaint  Patient presents with  . Emesis  . Generalized Body Aches    HPI Angela Dixon is a 33 y.o. female.     Nausea and vomiting for 3 days after drinking alcohol excessively with associated body aches.  She feels dehydrated.  No fever, chills, dysuria, cough, stiff neck.  Severity is moderate.  She has tried nothing at home.     Past Medical History:  Diagnosis Date  . Abnormal Pap smear   . Acid reflux   . Anemia   . Chlamydia 2006  . Fracture   . Hypercholesterolemia   . Vitamin D deficiency     Patient Active Problem List   Diagnosis Date Noted  . Excessive drinking of alcohol 09/29/2018  . Stress and adjustment reaction 09/29/2018  . Nausea & vomiting 05/12/2018  . Diarrhea 05/12/2018  . GERD (gastroesophageal reflux disease) 01/09/2017  . Medication management 01/31/2015  . Depression 02/08/2014  . Generalized anxiety disorder 02/08/2014  . Vitamin D deficiency     Past Surgical History:  Procedure Laterality Date  . COLPOSCOPY  2006  . WISDOM TOOTH EXTRACTION       OB History    Gravida  2   Para  1   Term  1   Preterm      AB      Living  1     SAB      TAB      Ectopic      Multiple      Live Births  1            Home Medications    Prior to Admission medications   Medication Sig Start Date End Date Taking? Authorizing Provider  albuterol (PROVENTIL HFA;VENTOLIN HFA) 108 (90 Base) MCG/ACT inhaler Inhale 2 puffs into the lungs every 4 hours as needed for wheezing or shortness of breath. Patient taking differently: Inhale 2 puffs into the lungs every 4 (four) hours as needed for wheezing or shortness of breath.  01/16/17  Yes Unk Pinto, MD  cetirizine (ZYRTEC) 10 MG tablet Take 10 mg by mouth daily.   Yes [provider]  Cholecalciferol 4000 UNITS  CAPS Take 4,000 Units by mouth daily.    Yes [provider]  Flaxseed, Linseed, (FLAX SEED OIL) 1000 MG CAPS Take 1,000 mg by mouth daily.    Yes [provider]  hyoscyamine (LEVSIN SL) 0.125 MG SL tablet Take 1 to 2 tablets 3 to 4 x day if needed for Nausea, vomiting, cramping or diarrhea Patient taking differently: Take 0.125-0.25 mg by mouth every 6 (six) hours as needed for cramping (nausea, vomiting or diarrhea).  10/22/18  Yes Liane Comber, NP  ibuprofen (ADVIL,MOTRIN) 200 MG tablet Take 400 mg by mouth every 6 (six) hours as needed for moderate pain.   Yes [provider]  Magnesium 500 MG CAPS Take 500 mg by mouth daily.    Yes [provider]  methocarbamol (ROBAXIN-750) 750 MG tablet Take 1 tablet (750 mg total) by mouth every 8 (eight) hours as needed for muscle spasms. 09/19/17  Yes Liane Comber, NP  Multiple Vitamin (MULTIVITAMIN WITH MINERALS) TABS tablet Take 1 tablet by mouth daily.   Yes [provider]  naltrexone (DEPADE) 50 MG tablet Take 1 tablet (50 mg total) by mouth daily. 01/27/19  Yes [name], PA-C  omeprazole (PRILOSEC) 20 MG capsule Take 1 capsule Daily for Indigestion & Acid Reflux Patient taking differently: Take 20 mg by mouth daily.  01/27/19  Yes Unk Pinto, MD  Probiotic Product (PROBIOTIC DAILY PO) Take 1 capsule by mouth daily.    Yes [provider]  promethazine (PHENERGAN) 25 MG suppository Place 1 suppository (25 mg total) rectally every 6 (six) hours as needed for nausea or vomiting. 10/08/18  Yes Liane Comber, NP  sucralfate (CARAFATE) 1 g tablet Take 1 tablet (1 g total) by mouth 4 (four) times daily -  with meals and at bedtime for 14 days. 05/10/18 02/20/19 Yes Curatolo, Adam, DO  venlafaxine XR (EFFEXOR-XR) 150 MG 24 hr capsule Take 1 capsule (150 mg total) by mouth daily with breakfast. 08/25/18  Yes [name], PA-C  azelastine (ASTELIN) 0.1 % nasal spray SPRAY 1 SPRAY INTO BOTH  NOSTRILS TWICE DAILY Patient not taking: No sig reported 03/01/18   Unk Pinto, MD  ondansetron (ZOFRAN) 4 MG tablet Take 1 tablet (4 mg total) by mouth every 6 (six) hours. 02/20/19   Nat Christen, MD  pantoprazole (PROTONIX) 40 MG tablet Take 1 tablet (40 mg total) by mouth daily. 02/20/19   Nat Christen, MD  promethazine (PHENERGAN) 25 MG tablet Take 1 tablet (25 mg total) by mouth every 6 (six) hours as needed for nausea or vomiting. Patient not taking: Reported on 02/20/2019 12/29/17   Hayden Rasmussen, MD    Family History Family History  Problem Relation Age of Onset  . Hyperlipidemia Mother   . Asthma Sister   . Hypertension Maternal Grandmother   . Diabetes Maternal Grandmother   . Heart disease Maternal Grandfather   . Cancer Paternal Grandmother        throat  . Cancer Paternal Grandfather        lung    Social History Social History   Tobacco Use  . Smoking status: Former Smoker    Packs/day: 1.00    Types: Cigarettes    Quit date: 07/09/2006    Years since quitting: 12.6  . Smokeless tobacco: Never Used  Substance Use Topics  . Alcohol use: Yes    Alcohol/week: 30.0 standard drinks    Types: 30 Standard drinks or equivalent per week  . Drug use: No     Allergies   Neomycin   Review of Systems Review of Systems  All other systems reviewed and are negative.    Physical Exam Updated Vital Signs BP 131/84   Pulse 91   Temp 98.4 F (36.9 C) (Oral)   Resp 16   Ht 5' 4.5" (1.638 m)   Wt 99.8 kg   SpO2 99%   BMI 37.18 kg/m   Physical Exam Vitals signs and nursing note reviewed.  Constitutional:      Appearance: She is well-developed.     Comments: Dry mucous membranes.  HENT:     Head: Normocephalic and atraumatic.  Eyes:     Conjunctiva/sclera: Conjunctivae normal.  Neck:     Musculoskeletal: Neck supple.  Cardiovascular:     Rate and Rhythm: Normal rate and regular rhythm.  Pulmonary:     Effort: Pulmonary effort is normal.      Breath sounds: Normal breath sounds.  Abdominal:     General: Bowel sounds are normal.     Palpations: Abdomen is soft.  Musculoskeletal: Normal range of motion.  Skin:    General: Skin is warm and dry.  Neurological:  General: No focal deficit present.     Mental Status: She is alert and oriented to person, place, and time.  Psychiatric:        Behavior: Behavior normal.      ED Treatments / Results  Labs (all labs ordered are listed, but only abnormal results are displayed) Labs Reviewed  COMPREHENSIVE METABOLIC PANEL - Abnormal; Notable for the following components:      Result Value   Potassium 3.1 (*)    Chloride 94 (*)    Glucose, Bld 125 (*)    BUN 31 (*)    Creatinine, Ser 1.13 (*)    Total Protein 9.6 (*)    Albumin 5.5 (*)    Anion gap 20 (*)    All other components within normal limits  CBC - Abnormal; Notable for the following components:   WBC 17.9 (*)    RBC 5.53 (*)    Hemoglobin 16.2 (*)    HCT 47.7 (*)    All other components within normal limits  LIPASE, BLOOD  URINALYSIS, ROUTINE W REFLEX MICROSCOPIC  I-STAT BETA HCG BLOOD, ED ([facility], [facility], [facility] ONLY)    EKG None  Radiology No results found.  Procedures Procedures (including critical care time)  Medications Ordered in ED Medications  sodium chloride flush (NS) 0.9 % injection 3 mL (0 mLs Intravenous Hold 02/20/19 1000)  sodium chloride 0.9 % bolus 1,000 mL (1,000 mLs Intravenous New Bag/Given 02/20/19 1125)  ondansetron (ZOFRAN) injection 4 mg (4 mg Intravenous Given 02/20/19 0956)  pantoprazole (PROTONIX) injection 40 mg (40 mg Intravenous Given 02/20/19 0956)  sodium chloride 0.9 % bolus 1,000 mL (1,000 mLs Intravenous New Bag/Given 02/20/19 0957)  sterile water (preservative free) injection (  Given 02/20/19 1011)     Initial Impression / Assessment and Plan / ED Course  I have reviewed the triage vital signs and the nursing notes.  Pertinent labs & imaging results that were available  during my care of the patient were reviewed by me and considered in my medical decision making (see chart for details).        History and physical secondary to gastritis secondary to alcohol consumption.  Basic labs, IV fluids, IV Zofran, IV Protonix.  1240: Patient rechecked.  She is feeling better.  No acute abdomen noted on physical exam.  Elevated white count noted.  This was discussed with the patient.  She will return if pain travels to the right lower quadrant to rule out appendicitis.  Discharge medications Protonix 40 mg and Zofran 4 mg. Final Clinical Impressions(s) / ED Diagnoses   Final diagnoses:  Acute gastritis without hemorrhage, unspecified gastritis type    ED Discharge Orders         Ordered    ondansetron (ZOFRAN) 4 MG tablet  Every 6 hours     02/20/19 1251    pantoprazole (PROTONIX) 40 MG tablet  Daily     02/20/19 1251           Nat Christen, MD 02/20/19 [id number]    Nat Christen, MD 02/20/19 1253

## 2019-02-21 ENCOUNTER — Other Ambulatory Visit: Payer: Self-pay

## 2019-02-21 ENCOUNTER — Ambulatory Visit (HOSPITAL_COMMUNITY)
Admission: EM | Admit: 2019-02-21 | Discharge: 2019-02-21 | Disposition: A | Discharge status: Home / Self Care | Payer: BC Managed Care – PPO | Attending: Emergency Medicine | Admitting: Emergency Medicine

## 2019-02-21 ENCOUNTER — Encounter (HOSPITAL_COMMUNITY): Payer: Self-pay | Admitting: Emergency Medicine

## 2019-02-21 DIAGNOSIS — R112 Nausea with vomiting, unspecified: Secondary | ICD-10-CM | POA: Diagnosis not present

## 2019-02-21 LAB — CBC WITH DIFFERENTIAL/PLATELET
Abs Immature Granulocytes: 0.05 10*3/uL (ref 0.00–0.07)
Basophils Absolute: 0 10*3/uL (ref 0.0–0.1)
Basophils Relative: 0 %
Eosinophils Absolute: 0 10*3/uL (ref 0.0–0.5)
Eosinophils Relative: 0 %
HCT: 43.9 % (ref 36.0–46.0)
Hemoglobin: 14.8 g/dL (ref 12.0–15.0)
Immature Granulocytes: 0 %
Lymphocytes Relative: 15 %
Lymphs Abs: 1.9 10*3/uL (ref 0.7–4.0)
MCH: 28.8 pg (ref 26.0–34.0)
MCHC: 33.7 g/dL (ref 30.0–36.0)
MCV: 85.6 fL (ref 80.0–100.0)
Monocytes Absolute: 0.9 10*3/uL (ref 0.1–1.0)
Monocytes Relative: 7 %
Neutro Abs: 9.6 10*3/uL — ABNORMAL HIGH (ref 1.7–7.7)
Neutrophils Relative %: 78 %
Platelets: 346 10*3/uL (ref 150–400)
RBC: 5.13 MIL/uL — ABNORMAL HIGH (ref 3.87–5.11)
RDW: 12.6 % (ref 11.5–15.5)
WBC: 12.5 10*3/uL — ABNORMAL HIGH (ref 4.0–10.5)
nRBC: 0 % (ref 0.0–0.2)

## 2019-02-21 LAB — COMPREHENSIVE METABOLIC PANEL
ALT: 19 U/L (ref 0–44)
AST: 23 U/L (ref 15–41)
Albumin: 4.6 g/dL (ref 3.5–5.0)
Alkaline Phosphatase: 55 U/L (ref 38–126)
Anion gap: 18 — ABNORMAL HIGH (ref 5–15)
BUN: 11 mg/dL (ref 6–20)
CO2: 22 mmol/L (ref 22–32)
Calcium: 9.3 mg/dL (ref 8.9–10.3)
Chloride: 99 mmol/L (ref 98–111)
Creatinine, Ser: 0.8 mg/dL (ref 0.44–1.00)
GFR calc Af Amer: >60 mL/min (ref >60)
GFR calc non Af Amer: >60 mL/min (ref >60)
Glucose, Bld: 88 mg/dL (ref 70–99)
Potassium: 3 mmol/L — ABNORMAL LOW (ref 3.5–5.1)
Sodium: 139 mmol/L (ref 135–145)
Total Bilirubin: 0.8 mg/dL (ref 0.3–1.2)
Total Protein: 7.6 g/dL (ref 6.5–8.1)

## 2019-02-21 LAB — LIPASE, BLOOD: Lipase: 16 U/L (ref 11–51)

## 2019-02-21 MED ORDER — ONDANSETRON 4 MG PO TBDP
ORAL_TABLET | ORAL | Status: AC
  Filled 2019-02-21: qty 1

## 2019-02-21 MED ORDER — ONDANSETRON 4 MG PO TBDP
4.0000 mg | ORAL_TABLET | Freq: Once | ORAL | Status: AC
  Administered 2019-02-21: 4 mg via ORAL

## 2019-02-21 MED ORDER — METOCLOPRAMIDE HCL 5 MG/ML IJ SOLN
10.0000 mg | Freq: Once | INTRAMUSCULAR | Status: AC
  Administered 2019-02-21: 10 mg via INTRAMUSCULAR

## 2019-02-21 MED ORDER — METOCLOPRAMIDE HCL 5 MG/ML IJ SOLN
INTRAMUSCULAR | Status: AC
  Filled 2019-02-21: qty 2

## 2019-02-21 NOTE — ED Provider Notes (Signed)
[place]    CSN: [id number] Arrival date & time: 02/21/19  1200      History   Chief Complaint Chief Complaint  Patient presents with  . Emesis    HPI Angela Dixon is a 33 y.o. female.   Rhiyan Umland presents with complaints of nausea and vomiting. Started 11/12, after drinking a large amount of wine the night prior. Went to the ER yesterday with labs obtained, fluids and anti-emetics provided. Nausea controlled and patient discharged home. She states she slept through the night but nausea and vomiting restarted this morning. Has vomited approximately 15 times today of yellow and clear emesis. No blood or black. Some upper abdominal pain but mainly just achy feeling without significant pain. No fevers. No diarrhea. Has had similar episodes in the past which was triggered by alcohol. She was started on protonix. She has used a phenergan suppository as well as zofran today which have not helped. She states she is not certain that the zofran stayed down. Still urinating. No dizziness. She was found to have a WBC of 17 in the ER last night, patient was without pain or fevers.     ROS per HPI, negative if not otherwise mentioned.      Past Medical History:  Diagnosis Date  . Abnormal Pap smear   . Acid reflux   . Anemia   . Chlamydia 2006  . Fracture   . Hypercholesterolemia   . Vitamin D deficiency     Patient Active Problem List   Diagnosis Date Noted  . Excessive drinking of alcohol 09/29/2018  . Stress and adjustment reaction 09/29/2018  . Nausea & vomiting 05/12/2018  . Diarrhea 05/12/2018  . GERD (gastroesophageal reflux disease) 01/09/2017  . Medication management 01/31/2015  . Depression 02/08/2014  . Generalized anxiety disorder 02/08/2014  . Vitamin D deficiency     Past Surgical History:  Procedure Laterality Date  . COLPOSCOPY  2006  . WISDOM TOOTH EXTRACTION      OB History    Gravida  2   Para  1   Term  1   Preterm      AB     Living  1     SAB      TAB      Ectopic      Multiple      Live Births  1            Home Medications    Prior to Admission medications   Medication Sig Start Date End Date Taking? Authorizing Provider  albuterol (PROVENTIL HFA;VENTOLIN HFA) 108 (90 Base) MCG/ACT inhaler Inhale 2 puffs into the lungs every 4 hours as needed for wheezing or shortness of breath. Patient taking differently: Inhale 2 puffs into the lungs every 4 (four) hours as needed for wheezing or shortness of breath.  01/16/17   Unk Pinto, MD  azelastine (ASTELIN) 0.1 % nasal spray SPRAY 1 SPRAY INTO BOTH NOSTRILS TWICE DAILY Patient not taking: No sig reported 03/01/18   Unk Pinto, MD  cetirizine (ZYRTEC) 10 MG tablet Take 10 mg by mouth daily.    [provider]  Cholecalciferol 4000 UNITS CAPS Take 4,000 Units by mouth daily.     [provider]  Flaxseed, Linseed, (FLAX SEED OIL) 1000 MG CAPS Take 1,000 mg by mouth daily.     [provider]  hyoscyamine (LEVSIN SL) 0.125 MG SL tablet Take 1 to 2 tablets 3 to 4 x day if needed  for Nausea, vomiting, cramping or diarrhea Patient taking differently: Take 0.125-0.25 mg by mouth every 6 (six) hours as needed for cramping (nausea, vomiting or diarrhea).  10/22/18   Liane Comber, NP  ibuprofen (ADVIL,MOTRIN) 200 MG tablet Take 400 mg by mouth every 6 (six) hours as needed for moderate pain.    [provider]  Magnesium 500 MG CAPS Take 500 mg by mouth daily.     [provider]  methocarbamol (ROBAXIN-750) 750 MG tablet Take 1 tablet (750 mg total) by mouth every 8 (eight) hours as needed for muscle spasms. 09/19/17   Liane Comber, NP  Multiple Vitamin (MULTIVITAMIN WITH MINERALS) TABS tablet Take 1 tablet by mouth daily.    [provider]  naltrexone (DEPADE) 50 MG tablet Take 1 tablet (50 mg total) by mouth daily. 01/27/19   [name], PA-C  omeprazole (PRILOSEC) 20 MG capsule Take  1 capsule Daily for Indigestion & Acid Reflux Patient taking differently: Take 20 mg by mouth daily.  01/27/19   Unk Pinto, MD  ondansetron (ZOFRAN) 4 MG tablet Take 1 tablet (4 mg total) by mouth every 6 (six) hours. 02/20/19   Nat Christen, MD  pantoprazole (PROTONIX) 40 MG tablet Take 1 tablet (40 mg total) by mouth daily. 02/20/19   Nat Christen, MD  Probiotic Product (PROBIOTIC DAILY PO) Take 1 capsule by mouth daily.     [provider]  promethazine (PHENERGAN) 25 MG suppository Place 1 suppository (25 mg total) rectally every 6 (six) hours as needed for nausea or vomiting. 10/08/18   Liane Comber, NP  promethazine (PHENERGAN) 25 MG tablet Take 1 tablet (25 mg total) by mouth every 6 (six) hours as needed for nausea or vomiting. Patient not taking: Reported on 02/20/2019 12/29/17   Hayden Rasmussen, MD  sucralfate (CARAFATE) 1 g tablet Take 1 tablet (1 g total) by mouth 4 (four) times daily -  with meals and at bedtime for 14 days. 05/10/18 02/20/19  Lennice Sites, DO  venlafaxine XR (EFFEXOR-XR) 150 MG 24 hr capsule Take 1 capsule (150 mg total) by mouth daily with breakfast. 08/25/18   [name], PA-C    Family History Family History  Problem Relation Age of Onset  . Hyperlipidemia Mother   . Asthma Sister   . Hypertension Maternal Grandmother   . Diabetes Maternal Grandmother   . Heart disease Maternal Grandfather   . Cancer Paternal Grandmother        throat  . Cancer Paternal Grandfather        lung    Social History Social History   Tobacco Use  . Smoking status: Former Smoker    Packs/day: 1.00    Types: Cigarettes    Quit date: 07/09/2006    Years since quitting: 12.6  . Smokeless tobacco: Never Used  Substance Use Topics  . Alcohol use: Yes    Alcohol/week: 30.0 standard drinks    Types: 30 Standard drinks or equivalent per week  . Drug use: No     Allergies   Neomycin   Review of Systems Review of Systems   Physical Exam Triage  Vital Signs ED Triage Vitals  Enc Vitals Group     BP 02/21/19 1253 130/77     Pulse Rate 02/21/19 1253 82     Resp 02/21/19 1253 18     Temp 02/21/19 1253 98.2 F (36.8 C)     Temp src --      SpO2 02/21/19 1253 100 %  Weight --      Height --      Head Circumference --      Peak Flow --      Pain Score 02/21/19 1255 0     Pain Loc --      Pain Edu? --      Excl. in [place]? --    No data found.  Updated Vital Signs BP 130/77   Pulse 82   Temp 98.2 F (36.8 C)   Resp 18   SpO2 100%    Physical Exam Constitutional:      General: She is in acute distress.     Appearance: She is well-developed.     Comments: Patient appears uncomfortable with nausea   Cardiovascular:     Rate and Rhythm: Normal rate.  Pulmonary:     Effort: Pulmonary effort is normal.  Abdominal:     Palpations: Abdomen is soft.     Tenderness: There is abdominal tenderness in the epigastric area.     Comments: Mild epigastric pain; no active vomiting throughout exam   Skin:    General: Skin is warm and dry.  Neurological:     Mental Status: She is alert and oriented to person, place, and time.      UC Treatments / Results  Labs (all labs ordered are listed, but only abnormal results are displayed) Labs Reviewed  CBC WITH DIFFERENTIAL/PLATELET  COMPREHENSIVE METABOLIC PANEL  LIPASE, BLOOD    EKG   Radiology No results found.  Procedures Procedures (including critical care time)  Medications Ordered in UC Medications  metoCLOPramide (REGLAN) injection 10 mg (10 mg Intramuscular Given 02/21/19 1341)  ondansetron (ZOFRAN-ODT) disintegrating tablet 4 mg (4 mg Oral Given 02/21/19 1341)  ondansetron (ZOFRAN-ODT) 4 MG disintegrating tablet (has no administration in time range)  ondansetron (ZOFRAN-ODT) 4 MG disintegrating tablet (has no administration in time range)  metoCLOPramide (REGLAN) 5 MG/ML injection (has no administration in time range)    Initial Impression / Assessment and  Plan / UC Course  I have reviewed the triage vital signs and the nursing notes.  Pertinent labs & imaging results that were available during my care of the patient were reviewed by me and considered in my medical decision making (see chart for details).     Patient without emesis for entire stay in clinic. Mild improvement in nausea with reglan and zofran. Labs obtained to compare from visit in er yesterday, although abdominal exam without any new acute findings. Patient has nausea medications at home. Er precautions and follow up discussed. Patient verbalized understanding and agreeable to plan.   Final Clinical Impressions(s) / UC Diagnoses   Final diagnoses:  Non-intractable vomiting with nausea, unspecified vomiting type     Discharge Instructions     Small frequent sips of fluids- Pedialyte, Gatorade, water, broth- to maintain hydration.   Limited food intake until symptoms improve, then advance to bland diet.  I will call you if your lab results are concerning.   May continue with your zofran and/or phenergan as needed for nausea or vomiting.  Any worsening of symptoms please go to the ER.     ED Prescriptions    None     PDMP not reviewed this encounter.   Zigmund Gottron, NP 02/21/19 1407

## 2019-02-21 NOTE — ED Triage Notes (Signed)
Pt c/o vomiting on and off since Thursday. Pt was seen in the ER yesterday and given two bags of fluid and zofran and protonix. And took a phenergen suppository today. Pt c/o still vomiting.

## 2019-02-21 NOTE — Discharge Instructions (Signed)
Small frequent sips of fluids- Pedialyte, Gatorade, water, broth- to maintain hydration.   Limited food intake until symptoms improve, then advance to bland diet.  I will call you if your lab results are concerning.   May continue with your zofran and/or phenergan as needed for nausea or vomiting.  Any worsening of symptoms please go to the ER.

## 2019-02-22 ENCOUNTER — Other Ambulatory Visit: Payer: Self-pay

## 2019-02-22 ENCOUNTER — Telehealth (HOSPITAL_COMMUNITY): Payer: Self-pay | Admitting: Emergency Medicine

## 2019-02-22 ENCOUNTER — Emergency Department (HOSPITAL_COMMUNITY)
Admission: EM | Admit: 2019-02-22 | Discharge: 2019-02-22 | Disposition: A | Discharge status: Home / Self Care | Payer: BC Managed Care – PPO | Attending: Emergency Medicine | Admitting: Emergency Medicine

## 2019-02-22 ENCOUNTER — Encounter (HOSPITAL_COMMUNITY): Payer: Self-pay

## 2019-02-22 DIAGNOSIS — R112 Nausea with vomiting, unspecified: Secondary | ICD-10-CM | POA: Diagnosis present

## 2019-02-22 DIAGNOSIS — K2920 Alcoholic gastritis without bleeding: Secondary | ICD-10-CM

## 2019-02-22 DIAGNOSIS — Z87891 Personal history of nicotine dependence: Secondary | ICD-10-CM | POA: Insufficient documentation

## 2019-02-22 DIAGNOSIS — Z79899 Other long term (current) drug therapy: Secondary | ICD-10-CM | POA: Diagnosis not present

## 2019-02-22 MED ORDER — SODIUM CHLORIDE 0.9 % IV BOLUS
1000.0000 mL | Freq: Once | INTRAVENOUS | Status: AC
  Administered 2019-02-22: 06:00:00 1000 mL via INTRAVENOUS

## 2019-02-22 MED ORDER — ALUM & MAG HYDROXIDE-SIMETH 200-200-20 MG/5ML PO SUSP
30.0000 mL | Freq: Once | ORAL | Status: AC
  Administered 2019-02-22: 30 mL via ORAL
  Filled 2019-02-22: qty 30

## 2019-02-22 MED ORDER — PANTOPRAZOLE SODIUM 40 MG IV SOLR
40.0000 mg | Freq: Once | INTRAVENOUS | Status: AC
  Administered 2019-02-22: 06:00:00 40 mg via INTRAVENOUS
  Filled 2019-02-22: qty 40

## 2019-02-22 MED ORDER — LIDOCAINE VISCOUS HCL 2 % MT SOLN
15.0000 mL | Freq: Once | OROMUCOSAL | Status: AC
  Administered 2019-02-22: 06:00:00 15 mL via ORAL
  Filled 2019-02-22: qty 15

## 2019-02-22 MED ORDER — METOCLOPRAMIDE HCL 5 MG/ML IJ SOLN
10.0000 mg | Freq: Once | INTRAMUSCULAR | Status: AC
  Administered 2019-02-22: 06:00:00 10 mg via INTRAVENOUS
  Filled 2019-02-22: qty 2

## 2019-02-22 MED ORDER — METOCLOPRAMIDE HCL 5 MG/ML IJ SOLN
10.0000 mg | Freq: Once | INTRAMUSCULAR | Status: AC
  Administered 2019-02-22: 10 mg via INTRAVENOUS
  Filled 2019-02-22: qty 2

## 2019-02-22 NOTE — ED Provider Notes (Signed)
7:07 AM Care assumed from Dr. Leonette Monarch.  At time of transfer of care, patient is awaiting rehydration with IV fluids and Reglan.  Patient was seen twice over the last 2 days and had reassuring work-up reportedly.  Will hold on further labs at this time but will likely p.o. challenge patient after fluids and nausea medicine.  Anticipate discharge for outpatient management of alcohol gastritis after she is feeling better.  Patient was feeling better after further medications and was discharged home for outpatient management. Clinical Impression: 1. Acute alcoholic gastritis, presence of bleeding unspecified   2. Nausea and vomiting in adult     Disposition: Discharge  Condition: Good  I have discussed the results, Dx and Tx plan with the pt(& family if present). He/she/they expressed understanding and agree(s) with the plan. Discharge instructions discussed at great length. Strict return precautions discussed and pt &/or family have verbalized understanding of the instructions. No further questions at time of discharge.    Discharge Medication List as of 02/22/2019  2:23 PM      Follow Up: Unk Pinto, MD [address] [facility] [place] [id number] [phone]  Schedule an appointment as soon as possible for a visit       Jarae Panas, Gwenyth Allegra, MD 02/22/19 [phone]

## 2019-02-22 NOTE — ED Triage Notes (Signed)
Patient arrived stating she has been seen multiple time for nausea and vomiting over the last five day. Given prescriptions but has had little relief. Denies any abdominal pain or diarrhea or constipation.

## 2019-02-22 NOTE — ED Provider Notes (Signed)
[facility] DEPT Provider Note  CSN: [id number] Arrival date & time: 02/22/19 0503  Chief Complaint(s) Emesis  HPI Angela Dixon is a 33 y.o. female with a history of alcohol abuse who presents to the emergency department with epigastric abdominal discomfort and recurrent nausea and nonbloody nonbilious emesis.  Patient has been seen twice in the last several days for the same.  Has had reassuring labs.  Here for continued nausea and vomiting.  Patient denies any recent fevers or infections.  She reports that the trigger was drinking 2 bottles of wine 6 days ago.  Reports that she is already discussed quitting with her primary care provider who prescribed her naltrexone.  No diarrhea.  No urinary symptoms.  No other physical complaints.  Recent labs without evidence of pancreatitis or biliary obstruction.  HPI  Past Medical History Past Medical History:  Diagnosis Date   Abnormal Pap smear    Acid reflux    Anemia    Chlamydia 2006   Fracture    Hypercholesterolemia    Vitamin D deficiency    Patient Active Problem List   Diagnosis Date Noted   Excessive drinking of alcohol 09/29/2018   Stress and adjustment reaction 09/29/2018   Nausea & vomiting 05/12/2018   Diarrhea 05/12/2018   GERD (gastroesophageal reflux disease) 01/09/2017   Medication management 01/31/2015   Depression 02/08/2014   Generalized anxiety disorder 02/08/2014   Vitamin D deficiency    Home Medication(s) Prior to Admission medications   Medication Sig Start Date End Date Taking? Authorizing Provider  albuterol (PROVENTIL HFA;VENTOLIN HFA) 108 (90 Base) MCG/ACT inhaler Inhale 2 puffs into the lungs every 4 hours as needed for wheezing or shortness of breath. Patient taking differently: Inhale 2 puffs into the lungs every 4 (four) hours as needed for wheezing or shortness of breath.  01/16/17   Unk Pinto, MD  azelastine (ASTELIN) 0.1 % nasal spray SPRAY 1  SPRAY INTO BOTH NOSTRILS TWICE DAILY Patient not taking: No sig reported 03/01/18   Unk Pinto, MD  cetirizine (ZYRTEC) 10 MG tablet Take 10 mg by mouth daily.    [provider]  Cholecalciferol 4000 UNITS CAPS Take 4,000 Units by mouth daily.     [provider]  Flaxseed, Linseed, (FLAX SEED OIL) 1000 MG CAPS Take 1,000 mg by mouth daily.     [provider]  hyoscyamine (LEVSIN SL) 0.125 MG SL tablet Take 1 to 2 tablets 3 to 4 x day if needed for Nausea, vomiting, cramping or diarrhea Patient taking differently: Take 0.125-0.25 mg by mouth every 6 (six) hours as needed for cramping (nausea, vomiting or diarrhea).  10/22/18   Liane Comber, NP  ibuprofen (ADVIL,MOTRIN) 200 MG tablet Take 400 mg by mouth every 6 (six) hours as needed for moderate pain.    [provider]  Magnesium 500 MG CAPS Take 500 mg by mouth daily.     [provider]  methocarbamol (ROBAXIN-750) 750 MG tablet Take 1 tablet (750 mg total) by mouth every 8 (eight) hours as needed for muscle spasms. 09/19/17   Liane Comber, NP  Multiple Vitamin (MULTIVITAMIN WITH MINERALS) TABS tablet Take 1 tablet by mouth daily.    [provider]  naltrexone (DEPADE) 50 MG tablet Take 1 tablet (50 mg total) by mouth daily. 01/27/19   [name], PA-C  omeprazole (PRILOSEC) 20 MG capsule Take 1 capsule Daily for Indigestion & Acid Reflux Patient taking differently: Take 20 mg by mouth daily.  01/27/19   Unk Pinto, MD  ondansetron (ZOFRAN) 4 MG tablet Take 1 tablet (4 mg total) by mouth every 6 (six) hours. 02/20/19   Nat Christen, MD  pantoprazole (PROTONIX) 40 MG tablet Take 1 tablet (40 mg total) by mouth daily. 02/20/19   Nat Christen, MD  Probiotic Product (PROBIOTIC DAILY PO) Take 1 capsule by mouth daily.     [provider]  promethazine (PHENERGAN) 25 MG suppository Place 1 suppository (25 mg total) rectally every 6 (six) hours as needed for nausea or  vomiting. 10/08/18   Liane Comber, NP  promethazine (PHENERGAN) 25 MG tablet Take 1 tablet (25 mg total) by mouth every 6 (six) hours as needed for nausea or vomiting. Patient not taking: Reported on 02/20/2019 12/29/17   Hayden Rasmussen, MD  sucralfate (CARAFATE) 1 g tablet Take 1 tablet (1 g total) by mouth 4 (four) times daily -  with meals and at bedtime for 14 days. 05/10/18 02/20/19  Lennice Sites, DO  venlafaxine XR (EFFEXOR-XR) 150 MG 24 hr capsule Take 1 capsule (150 mg total) by mouth daily with breakfast. 08/25/18   [name], PA-C                                                                                                                                    Past Surgical History Past Surgical History:  Procedure Laterality Date   COLPOSCOPY  2006   WISDOM TOOTH EXTRACTION     Family History Family History  Problem Relation Age of Onset   Hyperlipidemia Mother    Asthma Sister    Hypertension Maternal Grandmother    Diabetes Maternal Grandmother    Heart disease Maternal Grandfather    Cancer Paternal Grandmother        throat   Cancer Paternal Grandfather        lung    Social History Social History   Tobacco Use   Smoking status: Former Smoker    Packs/day: 1.00    Types: Cigarettes    Quit date: 07/09/2006    Years since quitting: 12.6   Smokeless tobacco: Never Used  Substance Use Topics   Alcohol use: Yes    Alcohol/week: 30.0 standard drinks    Types: 30 Standard drinks or equivalent per week   Drug use: No   Allergies Neomycin  Review of Systems Review of Systems All other systems are reviewed and are negative for acute change except as noted in the HPI  Physical Exam Vital Signs  I have reviewed the triage vital signs BP 136/81 (BP Location: Left Arm)    Pulse 85    Temp 97.9 F (36.6 C) (Oral)    Resp 18    SpO2 99%   Physical Exam Vitals signs reviewed.  Constitutional:      General: She is not in acute distress.     Appearance: She is well-developed. She is not diaphoretic.  HENT:     Head: Normocephalic and atraumatic.     Right Ear: External ear normal.     Left Ear: External ear normal.     Nose: Nose normal.  Eyes:     General: No scleral icterus.    Conjunctiva/sclera: Conjunctivae normal.  Neck:     Musculoskeletal: Normal range of motion.     Trachea: Phonation normal.  Cardiovascular:     Rate and Rhythm: Normal rate and regular rhythm.  Pulmonary:     Effort: Pulmonary effort is normal. No respiratory distress.     Breath sounds: No stridor.  Abdominal:     General: There is no distension.     Tenderness: There is no abdominal tenderness. There is no guarding or rebound.  Musculoskeletal: Normal range of motion.  Neurological:     Mental Status: She is alert and oriented to person, place, and time.  Psychiatric:        Behavior: Behavior normal.     ED Results and Treatments Labs (all labs ordered are listed, but only abnormal results are displayed) Labs Reviewed - No data to display                                                                                                                       EKG  EKG Interpretation  Date/Time:    Ventricular Rate:    PR Interval:    QRS Duration:   QT Interval:    QTC Calculation:   R Axis:     Text Interpretation:        Radiology No results found.  Pertinent labs & imaging results that were available during my care of the patient were reviewed by me and considered in my medical decision making (see chart for details).  Medications Ordered in ED Medications  metoCLOPramide (REGLAN) injection 10 mg (10 mg Intravenous Given 02/22/19 0627)  sodium chloride 0.9 % bolus 1,000 mL (1,000 mLs Intravenous New Bag/Given 02/22/19 0626)  alum & mag hydroxide-simeth (MAALOX/MYLANTA) 200-200-20 MG/5ML suspension 30 mL (30 mLs Oral Given 02/22/19 0627)    And  lidocaine (XYLOCAINE) 2 % viscous mouth solution 15 mL (15 mLs Oral Given  02/22/19 0627)  pantoprazole (PROTONIX) injection 40 mg (40 mg Intravenous Given 02/22/19 0627)  Procedures Procedures  (including critical care time)  Medical Decision Making / ED Course I have reviewed the nursing notes for this encounter and the patient's prior records (if available in EHR or on provided paperwork).   Angela Dixon was evaluated in Emergency Department on 02/22/2019 for the symptoms described in the history of present illness. She was evaluated in the context of the global COVID-19 pandemic, which necessitated consideration that the patient might be at risk for infection with the SARS-CoV-2 virus that causes COVID-19. Institutional protocols and algorithms that pertain to the evaluation of patients at risk for COVID-19 are in a state of rapid change based on information released by regulatory bodies including the CDC and federal and state organizations. These policies and algorithms were followed during the patient's care in the ED.  Alcoholic gastritis with persistent nausea and nonbloody nonbilious emesis.  Recent labs reassuring.  Will provide patient with IV fluids, antiemetics and Protonix.  Plan for reassess and p.o. challenge.  Patient care turned over to Dr Sherry Ruffing. Patient case and results discussed in detail; please see their note for further ED managment.          Final Clinical Impression(s) / ED Diagnoses Final diagnoses:  Acute alcoholic gastritis, presence of bleeding unspecified  Nausea and vomiting in adult      This chart was dictated using voice recognition software.  Despite best efforts to proofread,  errors can occur which can change the documentation meaning.   Fatima Blank, MD 02/22/19 [phone]

## 2019-02-22 NOTE — Telephone Encounter (Signed)
Attempted to reach patient regarding labs and instruction, no answer, left Vm to return call.

## 2019-02-24 ENCOUNTER — Encounter (HOSPITAL_COMMUNITY): Payer: Self-pay | Admitting: Emergency Medicine

## 2019-02-24 ENCOUNTER — Emergency Department (HOSPITAL_COMMUNITY)
Admission: EM | Admit: 2019-02-24 | Discharge: 2019-02-24 | Disposition: A | Discharge status: Home / Self Care | Payer: BC Managed Care – PPO | Attending: Emergency Medicine | Admitting: Emergency Medicine

## 2019-02-24 ENCOUNTER — Emergency Department (HOSPITAL_COMMUNITY): Payer: BC Managed Care – PPO

## 2019-02-24 DIAGNOSIS — Z87891 Personal history of nicotine dependence: Secondary | ICD-10-CM | POA: Diagnosis not present

## 2019-02-24 DIAGNOSIS — R110 Nausea: Secondary | ICD-10-CM | POA: Diagnosis not present

## 2019-02-24 DIAGNOSIS — E876 Hypokalemia: Secondary | ICD-10-CM | POA: Insufficient documentation

## 2019-02-24 DIAGNOSIS — R109 Unspecified abdominal pain: Secondary | ICD-10-CM | POA: Diagnosis not present

## 2019-02-24 DIAGNOSIS — Z79899 Other long term (current) drug therapy: Secondary | ICD-10-CM | POA: Diagnosis not present

## 2019-02-24 DIAGNOSIS — R112 Nausea with vomiting, unspecified: Secondary | ICD-10-CM | POA: Insufficient documentation

## 2019-02-24 DIAGNOSIS — R1111 Vomiting without nausea: Secondary | ICD-10-CM | POA: Diagnosis not present

## 2019-02-24 DIAGNOSIS — R1110 Vomiting, unspecified: Secondary | ICD-10-CM | POA: Diagnosis not present

## 2019-02-24 LAB — CBC WITH DIFFERENTIAL/PLATELET
Abs Immature Granulocytes: 0.05 10*3/uL (ref 0.00–0.07)
Basophils Absolute: 0 10*3/uL (ref 0.0–0.1)
Basophils Relative: 0 %
Eosinophils Absolute: 0 10*3/uL (ref 0.0–0.5)
Eosinophils Relative: 0 %
HCT: 41 % (ref 36.0–46.0)
Hemoglobin: 13.9 g/dL (ref 12.0–15.0)
Immature Granulocytes: 1 %
Lymphocytes Relative: 13 %
Lymphs Abs: 1.4 10*3/uL (ref 0.7–4.0)
MCH: 29.1 pg (ref 26.0–34.0)
MCHC: 33.9 g/dL (ref 30.0–36.0)
MCV: 85.8 fL (ref 80.0–100.0)
Monocytes Absolute: 0.5 10*3/uL (ref 0.1–1.0)
Monocytes Relative: 5 %
Neutro Abs: 8.6 10*3/uL — ABNORMAL HIGH (ref 1.7–7.7)
Neutrophils Relative %: 81 %
Platelets: 320 10*3/uL (ref 150–400)
RBC: 4.78 MIL/uL (ref 3.87–5.11)
RDW: 12.6 % (ref 11.5–15.5)
WBC: 10.6 10*3/uL — ABNORMAL HIGH (ref 4.0–10.5)
nRBC: 0 % (ref 0.0–0.2)

## 2019-02-24 LAB — COMPREHENSIVE METABOLIC PANEL
ALT: 19 U/L (ref 0–44)
AST: 21 U/L (ref 15–41)
Albumin: 4.2 g/dL (ref 3.5–5.0)
Alkaline Phosphatase: 56 U/L (ref 38–126)
Anion gap: 15 (ref 5–15)
BUN: 6 mg/dL (ref 6–20)
CO2: 22 mmol/L (ref 22–32)
Calcium: 8.9 mg/dL (ref 8.9–10.3)
Chloride: 101 mmol/L (ref 98–111)
Creatinine, Ser: 0.6 mg/dL (ref 0.44–1.00)
GFR calc Af Amer: >60 mL/min (ref >60)
GFR calc non Af Amer: >60 mL/min (ref >60)
Glucose, Bld: 111 mg/dL — ABNORMAL HIGH (ref 70–99)
Potassium: 2.8 mmol/L — ABNORMAL LOW (ref 3.5–5.1)
Sodium: 138 mmol/L (ref 135–145)
Total Bilirubin: 0.4 mg/dL (ref 0.3–1.2)
Total Protein: 7.4 g/dL (ref 6.5–8.1)

## 2019-02-24 LAB — URINALYSIS, ROUTINE W REFLEX MICROSCOPIC
Bilirubin Urine: NEGATIVE
Glucose, UA: NEGATIVE mg/dL
Hgb urine dipstick: NEGATIVE
Ketones, ur: 5 mg/dL — AB
Leukocytes,Ua: NEGATIVE
Nitrite: NEGATIVE
Protein, ur: NEGATIVE mg/dL
Specific Gravity, Urine: 1.044 — ABNORMAL HIGH (ref 1.005–1.030)
pH: 7 (ref 5.0–8.0)

## 2019-02-24 LAB — I-STAT BETA HCG BLOOD, ED (MC, WL, AP ONLY): I-stat hCG, quantitative: <5 m[IU]/mL (ref <5)

## 2019-02-24 LAB — LIPASE, BLOOD: Lipase: 24 U/L (ref 11–51)

## 2019-02-24 MED ORDER — ONDANSETRON HCL 4 MG/2ML IJ SOLN
4.0000 mg | Freq: Once | INTRAMUSCULAR | Status: AC
  Administered 2019-02-24: 4 mg via INTRAVENOUS
  Filled 2019-02-24: qty 2

## 2019-02-24 MED ORDER — ONDANSETRON 8 MG PO TBDP: 8.0000 mg | ORAL_TABLET | Freq: Three times a day (TID) | ORAL | 0 refills | Status: DC | PRN

## 2019-02-24 MED ORDER — SODIUM CHLORIDE (PF) 0.9 % IJ SOLN
INTRAMUSCULAR | Status: AC
  Filled 2019-02-24: qty 50

## 2019-02-24 MED ORDER — IOHEXOL 300 MG/ML  SOLN
100.0000 mL | Freq: Once | INTRAMUSCULAR | Status: AC | PRN
  Administered 2019-02-24: 100 mL via INTRAVENOUS

## 2019-02-24 MED ORDER — MORPHINE SULFATE (PF) 4 MG/ML IV SOLN
4.0000 mg | Freq: Once | INTRAVENOUS | Status: AC
  Administered 2019-02-24: 12:00:00 4 mg via INTRAVENOUS
  Filled 2019-02-24: qty 1

## 2019-02-24 MED ORDER — PANTOPRAZOLE SODIUM 20 MG PO TBEC: 20.0000 mg | DELAYED_RELEASE_TABLET | Freq: Every day | ORAL | 1 refills | Status: DC

## 2019-02-24 MED ORDER — POTASSIUM CHLORIDE CRYS ER 20 MEQ PO TBCR
40.0000 meq | EXTENDED_RELEASE_TABLET | Freq: Once | ORAL | Status: AC
  Administered 2019-02-24: 40 meq via ORAL
  Filled 2019-02-24: qty 2

## 2019-02-24 MED ORDER — POTASSIUM CHLORIDE ER 10 MEQ PO TBCR: 10.0000 meq | EXTENDED_RELEASE_TABLET | Freq: Two times a day (BID) | ORAL | 0 refills | Status: DC

## 2019-02-24 MED ORDER — SODIUM CHLORIDE 0.9 % IV BOLUS
1000.0000 mL | Freq: Once | INTRAVENOUS | Status: AC
  Administered 2019-02-24: 1000 mL via INTRAVENOUS

## 2019-02-24 MED ORDER — SODIUM CHLORIDE 0.9 % IV SOLN: INTRAVENOUS | Status: DC

## 2019-02-24 NOTE — ED Notes (Signed)
ED Provider at bedside. 

## 2019-02-24 NOTE — ED Triage Notes (Signed)
Per EMS, patient from home, c/o N/V x7 days. Seen x3 for same recently. Patient reports symptoms initially started after drinking wine. Ambulatory.

## 2019-02-24 NOTE — ED Provider Notes (Signed)
[facility] DEPT Provider Note   CSN: [id number] Arrival date & time: 02/24/19  1114     History   Chief Complaint Chief Complaint  Patient presents with   Emesis    HPI Angela Dixon is a 33 y.o. female.     HPI Pt has been having trouble wit nausea and vomiting for around 7 days now.  This started after a bout of heavy alcohol use.  However she continues to have the sx mostly in the am.  She has felt constipated but has had bowel movements.  This am she started having the nausea and vomiting again.  No fevers.  No diarrhea.  No dysuria.  Last alcohol use was 1 week ago Wednesday. NO prior abd surgery. Pt does smoke marijuana daily.  Past Medical History:  Diagnosis Date   Abnormal Pap smear    Acid reflux    Anemia    Chlamydia 2006   Fracture    Hypercholesterolemia    Vitamin D deficiency     Patient Active Problem List   Diagnosis Date Noted   Excessive drinking of alcohol 09/29/2018   Stress and adjustment reaction 09/29/2018   Nausea & vomiting 05/12/2018   Diarrhea 05/12/2018   GERD (gastroesophageal reflux disease) 01/09/2017   Medication management 01/31/2015   Depression 02/08/2014   Generalized anxiety disorder 02/08/2014   Vitamin D deficiency     Past Surgical History:  Procedure Laterality Date   COLPOSCOPY  2006   WISDOM TOOTH EXTRACTION       OB History    Gravida  2   Para  1   Term  1   Preterm      AB      Living  1     SAB      TAB      Ectopic      Multiple      Live Births  1            Home Medications    Prior to Admission medications   Medication Sig Start Date End Date Taking? Authorizing Provider  albuterol (PROVENTIL HFA;VENTOLIN HFA) 108 (90 Base) MCG/ACT inhaler Inhale 2 puffs into the lungs every 4 hours as needed for wheezing or shortness of breath. Patient taking differently: Inhale 2 puffs into the lungs every 4 (four) hours as needed for wheezing  or shortness of breath.  01/16/17  Yes Unk Pinto, MD  cetirizine (ZYRTEC) 10 MG tablet Take 10 mg by mouth daily.   Yes [provider]  Cholecalciferol 4000 UNITS CAPS Take 4,000 Units by mouth daily.    Yes [provider]  Flaxseed, Linseed, (FLAX SEED OIL) 1000 MG CAPS Take 1,000 mg by mouth daily.    Yes [provider]  hyoscyamine (LEVSIN SL) 0.125 MG SL tablet Take 1 to 2 tablets 3 to 4 x day if needed for Nausea, vomiting, cramping or diarrhea Patient taking differently: Take 0.125-0.25 mg by mouth every 6 (six) hours as needed for cramping (nausea, vomiting or diarrhea).  10/22/18  Yes Liane Comber, NP  ibuprofen (ADVIL,MOTRIN) 200 MG tablet Take 400 mg by mouth every 6 (six) hours as needed for moderate pain.   Yes [provider]  Magnesium 500 MG CAPS Take 500 mg by mouth daily.    Yes [provider]  methocarbamol (ROBAXIN-750) 750 MG tablet Take 1 tablet (750 mg total) by mouth every 8 (eight) hours as needed for muscle spasms. 09/19/17  Yes Liane Comber, NP  Multiple Vitamin (MULTIVITAMIN WITH MINERALS) TABS tablet Take 1 tablet by mouth daily.   Yes [provider]  naltrexone (DEPADE) 50 MG tablet Take 1 tablet (50 mg total) by mouth daily. 01/27/19  Yes [name], PA-C  omeprazole (PRILOSEC) 20 MG capsule Take 1 capsule Daily for Indigestion & Acid Reflux Patient taking differently: Take 20 mg by mouth daily.  01/27/19  Yes Unk Pinto, MD  ondansetron (ZOFRAN) 4 MG tablet Take 1 tablet (4 mg total) by mouth every 6 (six) hours. 02/20/19  Yes Nat Christen, MD  promethazine (PHENERGAN) 25 MG suppository Place 1 suppository (25 mg total) rectally every 6 (six) hours as needed for nausea or vomiting. 10/08/18  Yes Liane Comber, NP  sucralfate (CARAFATE) 1 g tablet Take 1 tablet (1 g total) by mouth 4 (four) times daily -  with meals and at bedtime for 14 days. Patient taking differently: Take 1 g by mouth 4  (four) times daily as needed.  05/10/18 02/24/19 Yes Curatolo, Adam, DO  venlafaxine XR (EFFEXOR-XR) 150 MG 24 hr capsule Take 1 capsule (150 mg total) by mouth daily with breakfast. 08/25/18  Yes [name], PA-C  azelastine (ASTELIN) 0.1 % nasal spray SPRAY 1 SPRAY INTO BOTH NOSTRILS TWICE DAILY Patient not taking: No sig reported 03/01/18   Unk Pinto, MD  ondansetron (ZOFRAN ODT) 8 MG disintegrating tablet Take 1 tablet (8 mg total) by mouth every 8 (eight) hours as needed for nausea or vomiting. 02/24/19   Dorie Rank, MD  pantoprazole (PROTONIX) 20 MG tablet Take 1 tablet (20 mg total) by mouth daily. 02/24/19   Dorie Rank, MD  potassium chloride (KLOR-CON) 10 MEQ tablet Take 1 tablet (10 mEq total) by mouth 2 (two) times daily. 02/24/19   Dorie Rank, MD  promethazine (PHENERGAN) 25 MG tablet Take 1 tablet (25 mg total) by mouth every 6 (six) hours as needed for nausea or vomiting. Patient not taking: Reported on 02/20/2019 12/29/17   Hayden Rasmussen, MD    Family History Family History  Problem Relation Age of Onset   Hyperlipidemia Mother    Asthma Sister    Hypertension Maternal Grandmother    Diabetes Maternal Grandmother    Heart disease Maternal Grandfather    Cancer Paternal Grandmother        throat   Cancer Paternal Grandfather        lung    Social History Social History   Tobacco Use   Smoking status: Former Smoker    Packs/day: 1.00    Types: Cigarettes    Quit date: 07/09/2006    Years since quitting: 12.6   Smokeless tobacco: Never Used  Substance Use Topics   Alcohol use: Yes    Alcohol/week: 30.0 standard drinks    Types: 30 Standard drinks or equivalent per week   Drug use: No     Allergies   Neomycin   Review of Systems Review of Systems  All other systems reviewed and are negative.    Physical Exam Updated Vital Signs BP 128/69    Pulse 71    Temp 98.7 F (37.1 C)    Resp 17    SpO2 100%   Physical Exam Vitals signs  and nursing note reviewed.  Constitutional:      General: She is not in acute distress.    Appearance: She is well-developed.  HENT:     Head: Normocephalic and atraumatic.     Right Ear: External ear  normal.     Left Ear: External ear normal.  Eyes:     General: No scleral icterus.       Right eye: No discharge.        Left eye: No discharge.     Conjunctiva/sclera: Conjunctivae normal.  Neck:     Musculoskeletal: Neck supple.     Trachea: No tracheal deviation.  Cardiovascular:     Rate and Rhythm: Normal rate and regular rhythm.  Pulmonary:     Effort: Pulmonary effort is normal. No respiratory distress.     Breath sounds: Normal breath sounds. No stridor. No wheezing or rales.  Abdominal:     General: Bowel sounds are normal. There is no distension.     Palpations: Abdomen is soft.     Tenderness: There is abdominal tenderness in the epigastric area. There is no guarding or rebound.  Musculoskeletal:        General: No tenderness.  Skin:    General: Skin is warm and dry.     Findings: No rash.  Neurological:     Mental Status: She is alert.     Cranial Nerves: No cranial nerve deficit (no facial droop, extraocular movements intact, no slurred speech).     Sensory: No sensory deficit.     Motor: No abnormal muscle tone or seizure activity.     Coordination: Coordination normal.      ED Treatments / Results  Labs (all labs ordered are listed, but only abnormal results are displayed) Labs Reviewed  COMPREHENSIVE METABOLIC PANEL - Abnormal; Notable for the following components:      Result Value   Potassium 2.8 (*)    Glucose, Bld 111 (*)    All other components within normal limits  CBC WITH DIFFERENTIAL/PLATELET - Abnormal; Notable for the following components:   WBC 10.6 (*)    Neutro Abs 8.6 (*)    All other components within normal limits  URINALYSIS, ROUTINE W REFLEX MICROSCOPIC - Abnormal; Notable for the following components:   Color, Urine STRAW (*)     Specific Gravity, Urine 1.044 (*)    Ketones, ur 5 (*)    All other components within normal limits  LIPASE, BLOOD  I-STAT BETA HCG BLOOD, ED ([facility], [facility], [facility] ONLY)    EKG None  Radiology Ct Abdomen Pelvis W Contrast  Result Date: 02/24/2019 CLINICAL DATA:  33 year old female with abdominal pain, nausea vomiting. Negative pregnancy test. EXAM: CT ABDOMEN AND PELVIS WITH CONTRAST TECHNIQUE: Multidetector CT imaging of the abdomen and pelvis was performed using the standard protocol following bolus administration of intravenous contrast. CONTRAST:  134mL OMNIPAQUE IOHEXOL 300 MG/ML  SOLN COMPARISON:  Abdominal ultrasound dated 10/21/2018. FINDINGS: Lower chest: Minimal bibasilar dependent atelectasis. The visualized lung bases are otherwise clear. No intra-abdominal free air or free fluid. Hepatobiliary: Probable mild fatty infiltration of the liver. Subcentimeter left hepatic hypodense focus is too small to characterize. No intrahepatic biliary ductal dilatation. The gallbladder is unremarkable. Pancreas: Unremarkable. No pancreatic ductal dilatation or surrounding inflammatory changes. Spleen: Normal in size without focal abnormality. Adrenals/Urinary Tract: The adrenal glands are unremarkable. There is no hydronephrosis on either side. The visualized ureters and urinary bladder appear unremarkable. Stomach/Bowel: There is no bowel obstruction or active inflammation. The appendix is normal. Vascular/Lymphatic: The abdominal aorta and IVC are unremarkable. No portal venous gas. There is no adenopathy. Reproductive: The uterus is anteverted. An intrauterine device is noted. Evaluation of the IUD is limited on CT. The IUD however appears rotated  in relation to the axis of the uterus. Further evaluation with pelvic ultrasound is recommended to assess for positioning of the IUD and exclude myometrial penetration. The ovaries are grossly unremarkable. Other: Small fat containing umbilical hernia. Musculoskeletal:  Chronic bilateral sacroiliitis, right greater left. No acute osseous pathology. IMPRESSION: 1. No acute intra-abdominal or pelvic pathology. No bowel obstruction or active inflammation. Normal appendix. 2. Rotated appearance of the IUD in relation to the axis of the uterus. Further evaluation with pelvic ultrasound is recommended to assess for positioning of the IUD and exclude myometrial penetration. 3. Chronic bilateral sacroiliitis, right greater left. Electronically Signed   By: Anner Crete M.D.   On: 02/24/2019 14:37    Procedures Procedures (including critical care time)  Medications Ordered in ED Medications  sodium chloride 0.9 % bolus 1,000 mL (0 mLs Intravenous Stopped 02/24/19 1324)    And  0.9 %  sodium chloride infusion (has no administration in time range)  sodium chloride (PF) 0.9 % injection (has no administration in time range)  morphine 4 MG/ML injection 4 mg (4 mg Intravenous Given 02/24/19 1158)  ondansetron (ZOFRAN) injection 4 mg (4 mg Intravenous Given 02/24/19 1158)  iohexol (OMNIPAQUE) 300 MG/ML solution 100 mL (100 mLs Intravenous Contrast Given 02/24/19 1414)  potassium chloride SA (KLOR-CON) CR tablet 40 mEq (40 mEq Oral Given 02/24/19 1602)     Initial Impression / Assessment and Plan / ED Course  I have reviewed the triage vital signs and the nursing notes.  Pertinent labs & imaging results that were available during my care of the patient were reviewed by me and considered in my medical decision making (see chart for details).  Clinical Course as of Feb 24 1936  Wed Feb 24, 2019  1505 Nausea improved with treatment   [JK]    Clinical Course User Index [JK] Dorie Rank, MD     Labs notable for hypokalemia.  Vomiting resolved with tx.  CT without acute findings.  Gastritis, PUD a possibility.  Discussed patient's daily marijuana use and the possibility of cannabinoid hyperemesis syndrome.  Dc home with meds below.  Avoid alcohol, marijuana.  outpt gi  follow up  Final Clinical Impressions(s) / ED Diagnoses   Final diagnoses:  Non-intractable vomiting with nausea, unspecified vomiting type  Hypokalemia    ED Discharge Orders         Ordered    ondansetron (ZOFRAN ODT) 8 MG disintegrating tablet  Every 8 hours PRN     02/24/19 1555    pantoprazole (PROTONIX) 20 MG tablet  Daily     02/24/19 1555    potassium chloride (KLOR-CON) 10 MEQ tablet  2 times daily     02/24/19 1555           Dorie Rank, MD 02/24/19 1940

## 2019-02-24 NOTE — Discharge Instructions (Signed)
Avoid alcohol or marijuana.  Take the antacids and antinausea medications as prescribed.  Make sure to complete your course of potassium.  Follow-up with your primary care doctor to have your potassium level rechecked and consider seeing a GI doctor for further evaluation.

## 2019-02-24 NOTE — ED Notes (Signed)
Patient transported to CT 

## 2019-02-25 ENCOUNTER — Encounter: Payer: Self-pay | Admitting: Nurse Practitioner

## 2019-02-28 ENCOUNTER — Other Ambulatory Visit: Payer: Self-pay

## 2019-02-28 ENCOUNTER — Inpatient Hospital Stay (HOSPITAL_COMMUNITY)
Admission: EM | Admit: 2019-02-28 | Discharge: 2019-03-02 | DRG: 395 | Disposition: A | Discharge status: Home / Self Care | Payer: BC Managed Care – PPO | Attending: Internal Medicine | Admitting: Internal Medicine

## 2019-02-28 ENCOUNTER — Encounter (HOSPITAL_COMMUNITY): Payer: Self-pay | Admitting: Emergency Medicine

## 2019-02-28 DIAGNOSIS — E559 Vitamin D deficiency, unspecified: Secondary | ICD-10-CM | POA: Diagnosis present

## 2019-02-28 DIAGNOSIS — Z8249 Family history of ischemic heart disease and other diseases of the circulatory system: Secondary | ICD-10-CM | POA: Diagnosis not present

## 2019-02-28 DIAGNOSIS — F3341 Major depressive disorder, recurrent, in partial remission: Secondary | ICD-10-CM

## 2019-02-28 DIAGNOSIS — Z833 Family history of diabetes mellitus: Secondary | ICD-10-CM

## 2019-02-28 DIAGNOSIS — F32A Depression, unspecified: Secondary | ICD-10-CM | POA: Diagnosis present

## 2019-02-28 DIAGNOSIS — Z881 Allergy status to other antibiotic agents status: Secondary | ICD-10-CM | POA: Diagnosis not present

## 2019-02-28 DIAGNOSIS — R110 Nausea: Secondary | ICD-10-CM | POA: Diagnosis not present

## 2019-02-28 DIAGNOSIS — Z801 Family history of malignant neoplasm of trachea, bronchus and lung: Secondary | ICD-10-CM | POA: Diagnosis not present

## 2019-02-28 DIAGNOSIS — I10 Essential (primary) hypertension: Secondary | ICD-10-CM | POA: Diagnosis not present

## 2019-02-28 DIAGNOSIS — F329 Major depressive disorder, single episode, unspecified: Secondary | ICD-10-CM | POA: Diagnosis present

## 2019-02-28 DIAGNOSIS — R112 Nausea with vomiting, unspecified: Secondary | ICD-10-CM | POA: Diagnosis present

## 2019-02-28 DIAGNOSIS — R1115 Cyclical vomiting syndrome unrelated to migraine: Secondary | ICD-10-CM | POA: Diagnosis not present

## 2019-02-28 DIAGNOSIS — E785 Hyperlipidemia, unspecified: Secondary | ICD-10-CM | POA: Diagnosis present

## 2019-02-28 DIAGNOSIS — F1290 Cannabis use, unspecified, uncomplicated: Secondary | ICD-10-CM

## 2019-02-28 DIAGNOSIS — R1013 Epigastric pain: Secondary | ICD-10-CM | POA: Diagnosis not present

## 2019-02-28 DIAGNOSIS — E876 Hypokalemia: Secondary | ICD-10-CM | POA: Diagnosis present

## 2019-02-28 DIAGNOSIS — K219 Gastro-esophageal reflux disease without esophagitis: Secondary | ICD-10-CM | POA: Diagnosis not present

## 2019-02-28 DIAGNOSIS — Z20828 Contact with and (suspected) exposure to other viral communicable diseases: Secondary | ICD-10-CM | POA: Diagnosis not present

## 2019-02-28 DIAGNOSIS — Z8349 Family history of other endocrine, nutritional and metabolic diseases: Secondary | ICD-10-CM

## 2019-02-28 DIAGNOSIS — F1010 Alcohol abuse, uncomplicated: Secondary | ICD-10-CM | POA: Diagnosis present

## 2019-02-28 DIAGNOSIS — Z800 Family history of malignant neoplasm of digestive organs: Secondary | ICD-10-CM | POA: Diagnosis not present

## 2019-02-28 DIAGNOSIS — G43A0 Cyclical vomiting, not intractable: Secondary | ICD-10-CM | POA: Diagnosis not present

## 2019-02-28 DIAGNOSIS — F411 Generalized anxiety disorder: Secondary | ICD-10-CM | POA: Diagnosis present

## 2019-02-28 DIAGNOSIS — Z87891 Personal history of nicotine dependence: Secondary | ICD-10-CM

## 2019-02-28 DIAGNOSIS — E7800 Pure hypercholesterolemia, unspecified: Secondary | ICD-10-CM | POA: Diagnosis present

## 2019-02-28 DIAGNOSIS — R1111 Vomiting without nausea: Secondary | ICD-10-CM | POA: Diagnosis not present

## 2019-02-28 HISTORY — DX: Nausea with vomiting, unspecified: R11.2

## 2019-02-28 HISTORY — DX: Cannabis use, unspecified, uncomplicated: F12.90

## 2019-02-28 LAB — COMPREHENSIVE METABOLIC PANEL
ALT: 19 U/L (ref 0–44)
AST: 21 U/L (ref 15–41)
Albumin: 4.8 g/dL (ref 3.5–5.0)
Alkaline Phosphatase: 52 U/L (ref 38–126)
Anion gap: 16 — ABNORMAL HIGH (ref 5–15)
BUN: 5 mg/dL — ABNORMAL LOW (ref 6–20)
CO2: 21 mmol/L — ABNORMAL LOW (ref 22–32)
Calcium: 9.7 mg/dL (ref 8.9–10.3)
Chloride: 101 mmol/L (ref 98–111)
Creatinine, Ser: 0.68 mg/dL (ref 0.44–1.00)
GFR calc Af Amer: >60 mL/min (ref >60)
GFR calc non Af Amer: >60 mL/min (ref >60)
Glucose, Bld: 115 mg/dL — ABNORMAL HIGH (ref 70–99)
Potassium: 2.5 mmol/L — CL (ref 3.5–5.1)
Sodium: 138 mmol/L (ref 135–145)
Total Bilirubin: 0.6 mg/dL (ref 0.3–1.2)
Total Protein: 7.9 g/dL (ref 6.5–8.1)

## 2019-02-28 LAB — CBC
HCT: 43.4 % (ref 36.0–46.0)
Hemoglobin: 14.5 g/dL (ref 12.0–15.0)
MCH: 28.8 pg (ref 26.0–34.0)
MCHC: 33.4 g/dL (ref 30.0–36.0)
MCV: 86.3 fL (ref 80.0–100.0)
Platelets: 337 10*3/uL (ref 150–400)
RBC: 5.03 MIL/uL (ref 3.87–5.11)
RDW: 13.1 % (ref 11.5–15.5)
WBC: 14 10*3/uL — ABNORMAL HIGH (ref 4.0–10.5)
nRBC: 0 % (ref 0.0–0.2)

## 2019-02-28 LAB — URINALYSIS, ROUTINE W REFLEX MICROSCOPIC
Bilirubin Urine: NEGATIVE
Glucose, UA: NEGATIVE mg/dL
Ketones, ur: 20 mg/dL — AB
Leukocytes,Ua: NEGATIVE
Nitrite: NEGATIVE
Protein, ur: NEGATIVE mg/dL
Specific Gravity, Urine: 1.01 (ref 1.005–1.030)
pH: 7 (ref 5.0–8.0)

## 2019-02-28 LAB — SARS CORONAVIRUS 2 (TAT 6-24 HRS): SARS Coronavirus 2: NEGATIVE

## 2019-02-28 LAB — I-STAT BETA HCG BLOOD, ED (MC, WL, AP ONLY): I-stat hCG, quantitative: <5 m[IU]/mL (ref <5)

## 2019-02-28 LAB — LIPASE, BLOOD: Lipase: 21 U/L (ref 11–51)

## 2019-02-28 MED ORDER — METHOCARBAMOL 500 MG PO TABS: 750.0000 mg | ORAL_TABLET | Freq: Three times a day (TID) | ORAL | Status: DC | PRN

## 2019-02-28 MED ORDER — ENOXAPARIN SODIUM 40 MG/0.4ML ~~LOC~~ SOLN
40.0000 mg | SUBCUTANEOUS | Status: DC
  Administered 2019-02-28 – 2019-03-02 (×3): 40 mg via SUBCUTANEOUS
  Filled 2019-02-28 (×3): qty 0.4

## 2019-02-28 MED ORDER — ONDANSETRON HCL 4 MG/2ML IJ SOLN
4.0000 mg | Freq: Four times a day (QID) | INTRAMUSCULAR | Status: DC | PRN
  Administered 2019-02-28 – 2019-03-02 (×5): 4 mg via INTRAVENOUS
  Filled 2019-02-28 (×5): qty 2

## 2019-02-28 MED ORDER — VENLAFAXINE HCL ER 150 MG PO CP24
150.0000 mg | ORAL_CAPSULE | Freq: Every day | ORAL | Status: DC
  Filled 2019-02-28: qty 1

## 2019-02-28 MED ORDER — KCL IN DEXTROSE-NACL 40-5-0.45 MEQ/L-%-% IV SOLN
INTRAVENOUS | Status: DC
  Administered 2019-02-28 – 2019-03-02 (×3): via INTRAVENOUS
  Filled 2019-02-28 (×6): qty 1000

## 2019-02-28 MED ORDER — AZELASTINE HCL 0.1 % NA SOLN
1.0000 | Freq: Two times a day (BID) | NASAL | Status: DC | PRN
  Filled 2019-02-28: qty 30

## 2019-02-28 MED ORDER — ONDANSETRON HCL 4 MG/2ML IJ SOLN
4.0000 mg | Freq: Once | INTRAMUSCULAR | Status: AC
  Administered 2019-02-28: 4 mg via INTRAVENOUS
  Filled 2019-02-28: qty 2

## 2019-02-28 MED ORDER — PANTOPRAZOLE SODIUM 20 MG PO TBEC
20.0000 mg | DELAYED_RELEASE_TABLET | Freq: Every day | ORAL | Status: DC
  Filled 2019-02-28: qty 1

## 2019-02-28 MED ORDER — VITAMIN D 25 MCG (1000 UNIT) PO TABS
4000.0000 [IU] | ORAL_TABLET | Freq: Every day | ORAL | Status: DC
  Administered 2019-03-01 – 2019-03-02 (×2): 4000 [IU] via ORAL
  Filled 2019-02-28 (×2): qty 4

## 2019-02-28 MED ORDER — PROMETHAZINE HCL 25 MG RE SUPP
25.0000 mg | Freq: Four times a day (QID) | RECTAL | Status: DC | PRN
  Administered 2019-02-28 – 2019-03-02 (×2): 25 mg via RECTAL
  Filled 2019-02-28 (×2): qty 1

## 2019-02-28 MED ORDER — METOCLOPRAMIDE HCL 5 MG/ML IJ SOLN
10.0000 mg | Freq: Once | INTRAMUSCULAR | Status: AC
  Administered 2019-02-28: 10 mg via INTRAVENOUS
  Filled 2019-02-28: qty 2

## 2019-02-28 MED ORDER — HYOSCYAMINE SULFATE 0.125 MG SL SUBL
0.1250 mg | SUBLINGUAL_TABLET | Freq: Four times a day (QID) | SUBLINGUAL | Status: DC | PRN
  Filled 2019-02-28: qty 2

## 2019-02-28 MED ORDER — SODIUM CHLORIDE 0.9% FLUSH
3.0000 mL | Freq: Once | INTRAVENOUS | Status: AC
  Administered 2019-02-28: 3 mL via INTRAVENOUS

## 2019-02-28 MED ORDER — POTASSIUM CHLORIDE IN NACL 20-0.9 MEQ/L-% IV SOLN: INTRAVENOUS | Status: DC

## 2019-02-28 MED ORDER — STERILE WATER FOR INJECTION IJ SOLN
INTRAMUSCULAR | Status: AC
  Administered 2019-02-28: 1 mL
  Filled 2019-02-28: qty 10

## 2019-02-28 MED ORDER — ACETAMINOPHEN 650 MG RE SUPP
650.0000 mg | Freq: Four times a day (QID) | RECTAL | Status: DC | PRN
  Administered 2019-03-02: 650 mg via RECTAL
  Filled 2019-02-28: qty 1

## 2019-02-28 MED ORDER — ACETAMINOPHEN 325 MG PO TABS
650.0000 mg | ORAL_TABLET | Freq: Four times a day (QID) | ORAL | Status: DC | PRN
  Filled 2019-02-28: qty 2

## 2019-02-28 MED ORDER — NALTREXONE HCL 50 MG PO TABS
50.0000 mg | ORAL_TABLET | Freq: Every day | ORAL | Status: DC
  Filled 2019-02-28 (×3): qty 1

## 2019-02-28 MED ORDER — POTASSIUM CHLORIDE 10 MEQ/100ML IV SOLN
10.0000 meq | INTRAVENOUS | Status: AC
  Administered 2019-02-28 (×2): 10 meq via INTRAVENOUS
  Filled 2019-02-28 (×2): qty 100

## 2019-02-28 MED ORDER — LORATADINE 10 MG PO TABS
10.0000 mg | ORAL_TABLET | Freq: Every day | ORAL | Status: DC
  Administered 2019-03-01 – 2019-03-02 (×2): 10 mg via ORAL
  Filled 2019-02-28 (×2): qty 1

## 2019-02-28 MED ORDER — METOCLOPRAMIDE HCL 5 MG/ML IJ SOLN: 10.0000 mg | Freq: Three times a day (TID) | INTRAMUSCULAR | Status: DC | PRN

## 2019-02-28 MED ORDER — CAPSAICIN 0.075 % EX CREA
TOPICAL_CREAM | Freq: Two times a day (BID) | CUTANEOUS | Status: DC
  Filled 2019-02-28 (×2): qty 60

## 2019-02-28 MED ORDER — MAGNESIUM OXIDE 400 (241.3 MG) MG PO TABS: 400.0000 mg | ORAL_TABLET | Freq: Every day | ORAL | Status: DC

## 2019-02-28 MED ORDER — SUCRALFATE 1 G PO TABS: 1.0000 g | ORAL_TABLET | Freq: Four times a day (QID) | ORAL | Status: DC | PRN

## 2019-02-28 MED ORDER — PANTOPRAZOLE SODIUM 40 MG IV SOLR
40.0000 mg | INTRAVENOUS | Status: DC
  Administered 2019-02-28 – 2019-03-02 (×3): 40 mg via INTRAVENOUS
  Filled 2019-02-28 (×3): qty 40

## 2019-02-28 MED ORDER — SODIUM CHLORIDE 0.9 % IV BOLUS
1000.0000 mL | Freq: Once | INTRAVENOUS | Status: AC
  Administered 2019-02-28: 1000 mL via INTRAVENOUS

## 2019-02-28 MED ORDER — ONDANSETRON HCL 4 MG PO TABS: 4.0000 mg | ORAL_TABLET | Freq: Four times a day (QID) | ORAL | Status: DC | PRN

## 2019-02-28 MED ORDER — ALBUTEROL SULFATE (2.5 MG/3ML) 0.083% IN NEBU: 2.5000 mg | INHALATION_SOLUTION | RESPIRATORY_TRACT | Status: DC | PRN

## 2019-02-28 MED ORDER — PANTOPRAZOLE SODIUM 40 MG IV SOLR
40.0000 mg | Freq: Once | INTRAVENOUS | Status: AC
  Administered 2019-02-28: 40 mg via INTRAVENOUS
  Filled 2019-02-28: qty 40

## 2019-02-28 NOTE — H&P (Addendum)
[facility] History and Physical  Angela Dixon [id number] DOB: [birth year] DOA: 02/28/2019  Referring physician: ED  PCP: Unk Pinto, MD   Chief Complaint:  nausea and vomiting  HPI: Angela Dixon is a 33 y.o. female with a history of alcohol abuse, cannabis abuse, GERD, hyperlipidemia, history of cyclic vomiting presented to hospital with worsening nausea and vomiting for the last 10 days.  Patient stated that she drank 2 bottles of wine almost 11 days back and started having worsening nausea and vomiting.  Nausea and vomiting mostly worse in the morning.  Over the last few days it has gotten worse.  She smokes marijuana daily and tried hot showers but did not quite improve her symptoms.  Patient reported her vomiting is associated with alcohol.  She did complain of some epigastric pain but no hematemesis or melena.  Denies any fever, chest pain, cough, sputum production, chills, shortness of breath, palpitation or dizziness.  Denies syncope.  Denies urinary urgency, frequency or dysuria.  Denies sick contacts or recent travel.    ED Course: In the ED, patient was afebrile but was noted to have elevated WBC at 14 and serum potassium was low at 2.5.  Patient was then considered for observation in the hospital for intractable nausea and vomiting.  CT scan of the abdomen done 2 days back was negative for acute findings.  Review of Systems:  All systems were reviewed and were negative unless otherwise mentioned in the HPI  Past Medical History:  Diagnosis Date  . Abnormal Pap smear   . Acid reflux   . Anemia   . Chlamydia 2006  . Fracture   . Hypercholesterolemia   . Vitamin D deficiency    Past Surgical History:  Procedure Laterality Date  . COLPOSCOPY  2006  . WISDOM TOOTH EXTRACTION      Social History:  reports that she quit smoking about 12 years ago. Her smoking use included cigarettes. She smoked 1.00 pack per day. She has never used smokeless tobacco. She reports  current alcohol use of about 30.0 standard drinks of alcohol per week. She reports that she does not use drugs.  Allergies  Allergen Reactions  . Neomycin Rash    Family History  Problem Relation Age of Onset  . Hyperlipidemia Mother   . Asthma Sister   . Hypertension Maternal Grandmother   . Diabetes Maternal Grandmother   . Heart disease Maternal Grandfather   . Cancer Paternal Grandmother        throat  . Cancer Paternal Grandfather        lung     Prior to Admission medications   Medication Sig Start Date End Date Taking? Authorizing Provider  azelastine (ASTELIN) 0.1 % nasal spray SPRAY 1 SPRAY INTO BOTH NOSTRILS TWICE DAILY Patient taking differently: Place 1 spray into both nostrils 2 (two) times daily as needed for rhinitis.  03/01/18  Yes Unk Pinto, MD  cetirizine (ZYRTEC) 10 MG tablet Take 10 mg by mouth daily.   Yes [provider]  Cholecalciferol 4000 UNITS CAPS Take 4,000 Units by mouth daily.    Yes [provider]  Flaxseed, Linseed, (FLAX SEED OIL) 1000 MG CAPS Take 1,000 mg by mouth daily.    Yes [provider]  hyoscyamine (LEVSIN SL) 0.125 MG SL tablet Take 1 to 2 tablets 3 to 4 x day if needed for Nausea, vomiting, cramping or diarrhea Patient taking differently: Take 0.125-0.25 mg by mouth every 6 (six) hours as needed for  cramping (nausea, vomiting or diarrhea).  10/22/18  Yes Liane Comber, NP  ibuprofen (ADVIL,MOTRIN) 200 MG tablet Take 400 mg by mouth every 6 (six) hours as needed for moderate pain.   Yes [provider]  Magnesium 500 MG CAPS Take 500 mg by mouth daily.    Yes [provider]  methocarbamol (ROBAXIN-750) 750 MG tablet Take 1 tablet (750 mg total) by mouth every 8 (eight) hours as needed for muscle spasms. 09/19/17  Yes Liane Comber, NP  Multiple Vitamin (MULTIVITAMIN WITH MINERALS) TABS tablet Take 1 tablet by mouth daily.   Yes [provider]  naltrexone (DEPADE) 50 MG  tablet Take 1 tablet (50 mg total) by mouth daily. 01/27/19  Yes [name], PA-C  omeprazole (PRILOSEC) 20 MG capsule Take 1 capsule Daily for Indigestion & Acid Reflux Patient taking differently: Take 20 mg by mouth daily.  01/27/19  Yes Unk Pinto, MD  ondansetron (ZOFRAN ODT) 8 MG disintegrating tablet Take 1 tablet (8 mg total) by mouth every 8 (eight) hours as needed for nausea or vomiting. 02/24/19  Yes Dorie Rank, MD  pantoprazole (PROTONIX) 20 MG tablet Take 1 tablet (20 mg total) by mouth daily. 02/24/19  Yes Dorie Rank, MD  potassium chloride (KLOR-CON) 10 MEQ tablet Take 1 tablet (10 mEq total) by mouth 2 (two) times daily. 02/24/19  Yes Dorie Rank, MD  promethazine (PHENERGAN) 25 MG suppository Place 1 suppository (25 mg total) rectally every 6 (six) hours as needed for nausea or vomiting. 10/08/18  Yes Liane Comber, NP  sucralfate (CARAFATE) 1 g tablet Take 1 tablet (1 g total) by mouth 4 (four) times daily -  with meals and at bedtime for 14 days. Patient taking differently: Take 1 g by mouth 4 (four) times daily as needed. ulcers 05/10/18 02/28/19 Yes Curatolo, Adam, DO  venlafaxine XR (EFFEXOR-XR) 150 MG 24 hr capsule Take 1 capsule (150 mg total) by mouth daily with breakfast. 08/25/18  Yes [name], PA-C  albuterol (PROVENTIL HFA;VENTOLIN HFA) 108 (90 Base) MCG/ACT inhaler Inhale 2 puffs into the lungs every 4 hours as needed for wheezing or shortness of breath. Patient taking differently: Inhale 2 puffs into the lungs every 4 (four) hours as needed for wheezing or shortness of breath.  01/16/17   Unk Pinto, MD    Physical Exam: Vitals:   02/28/19 0430 02/28/19 0500 02/28/19 0635 02/28/19 0700  BP: 138/72 (!) 149/83 138/79 130/80  Pulse: 76 83 81 80  Resp: 19 20 18 13   Temp:      TempSrc:      SpO2: 100% 100% 100% 100%  Weight:      Height:       Wt Readings from Last 3 Encounters:  02/28/19 97.5 kg  02/22/19 99.8 kg  02/20/19 99.8 kg   Body  mass index is 36.9 kg/m.  General: Morbidly obese, not in obvious distress, mildly anxious HENT: Normocephalic, pupils equally reacting to light and accommodation.  No scleral pallor or icterus noted. Oral mucosa is moist.  Chest:  Clear breath sounds.  Diminished breath sounds bilaterally. No crackles or wheezes.  CVS: S1 &S2 heard. No murmur.  Regular rate and rhythm. Abdomen: Soft, mild tenderness over the epigastric region, nondistended.  Bowel sounds are heard.  Liver is not palpable, no abdominal mass palpated Extremities: No cyanosis, clubbing or edema.  Peripheral pulses are palpable. Psych: Alert, awake and oriented, normal mood CNS:  No cranial nerve deficits.  Power equal in all extremities.  No cerebellar signs.   Skin: Warm and dry.  No rashes noted.  Labs on Admission:   CBC: Recent Labs  Lab 02/21/19 1315 02/24/19 1157 02/28/19 0359  WBC 12.5* 10.6* 14.0*  NEUTROABS 9.6* 8.6*  --   HGB 14.8 13.9 14.5  HCT 43.9 41.0 43.4  MCV 85.6 85.8 86.3  PLT 346 320 [id number]    Basic Metabolic Panel: Recent Labs  Lab 02/21/19 1315 02/24/19 1157 02/28/19 0359  NA 139 138 138  K 3.0* 2.8* 2.5*  CL 99 101 101  CO2 22 22 21*  GLUCOSE 88 111* 115*  BUN 11 6 5*  CREATININE 0.80 0.60 0.68  CALCIUM 9.3 8.9 9.7    Liver Function Tests: Recent Labs  Lab 02/21/19 1315 02/24/19 1157 02/28/19 0359  AST 23 21 21   ALT 19 19 19   ALKPHOS 55 56 52  BILITOT 0.8 0.4 0.6  PROT 7.6 7.4 7.9  ALBUMIN 4.6 4.2 4.8   Recent Labs  Lab 02/21/19 1315 02/24/19 1157 02/28/19 0359  LIPASE 16 24 21    No results for input(s): AMMONIA in the last 168 hours.  Cardiac Enzymes: No results for input(s): CKTOTAL, CKMB, CKMBINDEX, TROPONINI in the last 168 hours.  BNP (last 3 results) No results for input(s): BNP in the last 8760 hours.  ProBNP (last 3 results) No results for input(s): PROBNP in the last 8760 hours.  CBG: No results for input(s): GLUCAP in the last 168 hours.   Lipase     Component Value Date/Time   LIPASE 21 02/28/2019 0359     Urinalysis    Component Value Date/Time   COLORURINE YELLOW 02/28/2019 0545   APPEARANCEUR CLEAR 02/28/2019 0545   LABSPEC 1.010 02/28/2019 0545   PHURINE 7.0 02/28/2019 0545   GLUCOSEU NEGATIVE 02/28/2019 0545   HGBUR SMALL (A) 02/28/2019 0545   BILIRUBINUR NEGATIVE 02/28/2019 0545   KETONESUR 20 (A) 02/28/2019 0545   PROTEINUR NEGATIVE 02/28/2019 0545   UROBILINOGEN 0.2 12/09/2013 1226   NITRITE NEGATIVE 02/28/2019 0545   LEUKOCYTESUR NEGATIVE 02/28/2019 0545     Drugs of Abuse  No results found for: LABOPIA, COCAINSCRNUR, LABBENZ, AMPHETMU, THCU, LABBARB    Radiological Exams on Admission: No results found.  EKG: Personally reviewed by me which shows normal sinus rhythm.  Assessment/Plan Principal Problem:   Intractable nausea and vomiting Active Problems:   Depression   Generalized anxiety disorder   GERD (gastroesophageal reflux disease)   Excessive drinking of alcohol   Intractable nausea and vomiting, history of cyclic vomiting.  Triggered by alcohol.  Patient was counseled about alcohol and marijuana.  Continue on IV antiemetics, IV fluids and supportive care.  Continue adequate hydration.  CT scan of the abdomen 2 days back was negative.  Keep patient n.p.o. except meds.  Mild volume depletion from nausea vomiting.  Continue with IV fluid hydration.  Continue D5 half-normal saline with potassium.  Severe hypokalemia.  Received IV potassium in the ED.  We will continue normal saline with IV potassium..  Magnesium levels in a.m.  History of depression and general anxiety disorder.  Continue Effexor.  History of GERD.  Continue Protonix and sucralfate.  Leukocytosis likely reactive.  Check CBC in a.m.   DVT Prophylaxis: Lovenox  Consultant: None  Code Status: Full code  Microbiology none  Antibiotics: None  Family Communication:  Patients' condition and plan of care including  tests being ordered have been discussed with the patient  who indicate understanding and agree with the plan.  Disposition  Plan: Home  Severity of Illness: The appropriate patient status for this patient is OBSERVATION. Observation status is judged to be reasonable and necessary in order to provide the required intensity of service to ensure the patient's safety. The patient's presenting symptoms, physical exam findings, and initial radiographic and laboratory data in the context of their medical condition is felt to place them at decreased risk for further clinical deterioration. Furthermore, it is anticipated that the patient will be medically stable for discharge from the hospital within 2 midnights of admission. The following factors support the patient status of observation.  Signed, Flora Lipps, MD [facility] 02/28/2019

## 2019-02-28 NOTE — ED Provider Notes (Signed)
[facility] DEPT Provider Note   CSN: [id number] Arrival date & time: 02/28/19  [id number]     History   Chief Complaint Chief Complaint  Patient presents with  . Emesis  . Nausea    HPI Angela Dixon is a 33 y.o. female with a hx of alcohol abuse, GERD presents to the Emergency Department complaining of gradual, persistent, progressively worsening nausea and vomiting for the last 10 days after drinking 2 bottles of wine.  Patient reports emesis occurs mostly in the morning.  She denies continued alcohol usage over the last week.  Patient does report she is a daily marijuana smoker.  She has tried hot showers which have improved her pain but did not seem to work Midwife.  Patient reports her episodes of cyclic vomiting are always associated with alcohol.  She reports some epigastric abdominal pain but denies that it is severe.  She reports emesis is nonbloody and nonbilious.  She reports some days are better than others but she is unable to tolerate solid foods.  Patient denies fever, chills, headache, neck pain, chest pain, shortness of breath, weakness, dizziness, syncope.     The history is provided by medical records and the patient. No language interpreter was used.    Past Medical History:  Diagnosis Date  . Abnormal Pap smear   . Acid reflux   . Anemia   . Chlamydia 2006  . Fracture   . Hypercholesterolemia   . Vitamin D deficiency     Patient Active Problem List   Diagnosis Date Noted  . Excessive drinking of alcohol 09/29/2018  . Stress and adjustment reaction 09/29/2018  . Nausea & vomiting 05/12/2018  . Diarrhea 05/12/2018  . GERD (gastroesophageal reflux disease) 01/09/2017  . Medication management 01/31/2015  . Depression 02/08/2014  . Generalized anxiety disorder 02/08/2014  . Vitamin D deficiency     Past Surgical History:  Procedure Laterality Date  . COLPOSCOPY  2006  . WISDOM TOOTH EXTRACTION       OB History    Gravida   2   Para  1   Term  1   Preterm      AB      Living  1     SAB      TAB      Ectopic      Multiple      Live Births  1            Home Medications    Prior to Admission medications   Medication Sig Start Date End Date Taking? Authorizing Provider  azelastine (ASTELIN) 0.1 % nasal spray SPRAY 1 SPRAY INTO BOTH NOSTRILS TWICE DAILY Patient taking differently: Place 1 spray into both nostrils 2 (two) times daily as needed for rhinitis.  03/01/18  Yes Unk Pinto, MD  cetirizine (ZYRTEC) 10 MG tablet Take 10 mg by mouth daily.   Yes [provider]  Cholecalciferol 4000 UNITS CAPS Take 4,000 Units by mouth daily.    Yes [provider]  Flaxseed, Linseed, (FLAX SEED OIL) 1000 MG CAPS Take 1,000 mg by mouth daily.    Yes [provider]  hyoscyamine (LEVSIN SL) 0.125 MG SL tablet Take 1 to 2 tablets 3 to 4 x day if needed for Nausea, vomiting, cramping or diarrhea Patient taking differently: Take 0.125-0.25 mg by mouth every 6 (six) hours as needed for cramping (nausea, vomiting or diarrhea).  10/22/18  Yes Liane Comber, NP  ibuprofen (ADVIL,MOTRIN) 200  MG tablet Take 400 mg by mouth every 6 (six) hours as needed for moderate pain.   Yes [provider]  Magnesium 500 MG CAPS Take 500 mg by mouth daily.    Yes [provider]  methocarbamol (ROBAXIN-750) 750 MG tablet Take 1 tablet (750 mg total) by mouth every 8 (eight) hours as needed for muscle spasms. 09/19/17  Yes Liane Comber, NP  Multiple Vitamin (MULTIVITAMIN WITH MINERALS) TABS tablet Take 1 tablet by mouth daily.   Yes [provider]  naltrexone (DEPADE) 50 MG tablet Take 1 tablet (50 mg total) by mouth daily. 01/27/19  Yes [name], PA-C  omeprazole (PRILOSEC) 20 MG capsule Take 1 capsule Daily for Indigestion & Acid Reflux Patient taking differently: Take 20 mg by mouth daily.  01/27/19  Yes Unk Pinto, MD  ondansetron (ZOFRAN ODT) 8 MG  disintegrating tablet Take 1 tablet (8 mg total) by mouth every 8 (eight) hours as needed for nausea or vomiting. 02/24/19  Yes Dorie Rank, MD  pantoprazole (PROTONIX) 20 MG tablet Take 1 tablet (20 mg total) by mouth daily. 02/24/19  Yes Dorie Rank, MD  potassium chloride (KLOR-CON) 10 MEQ tablet Take 1 tablet (10 mEq total) by mouth 2 (two) times daily. 02/24/19  Yes Dorie Rank, MD  promethazine (PHENERGAN) 25 MG suppository Place 1 suppository (25 mg total) rectally every 6 (six) hours as needed for nausea or vomiting. 10/08/18  Yes Liane Comber, NP  sucralfate (CARAFATE) 1 g tablet Take 1 tablet (1 g total) by mouth 4 (four) times daily -  with meals and at bedtime for 14 days. Patient taking differently: Take 1 g by mouth 4 (four) times daily as needed. ulcers 05/10/18 02/28/19 Yes Curatolo, Adam, DO  venlafaxine XR (EFFEXOR-XR) 150 MG 24 hr capsule Take 1 capsule (150 mg total) by mouth daily with breakfast. 08/25/18  Yes [name], PA-C  albuterol (PROVENTIL HFA;VENTOLIN HFA) 108 (90 Base) MCG/ACT inhaler Inhale 2 puffs into the lungs every 4 hours as needed for wheezing or shortness of breath. Patient taking differently: Inhale 2 puffs into the lungs every 4 (four) hours as needed for wheezing or shortness of breath.  01/16/17   Unk Pinto, MD  ondansetron (ZOFRAN) 4 MG tablet Take 1 tablet (4 mg total) by mouth every 6 (six) hours. Patient not taking: Reported on 02/28/2019 02/20/19   Nat Christen, MD  promethazine (PHENERGAN) 25 MG tablet Take 1 tablet (25 mg total) by mouth every 6 (six) hours as needed for nausea or vomiting. Patient not taking: Reported on 02/20/2019 12/29/17   Hayden Rasmussen, MD    Family History Family History  Problem Relation Age of Onset  . Hyperlipidemia Mother   . Asthma Sister   . Hypertension Maternal Grandmother   . Diabetes Maternal Grandmother   . Heart disease Maternal Grandfather   . Cancer Paternal Grandmother        throat  . Cancer  Paternal Grandfather        lung    Social History Social History   Tobacco Use  . Smoking status: Former Smoker    Packs/day: 1.00    Types: Cigarettes    Quit date: 07/09/2006    Years since quitting: 12.6  . Smokeless tobacco: Never Used  Substance Use Topics  . Alcohol use: Yes    Alcohol/week: 30.0 standard drinks    Types: 30 Standard drinks or equivalent per week  . Drug use: No     Allergies  Neomycin   Review of Systems Review of Systems  Constitutional: Positive for appetite change and fatigue. Negative for diaphoresis, fever and unexpected weight change.  HENT: Negative for mouth sores.   Eyes: Negative for visual disturbance.  Respiratory: Negative for cough, chest tightness, shortness of breath and wheezing.   Cardiovascular: Negative for chest pain.  Gastrointestinal: Positive for abdominal pain, nausea and vomiting. Negative for constipation and diarrhea.  Endocrine: Negative for polydipsia, polyphagia and polyuria.  Genitourinary: Negative for dysuria, frequency, hematuria and urgency.  Musculoskeletal: Negative for back pain and neck stiffness.  Skin: Negative for rash.  Allergic/Immunologic: Negative for immunocompromised state.  Neurological: Negative for syncope, light-headedness and headaches.  Hematological: Does not bruise/bleed easily.  Psychiatric/Behavioral: Negative for sleep disturbance. The patient is not nervous/anxious.      Physical Exam Updated Vital Signs BP (!) 165/93 (BP Location: Left Arm)   Pulse 82   Temp 98.3 F (36.8 C) (Oral)   Resp 18   Ht 5\' 4"  (1.626 m)   Wt 97.5 kg   SpO2 100%   BMI 36.90 kg/m   Physical Exam Vitals signs and nursing note reviewed.  Constitutional:      General: She is not in acute distress.    Appearance: She is not diaphoretic.     Comments: Patient uncomfortable appearing.  Actively vomiting.  HENT:     Head: Normocephalic.  Eyes:     General: No scleral icterus.    Conjunctiva/sclera:  Conjunctivae normal.  Neck:     Musculoskeletal: Normal range of motion.  Cardiovascular:     Rate and Rhythm: Normal rate and regular rhythm.     Pulses: Normal pulses.          Radial pulses are 2+ on the right side and 2+ on the left side.  Pulmonary:     Effort: No tachypnea, accessory muscle usage, prolonged expiration, respiratory distress or retractions.     Breath sounds: No stridor.     Comments: Equal chest rise. No increased work of breathing. Abdominal:     General: There is no distension.     Palpations: Abdomen is soft.     Tenderness: There is abdominal tenderness in the epigastric area. There is no right CVA tenderness, left CVA tenderness, guarding or rebound.  Musculoskeletal:     Comments: Moves all extremities equally and without difficulty.  Skin:    General: Skin is warm and dry.     Capillary Refill: Capillary refill takes less than 2 seconds.  Neurological:     Mental Status: She is alert.     GCS: GCS eye subscore is 4. GCS verbal subscore is 5. GCS motor subscore is 6.     Comments: Speech is clear and goal oriented.  Psychiatric:        Mood and Affect: Mood normal.      ED Treatments / Results  Labs (all labs ordered are listed, but only abnormal results are displayed) Labs Reviewed  COMPREHENSIVE METABOLIC PANEL - Abnormal; Notable for the following components:      Result Value   Potassium 2.5 (*)    CO2 21 (*)    Glucose, Bld 115 (*)    BUN 5 (*)    Anion gap 16 (*)    All other components within normal limits  CBC - Abnormal; Notable for the following components:   WBC 14.0 (*)    All other components within normal limits  SARS CORONAVIRUS 2 (TAT 6-24 HRS)  LIPASE,  BLOOD  URINALYSIS, ROUTINE W REFLEX MICROSCOPIC  I-STAT BETA HCG BLOOD, ED ([facility], [facility], [facility] ONLY)    EKG EKG Interpretation  Date/Time:  Sunday February 28 2019 05:28:08 EST Ventricular Rate:  79 PR Interval:    QRS Duration: 106 QT Interval:  423 QTC Calculation: 485  R Axis:   76 Text Interpretation: Sinus rhythm Normal ECG Baseline wander in lead(s) V3 V6 Confirmed by Veryl Speak [phone]) on 02/28/2019 5:32:39 AM   Procedures Procedures (including critical care time)  Medications Ordered in ED Medications  capsicum (ZOSTRIX) 0.075 % cream (has no administration in time range)  potassium chloride 10 mEq in 100 mL IVPB (10 mEq Intravenous New Bag/Given 02/28/19 0544)  sodium chloride flush (NS) 0.9 % injection 3 mL (3 mLs Intravenous Given 02/28/19 0402)  sodium chloride 0.9 % bolus 1,000 mL (0 mLs Intravenous Stopped 02/28/19 0542)  pantoprazole (PROTONIX) injection 40 mg (40 mg Intravenous Given 02/28/19 0429)  ondansetron (ZOFRAN) injection 4 mg (4 mg Intravenous Given 02/28/19 0428)  sterile water (preservative free) injection (1 mL  Given 02/28/19 0434)  metoCLOPramide (REGLAN) injection 10 mg (10 mg Intravenous Given 02/28/19 0542)     Initial Impression / Assessment and Plan / ED Course  I have reviewed the triage vital signs and the nursing notes.  Pertinent labs & imaging results that were available during my care of the patient were reviewed by me and considered in my medical decision making (see chart for details).  Clinical Course as of Feb 27 609  Nancy Fetter Feb 28, 2019  0444 Patient continues to vomit in the room   [HM]  0532 Significant hypokalemia.  Patient continues to vomit.  Unable to give oral.  Will start with IV potassium  Potassium(!!): 2.5 [HM]  0532 Increasing leukocytosis throughout the week  WBC(!): 14.0 [HM]  0607 Discussed with Dr. Marlowe Sax who will admit.   [HM]    Clinical Course User Index [HM] Autym Siess, Jarrett [place], [place]        Patient presents with nausea and vomiting for greater than 1 week.  She has been seen multiple times here in the emergency department for the same.  CT scan 3 days ago is unremarkable.  No previous surgeries.  Highly doubt bowel obstruction.  Gallbladder was normal on last CT.  On exam  today she has no rebound or guarding.  Labs show severe hypokalemia.  Patient continues to vomit despite multiple treatments.  She will need admission.  Final Clinical Impressions(s) / ED Diagnoses   Final diagnoses:  Cyclic vomiting syndrome  Hypokalemia    ED Discharge Orders    None       Agapito Games 02/28/19 [id number]    Veryl Speak, MD 03/01/19 [phone]

## 2019-02-28 NOTE — ED Notes (Signed)
I have just given report to Manati­, RN on [address]. Will transport shortly.

## 2019-02-28 NOTE — ED Notes (Signed)
CRITICAL VALUE STICKER  CRITICAL VALUE: K+ 2.5  RECEIVER (on-site recipient of call): Maylon Cos T RN  DATE & TIME NOTIFIED: 02/28/19 516a  MESSENGER (representative from lab): Blanch Media  PROVIDER NOTIFIED: Barbee Cough., PA  TIME OF NOTIFICATION: [phone]  RESPONSE: see orders

## 2019-02-28 NOTE — ED Triage Notes (Signed)
Pt arrived via [facility] with complaints of n/v for a few weeks and upper back pain. Has been to the ED a couple of times but has been feeling bad since 1am. Pt states the hyperemesis is usually brought on by ETOH but has not had anything to drink today.  BP 162/100 HR 94 RR 18 Temp 97.7 SpO2 98%  CBG 120

## 2019-03-01 DIAGNOSIS — E876 Hypokalemia: Secondary | ICD-10-CM | POA: Diagnosis present

## 2019-03-01 DIAGNOSIS — F1010 Alcohol abuse, uncomplicated: Secondary | ICD-10-CM | POA: Diagnosis present

## 2019-03-01 DIAGNOSIS — Z8249 Family history of ischemic heart disease and other diseases of the circulatory system: Secondary | ICD-10-CM | POA: Diagnosis not present

## 2019-03-01 DIAGNOSIS — Z881 Allergy status to other antibiotic agents status: Secondary | ICD-10-CM | POA: Diagnosis not present

## 2019-03-01 DIAGNOSIS — F411 Generalized anxiety disorder: Secondary | ICD-10-CM | POA: Diagnosis present

## 2019-03-01 DIAGNOSIS — Z801 Family history of malignant neoplasm of trachea, bronchus and lung: Secondary | ICD-10-CM | POA: Diagnosis not present

## 2019-03-01 DIAGNOSIS — Z833 Family history of diabetes mellitus: Secondary | ICD-10-CM | POA: Diagnosis not present

## 2019-03-01 DIAGNOSIS — Z20828 Contact with and (suspected) exposure to other viral communicable diseases: Secondary | ICD-10-CM | POA: Diagnosis present

## 2019-03-01 DIAGNOSIS — R112 Nausea with vomiting, unspecified: Secondary | ICD-10-CM | POA: Diagnosis not present

## 2019-03-01 DIAGNOSIS — Z8349 Family history of other endocrine, nutritional and metabolic diseases: Secondary | ICD-10-CM | POA: Diagnosis not present

## 2019-03-01 DIAGNOSIS — E7800 Pure hypercholesterolemia, unspecified: Secondary | ICD-10-CM | POA: Diagnosis present

## 2019-03-01 DIAGNOSIS — Z800 Family history of malignant neoplasm of digestive organs: Secondary | ICD-10-CM | POA: Diagnosis not present

## 2019-03-01 DIAGNOSIS — E559 Vitamin D deficiency, unspecified: Secondary | ICD-10-CM | POA: Diagnosis present

## 2019-03-01 DIAGNOSIS — Z87891 Personal history of nicotine dependence: Secondary | ICD-10-CM | POA: Diagnosis not present

## 2019-03-01 DIAGNOSIS — K219 Gastro-esophageal reflux disease without esophagitis: Secondary | ICD-10-CM | POA: Diagnosis present

## 2019-03-01 DIAGNOSIS — F329 Major depressive disorder, single episode, unspecified: Secondary | ICD-10-CM | POA: Diagnosis present

## 2019-03-01 DIAGNOSIS — E785 Hyperlipidemia, unspecified: Secondary | ICD-10-CM | POA: Diagnosis present

## 2019-03-01 DIAGNOSIS — R1115 Cyclical vomiting syndrome unrelated to migraine: Secondary | ICD-10-CM | POA: Diagnosis present

## 2019-03-01 LAB — COMPREHENSIVE METABOLIC PANEL
ALT: 13 U/L (ref 0–44)
AST: 15 U/L (ref 15–41)
Albumin: 3.5 g/dL (ref 3.5–5.0)
Alkaline Phosphatase: 39 U/L (ref 38–126)
Anion gap: 9 (ref 5–15)
BUN: <5 mg/dL — ABNORMAL LOW (ref 6–20)
CO2: 21 mmol/L — ABNORMAL LOW (ref 22–32)
Calcium: 8.4 mg/dL — ABNORMAL LOW (ref 8.9–10.3)
Chloride: 109 mmol/L (ref 98–111)
Creatinine, Ser: 0.62 mg/dL (ref 0.44–1.00)
GFR calc Af Amer: >60 mL/min (ref >60)
GFR calc non Af Amer: >60 mL/min (ref >60)
Glucose, Bld: 98 mg/dL (ref 70–99)
Potassium: 3 mmol/L — ABNORMAL LOW (ref 3.5–5.1)
Sodium: 139 mmol/L (ref 135–145)
Total Bilirubin: 0.5 mg/dL (ref 0.3–1.2)
Total Protein: 5.9 g/dL — ABNORMAL LOW (ref 6.5–8.1)

## 2019-03-01 LAB — CBC
HCT: 38.2 % (ref 36.0–46.0)
Hemoglobin: 12.1 g/dL (ref 12.0–15.0)
MCH: 28.6 pg (ref 26.0–34.0)
MCHC: 31.7 g/dL (ref 30.0–36.0)
MCV: 90.3 fL (ref 80.0–100.0)
Platelets: 263 10*3/uL (ref 150–400)
RBC: 4.23 MIL/uL (ref 3.87–5.11)
RDW: 13.5 % (ref 11.5–15.5)
WBC: 6.7 10*3/uL (ref 4.0–10.5)
nRBC: 0 % (ref 0.0–0.2)

## 2019-03-01 LAB — MAGNESIUM: Magnesium: 2.1 mg/dL (ref 1.7–2.4)

## 2019-03-01 MED ORDER — POTASSIUM CHLORIDE CRYS ER 20 MEQ PO TBCR: 40.0000 meq | EXTENDED_RELEASE_TABLET | Freq: Once | ORAL | Status: DC

## 2019-03-01 MED ORDER — POTASSIUM CHLORIDE 10 MEQ/100ML IV SOLN
10.0000 meq | INTRAVENOUS | Status: AC
  Administered 2019-03-01 (×3): 10 meq via INTRAVENOUS
  Filled 2019-03-01: qty 100

## 2019-03-01 NOTE — Progress Notes (Signed)
PROGRESS NOTE    Angela Dixon  [id number] DOB: Dec 25, 1985 DOA: 02/28/2019 PCP: Unk Pinto, MD   Brief Narrative: 33 y.o. female with a history of alcohol abuse, cannabis abuse, GERD, hyperlipidemia, history of cyclic vomiting presented to hospital with worsening nausea and vomiting for the last 10 days.  Patient stated that she drank 2 bottles of wine almost 11 days back and started having worsening nausea and vomiting.  Nausea and vomiting mostly worse in the morning.  Over the last few days it has gotten worse.  She smokes marijuana daily and tried hot showers but did not quite improve her symptoms.  Patient reported her vomiting is associated with alcohol.  She did complain of some epigastric pain but no hematemesis or melena.  Denies any fever, chest pain, cough, sputum production, chills, shortness of breath, palpitation or dizziness.  Denies syncope.  Denies urinary urgency, frequency or dysuria.  Denies sick contacts or recent travel.    ED Course: In the ED, patient was afebrile but was noted to have elevated WBC at 14 and serum potassium was low at 2.5.  Patient was then considered for observation in the hospital for intractable nausea and vomiting.  CT scan of the abdomen done 2 days back was negative for acute findings  Assessment & Plan:   Principal Problem:   Intractable nausea and vomiting Active Problems:   Depression   Generalized anxiety disorder   GERD (gastroesophageal reflux disease)   Excessive drinking of alcohol   #1 intractable nausea and vomiting -patient still unable to keep anything down.  She is not tolerating p.o. intake.  She threw up all her breakfast.  She was on a clear liquid diet.  We will continue IV fluids antiemetics and Protonix. We will continue clear liquids if she can tolerate later today.  #2 hypokalemia potassium 3.0 repleted will recheck.  #3 depression and anxiety continue Effexor  #4 GERD continue Protonix and sucralfate.     Estimated body mass index is 36.78 kg/m as calculated from the following:   Height as of this encounter: 5\' 4"  (1.626 m).   Weight as of this encounter: 97.2 kg.  DVT Prophylaxis: Lovenox  Consultant: None  Code Status: Full code  Microbiology none  Antibiotics: None  Family Communication:  Patients' condition and plan of care including tests being ordered have been discussed with the patient  who indicate understanding and agree with the plan.  Disposition Plan: Home   Subjective: Anxious to go home but threw up all breakfast  Objective: Vitals:   02/28/19 1300 02/28/19 2113 03/01/19 0509 03/01/19 1359  BP: 137/89 (!) 147/89 (!) 145/95 (!) 145/92  Pulse: 86 76 69 75  Resp: 18 18 18 18   Temp: 98.6 F (37 C) 98.6 F (37 C) 98.7 F (37.1 C) 98 F (36.7 C)  TempSrc: Oral Oral Oral   SpO2: 100% 100% 100% 100%  Weight:   97.2 kg   Height:        Intake/Output Summary (Last 24 hours) at 03/01/2019 1520 Last data filed at 03/01/2019 1400 Gross per 24 hour  Intake 3813.37 ml  Output 2500 ml  Net 1313.37 ml   Filed Weights   02/28/19 0334 02/28/19 0341 03/01/19 0509  Weight: 97.5 kg 97.5 kg 97.2 kg    Examination:  General exam: Appears calm and comfortable  Respiratory system: Clear to auscultation. Respiratory effort normal. Cardiovascular system: S1 & S2 heard, RRR. No JVD, murmurs, rubs, gallops or clicks. No pedal edema. Gastrointestinal  system: Abdomen is nondistended, soft and nontender. No organomegaly or masses felt. Normal bowel sounds heard. Central nervous system: Alert and oriented. No focal neurological deficits. Extremities: Symmetric 5 x 5 power. Skin: No rashes, lesions or ulcers Psychiatry: Judgement and insight appear normal. Mood & affect appropriate.     Data Reviewed: I have personally reviewed following labs and imaging studies  CBC: Recent Labs  Lab 02/24/19 1157 02/28/19 0359 03/01/19 0316  WBC 10.6* 14.0* 6.7   NEUTROABS 8.6*  --   --   HGB 13.9 14.5 12.1  HCT 41.0 43.4 38.2  MCV 85.8 86.3 90.3  PLT 320 337 [id number]   Basic Metabolic Panel: Recent Labs  Lab 02/24/19 1157 02/28/19 0359 03/01/19 0316  NA 138 138 139  K 2.8* 2.5* 3.0*  CL 101 101 109  CO2 22 21* 21*  GLUCOSE 111* 115* 98  BUN 6 5* <5*  CREATININE 0.60 0.68 0.62  CALCIUM 8.9 9.7 8.4*  MG  --   --  2.1   GFR: Estimated Creatinine Clearance: 112.2 mL/min (by C-G formula based on SCr of 0.62 mg/dL). Liver Function Tests: Recent Labs  Lab 02/24/19 1157 02/28/19 0359 03/01/19 0316  AST 21 21 15   ALT 19 19 13   ALKPHOS 56 52 39  BILITOT 0.4 0.6 0.5  PROT 7.4 7.9 5.9*  ALBUMIN 4.2 4.8 3.5   Recent Labs  Lab 02/24/19 1157 02/28/19 0359  LIPASE 24 21   No results for input(s): AMMONIA in the last 168 hours. Coagulation Profile: No results for input(s): INR, PROTIME in the last 168 hours. Cardiac Enzymes: No results for input(s): CKTOTAL, CKMB, CKMBINDEX, TROPONINI in the last 168 hours. BNP (last 3 results) No results for input(s): PROBNP in the last 8760 hours. HbA1C: No results for input(s): HGBA1C in the last 72 hours. CBG: No results for input(s): GLUCAP in the last 168 hours. Lipid Profile: No results for input(s): CHOL, HDL, LDLCALC, TRIG, CHOLHDL, LDLDIRECT in the last 72 hours. Thyroid Function Tests: No results for input(s): TSH, T4TOTAL, FREET4, T3FREE, THYROIDAB in the last 72 hours. Anemia Panel: No results for input(s): VITAMINB12, FOLATE, FERRITIN, TIBC, IRON, RETICCTPCT in the last 72 hours. Sepsis Labs: No results for input(s): PROCALCITON, LATICACIDVEN in the last 168 hours.  Recent Results (from the past 240 hour(s))  SARS CORONAVIRUS 2 (TAT 6-24 HRS) Nasopharyngeal Nasopharyngeal Swab     Status: None   Collection Time: 02/28/19  5:45 AM   Specimen: Nasopharyngeal Swab  Result Value Ref Range Status   SARS Coronavirus 2 NEGATIVE NEGATIVE Final    Comment: (NOTE) SARS-CoV-2 target  nucleic acids are NOT DETECTED. The SARS-CoV-2 RNA is generally detectable in upper and lower respiratory specimens during the acute phase of infection. Negative results do not preclude SARS-CoV-2 infection, do not rule out co-infections with other pathogens, and should not be used as the sole basis for treatment or other patient management decisions. Negative results must be combined with clinical observations, patient history, and epidemiological information. The expected result is Negative. Fact Sheet for Patients: SugarRoll.be Fact Sheet for Healthcare Providers: https://www.[employer].com/ This test is not yet approved or cleared by the [place] FDA and  has been authorized for detection and/or diagnosis of SARS-CoV-2 by FDA under an Emergency Use Authorization (EUA). This EUA will remain  in effect (meaning this test can be used) for the duration of the COVID-19 declaration under Section 56 4(b)(1) of the Act, 21 U.S.C. section 360bbb-3(b)(1), unless the authorization is terminated or revoked  sooner. Performed at [facility], [place] [address] [place], [place] [address]          Radiology Studies: No results found.      Scheduled Meds: . capsicum   Topical BID  . cholecalciferol  4,000 Units Oral Daily  . enoxaparin (LOVENOX) injection  40 mg Subcutaneous Q24H  . loratadine  10 mg Oral Daily  . naltrexone  50 mg Oral Daily  . pantoprazole (PROTONIX) IV  40 mg Intravenous Q24H  . potassium chloride  40 mEq Oral Once  . venlafaxine XR  150 mg Oral Q breakfast   Continuous Infusions: . dextrose 5 % and 0.45 % NaCl with KCl 40 mEq/L 100 mL/hr at 02/28/19 0929     LOS: 0 days     Georgette Shell, MD [facility]  If 7PM-7AM, please contact night-coverage [email or web address] Password [email or web address] 03/01/2019, 3:20 PM

## 2019-03-02 LAB — BASIC METABOLIC PANEL
Anion gap: 11 (ref 5–15)
BUN: <5 mg/dL — ABNORMAL LOW (ref 6–20)
CO2: 20 mmol/L — ABNORMAL LOW (ref 22–32)
Calcium: 9 mg/dL (ref 8.9–10.3)
Chloride: 105 mmol/L (ref 98–111)
Creatinine, Ser: 0.61 mg/dL (ref 0.44–1.00)
GFR calc Af Amer: >60 mL/min (ref >60)
GFR calc non Af Amer: >60 mL/min (ref >60)
Glucose, Bld: 109 mg/dL — ABNORMAL HIGH (ref 70–99)
Potassium: 3.8 mmol/L (ref 3.5–5.1)
Sodium: 136 mmol/L (ref 135–145)

## 2019-03-02 LAB — MAGNESIUM: Magnesium: 2 mg/dL (ref 1.7–2.4)

## 2019-03-02 MED ORDER — ONDANSETRON HCL 4 MG PO TABS: 4.0000 mg | ORAL_TABLET | Freq: Four times a day (QID) | ORAL | 0 refills | Status: DC | PRN

## 2019-03-02 MED ORDER — CAPSAICIN 0.075 % EX CREA: TOPICAL_CREAM | Freq: Two times a day (BID) | CUTANEOUS | 0 refills | Status: DC

## 2019-03-02 NOTE — Plan of Care (Signed)
Pt was discharged home today. Instructions were reviewed with patient, and questions were answered. Pt was taken to main entrance via wheelchair by NT.  

## 2019-03-02 NOTE — Progress Notes (Signed)
Assessment and Plan:  Intractable nausea and vomiting Continue protonix, stop drinking due to gastritis/GERD, stop marjauana use, encouraged counseling  Excessive drinking of alcohol Continue protonix, stop drinking due to gastritis/GERD, stop marjauana use, encouraged counseling  Stress and adjustment reaction encouraged counseling/AA  Recurrent major depressive disorder, in partial remission ([place]) - continue medications, stress management techniques discussed, increase water, good sleep hygiene discussed, increase exercise, and increase veggies.   Gastroesophageal reflux disease, unspecified whether esophagitis present -     pantoprazole (PROTONIX) 40 MG tablet; Take 1 tablet (40 mg total) by mouth daily.    Future Appointments  Date Time Provider [place]  03/15/2019 10:30 AM Noralyn Pick, NP [facility]  05/04/2019  9:30 AM [name], PA-C [facility] None  01/27/2020 10:00 AM [name], PA-C [facility] None     HPI 33 y.o.female with history of depression, anxiety, GERD, alcohol use presents for follow up from the ER. Patient went to the ER on 02/28/19, for:  Nausea and vomiting due to excessive alcohol use, she was seen 11/14, 11/15, 11/16, and 11/18, then again 11/22 with a hospital admission for severe hypokalemia due to nausea and vomiting thought to be from alcohol and marjiuana use. She does smoke marijuana daily but has stopped. She does have an appointment with GI Dec 7th. She has been intermittently out of work since 11/12.  CT AB showed fatty liver, sacroilitis.  [place] AB negative for gallbladder  She is on zofran 8 mg 1/2 pill every 6 hours, protonix 20 mg only has a week left. Is on prilosec 20mg  occ too, not taking every day, some days too sick.  Has taken the carafate randomly.  She too ibuprofen last 3 weeks ago.    Lab Results  Component Value Date   CREATININE 0.61 03/02/2019   BUN <5 (L) 03/02/2019   NA 136 03/02/2019    K 3.8 03/02/2019   CL 105 03/02/2019   CO2 20 (L) 03/02/2019   Magnesium 2.0   Past Medical History:  Diagnosis Date  . Abnormal Pap smear   . Acid reflux   . Anemia   . Chlamydia 2006  . Fracture   . Hypercholesterolemia   . Vitamin D deficiency      Allergies  Allergen Reactions  . Neomycin Rash      Current Outpatient Medications (Respiratory):  .  albuterol (PROVENTIL HFA;VENTOLIN HFA) 108 (90 Base) MCG/ACT inhaler, Inhale 2 puffs into the lungs every 4 hours as needed for wheezing or shortness of breath. (Patient taking differently: Inhale 2 puffs into the lungs every 4 (four) hours as needed for wheezing or shortness of breath. ) .  azelastine (ASTELIN) 0.1 % nasal spray, SPRAY 1 SPRAY INTO BOTH NOSTRILS TWICE DAILY (Patient taking differently: Place 1 spray into both nostrils 2 (two) times daily as needed for rhinitis. ) .  cetirizine (ZYRTEC) 10 MG tablet, Take 10 mg by mouth daily. .  promethazine (PHENERGAN) 25 MG suppository, Place 1 suppository (25 mg total) rectally every 6 (six) hours as needed for nausea or vomiting.    Current Outpatient Medications (Other):  .  capsicum (ZOSTRIX) 0.075 % topical cream, Apply topically 2 (two) times daily. .  Cholecalciferol 4000 UNITS CAPS, Take 4,000 Units by mouth daily.  .  Flaxseed, Linseed, (FLAX SEED OIL) 1000 MG CAPS, Take 1,000 mg by mouth daily.  .  hyoscyamine (LEVSIN SL) 0.125 MG SL tablet, Take 1 to 2 tablets 3 to 4 x day if needed for  Nausea, vomiting, cramping or diarrhea (Patient taking differently: Take 0.125-0.25 mg by mouth every 6 (six) hours as needed for cramping (nausea, vomiting or diarrhea). ) .  Magnesium 500 MG CAPS, Take 500 mg by mouth daily.  .  methocarbamol (ROBAXIN-750) 750 MG tablet, Take 1 tablet (750 mg total) by mouth every 8 (eight) hours as needed for muscle spasms. .  Multiple Vitamin (MULTIVITAMIN WITH MINERALS) TABS tablet, Take 1 tablet by mouth daily. .  naltrexone (DEPADE) 50 MG  tablet, Take 1 tablet (50 mg total) by mouth daily. .  ondansetron (ZOFRAN) 4 MG tablet, Take 1 tablet (4 mg total) by mouth every 6 (six) hours as needed for nausea. .  pantoprazole (PROTONIX) 20 MG tablet, Take 1 tablet (20 mg total) by mouth daily. .  potassium chloride (KLOR-CON) 10 MEQ tablet, Take 1 tablet (10 mEq total) by mouth 2 (two) times daily. Marland Kitchen  venlafaxine XR (EFFEXOR-XR) 150 MG 24 hr capsule, Take 1 capsule (150 mg total) by mouth daily with breakfast. .  sucralfate (CARAFATE) 1 g tablet, Take 1 tablet (1 g total) by mouth 4 (four) times daily -  with meals and at bedtime for 14 days. (Patient taking differently: Take 1 g by mouth 4 (four) times daily as needed. ulcers)  ROS: all negative except above.   Physical Exam: Filed Weights   03/03/19 1531  Weight: 217 lb (98.4 kg)   BP 120/82   Pulse 74   Temp (!) 97.5 F (36.4 C)   Wt 217 lb (98.4 kg)   SpO2 97%   BMI 37.25 kg/m  General Appearance: Well nourished, in no apparent distress. Eyes: PERRLA, EOMs, conjunctiva no swelling or erythema Sinuses: No Frontal/maxillary tenderness ENT/Mouth: Ext aud canals clear, TMs without erythema, bulging. No erythema, swelling, or exudate on post pharynx.  Tonsils not swollen or erythematous. Hearing normal.  Neck: Supple, thyroid normal.  Respiratory: Respiratory effort normal, BS equal bilaterally without rales, rhonchi, wheezing or stridor.  Cardio: RRR with no MRGs. Brisk peripheral pulses without edema.  Abdomen: Soft, + BS.  Non tender, no guarding, rebound, hernias, masses. Lymphatics: Non tender without lymphadenopathy.  Musculoskeletal: Full ROM, 5/5 strength, normal gait.  Skin: Warm, dry without rashes, lesions, ecchymosis.  Neuro: Cranial nerves intact. Normal muscle tone, no cerebellar symptoms. Sensation intact.  Psych: Awake and oriented X 3, normal affect, Insight and Judgment appropriate.     [name], PA-C 3:50 PM [facility]

## 2019-03-02 NOTE — Progress Notes (Signed)
Patient's potassium was 3.0 on 11/23, supplement given , paged MD on call to ask for order for labs to be redrawn, patient would like to know new labs results. No order in chart for labs to be drawn as of yet. Dawson Bills, RN

## 2019-03-02 NOTE — Discharge Summary (Signed)
Physician Discharge Summary  Angela Dixon [id number] DOB: 1985/09/22 DOA: 02/28/2019  PCP: Unk Pinto, MD  Admit date: 02/28/2019 Discharge date: 03/02/2019  Admitted From: Home Discharge home Recommendations for Outpatient Follow-up:  1. Follow up with PCP in 1-2 weeks 2. Please obtain BMP/CBC in one week 3. Please follow up on the following pending results:  Home Health: None Equipment/Devices: None  Discharge Condition stable CODE STATUS full code Diet recommendation: Cardiac Brief/Interim Summary:  33 y.o.femalewith a history of alcohol abuse, cannabis abuse, GERD, hyperlipidemia, history of cyclic vomiting presented to hospital with worsening nausea and vomiting for the last 10 days. Patient stated that she drank 2 bottles of wine almost 11 days back and started having worsening nausea and vomiting. Nausea and vomiting mostly worse in the morning. Over the last few days it has gotten worse. She smokes marijuana daily and tried hot showers but did not quite improve her symptoms. Patient reported her vomiting is associated with alcohol. She did complain of some epigastric pain but no hematemesis or melena. Denies any fever, chest pain, cough, sputum production,chills, shortness of breath, palpitation or dizziness. Denies syncope. Denies urinary urgency, frequency or dysuria. Denies sick contacts or recent travel.   ED Course:In the ED, patient was afebrile but was noted to have elevated WBC at 14and serumpotassium was low at 2.5. Patient was then considered for observation in the hospitalfor intractable nausea and vomiting.CT scan of the abdomen done 2 days back was negative for acute findings  Discharge Diagnoses:  Principal Problem:   Intractable nausea and vomiting Active Problems:   Depression   Generalized anxiety disorder   GERD (gastroesophageal reflux disease)   Excessive drinking of alcohol   #1 intractable nausea and vomiting -secondary  to alcohol and THC use.  Patient was treated with IV fluids n.p.o. supportive treatment with Zofran.  Her symptoms improved and she was able to tolerate a regular diet prior to discharge.   #2 hypokalemia potassium 3.8  upon discharge  #3 depression and anxiety continue Effexor  #4 GERD continue Protonix Prilosec and sucralfate  Estimated body mass index is 36.78 kg/m as calculated from the following:   Height as of this encounter: 5\' 4"  (1.626 m).   Weight as of this encounter: 97.2 kg.  Discharge Instructions  Discharge Instructions    Call MD for:  difficulty breathing, headache or visual disturbances   Complete by: As directed    Call MD for:  persistant nausea and vomiting   Complete by: As directed    Call MD for:  temperature >100.4   Complete by: As directed    Diet - low sodium heart healthy   Complete by: As directed    Increase activity slowly   Complete by: As directed      Allergies as of 03/02/2019      Reactions   Neomycin Rash      Medication List    STOP taking these medications   ondansetron 8 MG disintegrating tablet Commonly known as: Zofran ODT     TAKE these medications   albuterol 108 (90 Base) MCG/ACT inhaler Commonly known as: VENTOLIN HFA Inhale 2 puffs into the lungs every 4 hours as needed for wheezing or shortness of breath. What changed: See the new instructions.   azelastine 0.1 % nasal spray Commonly known as: ASTELIN SPRAY 1 SPRAY INTO BOTH NOSTRILS TWICE DAILY What changed: See the new instructions.   capsicum 0.075 % topical cream Commonly known as: ZOSTRIX Apply topically 2 (two)  times daily.   cetirizine 10 MG tablet Commonly known as: ZYRTEC Take 10 mg by mouth daily.   Cholecalciferol 100 MCG (4000 UT) Caps Take 4,000 Units by mouth daily.   Flax Seed Oil 1000 MG Caps Take 1,000 mg by mouth daily.   hyoscyamine 0.125 MG SL tablet Commonly known as: LEVSIN SL Take 1 to 2 tablets 3 to 4 x day if needed for  Nausea, vomiting, cramping or diarrhea What changed:   how much to take  how to take this  when to take this  reasons to take this  additional instructions   ibuprofen 200 MG tablet Commonly known as: ADVIL Take 400 mg by mouth every 6 (six) hours as needed for moderate pain.   Magnesium 500 MG Caps Take 500 mg by mouth daily.   methocarbamol 750 MG tablet Commonly known as: Robaxin-750 Take 1 tablet (750 mg total) by mouth every 8 (eight) hours as needed for muscle spasms.   multivitamin with minerals Tabs tablet Take 1 tablet by mouth daily.   naltrexone 50 MG tablet Commonly known as: DEPADE Take 1 tablet (50 mg total) by mouth daily.   omeprazole 20 MG capsule Commonly known as: PRILOSEC Take 1 capsule Daily for Indigestion & Acid Reflux What changed:   how much to take  how to take this  when to take this  additional instructions   ondansetron 4 MG tablet Commonly known as: ZOFRAN Take 1 tablet (4 mg total) by mouth every 6 (six) hours as needed for nausea.   pantoprazole 20 MG tablet Commonly known as: PROTONIX Take 1 tablet (20 mg total) by mouth daily.   potassium chloride 10 MEQ tablet Commonly known as: KLOR-CON Take 1 tablet (10 mEq total) by mouth 2 (two) times daily.   promethazine 25 MG suppository Commonly known as: PHENERGAN Place 1 suppository (25 mg total) rectally every 6 (six) hours as needed for nausea or vomiting.   sucralfate 1 g tablet Commonly known as: Carafate Take 1 tablet (1 g total) by mouth 4 (four) times daily -  with meals and at bedtime for 14 days. What changed:   when to take this  reasons to take this  additional instructions   venlafaxine XR 150 MG 24 hr capsule Commonly known as: EFFEXOR-XR Take 1 capsule (150 mg total) by mouth daily with breakfast.      Follow-up Information    Unk Pinto, MD Follow up.   Specialty: Internal Medicine Contact information: [address] [place] [address] [phone]          Allergies  Allergen Reactions  . Neomycin Rash    Consultations:  None   Procedures/Studies: Ct Abdomen Pelvis W Contrast  Result Date: 02/24/2019 CLINICAL DATA:  33 year old female with abdominal pain, nausea vomiting. Negative pregnancy test. EXAM: CT ABDOMEN AND PELVIS WITH CONTRAST TECHNIQUE: Multidetector CT imaging of the abdomen and pelvis was performed using the standard protocol following bolus administration of intravenous contrast. CONTRAST:  186mL OMNIPAQUE IOHEXOL 300 MG/ML  SOLN COMPARISON:  Abdominal ultrasound dated 10/21/2018. FINDINGS: Lower chest: Minimal bibasilar dependent atelectasis. The visualized lung bases are otherwise clear. No intra-abdominal free air or free fluid. Hepatobiliary: Probable mild fatty infiltration of the liver. Subcentimeter left hepatic hypodense focus is too small to characterize. No intrahepatic biliary ductal dilatation. The gallbladder is unremarkable. Pancreas: Unremarkable. No pancreatic ductal dilatation or surrounding inflammatory changes. Spleen: Normal in size without focal abnormality. Adrenals/Urinary Tract: The adrenal glands are unremarkable.  There is no hydronephrosis on either side. The visualized ureters and urinary bladder appear unremarkable. Stomach/Bowel: There is no bowel obstruction or active inflammation. The appendix is normal. Vascular/Lymphatic: The abdominal aorta and IVC are unremarkable. No portal venous gas. There is no adenopathy. Reproductive: The uterus is anteverted. An intrauterine device is noted. Evaluation of the IUD is limited on CT. The IUD however appears rotated in relation to the axis of the uterus. Further evaluation with pelvic ultrasound is recommended to assess for positioning of the IUD and exclude myometrial penetration. The ovaries are grossly unremarkable. Other: Small fat containing umbilical hernia. Musculoskeletal: Chronic bilateral sacroiliitis,  right greater left. No acute osseous pathology. IMPRESSION: 1. No acute intra-abdominal or pelvic pathology. No bowel obstruction or active inflammation. Normal appendix. 2. Rotated appearance of the IUD in relation to the axis of the uterus. Further evaluation with pelvic ultrasound is recommended to assess for positioning of the IUD and exclude myometrial penetration. 3. Chronic bilateral sacroiliitis, right greater left. Electronically Signed   By: Anner Crete M.D.   On: 02/24/2019 14:37    (Echo, Carotid, EGD, Colonoscopy, ERCP)    Subjective:  Patient resting in bed able to tolerate lunch today anxious to go home Discharge Exam: Vitals:   03/02/19 0513 03/02/19 1340  BP: (!) 152/87 130/88  Pulse: 71 75  Resp: 18   Temp: 98.4 F (36.9 C) 98.1 F (36.7 C)  SpO2: 100% 100%   Vitals:   03/01/19 1359 03/01/19 2052 03/02/19 0513 03/02/19 1340  BP: (!) 145/92 137/87 (!) 152/87 130/88  Pulse: 75 70 71 75  Resp: 18 18 18    Temp: 98 F (36.7 C) 98.1 F (36.7 C) 98.4 F (36.9 C) 98.1 F (36.7 C)  TempSrc:  Oral Oral Oral  SpO2: 100% 100% 100% 100%  Weight:      Height:        General: Pt is alert, awake, not in acute distress Cardiovascular: RRR, S1/S2 +, no rubs, no gallops Respiratory: CTA bilaterally, no wheezing, no rhonchi Abdominal: Soft, NT, ND, bowel sounds + Extremities: no edema, no cyanosis    The results of significant diagnostics from this hospitalization (including imaging, microbiology, ancillary and laboratory) are listed below for reference.     Microbiology: Recent Results (from the past 240 hour(s))  SARS CORONAVIRUS 2 (TAT 6-24 HRS) Nasopharyngeal Nasopharyngeal Swab     Status: None   Collection Time: 02/28/19  5:45 AM   Specimen: Nasopharyngeal Swab  Result Value Ref Range Status   SARS Coronavirus 2 NEGATIVE NEGATIVE Final    Comment: (NOTE) SARS-CoV-2 target nucleic acids are NOT DETECTED. The SARS-CoV-2 RNA is generally detectable in  upper and lower respiratory specimens during the acute phase of infection. Negative results do not preclude SARS-CoV-2 infection, do not rule out co-infections with other pathogens, and should not be used as the sole basis for treatment or other patient management decisions. Negative results must be combined with clinical observations, patient history, and epidemiological information. The expected result is Negative. Fact Sheet for Patients: SugarRoll.be Fact Sheet for Healthcare Providers: https://www.woods-[employer].com/ This test is not yet approved or cleared by the [place] FDA and  has been authorized for detection and/or diagnosis of SARS-CoV-2 by FDA under an Emergency Use Authorization (EUA). This EUA will remain  in effect (meaning this test can be used) for the duration of the COVID-19 declaration under Section 56 4(b)(1) of the Act, 21 U.S.C. section 360bbb-3(b)(1), unless the authorization is terminated or revoked sooner.  Performed at [facility], [place] [address], [place], [place] [address]      Labs: BNP (last 3 results) No results for input(s): BNP in the last 8760 hours. Basic Metabolic Panel: Recent Labs  Lab 02/24/19 1157 02/28/19 0359 03/01/19 0316 03/02/19 0734  NA 138 138 139 136  K 2.8* 2.5* 3.0* 3.8  CL 101 101 109 105  CO2 22 21* 21* 20*  GLUCOSE 111* 115* 98 109*  BUN 6 5* <5* <5*  CREATININE 0.60 0.68 0.62 0.61  CALCIUM 8.9 9.7 8.4* 9.0  MG  --   --  2.1 2.0   Liver Function Tests: Recent Labs  Lab 02/24/19 1157 02/28/19 0359 03/01/19 0316  AST 21 21 15   ALT 19 19 13   ALKPHOS 56 52 39  BILITOT 0.4 0.6 0.5  PROT 7.4 7.9 5.9*  ALBUMIN 4.2 4.8 3.5   Recent Labs  Lab 02/24/19 1157 02/28/19 0359  LIPASE 24 21   No results for input(s): AMMONIA in the last 168 hours. CBC: Recent Labs  Lab 02/24/19 1157 02/28/19 0359 03/01/19 0316  WBC 10.6* 14.0* 6.7  NEUTROABS 8.6*  --   --    HGB 13.9 14.5 12.1  HCT 41.0 43.4 38.2  MCV 85.8 86.3 90.3  PLT 320 337 263   Cardiac Enzymes: No results for input(s): CKTOTAL, CKMB, CKMBINDEX, TROPONINI in the last 168 hours. BNP: Invalid input(s): POCBNP CBG: No results for input(s): GLUCAP in the last 168 hours. D-Dimer No results for input(s): DDIMER in the last 72 hours. Hgb A1c No results for input(s): HGBA1C in the last 72 hours. Lipid Profile No results for input(s): CHOL, HDL, LDLCALC, TRIG, CHOLHDL, LDLDIRECT in the last 72 hours. Thyroid function studies No results for input(s): TSH, T4TOTAL, T3FREE, THYROIDAB in the last 72 hours.  Invalid input(s): FREET3 Anemia work up No results for input(s): VITAMINB12, FOLATE, FERRITIN, TIBC, IRON, RETICCTPCT in the last 72 hours. Urinalysis    Component Value Date/Time   COLORURINE YELLOW 02/28/2019 [place] 02/28/2019 0545   LABSPEC 1.010 02/28/2019 0545   PHURINE 7.0 02/28/2019 0545   GLUCOSEU NEGATIVE 02/28/2019 0545   HGBUR SMALL (A) 02/28/2019 0545   BILIRUBINUR NEGATIVE 02/28/2019 0545   KETONESUR 20 (A) 02/28/2019 0545   PROTEINUR NEGATIVE 02/28/2019 0545   UROBILINOGEN 0.2 12/09/2013 1226   NITRITE NEGATIVE 02/28/2019 0545   LEUKOCYTESUR NEGATIVE 02/28/2019 0545   Sepsis Labs Invalid input(s): PROCALCITONIN,  WBC,  LACTICIDVEN Microbiology Recent Results (from the past 240 hour(s))  SARS CORONAVIRUS 2 (TAT 6-24 HRS) Nasopharyngeal Nasopharyngeal Swab     Status: None   Collection Time: 02/28/19  5:45 AM   Specimen: Nasopharyngeal Swab  Result Value Ref Range Status   SARS Coronavirus 2 NEGATIVE NEGATIVE Final    Comment: (NOTE) SARS-CoV-2 target nucleic acids are NOT DETECTED. The SARS-CoV-2 RNA is generally detectable in upper and lower respiratory specimens during the acute phase of infection. Negative results do not preclude SARS-CoV-2 infection, do not rule out co-infections with other pathogens, and should not be used as  the sole basis for treatment or other patient management decisions. Negative results must be combined with clinical observations, patient history, and epidemiological information. The expected result is Negative. Fact Sheet for Patients: SugarRoll.be Fact Sheet for Healthcare Providers: https://www.woods-[employer].com/ This test is not yet approved or cleared by the [place] FDA and  has been authorized for detection and/or diagnosis of SARS-CoV-2 by FDA under an Emergency Use Authorization (EUA). This  EUA will remain  in effect (meaning this test can be used) for the duration of the COVID-19 declaration under Section 56 4(b)(1) of the Act, 21 U.S.C. section 360bbb-3(b)(1), unless the authorization is terminated or revoked sooner. Performed at [facility], [place] [address], [place], [place] [address]      Time coordinating discharge:  39 minutes  SIGNED:   Georgette Shell, MD  [facility] 03/02/2019, 3:11 PM Pager   If 7PM-7AM, please contact night-coverage [email or web address] Password [email or web address]

## 2019-03-03 ENCOUNTER — Ambulatory Visit: Payer: BC Managed Care – PPO | Admitting: Physician Assistant

## 2019-03-03 ENCOUNTER — Other Ambulatory Visit: Payer: Self-pay

## 2019-03-03 ENCOUNTER — Encounter: Payer: Self-pay | Admitting: Physician Assistant

## 2019-03-03 VITALS — BP 120/82 | HR 74 | Temp 97.5°F | Wt 217.0 lb

## 2019-03-03 DIAGNOSIS — K219 Gastro-esophageal reflux disease without esophagitis: Secondary | ICD-10-CM

## 2019-03-03 DIAGNOSIS — R112 Nausea with vomiting, unspecified: Secondary | ICD-10-CM

## 2019-03-03 DIAGNOSIS — F4329 Adjustment disorder with other symptoms: Secondary | ICD-10-CM

## 2019-03-03 DIAGNOSIS — F1010 Alcohol abuse, uncomplicated: Secondary | ICD-10-CM

## 2019-03-03 DIAGNOSIS — F3341 Major depressive disorder, recurrent, in partial remission: Secondary | ICD-10-CM

## 2019-03-03 MED ORDER — PANTOPRAZOLE SODIUM 40 MG PO TBEC: 40.0000 mg | DELAYED_RELEASE_TABLET | Freq: Every day | ORAL | 1 refills | Status: DC

## 2019-03-03 NOTE — Patient Instructions (Addendum)
Counseling services   I suggest calling your insurance and finding out who is in your network and THEN calling those people or looking them up on google.   I'm a big fan of Cognitive Behavioral Therapy, look this up on You tube or check with the therapist you see if they are certified.  This form of therapy helps to teach you skills to better handle with current situation that are causing anxiety or depression.   There are some great apps too Check out [place], give thanks app.  Meditations apps are great like headspace.    Food Choices for Gastroesophageal Reflux Disease, Adult When you have gastroesophageal reflux disease (GERD), the foods you eat and your eating habits are very important. Choosing the right foods can help ease the discomfort of GERD. Consider working with a diet and nutrition specialist (dietitian) to help you make healthy food choices. What general guidelines should I follow?  Eating plan  Choose healthy foods low in fat, such as fruits, vegetables, whole grains, low-fat dairy products, and lean meat, fish, and poultry.  Eat frequent, small meals instead of three large meals each day. Eat your meals slowly, in a relaxed setting. Avoid bending over or lying down until 2-3 hours after eating.  Limit high-fat foods such as fatty meats or fried foods.  Limit your intake of oils, butter, and shortening to less than 8 teaspoons each day.  Avoid the following: ? Foods that cause symptoms. These may be different for different people. Keep a food diary to keep track of foods that cause symptoms. ? Alcohol. ? Drinking large amounts of liquid with meals. ? Eating meals during the 2-3 hours before bed.  Cook foods using methods other than frying. This may include baking, grilling, or broiling. Lifestyle  Maintain a healthy weight. Ask your health care provider what weight is healthy for you. If you need to lose weight, work with your health care provider to do so  safely.  Exercise for at least 30 minutes on 5 or more days each week, or as told by your health care provider.  Avoid wearing clothes that fit tightly around your waist and chest.  Do not use any products that contain nicotine or tobacco, such as cigarettes and e-cigarettes. If you need help quitting, ask your health care provider.  Sleep with the head of your bed raised. Use a wedge under the mattress or blocks under the bed frame to raise the head of the bed. What foods are not recommended? The items listed may not be a complete list. Talk with your dietitian about what dietary choices are best for you. Grains Pastries or quick breads with added fat. [place] toast. Vegetables Deep fried vegetables. [place] fries. Any vegetables prepared with added fat. Any vegetables that cause symptoms. For some people this may include tomatoes and tomato products, chili peppers, onions and garlic, and horseradish. Fruits Any fruits prepared with added fat. Any fruits that cause symptoms. For some people this may include citrus fruits, such as oranges, grapefruit, pineapple, and lemons. Meats and other protein foods High-fat meats, such as fatty beef or pork, hot dogs, ribs, ham, sausage, salami and bacon. Fried meat or protein, including fried fish and fried chicken. Nuts and nut butters. Dairy Whole milk and chocolate milk. Sour cream. Cream. Ice cream. Cream cheese. Milk shakes. Beverages Coffee and tea, with or without caffeine. Carbonated beverages. Sodas. Energy drinks. Fruit juice made with acidic fruits (such as orange or grapefruit). Tomato juice. Alcoholic  drinks. Fats and oils Butter. Margarine. Shortening. Ghee. Sweets and desserts Chocolate and cocoa. Donuts. Seasoning and other foods Pepper. Peppermint and spearmint. Any condiments, herbs, or seasonings that cause symptoms. For some people, this may include curry, hot sauce, or vinegar-based salad dressings. Summary  When you have  gastroesophageal reflux disease (GERD), food and lifestyle choices are very important to help ease the discomfort of GERD.  Eat frequent, small meals instead of three large meals each day. Eat your meals slowly, in a relaxed setting. Avoid bending over or lying down until 2-3 hours after eating.  Limit high-fat foods such as fatty meat or fried foods. This information is not intended to replace advice given to you by your health care provider. Make sure you discuss any questions you have with your health care provider. Document Released: 03/25/2005 Document Revised: 07/16/2018 Document Reviewed: 03/26/2016 Elsevier Patient Education  2020 [place].    Potassium Content of Foods  The body needs potassium to control blood pressure and to keep the muscles and nervous system healthy. Here are some healthy foods below that are high in potassium. Also you can get the white label salt of "NO SALT" salt substitute, 1/4 teaspoon of this is equivalent to 16meq potassium.   FOODS AND DRINKS HIGH IN POTASSIUM FOODS MODERATE IN POTASSIUM   Fruits  Avocado (cubed),  c / 50 g.  Banana (sliced), 75 g.  Cantaloupe (cubed), 80 g.  Honeydew, 1 wedge / 85 g.  Kiwi (sliced), 90 g.  Nectarine, 1 small / 129 g.  Orange, 1 medium / 131 g. Vegetables  Artichoke,  of a medium / 64 g.  Asparagus (boiled), 90 g..  Broccoli (boiled), 78 g.  Brussels sprout (boiled), 78 g.  Butternut squash (baked), 103 g.  Chickpea (cooked), 82 g.  Green peas (cooked), 80 g.  Kidney beans (cooked), 5 tbsp / 55 g.  Lima beans (cooked),  c / 43 g.  Navy beans (cooked),  c / 61 g.  Spinach (cooked),  c / 45 g.  Sweet potato (baked),  c / 50 g.  Tomato (chopped or sliced), 90 g.  Vegetable juice.  White mushrooms (cooked), 78 g.  Yam (cooked or baked),  c / 34 g.  Zucchini squash (boiled), 90 g. Other Foods and Drinks  Almonds (whole),  c / 36 g.  Fish, 3 oz / 85 g.  Nonfat fruit  variety yogurt, 123 g.  Pistachio nuts, 1 oz / 28 g.  Pumpkin seeds, 1 oz / 28 g.  Red meat (broiled, cooked, grilled), 3 oz / 85 g.  Scallops (steamed), 3 oz / 85 g.  Spaghetti sauce,  c / 66 g.  Sunflower seeds (dry roasted), 1 oz / 28 g.  Veggie burger, 1 patty / 70 g. Fruits  Grapefruit,  of the fruit / [id number] g  Plums (sliced), 83 g.  Tangerine, 1 large / 120 g. Vegetables  Carrots (boiled), 78 g.  Carrots (sliced), 61 g.  Rhubarb (cooked with sugar), 120 g.  Rutabaga (cooked), 120 g.  Yellow snap beans (cooked), 63 g. Other Foods and Drinks   Chicken breast (roasted and chopped),  c / 70 g.  Pita bread, 1 large / 64 g.  Shrimp (steamed), 4 oz / 113 g.  Swiss cheese (diced), 70 g.

## 2019-03-13 NOTE — Progress Notes (Signed)
03/13/2019 [name] [id number] [birth year]   HISTORY OF PRESENT ILLNESS: Angela Dixon is a 33 year old female with a past medical history of anxiety, derpression, alcohol abuse on Naltrexone, cannabis abuse, GERD and cyclic vomiting.  Past wisdom teeth extraction and colposcopy.  She presented to [facility]  02/20/2019 with nausea, vomiting and generalized body aches after drinking excessive amounts of alcohol. Her potassium level was 3.1. BUN 31. Cr. 1.13. WBC 17.9. Hg 16.2. HCT 47.7. She received IV fluids, Protonix and Zofran with improvement. She was discharged home on Pantoprazole 40mg  QD and Ondansetron 4mg  Q 6 hours PRN. She presented back to [facility] 02/28/2019 with persistent nausea and vomiting. She reported drinking 2 bottles of wine on 02/27/2019. Her potassium level was 2.5 and WBC 14. An abdominal/pelvic CT was negative. She received IV KCL and fluids and Zofran. Her symptoms improved and she was discharged home on 11/24.  She presents today for further GI evaluation.  She has a little nausea in the mornings which is progressively improved.  No further vomiting for the past week.  She has frequent heartburn.  No dysphagia.  She reports taking Omeprazole for the past 5 years which was recently switched to Pantoprazole 40 mg daily.  Decreased appetite.  She reports feeling full quickly.  She has intermittent stomach hunger pains.  She denies having any alcohol since 03/02/2019.  She previously drank 1-1/2 bottles of wine daily for 5 years then reduced her alcohol intake to 3 hard seltzers daily over the past 6 months or so.  She began drinking more wine in November as reported above.  She denies ever attending inpatient or outpatient alcohol rehabilitation.  No marijuana since 02/26/2019.  She quit smoking cigarettes 11 years ago.  She reports having chronic loose mud-like stools 3-4 episodes daily.  No rectal bleeding or black stool.  Since she is stopped drinking  alcohol, she is no longer having loose stools.  She reports having intermittent constipation.  She is passing small solid stools daily.  Paternal grandfather had throat or esophageal cancer.  No family history of colorectal cancer.  CBC Latest Ref Rng & Units 03/01/2019 02/28/2019 02/24/2019  WBC 4.0 - 10.5 K/uL 6.7 14.0(H) 10.6(H)  Hemoglobin 12.0 - 15.0 g/dL 12.1 14.5 13.9  Hematocrit 36.0 - 46.0 % 38.2 43.4 41.0  Platelets 150 - 400 K/uL 263 337 320    CMP Latest Ref Rng & Units 03/02/2019 03/01/2019 02/28/2019  Glucose 70 - 99 mg/dL 109(H) 98 115(H)  BUN 6 - 20 mg/dL <5(L) <5(L) 5(L)  Creatinine 0.44 - 1.00 mg/dL 0.61 0.62 0.68  Sodium 135 - 145 mmol/L 136 139 138  Potassium 3.5 - 5.1 mmol/L 3.8 3.0(L) 2.5(LL)  Chloride 98 - 111 mmol/L 105 109 101  CO2 22 - 32 mmol/L 20(L) 21(L) 21(L)  Calcium 8.9 - 10.3 mg/dL 9.0 8.4(L) 9.7  Total Protein 6.5 - 8.1 g/dL - 5.9(L) 7.9  Total Bilirubin 0.3 - 1.2 mg/dL - 0.5 0.6  Alkaline Phos 38 - 126 U/L - 39 52  AST 15 - 41 U/L - 15 21  ALT 0 - 44 U/L - 13 19   Abdominal/pelvic CT 02/24/2019: 1. No acute intra-abdominal or pelvic pathology. No bowel obstruction or active inflammation. Normal appendix. 2. Rotated appearance of the IUD in relation to the axis of the uterus. Further evaluation with pelvic ultrasound is recommended to assess for positioning of the IUD and exclude myometrial penetration. 3. Chronic bilateral sacroiliitis,  right greater left.  Abdominal sono 10/21/2018: 1. No acute ultrasound abnormality of the abdomen to explain nausea or emesis. No ultrasound abnormality of the gallbladder. 2.  Hepatic steatosis.  Past Medical History:  Diagnosis Date   Abnormal Pap smear    Acid reflux    Anemia    Chlamydia 2006   Fracture    Hypercholesterolemia    Vitamin D deficiency    Past Surgical History:  Procedure Laterality Date   COLPOSCOPY  2006   WISDOM TOOTH EXTRACTION     Current Outpatient Medications on  File Prior to Visit  Medication Sig Dispense Refill   albuterol (PROVENTIL HFA;VENTOLIN HFA) 108 (90 Base) MCG/ACT inhaler Inhale 2 puffs into the lungs every 4 hours as needed for wheezing or shortness of breath. (Patient taking differently: Inhale 2 puffs into the lungs every 4 (four) hours as needed for wheezing or shortness of breath. ) 3 Inhaler 0   azelastine (ASTELIN) 0.1 % nasal spray SPRAY 1 SPRAY INTO BOTH NOSTRILS TWICE DAILY (Patient taking differently: Place 1 spray into both nostrils 2 (two) times daily as needed for rhinitis. ) 90 mL 3   capsicum (ZOSTRIX) 0.075 % topical cream Apply topically 2 (two) times daily. (Patient taking differently: Apply topically 2 (two) times daily as needed. ) 28.3 g 0   cetirizine (ZYRTEC) 10 MG tablet Take 10 mg by mouth daily.     Cholecalciferol 4000 UNITS CAPS Take 4,000 Units by mouth daily.      Flaxseed, Linseed, (FLAX SEED OIL) 1000 MG CAPS Take 1,000 mg by mouth daily.      hyoscyamine (LEVSIN SL) 0.125 MG SL tablet Take 1 to 2 tablets 3 to 4 x day if needed for Nausea, vomiting, cramping or diarrhea (Patient taking differently: Take 0.125-0.25 mg by mouth every 6 (six) hours as needed for cramping (nausea, vomiting or diarrhea). ) 90 tablet 0   Magnesium 500 MG CAPS Take 500 mg by mouth daily.      methocarbamol (ROBAXIN-750) 750 MG tablet Take 1 tablet (750 mg total) by mouth every 8 (eight) hours as needed for muscle spasms. 60 tablet 1   Multiple Vitamin (MULTIVITAMIN WITH MINERALS) TABS tablet Take 1 tablet by mouth daily.     naltrexone (DEPADE) 50 MG tablet Take 1 tablet (50 mg total) by mouth daily. 90 tablet 0   ondansetron (ZOFRAN) 4 MG tablet Take 1 tablet (4 mg total) by mouth every 6 (six) hours as needed for nausea. 20 tablet 0   pantoprazole (PROTONIX) 40 MG tablet Take 1 tablet (40 mg total) by mouth daily. 30 tablet 1   potassium chloride (KLOR-CON) 10 MEQ tablet Take 1 tablet (10 mEq total) by mouth 2 (two) times  daily. 14 tablet 0   promethazine (PHENERGAN) 25 MG suppository Place 1 suppository (25 mg total) rectally every 6 (six) hours as needed for nausea or vomiting. 12 each 1   venlafaxine XR (EFFEXOR-XR) 150 MG 24 hr capsule Take 1 capsule (150 mg total) by mouth daily with breakfast. 90 capsule 1   No current facility-administered medications on file prior to visit.    Allergies  Allergen Reactions   Neomycin Rash   Family History  Problem Relation Age of Onset   Hyperlipidemia Mother    Asthma Sister    Hypertension Maternal Grandmother    Diabetes Maternal Grandmother    Heart disease Maternal Grandfather    Cancer Paternal Grandmother        throat  Cancer Paternal Grandfather        lung   Diverticulitis Father    Colon cancer Neg Hx    Social History   Socioeconomic History   Marital status: Married    Spouse name: Not on file   Number of children: Not on file   Years of education: Not on file   Highest education level: Not on file  Occupational History   Not on file  Social Needs   Financial resource strain: Not on file   Food insecurity    Worry: Not on file    Inability: Not on file   Transportation needs    Medical: Not on file    Non-medical: Not on file  Tobacco Use   Smoking status: Former Smoker    Packs/day: 1.00    Types: Cigarettes    Quit date: 07/09/2006    Years since quitting: 12.6   Smokeless tobacco: Never Used  Substance and Sexual Activity   Alcohol use: Not Currently    Alcohol/week: 30.0 standard drinks    Types: 30 Standard drinks or equivalent per week   Drug use: Not Currently    Types: Marijuana   Sexual activity: Not on file  Lifestyle   Physical activity    Days per week: Not on file    Minutes per session: Not on file   Stress: Not on file  Relationships   Social connections    Talks on phone: Not on file    Gets together: Not on file    Attends religious service: Not on file    Active member  of club or organization: Not on file    Attends meetings of clubs or organizations: Not on file    Relationship status: Not on file   Intimate partner violence    Fear of current or ex partner: Not on file    Emotionally abused: Not on file    Physically abused: Not on file    Forced sexual activity: Not on file  Other Topics Concern   Not on file  Social History Narrative   Not on file   She is separated. She has 2 sons.   REVIEW OF SYSTEMS  : All other systems reviewed and negative except where noted in the History of Present Illness.   PHYSICAL EXAM: BP 102/62    Pulse 100    Temp 97.6 F (36.4 C)    Ht 5' 4.5" (1.638 m)    Wt 211 lb (95.7 kg)    BMI 35.66 kg/m  General: Well developed 33 year old female in no acute distress Head: Normocephalic and atraumatic Eyes:  Sclerae anicteric,conjunctive pink. Ears: Normal auditory acuity Neck: Supple, no masses.  Mouth: Dentition intact, posterior pharynx with mild erythema Lungs: Clear throughout to auscultation Heart: Regular rate and rhythm Abdomen: Soft, nontender, non distended. No masses or hepatomegaly noted. Normal bowel sounds x 4 quads. Rectal: Deferred.  Musculoskeletal: Symmetrical with no gross deformities  Skin: No lesions on visible extremities Extremities: No edema  Neurological: Alert oriented x 4, grossly nonfocal Cervical Nodes:  No significant cervical adenopathy Psychological:  Alert and cooperative. Normal mood and affect  ASSESSMENT AND PLAN:  59. 33 year old female with nausea and vomiting associated to alcohol abuse -No alcohol -EGD benefits and risk discussed including risk with sedation, risk of bleeding, perforation infection -Continue Pantoprazole 40 mg daily.  Famotidine 20 mg 1 p.o. daily as needed. -Follow-up in the office 2 to 3 weeks after EGD  completed -Patient to call office if her symptoms worsen  2. GERD -See plan in #1  3. Alcohol abuse -No alcohol -Recommended alcoholic  Anonymous, if relapse occurs inpatient alcohol rehabilitation  4. Drug use, marijuana   5. IBS, previously diarrhea predominant now with constipation  -Benefiber 1 tablespoon once daily as needed -Miralax as needed  -Consider near future diagnostic colonoscopy, to be further discuss at the time of the patient's next follow-up appointment  6.  Hepatic steatosis -Continue to monitor weight -No alcohol       CC:  Unk Pinto, MD

## 2019-03-15 ENCOUNTER — Ambulatory Visit: Payer: BC Managed Care – PPO | Admitting: Nurse Practitioner

## 2019-03-15 ENCOUNTER — Encounter: Payer: Self-pay | Admitting: Nurse Practitioner

## 2019-03-15 VITALS — BP 102/62 | HR 100 | Temp 97.6°F | Ht 64.5 in | Wt 211.0 lb

## 2019-03-15 DIAGNOSIS — Z1159 Encounter for screening for other viral diseases: Secondary | ICD-10-CM

## 2019-03-15 DIAGNOSIS — K588 Other irritable bowel syndrome: Secondary | ICD-10-CM

## 2019-03-15 DIAGNOSIS — F1010 Alcohol abuse, uncomplicated: Secondary | ICD-10-CM

## 2019-03-15 DIAGNOSIS — R110 Nausea: Secondary | ICD-10-CM

## 2019-03-15 NOTE — Patient Instructions (Signed)
If you are age 34 or older, your body mass index should be between 23-30. Your Body mass index is 35.66 kg/m. If this is out of the aforementioned range listed, please consider follow up with your Primary Care Provider.  If you are age 63 or younger, your body mass index should be between 19-25. Your Body mass index is 35.66 kg/m. If this is out of the aformentioned range listed, please consider follow up with your Primary Care Provider.   You have been scheduled for an endoscopy. Please follow written instructions given to you at your visit today. If you use inhalers (even only as needed), please bring them with you on the day of your procedure.  You may take over the counter Pepcid 20 mg 1 tablet daily as needed Continue with taking your Pantoprazole 40 mg in the morning  No alcohol or marijuana advised. Advised to seek treatment for alcohol through [facility].  Follow up in the office 2 weeks after your EGD is complete.  Thank you for choosing me and [facility]

## 2019-03-16 NOTE — Progress Notes (Signed)
Reviewed and agree with management plan.  Brita Jurgensen T. Storm Sovine, MD FACG [facility]  

## 2019-03-17 ENCOUNTER — Other Ambulatory Visit: Payer: Self-pay

## 2019-03-17 MED ORDER — VENLAFAXINE HCL ER 150 MG PO CP24: 150.0000 mg | ORAL_CAPSULE | Freq: Every day | ORAL | 1 refills | Status: DC

## 2019-03-23 ENCOUNTER — Other Ambulatory Visit: Payer: Self-pay | Admitting: Gastroenterology

## 2019-03-23 ENCOUNTER — Ambulatory Visit (INDEPENDENT_AMBULATORY_CARE_PROVIDER_SITE_OTHER): Payer: BC Managed Care – PPO

## 2019-03-23 DIAGNOSIS — Z1159 Encounter for screening for other viral diseases: Secondary | ICD-10-CM

## 2019-03-23 LAB — SARS CORONAVIRUS 2 (TAT 6-24 HRS): SARS Coronavirus 2: NEGATIVE

## 2019-03-24 ENCOUNTER — Encounter: Payer: Self-pay | Admitting: Gastroenterology

## 2019-03-25 ENCOUNTER — Ambulatory Visit (AMBULATORY_SURGERY_CENTER): Payer: BC Managed Care – PPO | Admitting: Gastroenterology

## 2019-03-25 ENCOUNTER — Encounter: Payer: Self-pay | Admitting: Gastroenterology

## 2019-03-25 ENCOUNTER — Other Ambulatory Visit: Payer: Self-pay

## 2019-03-25 VITALS — BP 106/65 | HR 74 | Temp 99.1°F | Resp 9 | Ht 64.0 in | Wt 211.0 lb

## 2019-03-25 DIAGNOSIS — K2950 Unspecified chronic gastritis without bleeding: Secondary | ICD-10-CM

## 2019-03-25 DIAGNOSIS — K2100 Gastro-esophageal reflux disease with esophagitis, without bleeding: Secondary | ICD-10-CM

## 2019-03-25 DIAGNOSIS — K449 Diaphragmatic hernia without obstruction or gangrene: Secondary | ICD-10-CM | POA: Diagnosis not present

## 2019-03-25 DIAGNOSIS — R112 Nausea with vomiting, unspecified: Secondary | ICD-10-CM | POA: Diagnosis not present

## 2019-03-25 MED ORDER — SODIUM CHLORIDE 0.9 % IV SOLN: 500.0000 mL | Freq: Once | INTRAVENOUS | Status: DC

## 2019-03-25 NOTE — Progress Notes (Signed)
Called to room to assist during endoscopic procedure.  Patient ID and intended procedure confirmed with present staff. Received instructions for my participation in the procedure from the performing physician.  

## 2019-03-25 NOTE — Op Note (Signed)
[place] Patient Name: Angela Dixon Procedure Date: 03/25/2019 9:42 AM MRN: [id number] Endoscopist: Ladene Artist , MD Age: 33 Referring MD:  Date of Birth: 1985/05/16 Gender: Female Account #: [id number] Procedure:                Upper GI endoscopy Indications:              Nausea with vomiting Medicines:                Monitored Anesthesia Care Procedure:                Pre-Anesthesia Assessment:                           - Prior to the procedure, a History and Physical                            was performed, and patient medications and                            allergies were reviewed. The patient's tolerance of                            previous anesthesia was also reviewed. The risks                            and benefits of the procedure and the sedation                            options and risks were discussed with the patient.                            All questions were answered, and informed consent                            was obtained. Prior Anticoagulants: The patient has                            taken no previous anticoagulant or antiplatelet                            agents. ASA Grade Assessment: II - A patient with                            mild systemic disease. After reviewing the risks                            and benefits, the patient was deemed in                            satisfactory condition to undergo the procedure.                           After obtaining informed consent, the endoscope was  passed under direct vision. Throughout the                            procedure, the patient's blood pressure, pulse, and                            oxygen saturations were monitored continuously. The                            Endoscope was introduced through the mouth, and                            advanced to the second part of duodenum. The upper                            GI endoscopy was accomplished  without difficulty.                            The patient tolerated the procedure well. Scope In: Scope Out: Findings:                 LA Grade B (one or more mucosal breaks greater than                            5 mm, not extending between the tops of two mucosal                            folds) esophagitis with no bleeding was found at                            the gastroesophageal junction.                           The exam of the esophagus was otherwise normal.                           Patchy mildly erythematous mucosa without bleeding                            was found in the gastric body and in the gastric                            antrum. Biopsies were taken with a cold forceps for                            histology.                           A small hiatal hernia was present.                           The exam of the stomach was otherwise normal.  The duodenal bulb and second portion of the                            duodenum were normal. Complications:            No immediate complications. Estimated Blood Loss:     Estimated blood loss was minimal. Impression:               - LA Grade B reflux esophagitis with no bleeding.                           - Erythematous mucosa in the gastric body and                            antrum. Biopsied.                           - Small hiatal hernia.                           - Normal duodenal bulb and second portion of the                            duodenum. Recommendation:           - Patient has a contact number available for                            emergencies. The signs and symptoms of potential                            delayed complications were discussed with the                            patient. Return to normal activities tomorrow.                            Written discharge instructions were provided to the                            patient.                           - Resume previous  diet.                           - Follow antireflux measures long term.                           - Continue present medications.                           - Await pathology results.                           - Esophagitis from GERD or vomiting. Ladene Artist, MD 03/25/2019 10:06:38 AM This report has been signed electronically.

## 2019-03-25 NOTE — Progress Notes (Signed)
Pt tolerated well. VSS. Awake and to recovery. 

## 2019-03-25 NOTE — Progress Notes (Signed)
Temp taken LC VS taken by CW

## 2019-03-25 NOTE — Patient Instructions (Signed)
Handouts given for Hiatal Hernia and Esophagitis.  YOU HAD AN ENDOSCOPIC PROCEDURE TODAY AT [facility]:   Refer to the procedure report that was given to you for any specific questions about what was found during the examination.  If the procedure report does not answer your questions, please call your gastroenterologist to clarify.  If you requested that your care partner not be given the details of your procedure findings, then the procedure report has been included in a sealed envelope for you to review at your convenience later.  YOU SHOULD EXPECT: Some feelings of bloating in the abdomen. Passage of more gas than usual.  Walking can help get rid of the air that was put into your GI tract during the procedure and reduce the bloating. If you had a lower endoscopy (such as a colonoscopy or flexible sigmoidoscopy) you may notice spotting of blood in your stool or on the toilet paper. If you underwent a bowel prep for your procedure, you may not have a normal bowel movement for a few days.  Please Note:  You might notice some irritation and congestion in your nose or some drainage.  This is from the oxygen used during your procedure.  There is no need for concern and it should clear up in a day or so.  SYMPTOMS TO REPORT IMMEDIATELY:   Following upper endoscopy (EGD)  Vomiting of blood or coffee ground material  New chest pain or pain under the shoulder blades  Painful or persistently difficult swallowing  New shortness of breath  Fever of 100F or higher  Black, tarry-looking stools  For urgent or emergent issues, a gastroenterologist can be reached at any hour by calling [phone].   DIET:  We do recommend a small meal at first, but then you may proceed to your regular diet.  Drink plenty of fluids but you should avoid alcoholic beverages for 24 hours.  ACTIVITY:  You should plan to take it easy for the rest of today and you should NOT DRIVE or use heavy machinery  until tomorrow (because of the sedation medicines used during the test).    FOLLOW UP: Our staff will call the number listed on your records 48-72 hours following your procedure to check on you and address any questions or concerns that you may have regarding the information given to you following your procedure. If we do not reach you, we will leave a message.  We will attempt to reach you two times.  During this call, we will ask if you have developed any symptoms of COVID 19. If you develop any symptoms (ie: fever, flu-like symptoms, shortness of breath, cough etc.) before then, please call [phone].  If you test positive for Covid 19 in the 2 weeks post procedure, please call and report this information to [place].    If any biopsies were taken you will be contacted by phone or by letter within the next 1-3 weeks.  Please call us at [phone] if you have not heard about the biopsies in 3 weeks.    SIGNATURES/CONFIDENTIALITY: You and/or your care partner have signed paperwork which will be entered into your electronic medical record.  These signatures attest to the fact that that the information above on your After Visit Summary has been reviewed and is understood.  Full responsibility of the confidentiality of this discharge information lies with you and/or your care-partner.

## 2019-03-27 ENCOUNTER — Other Ambulatory Visit: Payer: Self-pay | Admitting: Physician Assistant

## 2019-03-29 ENCOUNTER — Telehealth: Payer: Self-pay

## 2019-03-29 NOTE — Telephone Encounter (Signed)
  Follow up Call-  Call back number 03/25/2019  Post procedure Call Back phone  # [phone]  Permission to leave phone message Yes  Some recent data might be hidden     Patient questions:  Do you have a fever, pain , or abdominal swelling? No. Pain Score  0 *  Have you tolerated food without any problems? Yes.    Have you been able to return to your normal activities? Yes.    Do you have any questions about your discharge instructions: Diet   No. Medications  No. Follow up visit  No.  Do you have questions or concerns about your Care? No.  Actions: * If pain score is 4 or above: No action needed, pain <4. 1. Have you developed a fever since your procedure? no  2.   Have you had an respiratory symptoms (SOB or cough) since your procedure? no  3.   Have you tested positive for COVID 19 since your procedure no  4.   Have you had any family members/close contacts diagnosed with the COVID 19 since your procedure?  no   If yes to any of these questions please route to Joylene John, RN and Alphonsa Gin, Therapist, sports.

## 2019-03-29 NOTE — Telephone Encounter (Signed)
  Follow up Call-  Call back number 03/25/2019  Post procedure Call Back phone  # [phone]  Permission to leave phone message Yes  Some recent data might be hidden     Left message

## 2019-03-31 ENCOUNTER — Encounter: Payer: Self-pay | Admitting: Gastroenterology

## 2019-04-05 ENCOUNTER — Encounter: Payer: Self-pay | Admitting: Adult Health

## 2019-04-05 ENCOUNTER — Other Ambulatory Visit: Payer: Self-pay

## 2019-04-05 ENCOUNTER — Ambulatory Visit: Payer: BC Managed Care – PPO | Admitting: Adult Health

## 2019-04-05 VITALS — BP 112/74 | HR 79 | Temp 96.4°F | Wt 220.8 lb

## 2019-04-05 DIAGNOSIS — L03113 Cellulitis of right upper limb: Secondary | ICD-10-CM

## 2019-04-05 DIAGNOSIS — T22199A Burn of first degree of multiple sites of unspecified shoulder and upper limb, except wrist and hand, initial encounter: Secondary | ICD-10-CM

## 2019-04-05 MED ORDER — DOXYCYCLINE HYCLATE 100 MG PO CAPS: ORAL_CAPSULE | ORAL | 0 refills | Status: DC

## 2019-04-05 NOTE — Patient Instructions (Signed)
     Doxycycline tablets or capsules What is this medicine? DOXYCYCLINE (dox i SYE kleen) is a tetracycline antibiotic. It kills certain bacteria or stops their growth. It is used to treat many kinds of infections, like dental, skin, respiratory, and urinary tract infections. It also treats acne, Lyme disease, malaria, and certain sexually transmitted infections. This medicine may be used for other purposes; ask your health care provider or pharmacist if you have questions. COMMON BRAND NAME(S): Acticlate, Adoxa, Adoxa CK, Adoxa Pak, Adoxa TT, Alodox, Avidoxy, Doxal, LYMEPAK, Mondoxyne NL, Monodox, Morgidox 1x, Morgidox 1x Kit, Morgidox 2x, Morgidox 2x Kit, NutriDox, Ocudox, TARGADOX, Vibra-Tabs, Vibramycin What should I tell my health care provider before I take this medicine? They need to know if you have any of these conditions:  liver disease  long exposure to sunlight like working outdoors  stomach problems like colitis  an unusual or allergic reaction to doxycycline, tetracycline antibiotics, other medicines, foods, dyes, or preservatives  pregnant or trying to get pregnant  breast-feeding How should I use this medicine? Take this medicine by mouth with a full glass of water. Follow the directions on the prescription label. It is best to take this medicine without food, but if it upsets your stomach take it with food. Take your medicine at regular intervals. Do not take your medicine more often than directed. Take all of your medicine as directed even if you think you are better. Do not skip doses or stop your medicine early. Talk to your pediatrician regarding the use of this medicine in children. While this drug may be prescribed for selected conditions, precautions do apply. Overdosage: If you think you have taken too much of this medicine contact a poison control center or emergency room at once. NOTE: This medicine is only for you. Do not share this medicine with  others. What if I miss a dose? If you miss a dose, take it as soon as you can. If it is almost time for your next dose, take only that dose. Do not take double or extra doses. What may interact with this medicine?  antacids  barbiturates  birth control pills  bismuth subsalicylate  carbamazepine  methoxyflurane  other antibiotics  phenytoin  vitamins that contain iron  warfarin This list may not describe all possible interactions. Give your health care provider a list of all the medicines, herbs, non-prescription drugs, or dietary supplements you use. Also tell them if you smoke, drink alcohol, or use illegal drugs. Some items may interact with your medicine. What should I watch for while using this medicine? Tell your doctor or health care professional if your symptoms do not improve. Do not treat diarrhea with over the counter products. Contact your doctor if you have diarrhea that lasts more than 2 days or if it is severe and watery. Do not take this medicine just before going to bed. It may not dissolve properly when you lay down and can cause pain in your throat. Drink plenty of fluids while taking this medicine to also help reduce irritation in your throat. This medicine can make you more sensitive to the sun. Keep out of the sun. If you cannot avoid being in the sun, wear protective clothing and use sunscreen. Do not use sun lamps or tanning beds/booths. Birth control pills may not work properly while you are taking this medicine. Talk to your doctor about using an extra method of birth control. If you are being treated for a sexually transmitted infection,   avoid sexual contact until you have finished your treatment. Your sexual partner may also need treatment. Avoid antacids, aluminum, calcium, magnesium, and iron products for 4 hours before and 2 hours after taking a dose of this medicine. If you are using this medicine to prevent malaria, you should still protect yourself  from contact with mosquitos. Stay in screened-in areas, use mosquito nets, keep your body covered, and use an insect repellent. What side effects may I notice from receiving this medicine? Side effects that you should report to your doctor or health care professional as soon as possible:  allergic reactions like skin rash, itching or hives, swelling of the face, lips, or tongue  difficulty breathing  fever  itching in the rectal or genital area  pain on swallowing  rash, fever, and swollen lymph nodes  redness, blistering, peeling or loosening of the skin, including inside the mouth  severe stomach pain or cramps  unusual bleeding or bruising  unusually weak or tired  yellowing of the eyes or skin Side effects that usually do not require medical attention (report to your doctor or health care professional if they continue or are bothersome):  diarrhea  loss of appetite  nausea, vomiting This list may not describe all possible side effects. Call your doctor for medical advice about side effects. You may report side effects to FDA at 1-800-FDA-1088. Where should I keep my medicine? Keep out of the reach of children. Store at room temperature, below 30 degrees C (86 degrees F). Protect from light. Keep container tightly closed. Throw away any unused medicine after the expiration date. Taking this medicine after the expiration date can make you seriously ill. NOTE: This sheet is a summary. It may not cover all possible information. If you have questions about this medicine, talk to your doctor, pharmacist, or health care provider.  2020 Elsevier/Gold Standard (2018-06-25 13:44:53)  
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
## 2019-04-28 ENCOUNTER — Ambulatory Visit: Payer: BC Managed Care – PPO | Attending: Internal Medicine

## 2019-04-28 DIAGNOSIS — Z20822 Contact with and (suspected) exposure to covid-19: Secondary | ICD-10-CM

## 2019-04-29 LAB — NOVEL CORONAVIRUS, NAA: SARS-CoV-2, NAA: NOT DETECTED
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
## 2019-05-04 ENCOUNTER — Ambulatory Visit: Payer: BC Managed Care – PPO | Admitting: Physician Assistant

## 2019-05-05 ENCOUNTER — Encounter: Payer: Self-pay | Admitting: Physician Assistant

## 2019-05-05 ENCOUNTER — Ambulatory Visit: Payer: BC Managed Care – PPO | Admitting: Physician Assistant

## 2019-05-05 ENCOUNTER — Other Ambulatory Visit: Payer: Self-pay

## 2019-05-05 VITALS — BP 120/70 | HR 101 | Temp 97.3°F | Wt 224.0 lb

## 2019-05-05 DIAGNOSIS — F411 Generalized anxiety disorder: Secondary | ICD-10-CM

## 2019-05-05 DIAGNOSIS — E559 Vitamin D deficiency, unspecified: Secondary | ICD-10-CM

## 2019-05-05 DIAGNOSIS — F1010 Alcohol abuse, uncomplicated: Secondary | ICD-10-CM

## 2019-05-05 DIAGNOSIS — M542 Cervicalgia: Secondary | ICD-10-CM

## 2019-05-05 DIAGNOSIS — F3341 Major depressive disorder, recurrent, in partial remission: Secondary | ICD-10-CM | POA: Diagnosis not present

## 2019-05-05 DIAGNOSIS — Z79899 Other long term (current) drug therapy: Secondary | ICD-10-CM

## 2019-05-05 DIAGNOSIS — E782 Mixed hyperlipidemia: Secondary | ICD-10-CM | POA: Diagnosis not present

## 2019-05-05 DIAGNOSIS — K219 Gastro-esophageal reflux disease without esophagitis: Secondary | ICD-10-CM

## 2019-05-05 MED ORDER — METHOCARBAMOL 750 MG PO TABS: 750.0000 mg | ORAL_TABLET | Freq: Three times a day (TID) | ORAL | 1 refills | Status: DC | PRN

## 2019-05-05 NOTE — Patient Instructions (Signed)
Try the exercises and other information below, the robaxin if needed at bedtime for muscle spasm. This can be taken up to every 8 hours, but causes sedation, so should not drive or operate heavy machinery while taking this medicine.   Go to the ER if you have any new weakness in your arms, trouble with your grip, worse headache ever, fever, chills. or have worsening pain.   If you are not better in 1-3 month we will refer you to ortho   Cervical Sprain A cervical sprain is a stretch or tear in one or more of the tough, cord-like tissues that connect bones (ligaments) in the neck. Cervical sprains can range from mild to severe. Severe cervical sprains can cause the spinal bones (vertebrae) in the neck to be unstable. This can lead to spinal cord damage and can result in serious nervous system problems. The amount of time that it takes for a cervical sprain to get better depends on the cause and extent of the injury. Most cervical sprains heal in 4-6 weeks. What are the causes? Cervical sprains may be caused by an injury (trauma), such as from a motor vehicle accident, a fall, or sudden forward and backward whipping movement of the head and neck (whiplash injury). Mild cervical sprains may be caused by wear and tear over time, such as from poor posture, sitting in a chair that does not provide support, or looking up or down for long periods of time. What increases the risk? The following factors may make you more likely to develop this condition:  Participating in activities that have a high risk of trauma to the neck. These include contact sports, auto racing, gymnastics, and diving.  Taking risks when driving or riding in a motor vehicle, such as speeding.  Having osteoarthritis of the spine.  Having poor strength and flexibility of the neck.  A previous neck injury.  Having poor posture.  Spending a lot of time in certain positions that put stress on the neck, such as sitting at a  computer for long periods of time.  What are the signs or symptoms? Symptoms of this condition include:  Pain, soreness, stiffness, tenderness, swelling, or a burning sensation in the front, back, or sides of the neck.  Sudden tightening of neck muscles that you cannot control (muscle spasms).  Pain in the shoulders or upper back.  Limited ability to move the neck.  Headache.  Dizziness.  Nausea.  Vomiting.  Weakness, numbness, or tingling in a hand or an arm.  Symptoms may develop right away after injury, or they may develop over a few days. In some cases, symptoms may go away with treatment and return (recur) over time. How is this diagnosed? This condition may be diagnosed based on:  Your medical history.  Your symptoms.  Any recent injuries or known neck problems that you have, such as arthritis in the neck.  A physical exam.  Imaging tests, such as: ? X-rays. ? MRI. ? CT scan.  How is this treated? This condition is treated by resting and icing the injured area and doing physical therapy exercises. Depending on the severity of your condition, treatment may also include:  Keeping your neck in place (immobilized) for periods of time. This may be done using: ? A cervical collar. This supports your chin and the back of your head. ? A cervical traction device. This is a sling that holds up your head. This removes weight and pressure from your neck, and it  may help to relieve pain.  Medicines that help to relieve pain and inflammation.  Medicines that help to relax your muscles (muscle relaxants).  Surgery. This is rare.  Follow these instructions at home: If you have a cervical collar:  Wear it as told by your health care provider. Do not remove the collar unless instructed by your health care provider.  Ask your health care provider before you make any adjustments to your collar.  If you have long hair, keep it outside of the collar.  Ask your health  care provider if you can remove the collar for cleaning and bathing. If you are allowed to remove the collar for cleaning or bathing: ? Follow instructions from your health care provider about how to remove the collar safely. ? Clean the collar by wiping it with mild soap and water and drying it completely. ? If your collar has removable pads, remove them every 1-2 days and wash them by hand with soap and water. Let them air-dry completely before you put them back in the collar. ? Check your skin under the collar for irritation or sores. If you see any, tell your health care provider. Managing pain, stiffness, and swelling  If directed, use a cervical traction device as told by your health care provider.  If directed, apply heat to the affected area before you do your physical therapy or as often as told by your health care provider. Use the heat source that your health care provider recommends, such as a moist heat pack or a heating pad. ? Place a towel between your skin and the heat source. ? Leave the heat on for 20-30 minutes. ? Remove the heat if your skin turns bright red. This is especially important if you are unable to feel pain, heat, or cold. You may have a greater risk of getting burned.  If directed, put ice on the affected area: ? Put ice in a plastic bag. ? Place a towel between your skin and the bag. ? Leave the ice on for 20 minutes, 2-3 times a day. Activity  Do not drive while wearing a cervical collar. If you do not have a cervical collar, ask your health care provider if it is safe to drive while your neck heals.  Do not drive or use heavy machinery while taking prescription pain medicine or muscle relaxants, unless your health care provider approves.  Do not lift anything that is heavier than 10 lb (4.5 kg) until your health care provider tells you that it is safe.  Rest as directed by your health care provider. Avoid positions and activities that make your symptoms  worse. Ask your health care provider what activities are safe for you.  If physical therapy was prescribed, do exercises as told by your health care provider or physical therapist. General instructions  Take over-the-counter and prescription medicines only as told by your health care provider.  Do not use any products that contain nicotine or tobacco, such as cigarettes and e-cigarettes. These can delay healing. If you need help quitting, ask your health care provider.  Keep all follow-up visits as told by your health care provider or physical therapist. This is important. How is this prevented? To prevent a cervical sprain from happening again:  Use and maintain good posture. Make any needed adjustments to your workstation to help you use good posture.  Exercise regularly as directed by your health care provider or physical therapist.  Avoid risky activities that may cause a  cervical sprain.  Contact a health care provider if:  You have symptoms that get worse or do not get better after 2 weeks of treatment.  You have pain that gets worse or does not get better with medicine.  You develop new, unexplained symptoms.  You have sores or irritated skin on your neck from wearing your cervical collar. Get help right away if:  You have severe pain.  You develop numbness, tingling, or weakness in any part of your body.  You cannot move a part of your body (you have paralysis).  You have neck pain along with: ? Severe dizziness. ? Headache. Summary  A cervical sprain is a stretch or tear in one or more of the tough, cord-like tissues that connect bones (ligaments) in the neck.  Cervical sprains may be caused by an injury (trauma), such as from a motor vehicle accident, a fall, or sudden forward and backward whipping movement of the head and neck (whiplash injury).  Symptoms may develop right away after injury, or they may develop over a few days.  This condition is treated by  resting and icing the injured area and doing physical therapy exercises. This information is not intended to replace advice given to you by your health care provider. Make sure you discuss any questions you have with your health care provider. Document Released: 01/20/2007 Document Revised: 11/22/2015 Document Reviewed: 11/22/2015 Elsevier Interactive Patient Education  2017 [employer]   Cervical Strain and Sprain Rehab Ask your health care provider which exercises are safe for you. Do exercises exactly as told by your health care provider and adjust them as directed. It is normal to feel mild stretching, pulling, tightness, or discomfort as you do these exercises, but you should stop right away if you feel sudden pain or your pain gets worse.Do not begin these exercises until told by your health care provider. Stretching and range of motion exercises These exercises warm up your muscles and joints and improve the movement and flexibility of your neck. These exercises also help to relieve pain, numbness, and tingling. Exercise A: Cervical side bend  1. Using good posture, sit on a stable chair or stand up. 2. Without moving your shoulders, slowly tilt your left / right ear to your shoulder until you feel a stretch in your neck muscles. You should be looking straight ahead. 3. Hold for __________ seconds. 4. Repeat with the other side of your neck. Repeat __________ times. Complete this exercise __________ times a day. Exercise B: Cervical rotation  1. Using good posture, sit on a stable chair or stand up. 2. Slowly turn your head to the side as if you are looking over your left / right shoulder. ? Keep your eyes level with the ground. ? Stop when you feel a stretch along the side and the back of your neck. 3. Hold for __________ seconds. 4. Repeat this by turning to your other side. Repeat __________ times. Complete this exercise __________ times a day. Exercise C: Thoracic extension  and pectoral stretch 1. Roll a towel or a small blanket so it is about 4 inches (10 cm) in diameter. 2. Lie down on your back on a firm surface. 3. Put the towel lengthwise, under your spine in the middle of your back. It should not be not under your shoulder blades. The towel should line up with your spine from your middle back to your lower back. 4. Put your hands behind your head and let your elbows fall out  to your sides. 5. Hold for __________ seconds. Repeat __________ times. Complete this exercise __________ times a day. Strengthening exercises These exercises build strength and endurance in your neck. Endurance is the ability to use your muscles for a long time, even after your muscles get tired. Exercise D: Upper cervical flexion, isometric 1. Lie on your back with a thin pillow behind your head and a small rolled-up towel under your neck. 2. Gently tuck your chin toward your chest and nod your head down to look toward your feet. Do not lift your head off the pillow. 3. Hold for __________ seconds. 4. Release the tension slowly. Relax your neck muscles completely before you repeat this exercise. Repeat __________ times. Complete this exercise __________ times a day. Exercise E: Cervical extension, isometric  1. Stand about 6 inches (15 cm) away from a wall, with your back facing the wall. 2. Place a soft object, about 6-8 inches (15-20 cm) in diameter, between the back of your head and the wall. A soft object could be a small pillow, a ball, or a folded towel. 3. Gently tilt your head back and press into the soft object. Keep your jaw and forehead relaxed. 4. Hold for __________ seconds. 5. Release the tension slowly. Relax your neck muscles completely before you repeat this exercise. Repeat __________ times. Complete this exercise __________ times a day. Posture and body mechanics  Body mechanics refers to the movements and positions of your body while you do your daily activities.  Posture is part of body mechanics. Good posture and healthy body mechanics can help to relieve stress in your body's tissues and joints. Good posture means that your spine is in its natural S-curve position (your spine is neutral), your shoulders are pulled back slightly, and your head is not tipped forward. The following are general guidelines for applying improved posture and body mechanics to your everyday activities. Standing  When standing, keep your spine neutral and keep your feet about hip-width apart. Keep a slight bend in your knees. Your ears, shoulders, and hips should line up.  When you do a task in which you stand in one place for a long time, place one foot up on a stable object that is 2-4 inches (5-10 cm) high, such as a footstool. This helps keep your spine neutral. Sitting   When sitting, keep your spine neutral and your keep feet flat on the floor. Use a footrest, if necessary, and keep your thighs parallel to the floor. Avoid rounding your shoulders, and avoid tilting your head forward.  When working at a desk or a computer, keep your desk at a height where your hands are slightly lower than your elbows. Slide your chair under your desk so you are close enough to maintain good posture.  When working at a computer, place your monitor at a height where you are looking straight ahead and you do not have to tilt your head forward or downward to look at the screen. Resting When lying down and resting, avoid positions that are most painful for you. Try to support your neck in a neutral position. You can use a contour pillow or a small rolled-up towel. Your pillow should support your neck but not push on it. This information is not intended to replace advice given to you by your health care provider. Make sure you discuss any questions you have with your health care provider. Document Released: 03/25/2005 Document Revised: 11/30/2015 Document Reviewed: 03/01/2015 Elsevier Interactive  Patient Education  2018 [employer].

## 2019-05-06 ENCOUNTER — Other Ambulatory Visit: Payer: Self-pay | Admitting: Physician Assistant

## 2019-05-06 LAB — CBC WITH DIFFERENTIAL/PLATELET
Absolute Monocytes: 547 cells/uL (ref 200–950)
Basophils Absolute: 46 cells/uL (ref 0–200)
Basophils Relative: 0.6 %
Eosinophils Absolute: 608 cells/uL — ABNORMAL HIGH (ref 15–500)
Eosinophils Relative: 7.9 %
HCT: 38.4 % (ref 35.0–45.0)
Hemoglobin: 12.5 g/dL (ref 11.7–15.5)
Lymphs Abs: 2387 cells/uL (ref 850–3900)
MCH: 28.2 pg (ref 27.0–33.0)
MCHC: 32.6 g/dL (ref 32.0–36.0)
MCV: 86.5 fL (ref 80.0–100.0)
MPV: 10.1 fL (ref 7.5–12.5)
Monocytes Relative: 7.1 %
Neutro Abs: 4112 cells/uL (ref 1500–7800)
Neutrophils Relative %: 53.4 %
Platelets: 311 10*3/uL (ref 140–400)
RBC: 4.44 10*6/uL (ref 3.80–5.10)
RDW: 12.4 % (ref 11.0–15.0)
Total Lymphocyte: 31 %
WBC: 7.7 10*3/uL (ref 3.8–10.8)

## 2019-05-06 LAB — COMPLETE METABOLIC PANEL WITH GFR
AG Ratio: 1.9 (calc) (ref 1.0–2.5)
ALT: 9 U/L (ref 6–29)
AST: 13 U/L (ref 10–30)
Albumin: 4.2 g/dL (ref 3.6–5.1)
Alkaline phosphatase (APISO): 57 U/L (ref 31–125)
BUN: 7 mg/dL (ref 7–25)
CO2: 28 mmol/L (ref 20–32)
Calcium: 9 mg/dL (ref 8.6–10.2)
Chloride: 106 mmol/L (ref 98–110)
Creat: 0.62 mg/dL (ref 0.50–1.10)
GFR, Est African American: 136 mL/min/{1.73_m2} (ref >=60)
GFR, Est Non African American: 118 mL/min/{1.73_m2} (ref >=60)
Globulin: 2.2 g/dL (calc) (ref 1.9–3.7)
Glucose, Bld: 86 mg/dL (ref 65–99)
Potassium: 4.2 mmol/L (ref 3.5–5.3)
Sodium: 140 mmol/L (ref 135–146)
Total Bilirubin: 0.3 mg/dL (ref 0.2–1.2)
Total Protein: 6.4 g/dL (ref 6.1–8.1)

## 2019-05-06 LAB — MAGNESIUM: Magnesium: 2 mg/dL (ref 1.5–2.5)

## 2019-05-06 LAB — LIPID PANEL
Cholesterol: 230 mg/dL — ABNORMAL HIGH (ref <200)
HDL: 53 mg/dL (ref >=50)
LDL Cholesterol (Calc): 144 mg/dL (calc) — ABNORMAL HIGH
Non-HDL Cholesterol (Calc): 177 mg/dL (calc) — ABNORMAL HIGH (ref <130)
Total CHOL/HDL Ratio: 4.3 (calc) (ref <5.0)
Triglycerides: 191 mg/dL — ABNORMAL HIGH (ref <150)

## 2019-05-06 LAB — VITAMIN D 25 HYDROXY (VIT D DEFICIENCY, FRACTURES): Vit D, 25-Hydroxy: 21 ng/mL — ABNORMAL LOW (ref 30–100)

## 2019-05-06 LAB — TSH: TSH: 0.85 mIU/L

## 2019-05-06 MED ORDER — VITAMIN D (ERGOCALCIFEROL) 1.25 MG (50000 UNIT) PO CAPS: ORAL_CAPSULE | ORAL | 1 refills | Status: DC

## 2019-05-27 ENCOUNTER — Other Ambulatory Visit: Payer: Self-pay | Admitting: Physician Assistant

## 2019-05-27 MED ORDER — ONDANSETRON HCL 4 MG PO TABS: 4.0000 mg | ORAL_TABLET | Freq: Every day | ORAL | 1 refills | Status: DC | PRN

## 2019-05-29 DIAGNOSIS — R109 Unspecified abdominal pain: Secondary | ICD-10-CM | POA: Diagnosis not present

## 2019-06-01 NOTE — Progress Notes (Signed)
Assessment and Plan:  Left lower quadrant abdominal pain No fever, no chills, does not appear to be infectious Some diarrhea but feels incomplete evacuation with cramping Possible constipation with overflow diarrhea Will treat for IBS-C- samples given Follow up with GI Stop ETOH completely Discussed FODMAP diet and information given -     linaclotide (LINZESS) 290 MCG CAPS capsule; Take 1 capsule (290 mcg total) by mouth daily.      Future Appointments  Date Time Provider [place]  06/29/2019  8:30 AM Ladene Artist, MD [facility]  08/03/2019 10:45 AM [name], PA-C [facility] None  01/27/2020 10:00 AM [name], PA-C [facility] None     HPI 34 y.o.female with history of depression, anxiety, GERD, alcohol use presents for CONSTIPATION  X 2 weeks.   She states for 1-2 weeks, will have AB cramping with sweating and nausea occ, will have mushy stools with yellow color, feels incomplete evacuation will happened 1-3 x in the AM and nothing later in the day.  This  She will even wake up in the night and feel she needs to have a bowel movement but she is unable to have a BM. Will have a lot of gas.  Has not had celiac panel. Did change magnesium types recently.  Has appointment with GI March 23rd.   No fever, chills. No added stress. No one sick around her.  Had antibiotics, doxy, 12/28 for cellulitis.   Nausea and vomiting due to excessive alcohol use, followed with GI and had EGD showing gastritis/esophagitis, neg H pylori. She is still on the protonix. She is drinking 1-4 drinks during the week, has stopped since these symptoms.   She feels she has had this happen before, but not this long.   CT AB 02/2019 showed fatty liver, sacroilitis.  [place] AB 10/2018 negative for gallbladder EGD 03/2019  05/2018 KUB normal  BMI is Body mass index is 37.42 kg/m., she is working on diet and exercise. Wt Readings from Last 3 Encounters:  06/02/19 218 lb (98.9  kg)  05/05/19 224 lb (101.6 kg)  04/05/19 220 lb 12.8 oz (100.2 kg)     Past Medical History:  Diagnosis Date  . Abnormal Pap smear   . Acid reflux   . Allergy   . Anemia   . Anxiety   . Chlamydia 2006  . Depression   . Fracture   . Hypercholesterolemia   . IBS (irritable bowel syndrome)   . Vitamin D deficiency      Allergies  Allergen Reactions  . Neomycin Rash      Current Outpatient Medications (Respiratory):  .  albuterol (PROVENTIL HFA;VENTOLIN HFA) 108 (90 Base) MCG/ACT inhaler, Inhale 2 puffs into the lungs every 4 hours as needed for wheezing or shortness of breath. (Patient taking differently: Inhale 2 puffs into the lungs every 4 (four) hours as needed for wheezing or shortness of breath. ) .  azelastine (ASTELIN) 0.1 % nasal spray, SPRAY 1 SPRAY INTO BOTH NOSTRILS TWICE DAILY (Patient taking differently: Place 1 spray into both nostrils 2 (two) times daily as needed for rhinitis. ) .  cetirizine (ZYRTEC) 10 MG tablet, Take 10 mg by mouth daily.    Current Outpatient Medications (Other):  Marland Kitchen  Cholecalciferol 4000 UNITS CAPS, Take 4,000 Units by mouth daily.  .  Flaxseed, Linseed, (FLAX SEED OIL) 1000 MG CAPS, Take 1,000 mg by mouth daily.  .  hyoscyamine (LEVSIN SL) 0.125 MG SL tablet, Take 1 to 2 tablets 3 to  4 x day if needed for Nausea, vomiting, cramping or diarrhea .  Magnesium 500 MG CAPS, Take 500 mg by mouth daily.  .  methocarbamol (ROBAXIN-750) 750 MG tablet, Take 1 tablet (750 mg total) by mouth every 8 (eight) hours as needed for muscle spasms. .  Multiple Vitamin (MULTIVITAMIN WITH MINERALS) TABS tablet, Take 1 tablet by mouth daily. .  naltrexone (DEPADE) 50 MG tablet, Take 1 tablet (50 mg total) by mouth daily. .  ondansetron (ZOFRAN) 4 MG tablet, Take 1 tablet (4 mg total) by mouth daily as needed for nausea or vomiting (Not sedating). .  pantoprazole (PROTONIX) 40 MG tablet, Take 1 tablet Daily for Heart burn & Indigestion .  potassium chloride  (KLOR-CON) 10 MEQ tablet, Take 1 tablet (10 mEq total) by mouth 2 (two) times daily. Marland Kitchen  venlafaxine XR (EFFEXOR-XR) 150 MG 24 hr capsule, Take 1 capsule (150 mg total) by mouth daily with breakfast. .  Vitamin D, Ergocalciferol, (DRISDOL) 1.25 MG (50000 UNIT) CAPS capsule, 1 pill 2 days a week for vitamin d deficiency .  linaclotide (LINZESS) 290 MCG CAPS capsule, Take 1 capsule (290 mcg total) by mouth daily.  ROS: all negative except above.   Physical Exam: Filed Weights   06/02/19 1043  Weight: 218 lb (98.9 kg)   BP 120/76   Pulse 82   Temp 97.8 F (36.6 C)   Wt 218 lb (98.9 kg)   SpO2 97%   BMI 37.42 kg/m  General Appearance: Well nourished, in no apparent distress. Eyes: PERRLA, EOMs, conjunctiva no swelling or erythema Sinuses: No Frontal/maxillary tenderness ENT/Mouth: Ext aud canals clear, TMs without erythema, bulging. Mouth and nose not examined- patient wearing a facemask. Hearing normal.  Neck: Supple, thyroid normal.  Respiratory: Respiratory effort normal, BS equal bilaterally without rales, rhonchi, wheezing or stridor.  Cardio: RRR with no MRGs. Brisk peripheral pulses without edema.  Abdomen: Soft, + BS.  + tenderness epigastric and LLQ, no guarding, no rebound, no hernias or masses appreciated. Lymphatics: Non tender without lymphadenopathy.  Musculoskeletal: Full ROM, 5/5 strength, normal gait.  Skin: Warm, dry without rashes, lesions, ecchymosis.  Neuro: Cranial nerves intact. Normal muscle tone, no cerebellar symptoms. Sensation intact.  Psych: Awake and oriented X 3, normal affect, Insight and Judgment appropriate.     [name], PA-C 1:39 PM [facility]

## 2019-06-02 ENCOUNTER — Encounter: Payer: Self-pay | Admitting: Physician Assistant

## 2019-06-02 ENCOUNTER — Other Ambulatory Visit: Payer: Self-pay

## 2019-06-02 ENCOUNTER — Ambulatory Visit: Payer: BC Managed Care – PPO | Admitting: Physician Assistant

## 2019-06-02 VITALS — BP 120/76 | HR 82 | Temp 97.8°F | Wt 218.0 lb

## 2019-06-02 DIAGNOSIS — R1032 Left lower quadrant pain: Secondary | ICD-10-CM | POA: Diagnosis not present

## 2019-06-02 MED ORDER — LINACLOTIDE 290 MCG PO CAPS: 290.0000 ug | ORAL_CAPSULE | Freq: Every day | ORAL | 0 refills | Status: DC

## 2019-06-02 NOTE — Patient Instructions (Addendum)
Please go to the ER if you have any severe AB pain, unable to hold down food/water, blood in stool or vomit, chest pain, shortness of breath, or any worsening symptoms.    Irritable Bowel Syndrome, Adult  Irritable bowel syndrome (IBS) is a group of symptoms that affects the organs responsible for digestion (gastrointestinal or GI tract). IBS is not one specific disease. To regulate how the GI tract works, the body sends signals back and forth between the intestines and the brain. If you have IBS, there may be a problem with these signals. As a result, the GI tract does not function normally. The intestines may become more sensitive and overreact to certain things. This may be especially true when you eat certain foods or when you are under stress. There are four types of IBS. These may be determined based on the consistency of your stool (feces):  IBS with diarrhea.  IBS with constipation.  Mixed IBS.  Unsubtyped IBS. It is important to know which type of IBS you have. Certain treatments are more likely to be helpful for certain types of IBS. What are the causes? The exact cause of IBS is not known. What increases the risk? You may have a higher risk for IBS if you:  Are female.  Are younger than 43.  Have a family history of IBS.  Have a mental health condition, such as depression, anxiety, or post-traumatic stress disorder.  Have had a bacterial infection of your GI tract. What are the signs or symptoms? Symptoms of IBS vary from person to person. The main symptom is abdominal pain or discomfort. Other symptoms usually include one or more of the following:  Diarrhea, constipation, or both.  Abdominal swelling or bloating.  Feeling full after eating a small or regular-sized meal.  Frequent gas.  Mucus in the stool.  A feeling of having more stool left after a bowel movement. Symptoms tend to come and go. They may be triggered by stress, mental health conditions, or  certain foods. How is this diagnosed? This condition may be diagnosed based on a physical exam, your medical history, and your symptoms. You may have tests, such as:  Blood tests.  Stool test.  X-rays.  CT scan.  Colonoscopy. This is a procedure in which your GI tract is viewed with a long, thin, flexible tube. How is this treated? There is no cure for IBS, but treatment can help relieve symptoms. Treatment depends on the type of IBS you have, and may include:  Changes to your diet, such as: ? Avoiding foods that cause symptoms. ? Drinking more water. ? Following a low-FODMAP (fermentable oligosaccharides, disaccharides, monosaccharides, and polyols) diet for up to 6 weeks, or as told by your health care provider. FODMAPs are sugars that are hard for some people to digest. ? Eating more fiber. ? Eating medium-sized meals at the same times every day.  Medicines. These may include: ? Fiber supplements, if you have constipation. ? Medicine to control diarrhea (antidiarrheal medicines). ? Medicine to help control muscle tightening (spasms) in your GI tract (antispasmodic medicines). ? Medicines to help with mental health conditions, such as antidepressants or tranquilizers.  Talk therapy or counseling.  Working with a diet and nutrition specialist (dietitian) to help create a food plan that is right for you.  Managing your stress. Follow these instructions at home: Eating and drinking  Eat a healthy diet.  Eat medium-sized meals at about the same time every day. Do not eat large meals.  Gradually eat more fiber-rich foods. These include whole grains, fruits, and vegetables. This may be especially helpful if you have IBS with constipation.  Eat a diet low in FODMAPs.  Drink enough fluid to keep your urine pale yellow.  Keep a journal of foods that seem to trigger symptoms.  Avoid foods and drinks that: ? Contain added sugar. ? Make your symptoms worse. Dairy products,  caffeinated drinks, and carbonated drinks can make symptoms worse for some people. General instructions  Take over-the-counter and prescription medicines and supplements only as told by your health care provider.  Get enough exercise. Do at least 150 minutes of moderate-intensity exercise each week.  Manage your stress. Getting enough sleep and exercise can help you manage stress.  Keep all follow-up visits as told by your health care provider and therapist. This is important. Alcohol Use  Do not drink alcohol if: ? Your health care provider tells you not to drink. ? You are pregnant, may be pregnant, or are planning to become pregnant.  If you drink alcohol, limit how much you have: ? 0-1 drink a day for women. ? 0-2 drinks a day for men.  Be aware of how much alcohol is in your drink. In the [place] one drink equals one typical bottle of beer (12 oz), one-half glass of wine (5 oz), or one shot of hard liquor (1 oz). Contact a health care provider if you have:  Constant pain.  Weight loss.  Difficulty or pain when swallowing.  Diarrhea that gets worse. Get help right away if you have:  Severe abdominal pain.  Fever.  Diarrhea with symptoms of dehydration, such as dizziness or dry mouth.  Bright red blood in your stool.  Stool that is black and tarry.  Abdominal swelling.  Vomiting that does not stop.  Blood in your vomit. Summary  Irritable bowel syndrome (IBS) is not one specific disease. It is a group of symptoms that affects digestion.  Your intestines may become more sensitive and overreact to certain things. This may be especially true when you eat certain foods or when you are under stress.  There is no cure for IBS, but treatment can help relieve symptoms. This information is not intended to replace advice given to you by your health care provider. Make sure you discuss any questions you have with your health care provider. Document Revised: 03/18/2017  Document Reviewed: 03/18/2017 Elsevier Patient Education  [place].  Consider keeping a food diary- common causes of diarrhea are dairy, certain carbs...  FODMAP stands for fermentable oligo-, di-, mono-saccharides and polyols (1). These are the scientific terms used to classify groups of carbs that are notorious for triggering digestive symptoms like bloating, gas and stomach pain.   FODMAPs are found in a wide range of foods in varying amounts. Some foods contain just one type, while others contain several.  The main dietary sources of the four groups of FODMAPs include:  Oligosaccharides: Wheat, rye, legumes and various fruits and vegetables, such as garlic and onions.  Disaccharides: Milk, yogurt and soft cheese. Lactose is the main carb.  Monosaccharides: Various fruit including figs and mangoes, and sweeteners such as honey and agave nectar. Fructose is the main carb.  Polyols: Certain fruits and vegetables including blackberries and lychee, as well as some low-calorie sweeteners like those in sugar-free gum.   Keep a food diary. This will help you identify foods that cause symptoms. Write down: ? What you eat and when. ? What symptoms you  have. ? When symptoms occur in relation to your meals.  Avoid foods that cause symptoms. Talk with your dietitian about other ways to get the same nutrients that are in these foods.  Eat your meals slowly, in a relaxed setting.  Aim to eat 5-6 small meals per day. Do not skip meals.  Drink enough fluids to keep your urine clear or pale yellow.  Ask your health care provider if you should take an over-the-counter probiotic during flare-ups to help restore healthy gut bacteria.  If you have cramping or diarrhea, try making your meals low in fat and high in carbohydrates. Examples of carbohydrates are pasta, rice, whole grain breads and cereals, fruits, and vegetables.  If dairy products cause your symptoms to flare up, try  eating less of them. You might be able to handle yogurt better than other dairy products because it contains bacteria that help with digestion.     Ways to prevent diarrhea with magnesium:  1) Don't take all your magnesium at the same time, have 2-3 smaller doses through out the day 2) Try taking your magnesium with high fiber meals.  3) If this does not help, take the magnesium on an empty stomach. Fiber for some people can bind the magnesium too well and prevent absorption in your gut.  4) Lastly try different types of magnesium. Most people are taking magnesium citrate, you can also try dimalate capsules which are slow release. You can also find magnesium lotions/sprays for the skin that bypass the gut. Another one that has good absorption is ReMag (pico-iconic magnesium formula), this has great cellular absorption so less of a laxative effect. You can find these type at health food stores or online.

## 2019-06-03 ENCOUNTER — Ambulatory Visit: Payer: BC Managed Care – PPO | Admitting: Physician Assistant

## 2019-06-17 ENCOUNTER — Ambulatory Visit: Payer: BC Managed Care – PPO | Admitting: Physician Assistant

## 2019-06-17 ENCOUNTER — Encounter: Payer: Self-pay | Admitting: Physician Assistant

## 2019-06-17 VITALS — BP 118/76 | HR 78 | Temp 97.2°F | Ht 64.0 in | Wt 213.0 lb

## 2019-06-17 DIAGNOSIS — F12188 Cannabis abuse with other cannabis-induced disorder: Secondary | ICD-10-CM

## 2019-06-17 DIAGNOSIS — K589 Irritable bowel syndrome without diarrhea: Secondary | ICD-10-CM | POA: Diagnosis not present

## 2019-06-17 DIAGNOSIS — K219 Gastro-esophageal reflux disease without esophagitis: Secondary | ICD-10-CM

## 2019-06-17 MED ORDER — PANTOPRAZOLE SODIUM 40 MG PO TBEC: DELAYED_RELEASE_TABLET | ORAL | 11 refills | Status: DC

## 2019-06-17 MED ORDER — HYOSCYAMINE SULFATE 0.125 MG SL SUBL: 0.1250 mg | SUBLINGUAL_TABLET | Freq: Four times a day (QID) | SUBLINGUAL | 4 refills | Status: DC | PRN

## 2019-06-17 MED ORDER — ONDANSETRON HCL 4 MG PO TABS: 4.0000 mg | ORAL_TABLET | Freq: Every day | ORAL | 1 refills | Status: DC | PRN

## 2019-06-17 NOTE — Patient Instructions (Signed)
We have sent the following medications to your pharmacy for you to pick up at your convenience: Protonix, Levsin, Zofran   If you are age 35 or younger, your body mass index should be between 19-25. Your Body mass index is 36.56 kg/m. If this is out of the aformentioned range listed, please consider follow up with your Primary Care Provider.   Follow-up as needed.   Thank you for choosing me and [facility].  Amy Esterwood-PA

## 2019-06-17 NOTE — Progress Notes (Signed)
Reviewed and agree with management plan.  Yajayra Feldt T. Reathel Turi, MD FACG [facility]  

## 2019-06-17 NOTE — Progress Notes (Signed)
Subjective:    Patient ID: Haarika Combee, female    DOB: [birth year], 34 y.o.   MRN: [id number]  HPI Sharlena is a pleasant 34 year old white female, now established with Dr. Fuller Plan and recently seen by Carl Best NP.  She comes back in today for follow-up.  She had been seen in December 2020 with nausea and vomiting, felt secondary to EtOH abuse, she had also been having reflux symptoms, chronic IBS type symptoms generally primarily with loose stools and had also been using marijuana on a daily basis. She discontinued EtOH use and has not had any in the past 3 months.  She had also been told that some of her nausea and vomiting episodes may be related to cannabis hyperemesis syndrome. She had another episode in mid February with nausea and vomiting episodes frequent in the morning and sometimes occurring later in the day as well.  She has now gotten herself off of cannabis and has not had any in the past 8 days.  She says she has not had any nausea and vomiting over the past 6 days.  She is now convinced that cannabis hyperemesis was playing a role in her symptoms.  She is not planning to go back to cannabis use and has joined a support group online. She is now on treatment for anxiety and depression as well, and is feeling much better from that standpoint.  She thinks this may have been driving some of her EtOH use. She continues to have loose bowel movements which she says has been normal for her for many years, sometimes 3-4 times daily.  There is no current complaints of abdominal pain, and no rectal bleeding.  CT of the abdomen and pelvis had been done in November 2020 which was unremarkable other than a rotated IUD. Labs in January 2021 unremarkable other than slightly elevated total eosinophil count   Review of Systems Pertinent positive and negative review of systems were noted in the above HPI section.  All other review of systems was otherwise negative.  Outpatient Encounter  Medications as of 06/17/2019  Medication Sig  . albuterol (PROVENTIL HFA;VENTOLIN HFA) 108 (90 Base) MCG/ACT inhaler Inhale 2 puffs into the lungs every 4 hours as needed for wheezing or shortness of breath. (Patient taking differently: Inhale 2 puffs into the lungs every 4 (four) hours as needed for wheezing or shortness of breath. )  . azelastine (ASTELIN) 0.1 % nasal spray SPRAY 1 SPRAY INTO BOTH NOSTRILS TWICE DAILY (Patient taking differently: Place 1 spray into both nostrils 2 (two) times daily as needed for rhinitis. )  . cetirizine (ZYRTEC) 10 MG tablet Take 10 mg by mouth daily.  . Cholecalciferol 4000 UNITS CAPS Take 4,000 Units by mouth daily.   . Flaxseed, Linseed, (FLAX SEED OIL) 1000 MG CAPS Take 1,000 mg by mouth daily.   . hyoscyamine (LEVSIN SL) 0.125 MG SL tablet Take 1 tablet (0.125 mg total) by mouth every 6 (six) hours as needed. Take 1 to 2 tablets 3 to 4 x day if needed for Nausea, vomiting, cramping or diarrhea  . Magnesium 500 MG CAPS Take 500 mg by mouth daily.   . methocarbamol (ROBAXIN-750) 750 MG tablet Take 1 tablet (750 mg total) by mouth every 8 (eight) hours as needed for muscle spasms.  . Multiple Vitamin (MULTIVITAMIN WITH MINERALS) TABS tablet Take 1 tablet by mouth daily.  . ondansetron (ZOFRAN) 4 MG tablet Take 1 tablet (4 mg total) by mouth daily as needed for nausea  or vomiting (Not sedating).  . pantoprazole (PROTONIX) 40 MG tablet Take 1 tablet Daily for Heart burn & Indigestion  . venlafaxine XR (EFFEXOR-XR) 150 MG 24 hr capsule Take 1 capsule (150 mg total) by mouth daily with breakfast.  . Vitamin D, Ergocalciferol, (DRISDOL) 1.25 MG (50000 UNIT) CAPS capsule 1 pill 2 days a week for vitamin d deficiency  . [DISCONTINUED] hyoscyamine (LEVSIN SL) 0.125 MG SL tablet Take 1 to 2 tablets 3 to 4 x day if needed for Nausea, vomiting, cramping or diarrhea  . [DISCONTINUED] ondansetron (ZOFRAN) 4 MG tablet Take 1 tablet (4 mg total) by mouth daily as needed for  nausea or vomiting (Not sedating).  . [DISCONTINUED] pantoprazole (PROTONIX) 40 MG tablet Take 1 tablet Daily for Heart burn & Indigestion  . [DISCONTINUED] linaclotide (LINZESS) 290 MCG CAPS capsule Take 1 capsule (290 mcg total) by mouth daily.  . [DISCONTINUED] naltrexone (DEPADE) 50 MG tablet Take 1 tablet (50 mg total) by mouth daily.  . [DISCONTINUED] potassium chloride (KLOR-CON) 10 MEQ tablet Take 1 tablet (10 mEq total) by mouth 2 (two) times daily.   No facility-administered encounter medications on file as of 06/17/2019.   Allergies  Allergen Reactions  . Neomycin Rash   Patient Active Problem List   Diagnosis Date Noted  . Intractable nausea and vomiting 02/28/2019  . Excessive drinking of alcohol 09/29/2018  . Stress and adjustment reaction 09/29/2018  . Nausea & vomiting 05/12/2018  . Diarrhea 05/12/2018  . GERD (gastroesophageal reflux disease) 01/09/2017  . Medication management 01/31/2015  . Depression 02/08/2014  . Generalized anxiety disorder 02/08/2014  . Vitamin D deficiency    Social History   Socioeconomic History  . Marital status: Legally Separated    Spouse name: Not on file  . Number of children: Not on file  . Years of education: Not on file  . Highest education level: Not on file  Occupational History  . Not on file  Tobacco Use  . Smoking status: Former Smoker    Packs/day: 1.00    Types: Cigarettes    Quit date: 07/09/2006    Years since quitting: 12.9  . Smokeless tobacco: Never Used  Substance and Sexual Activity  . Alcohol use: Not Currently    Alcohol/week: 30.0 standard drinks    Types: 30 Standard drinks or equivalent per week  . Drug use: Not Currently    Types: Marijuana  . Sexual activity: Not on file  Other Topics Concern  . Not on file  Social History Narrative  . Not on file   Social Determinants of Health   Financial Resource Strain:   . Difficulty of Paying Living Expenses:   Food Insecurity:   . Worried About  Charity fundraiser in the Last Year:   . Arboriculturist in the Last Year:   Transportation Needs:   . Film/video editor (Medical):   Marland Kitchen Lack of Transportation (Non-Medical):   Physical Activity:   . Days of Exercise per Week:   . Minutes of Exercise per Session:   Stress:   . Feeling of Stress :   Social Connections:   . Frequency of Communication with Friends and Family:   . Frequency of Social Gatherings with Friends and Family:   . Attends Religious Services:   . Active Member of Clubs or Organizations:   . Attends Archivist Meetings:   Marland Kitchen Marital Status:   Intimate Partner Violence:   . Fear of Current or Ex-Partner:   .  Emotionally Abused:   Marland Kitchen Physically Abused:   . Sexually Abused:     Ms. Scullin's family history includes Asthma in her sister; Cancer in her paternal grandfather and paternal grandmother; Diabetes in her maternal grandmother; Diverticulitis in her father; Heart disease in her maternal grandfather; Hyperlipidemia in her mother; Hypertension in her maternal grandmother.      Objective:    Vitals:   06/17/19 1401  BP: 118/76  Pulse: 78  Temp: (!) 97.2 F (36.2 C)    Physical Exam Well-developed well-nourished WF in no acute distress.  Height, Weight,213 BMI 36  HEENT; nontraumatic normocephalic, EOMI, PER R LA, sclera anicteric. Oropharynx; not examined Neck; supple, no JVD Cardiovascular; regular rate and rhythm with S1-S2, no murmur rub or gallop Pulmonary; Clear bilaterally Abdomen; soft, nontender, nondistended, no palpable mass or hepatosplenomegaly, bowel sounds are active Rectal; not done today Skin; benign exam, no jaundice rash or appreciable lesions Extremities; no clubbing cyanosis or edema skin warm and dry Neuro/Psych; alert and oriented x4, grossly nonfocal mood and affect appropriate      Assessment & Plan:   #57 34 year old white female with recurrent episodes of nausea and vomiting, likely secondary to cannabis  hyperemesis. Patient has discontinued all cannabis use and is feeling much better #2 history of EtOH abuse-abstinent over the past 3 months #3 GERD-well-controlled on Protonix #4 IBS manifested with chronic loose stools-stable  Plan I do not think she needs any further GI work-up at this time.  She was commended for stopping cannabis and EtOH and encouraged to continue complete abstinence from both. Continue Protonix 40 mg p.o. every morning We will give her a prescription for Levsin sublingual every 6 hours to have on hand as needed for abdominal cramping or exacerbations of her IBS symptoms. She will follow-up with Dr. Fuller Plan or myself on an as-needed basis  Amy Genia Harold PA-C 06/17/2019   Cc: Unk Pinto, MD

## 2019-06-25 ENCOUNTER — Other Ambulatory Visit: Payer: Self-pay | Admitting: Physician Assistant

## 2019-06-29 ENCOUNTER — Ambulatory Visit: Payer: Self-pay | Admitting: Gastroenterology

## 2019-07-06 ENCOUNTER — Other Ambulatory Visit: Payer: Self-pay | Admitting: Physician Assistant

## 2019-08-02 NOTE — Progress Notes (Signed)
Assessment and Plan:  Medication management -     CBC with Differential/Platelet -     COMPLETE METABOLIC PANEL WITH GFR -     Magnesium  Vitamin D deficiency -     VITAMIN D 25 Hydroxy (Vit-D Deficiency, Fractures)  Recurrent major depressive disorder, in partial remission ([place]) - continue medications, stress management techniques discussed, increase water, good sleep hygiene discussed, increase exercise, and increase veggies.   Generalized anxiety disorder -     TSH  Gastroesophageal reflux disease, unspecified whether esophagitis present Continue PPI/H2 blocker, diet discussed  Excessive drinking of alcohol Has decreased, continue to monitor labs  Mixed hyperlipidemia -     Lipid panel check lipids decrease fatty foods increase activity.      Future Appointments  Date Time Provider [place]  01/27/2020 10:00 AM [name], PA-C [facility] None     HPI 34 y.o.female with history of depression, anxiety, GERD, alcohol use presents for follow up.   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   Nausea and vomiting due to excessive alcohol use and marijuana use, , followed with GI and had EGD showing gastritis/esophagitis, neg H pylori. She is still on the protonix. She has stopped smoking and has not had any nausea/vomiting symptoms.  She is drinking 3-4 days a week, 3 glasses of wine.  CT AB showed fatty liver, sacroilitis.  [place] AB negative for gallbladder  BMI is Body mass index is 38.79 kg/m., she is working on diet and exercise. She admits to eating out more.  Wt Readings from Last 3 Encounters:  08/03/19 226 lb (102.5 kg)  06/17/19 213 lb (96.6 kg)  06/02/19 218 lb (98.9 kg)   Her blood pressure has been controlled at home, today their BP is BP: 124/82  She does not workout. She denies chest pain, shortness of breath,  dizziness.  She is not on cholesterol medication and denies myalgias. Mom with high cholesterol, MGF and MGM with heart disease.   Her cholesterol is not at goal. The cholesterol last visit was:   Lab Results  Component Value Date   CHOL 230 (H) 05/05/2019   HDL 53 05/05/2019   LDLCALC 144 (H) 05/05/2019   TRIG 191 (H) 05/05/2019   CHOLHDL 4.3 05/05/2019   Last A1C in the office was:  Lab Results  Component Value Date   HGBA1C 4.9 01/27/2019   Patient is on Vitamin D supplement, she finished her RX and is now on 5000 IU daily.  Lab Results  Component Value Date   VD25OH 21 (L) 05/05/2019        Past Medical History:  Diagnosis Date  . Abnormal Pap smear   . Acid reflux   . Allergy   . Anemia   . Anxiety   . Chlamydia 2006  . Depression   . Fracture   . Hypercholesterolemia   . IBS (irritable bowel syndrome)   . Vitamin D deficiency      Allergies  Allergen Reactions  . Neomycin Rash      Current Outpatient Medications (Respiratory):  .  albuterol (PROVENTIL HFA;VENTOLIN HFA) 108 (90 Base) MCG/ACT inhaler, Inhale 2 puffs into the lungs every 4 hours as needed for wheezing or shortness of breath. (Patient taking differently: Inhale 2 puffs into the lungs every 4 (four) hours as needed  for wheezing or shortness of breath. ) .  azelastine (ASTELIN) 0.1 % nasal spray, SPRAY 1 SPRAY INTO BOTH NOSTRILS TWICE DAILY (Patient taking differently: Place 1 spray into both nostrils 2 (two) times daily as needed for rhinitis. ) .  cetirizine (ZYRTEC) 10 MG tablet, Take 10 mg by mouth daily.    Current Outpatient Medications (Other):  Marland Kitchen  Cholecalciferol 4000 UNITS CAPS, Take 4,000 Units by mouth daily.  .  Flaxseed, Linseed, (FLAX SEED OIL) 1000 MG CAPS, Take 1,000 mg by mouth daily.  .  hyoscyamine (LEVSIN SL) 0.125 MG SL tablet, TAKE 1-2 TABLETS 3-4 X DAY IF NEEDED FOR NAUSEA/VOMITING/CRAMPING/DIARRHEA .  Magnesium 500 MG CAPS, Take 500 mg by mouth daily.  .  methocarbamol  (ROBAXIN-750) 750 MG tablet, Take 1 tablet (750 mg total) by mouth every 8 (eight) hours as needed for muscle spasms. .  Multiple Vitamin (MULTIVITAMIN WITH MINERALS) TABS tablet, Take 1 tablet by mouth daily. .  ondansetron (ZOFRAN) 4 MG tablet, Take 1 tablet (4 mg total) by mouth daily as needed for nausea or vomiting (Not sedating). .  pantoprazole (PROTONIX) 40 MG tablet, Take 1 tablet Daily for Heart burn & Indigestion .  venlafaxine XR (EFFEXOR-XR) 150 MG 24 hr capsule, Take 1 capsule (150 mg total) by mouth daily with breakfast. .  Vitamin D, Ergocalciferol, (DRISDOL) 1.25 MG (50000 UNIT) CAPS capsule, 1 pill 2 days a week for vitamin d deficiency  ROS: all negative except above.   Physical Exam: Filed Weights   08/03/19 1044  Weight: 226 lb (102.5 kg)   BP 124/82   Pulse 93   Temp (!) 97.5 F (36.4 C)   Wt 226 lb (102.5 kg)   SpO2 98%   BMI 38.79 kg/m  General Appearance: Well nourished, in no apparent distress. Eyes: PERRLA, EOMs, conjunctiva no swelling or erythema Sinuses: No Frontal/maxillary tenderness ENT/Mouth: Ext aud canals clear, TMs without erythema, bulging. Mouth and nose not examined- patient wearing a facemask Hearing normal.  Neck: Supple, thyroid normal.  Respiratory: Respiratory effort normal, BS equal bilaterally without rales, rhonchi, wheezing or stridor.  Cardio: RRR with no MRGs. Brisk peripheral pulses without edema.  Abdomen: Soft, + BS.  Non tender, no guarding, rebound, hernias, masses. Lymphatics: Non tender without lymphadenopathy.  Musculoskeletal: Full ROM, 5/5 strength, normal gait. normal range of motion and limited left lateral rotation due to pain, without spinous process tenderness, with paraspinal muscle tenderness the left side, normal sensation, reflexes, and pulses distal. Skin: Warm, dry without rashes, lesions, ecchymosis.  Neuro: Cranial nerves intact. Normal muscle tone, no cerebellar symptoms. Sensation intact.  Psych: Awake and  oriented X 3, normal affect, Insight and Judgment appropriate.     [name], PA-C 11:00 AM [facility]

## 2019-08-03 ENCOUNTER — Other Ambulatory Visit: Payer: Self-pay

## 2019-08-03 ENCOUNTER — Encounter: Payer: Self-pay | Admitting: Physician Assistant

## 2019-08-03 ENCOUNTER — Ambulatory Visit: Payer: BC Managed Care – PPO | Admitting: Physician Assistant

## 2019-08-03 VITALS — BP 124/82 | HR 93 | Temp 97.5°F | Wt 226.0 lb

## 2019-08-03 DIAGNOSIS — K219 Gastro-esophageal reflux disease without esophagitis: Secondary | ICD-10-CM | POA: Diagnosis not present

## 2019-08-03 DIAGNOSIS — F3341 Major depressive disorder, recurrent, in partial remission: Secondary | ICD-10-CM

## 2019-08-03 DIAGNOSIS — R197 Diarrhea, unspecified: Secondary | ICD-10-CM

## 2019-08-03 DIAGNOSIS — Z79899 Other long term (current) drug therapy: Secondary | ICD-10-CM | POA: Diagnosis not present

## 2019-08-03 DIAGNOSIS — F411 Generalized anxiety disorder: Secondary | ICD-10-CM | POA: Diagnosis not present

## 2019-08-03 DIAGNOSIS — E782 Mixed hyperlipidemia: Secondary | ICD-10-CM | POA: Diagnosis not present

## 2019-08-03 DIAGNOSIS — E559 Vitamin D deficiency, unspecified: Secondary | ICD-10-CM | POA: Diagnosis not present

## 2019-08-03 NOTE — Patient Instructions (Addendum)
Can do a steroid nasal spary 1-2 sparys at night each nostril.  Examples are nasonex, flonase, nasocort- they are over the counter.  Remember to spray each nostril twice towards the outer part of your eye.   Do not sniff but instead pinch your nose and tilt your head back to help the medicine get into your sinuses.   The best time to do this is at bedtime.  Stop if you get blurred vision or nose bleeds.   THIS WILL TAKE 7 DAYS TO WORK AND IS BETTER IF YOU START BEFORE SYMPTOMS SO IF YOU HAVE A SEASON OR TIME OF THE YEAR YOU ALWAYS GET A COLD, START BEFORE THAT!   Check out  Mini habits for weight loss book  2 free apps for tracking food is myfitness pal  loseit  If you want more structured weight loss that you have to pay for, you can look into  Noom  weight watchers  General eating tips  What to Avoid . Avoid added sugars o Often added sugar can be found in processed foods such as many condiments, dry cereals, cakes, cookies, chips, crisps, crackers, candies, sweetened drinks, etc.  o Read labels and AVOID/DECREASE use of foods with the following in their ingredient list: Sugar, fructose, high fructose corn syrup, sucrose, glucose, maltose, dextrose, molasses, cane sugar, brown sugar, any type of syrup, agave nectar, etc.   . Avoid snacking in between meals- drink water or if you feel you need a snack, pick a high water content snack such as cucumbers, watermelon, or any veggie.  Marland Kitchen Avoid foods made with flour o If you are going to eat food made with flour, choose those made with whole-grains; and, minimize your consumption as much as is tolerable . Avoid processed foods o These foods are generally stocked in the middle of the grocery store.  o Focus on shopping on the perimeter of the grocery.  What to Include . Vegetables o GREEN LEAFY VEGETABLES: Kale, spinach, mustard greens, collard greens, cabbage, broccoli, etc. o OTHER: Asparagus, cauliflower, eggplant, carrots, peas,  Brussel sprouts, tomatoes, bell peppers, zucchini, beets, cucumbers, etc. . Grains, seeds, and legumes o Beans: kidney beans, black eyed peas, garbanzo beans, black beans, pinto beans, etc. o Whole, unrefined grains: brown rice, barley, bulgur, oatmeal, etc. . Healthy fats  o Avoid highly processed fats such as vegetable oil o Examples of healthy fats: avocado, olives, virgin olive oil, dark chocolate (?72% Cocoa), nuts (peanuts, almonds, walnuts, cashews, pecans, etc.) o Please still do small amount of these healthy fats, they are dense in calories.  . Low - Moderate Intake of Animal Sources of Protein o Meat sources: chicken, [place], salmon, tuna. Limit to 4 ounces of meat at one time or the size of your palm. o Consider limiting dairy sources, but when choosing dairy focus on: PLAIN [place] yogurt, cottage cheese, high-protein milk . Fruit o Choose berries   Your LDL could improve, ideally we want it under a 100.  Your LDL is the bad cholesterol that can lead to heart attack and stroke. To lower your number you can decrease your fatty foods, red meat, cheese, milk and increase fiber like whole grains and veggies. You can also add a fiber supplement like Citracel or Benefiber, these do not cause gas and bloating and are safe to use. Especially if you have a strong family history of heart disease or stroke or you have evidence of plaque on any imaging like a chest xray, we may  discuss at your next office visit putting you on a medication to get your number below 100.

## 2019-08-04 LAB — COMPLETE METABOLIC PANEL WITH GFR
AG Ratio: 1.8 (calc) (ref 1.0–2.5)
ALT: 12 U/L (ref 6–29)
AST: 16 U/L (ref 10–30)
Albumin: 4.3 g/dL (ref 3.6–5.1)
Alkaline phosphatase (APISO): 67 U/L (ref 31–125)
BUN: 9 mg/dL (ref 7–25)
CO2: 29 mmol/L (ref 20–32)
Calcium: 9.2 mg/dL (ref 8.6–10.2)
Chloride: 103 mmol/L (ref 98–110)
Creat: 0.71 mg/dL (ref 0.50–1.10)
GFR, Est African American: 129 mL/min/{1.73_m2} (ref >=60)
GFR, Est Non African American: 111 mL/min/{1.73_m2} (ref >=60)
Globulin: 2.4 g/dL (calc) (ref 1.9–3.7)
Glucose, Bld: 72 mg/dL (ref 65–99)
Potassium: 4.3 mmol/L (ref 3.5–5.3)
Sodium: 139 mmol/L (ref 135–146)
Total Bilirubin: 0.3 mg/dL (ref 0.2–1.2)
Total Protein: 6.7 g/dL (ref 6.1–8.1)

## 2019-08-04 LAB — CBC WITH DIFFERENTIAL/PLATELET
Absolute Monocytes: 646 cells/uL (ref 200–950)
Basophils Absolute: 50 cells/uL (ref 0–200)
Basophils Relative: 0.7 %
Eosinophils Absolute: 170 cells/uL (ref 15–500)
Eosinophils Relative: 2.4 %
HCT: 40.1 % (ref 35.0–45.0)
Hemoglobin: 13 g/dL (ref 11.7–15.5)
Lymphs Abs: 2393 cells/uL (ref 850–3900)
MCH: 28.2 pg (ref 27.0–33.0)
MCHC: 32.4 g/dL (ref 32.0–36.0)
MCV: 87 fL (ref 80.0–100.0)
MPV: 9.8 fL (ref 7.5–12.5)
Monocytes Relative: 9.1 %
Neutro Abs: 3841 cells/uL (ref 1500–7800)
Neutrophils Relative %: 54.1 %
Platelets: 278 10*3/uL (ref 140–400)
RBC: 4.61 10*6/uL (ref 3.80–5.10)
RDW: 13.8 % (ref 11.0–15.0)
Total Lymphocyte: 33.7 %
WBC: 7.1 10*3/uL (ref 3.8–10.8)

## 2019-08-04 LAB — LIPID PANEL
Cholesterol: 238 mg/dL — ABNORMAL HIGH (ref <200)
HDL: 71 mg/dL (ref >=50)
LDL Cholesterol (Calc): 127 mg/dL (calc) — ABNORMAL HIGH
Non-HDL Cholesterol (Calc): 167 mg/dL (calc) — ABNORMAL HIGH (ref <130)
Total CHOL/HDL Ratio: 3.4 (calc) (ref <5.0)
Triglycerides: 260 mg/dL — ABNORMAL HIGH (ref <150)

## 2019-08-04 LAB — MAGNESIUM: Magnesium: 1.8 mg/dL (ref 1.5–2.5)

## 2019-08-04 LAB — VITAMIN D 25 HYDROXY (VIT D DEFICIENCY, FRACTURES): Vit D, 25-Hydroxy: 33 ng/mL (ref 30–100)

## 2019-08-04 LAB — TSH: TSH: 1.88 mIU/L

## 2019-08-12 DIAGNOSIS — Z713 Dietary counseling and surveillance: Secondary | ICD-10-CM | POA: Diagnosis not present

## 2019-09-13 ENCOUNTER — Other Ambulatory Visit: Payer: Self-pay | Admitting: Physician Assistant

## 2019-10-05 DIAGNOSIS — Z713 Dietary counseling and surveillance: Secondary | ICD-10-CM | POA: Diagnosis not present

## 2019-10-05 DIAGNOSIS — Z01419 Encounter for gynecological examination (general) (routine) without abnormal findings: Secondary | ICD-10-CM | POA: Diagnosis not present

## 2019-10-05 DIAGNOSIS — Z6841 Body Mass Index (BMI) 40.0 and over, adult: Secondary | ICD-10-CM | POA: Diagnosis not present

## 2019-11-25 DIAGNOSIS — Z713 Dietary counseling and surveillance: Secondary | ICD-10-CM | POA: Diagnosis not present

## 2019-12-01 ENCOUNTER — Other Ambulatory Visit: Payer: Self-pay

## 2019-12-01 ENCOUNTER — Other Ambulatory Visit: Payer: BC Managed Care – PPO

## 2019-12-01 DIAGNOSIS — Z20822 Contact with and (suspected) exposure to covid-19: Secondary | ICD-10-CM | POA: Diagnosis not present

## 2019-12-03 LAB — NOVEL CORONAVIRUS, NAA: SARS-CoV-2, NAA: NOT DETECTED

## 2019-12-03 LAB — SARS-COV-2, NAA 2 DAY TAT

## 2020-01-26 ENCOUNTER — Encounter: Payer: Self-pay | Admitting: Adult Health

## 2020-01-26 DIAGNOSIS — E669 Obesity, unspecified: Secondary | ICD-10-CM | POA: Insufficient documentation

## 2020-01-26 NOTE — Progress Notes (Signed)
Complete Physical  Assessment and Plan:  Encounter for general adult medical examination with abnormal findings 1 year  Recurrent major depressive disorder, in partial remission ([place])  Will taper off effexor and try wellbutrin for weight, motivation, energy boost Continue to reduce ETOH; lifestyle discussed; sleeps fairly; exercise encouraged No SI/HI  Generalized anxiety disorder Continue to reduce alcohol; much improved  Excessive drinking of alcohol Continue to reduce; goal to get off completely with slow tapering  Gastroesophageal reflux disease, unspecified whether esophagitis present Monitor, normal [place], continue PPI and limit ETOH  IBS-D Improved, continue to monitor, add soluble fiber  hyperlipidemia Recommend weight loss, low cholesterol diet, increase fiber and exercise.  LDL goal <100;  Check lipid panel.  -     Lipid panel -     TSH  Vitamin D deficiency -     VITAMIN D 25 Hydroxy (Vit-D Deficiency, Fractures)  Medication management -     CBC with Differential/Platelet -     COMPLETE METABOLIC PANEL WITH GFR -     TSH -     Magnesium  Screening for diabetes mellitus -     Hemoglobin A1c -     Insulin, random  Screening for hematuria or proteinuria -     Urinalysis, Routine w reflex microscopic  Morbid obesity ([place]) Long discussion about weight loss, diet, and exercise Recommended diet heavy in fruits and veggies and low in animal meats, cheeses, and dairy products, appropriate calorie intake Patient will work on moving more, small sustainable changes Will switch to wellbutrin Discussed appropriate weight for height and initial goal (240lb) Follow up at next visit -     Lipid panel -     TSH -     Hemoglobin A1c -     Insulin, random -     EKG 12-Lead  Elevated BP without diagnosis of hypertension Monitor blood pressure at home; call if consistently over 130/80 Continue DASH diet.   Reminder to go to the ER if any CP, SOB, nausea, dizziness,  severe HA, changes vision/speech, left arm numbness and tingling and jaw pain. -     CBC with Differential/Platelet -     COMPLETE METABOLIC PANEL WITH GFR -     Magnesium -     TSH -     Insulin, random -     Urinalysis, Routine w reflex microscopic -     EKG 12-Lead   Discussed med's effects and SE's. Screening labs and tests as requested with regular follow-up as recommended. Over 40 minutes of exam, counseling, chart review, and complex, high level critical decision making was performed this visit.   Future Appointments  Date Time Provider [place]  02/14/2021 10:00 AM Liane Comber, NP [facility] None     HPI  34 y.o. female  presents for a complete physical and follow up for has Vitamin D deficiency; Depression; Generalized anxiety disorder; Medication management; GERD (gastroesophageal reflux disease); Irritable bowel syndrome (IBS); Excessive drinking of alcohol; Stress and adjustment reaction; and Obesity (BMI 35.0-39.9 without comorbidity) on their problem list..  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 She has a kindergarten son and 1st grader. Newly divorced and happy. Too busy to date. Declines STD check today. Follows with GYN at [facility] annually.   Was having nausea/vomiting, gastritis, GERD; she was drinking excessively, smoking marijuana after separation; US showed normal gallbladder, did show fatty liver. She was referred to GI, had CT that showed rotated IUD,  has seen GYN, but ultimately sx improved after quitting marijuana. GERD well controlled on protonix. Also IBS with loose stools, but GI felt due to improvement no further workup needed at this time. Has reduced from 5 drinks a night to 2-3 nights. Was prescribed naltrexone last year, continue to slowly reduce.   She states has decreased motivation, feels more depressed. She is on effexor on 150mg  that was helping last year, but getting worse in  recent months. Denies SI/HI.   BMI is Body mass index is 45.49 kg/m., she is aware of weight gain, plans to start moving more, subscribed to exercise program, needs to do. Admits has been doing more sugary drinks, some cravings.  Wt Readings from Last 3 Encounters:  01/27/20 265 lb (120.2 kg)  08/03/19 226 lb (102.5 kg)  06/17/19 213 lb (96.6 kg)   Today their BP is BP: 130/90 She does not workout. She denies chest pain, shortness of breath, dizziness.   She is not on cholesterol medication and denies myalgias. Her cholesterol is not at goal. The cholesterol last visit was:   Lab Results  Component Value Date   CHOL 238 (H) 08/03/2019   HDL 71 08/03/2019   LDLCALC 127 (H) 08/03/2019   TRIG 260 (H) 08/03/2019   CHOLHDL 3.4 08/03/2019   Last A1C in the office was:  Lab Results  Component Value Date   HGBA1C 4.9 01/27/2019   Patient is on Vitamin D supplement, 5000 a day.   Lab Results  Component Value Date   VD25OH 33 08/03/2019        Current Medications:  Current Outpatient Medications on File Prior to Visit  Medication Sig Dispense Refill   albuterol (PROVENTIL HFA;VENTOLIN HFA) 108 (90 Base) MCG/ACT inhaler Inhale 2 puffs into the lungs every 4 hours as needed for wheezing or shortness of breath. (Patient taking differently: Inhale 2 puffs into the lungs every 4 (four) hours as needed for wheezing or shortness of breath. ) 3 Inhaler 0   cetirizine (ZYRTEC) 10 MG tablet Take 10 mg by mouth daily.     Cholecalciferol 125 MCG (5000 UT) capsule Take 5,000 Units by mouth daily.      Flaxseed, Linseed, (FLAX SEED OIL) 1000 MG CAPS Take 1,000 mg by mouth daily.      hyoscyamine (LEVSIN SL) 0.125 MG SL tablet TAKE 1-2 TABLETS 3-4 X DAY IF NEEDED FOR NAUSEA/VOMITING/CRAMPING/DIARRHEA 100 tablet 4   Magnesium 500 MG CAPS Take 500 mg by mouth daily.      methocarbamol (ROBAXIN-750) 750 MG tablet Take 1 tablet (750 mg total) by mouth every 8 (eight) hours as needed for muscle  spasms. 60 tablet 1   Multiple Vitamin (MULTIVITAMIN WITH MINERALS) TABS tablet Take 1 tablet by mouth daily.     ondansetron (ZOFRAN) 4 MG tablet Take 1 tablet (4 mg total) by mouth daily as needed for nausea or vomiting (Not sedating). 30 tablet 1   pantoprazole (PROTONIX) 40 MG tablet Take 1 tablet Daily for Heart burn & Indigestion 30 tablet 11   Vitamin D, Ergocalciferol, (DRISDOL) 1.25 MG (50000 UNIT) CAPS capsule 1 pill 2 days a week for vitamin d deficiency 36 capsule 1   azelastine (ASTELIN) 0.1 % nasal spray SPRAY 1 SPRAY INTO BOTH NOSTRILS TWICE DAILY (Patient not taking: Reported on 01/27/2020) 90 mL 3   No current facility-administered medications on file prior to visit.   Allergies:  Allergies  Allergen Reactions   Neomycin Rash   Medical History:  She  has Vitamin D deficiency; Depression; Generalized anxiety disorder; Medication management; GERD (gastroesophageal reflux disease); Irritable bowel syndrome (IBS); Excessive drinking of alcohol; Stress and adjustment reaction; and Obesity (BMI 35.0-39.9 without comorbidity) on their problem list.   Health Maintenance:   Immunization History  Administered Date(s) Administered   Influenza Inj Mdck Quad With Preservative 01/09/2017   Influenza,inj,Quad PF,6+ Mos 11/28/2018   PPD Test 12/09/2013   Tdap 11/19/2010, 02/15/2013, 09/15/2014   Tetanus: 2012 Pneumovax: Prevnar 13:  Flu vaccine: 2020, TODAY  Covid 19: has had, will send record   Pap: Sept 25 2019 HAS IUD- need to replace next year - GYN following MGM: N/A AB [place] 10/2018 normal gallbladder, fatty liver DEXA: Colonoscopy: N/A EGD: N/A CXR 2015  Last eye: annually, glasses Last dental: 2021, goes q107m  Patient Care Team: Unk Pinto, MD as PCP - General (Internal Medicine) Dyke Maes, [place] (Optometry) Murrell Redden Earlyne Iba, MD as Consulting Physician (Obstetrics and Gynecology)  Surgical History:  She has a past surgical history that includes  Wisdom tooth extraction and Colposcopy (2006). Family History:  Herfamily history includes Asthma in her sister; Cancer in her paternal grandfather and paternal grandmother; Diabetes in her maternal grandmother; Diverticulitis in her father; Heart disease in her maternal grandfather; Hyperlipidemia in her mother; Hypertension in her maternal grandmother; Osteopenia in her mother. Social History:  She reports that she quit smoking about 13 years ago. Her smoking use included cigarettes. She smoked 1.00 pack per day. She has never used smokeless tobacco. She reports previous alcohol use of about 20.0 standard drinks of alcohol per week. She reports previous drug use. Drug: Marijuana.  Review of Systems: Review of Systems  Constitutional: Negative.  Negative for malaise/fatigue and weight loss.  HENT: Negative.  Negative for hearing loss and tinnitus.   Eyes: Negative.  Negative for blurred vision and double vision.  Respiratory: Negative.  Negative for cough, shortness of breath and wheezing.   Cardiovascular: Negative.  Negative for chest pain, palpitations, orthopnea, claudication and leg swelling.  Gastrointestinal: Positive for heartburn (improved, rare). Negative for abdominal pain, blood in stool, constipation, diarrhea, melena, nausea and vomiting.  Genitourinary: Negative.   Musculoskeletal: Negative.  Negative for joint pain and myalgias.  Skin: Negative.  Negative for rash.  Neurological: Negative for dizziness, tingling, sensory change, weakness and headaches.  Endo/Heme/Allergies: Negative for polydipsia.  Psychiatric/Behavioral: Positive for depression and substance abuse (alcohol, 2-3 per night, has reduced). Negative for hallucinations, memory loss and suicidal ideas. The patient is not nervous/anxious and does not have insomnia.   All other systems reviewed and are negative.   Physical Exam: Estimated body mass index is 45.49 kg/m as calculated from the following:   Height as  of this encounter: 5\' 4"  (1.626 m).   Weight as of this encounter: 265 lb (120.2 kg). BP 130/90    Pulse 92    Temp (!) 97.2 F (36.2 C)    Ht 5\' 4"  (1.626 m)    Wt 265 lb (120.2 kg)    SpO2 99%    BMI 45.49 kg/m  General Appearance: Well nourished, well dressed, morbidly obese female in no apparent distress.  Eyes: PERRLA, EOMs, conjunctiva no swelling or erythema Sinuses: No Frontal/maxillary tenderness  ENT/Mouth: Ext aud canals clear, normal light reflex with TMs without erythema, bulging. Good dentition. No erythema, swelling, or exudate on post pharynx. Tonsils not swollen or erythematous. Hearing normal.  Neck: Supple, thyroid normal. No bruits  Respiratory: Respiratory effort normal, BS equal bilaterally without rales,  rhonchi, wheezing or stridor.  Cardio: RRR without murmurs, rubs or gallops. Brisk peripheral pulses without edema.  Chest: symmetric, with normal excursions and percussion.  Breasts: Defer to GYN Abdomen: Soft, obese abdomen, nontender, no guarding, rebound, hernias, masses, or organomegaly.  Lymphatics: Non tender without lymphadenopathy.  Genitourinary: Defer to GYN Musculoskeletal: Full ROM all peripheral extremities,5/5 strength, and normal gait.  Skin: Warm, dry without rashes, lesions, ecchymosis. Neuro: Cranial nerves intact, reflexes equal bilaterally. Normal muscle tone, no cerebellar symptoms. Sensation intact.  Psych: Awake and oriented X 3, normal affect, Insight and Judgment appropriate.   EKG: NSR, No ST changes  Gorden Harms Imari Reen 10:22 AM [facility]

## 2020-01-27 ENCOUNTER — Other Ambulatory Visit: Payer: Self-pay

## 2020-01-27 ENCOUNTER — Ambulatory Visit (INDEPENDENT_AMBULATORY_CARE_PROVIDER_SITE_OTHER): Payer: 59 | Admitting: Adult Health

## 2020-01-27 ENCOUNTER — Encounter: Payer: Self-pay | Admitting: Adult Health

## 2020-01-27 VITALS — BP 130/90 | HR 92 | Temp 97.2°F | Ht 64.0 in | Wt 265.0 lb

## 2020-01-27 DIAGNOSIS — F1010 Alcohol abuse, uncomplicated: Secondary | ICD-10-CM

## 2020-01-27 DIAGNOSIS — Z23 Encounter for immunization: Secondary | ICD-10-CM

## 2020-01-27 DIAGNOSIS — E559 Vitamin D deficiency, unspecified: Secondary | ICD-10-CM

## 2020-01-27 DIAGNOSIS — Z1389 Encounter for screening for other disorder: Secondary | ICD-10-CM

## 2020-01-27 DIAGNOSIS — R030 Elevated blood-pressure reading, without diagnosis of hypertension: Secondary | ICD-10-CM

## 2020-01-27 DIAGNOSIS — K580 Irritable bowel syndrome with diarrhea: Secondary | ICD-10-CM

## 2020-01-27 DIAGNOSIS — Z0000 Encounter for general adult medical examination without abnormal findings: Secondary | ICD-10-CM

## 2020-01-27 DIAGNOSIS — Z131 Encounter for screening for diabetes mellitus: Secondary | ICD-10-CM

## 2020-01-27 DIAGNOSIS — K219 Gastro-esophageal reflux disease without esophagitis: Secondary | ICD-10-CM

## 2020-01-27 DIAGNOSIS — Z136 Encounter for screening for cardiovascular disorders: Secondary | ICD-10-CM | POA: Diagnosis not present

## 2020-01-27 DIAGNOSIS — F411 Generalized anxiety disorder: Secondary | ICD-10-CM

## 2020-01-27 DIAGNOSIS — Z1329 Encounter for screening for other suspected endocrine disorder: Secondary | ICD-10-CM

## 2020-01-27 DIAGNOSIS — F3341 Major depressive disorder, recurrent, in partial remission: Secondary | ICD-10-CM

## 2020-01-27 DIAGNOSIS — E669 Obesity, unspecified: Secondary | ICD-10-CM

## 2020-01-27 DIAGNOSIS — F4329 Adjustment disorder with other symptoms: Secondary | ICD-10-CM

## 2020-01-27 DIAGNOSIS — Z79899 Other long term (current) drug therapy: Secondary | ICD-10-CM

## 2020-01-27 MED ORDER — BUPROPION HCL ER (XL) 150 MG PO TB24
150.0000 mg | ORAL_TABLET | ORAL | 2 refills | Status: DC
Start: 2020-01-27 — End: 2020-02-23

## 2020-01-27 MED ORDER — VENLAFAXINE HCL ER 37.5 MG PO CP24
ORAL_CAPSULE | ORAL | 0 refills | Status: DC
Start: 2020-01-27 — End: 2020-02-23

## 2020-01-27 NOTE — Patient Instructions (Addendum)
[name] , Thank you for taking time to come for your Annual Wellness Visit. I appreciate your ongoing commitment to your health goals. Please review the following plan we discussed and let me know if I can assist you in the future.   These are the goals we discussed: Goals    . DIET - EAT MORE FRUITS AND VEGETABLES    . DIET - INCREASE WATER INTAKE    . Exercise 3x per week (30 min per time)       This is a list of the screening recommended for you and due dates:  Health Maintenance  Topic Date Due  .  Hepatitis C: One time screening is recommended by Center for Disease Control  (CDC) for  adults born from [birth year] through 1966.   Never done  . COVID-19 Vaccine (1) Never done  . Flu Shot  11/07/2019  . Pap Smear  12/31/2020  . Tetanus Vaccine  09/14/2024  . HIV Screening  Completed     Know what a healthy weight is for you (roughly BMI <25) and aim to maintain this  Aim for 5-7+ servings (1/2 cup each) of fruits and vegetables daily  65-80+ fluid ounces of water or unsweet tea for healthy kidneys  Limit to max 1 drink of alcohol per day; avoid smoking/tobacco  Limit animal fats in diet for cholesterol and heart health - choose grass fed whenever available  Avoid highly processed foods, and foods high in saturated/trans fats  Aim for low stress - take time to unwind and care for your mental health  Aim for 150 min of moderate intensity exercise weekly for heart health, and weights twice weekly for bone health  Aim for 7-9 hours of sleep daily    A great goal to work towards is aiming to get in a serving daily of some of the most nutritionally dense foods - G- BOMBS daily         Bupropion sustained-release tablets (Depression/Mood Disorders) What is this medicine? BUPROPION (byoo PROE pee on) is used to treat depression. This medicine may be used for other purposes; ask your health care provider or pharmacist if you have questions. COMMON BRAND NAME(S): Budeprion  SR, Wellbutrin SR What should I tell my health care provider before I take this medicine? They need to know if you have any of these conditions:  an eating disorder, such as anorexia or bulimia  bipolar disorder or psychosis  diabetes or high blood sugar, treated with medication  glaucoma  head injury or brain tumor  heart disease, previous heart attack, or irregular heart beat  high blood pressure  kidney or liver disease  seizures  suicidal thoughts or a previous suicide attempt  Tourette's syndrome  weight loss  an unusual or allergic reaction to bupropion, other medicines, foods, dyes, or preservatives  breast-feeding  pregnant or trying to become pregnant How should I use this medicine? Take this medicine by mouth with a glass of water. Follow the directions on the prescription label. You can take it with or without food. If it upsets your stomach, take it with food. Do not cut, crush or chew this medicine. Take your medicine at regular intervals. If you take this medicine more than once a day, take your second dose at least 8 hours after you take your first dose. To limit difficulty in sleeping, avoid taking this medicine at bedtime. Do not take your medicine more often than directed. Do not stop taking this medicine suddenly  except upon the advice of your doctor. Stopping this medicine too quickly may cause serious side effects or your condition may worsen. A special MedGuide will be given to you by the pharmacist with each prescription and refill. Be sure to read this information carefully each time. Talk to your pediatrician regarding the use of this medicine in children. Special care may be needed. Overdosage: If you think you have taken too much of this medicine contact a poison control center or emergency room at once. NOTE: This medicine is only for you. Do not share this medicine with others. What if I miss a dose? If you miss a dose, skip the missed dose and  take your next tablet at the regular time. There should be at least 8 hours between doses. Do not take double or extra doses. What may interact with this medicine? Do not take this medicine with any of the following medications:  linezolid  MAOIs like Azilect, Carbex, Eldepryl, Marplan, Nardil, and Parnate  methylene blue (injected into a vein)  other medicines that contain bupropion like Zyban This medicine may also interact with the following medications:  alcohol  certain medicines for anxiety or sleep  certain medicines for blood pressure like metoprolol, propranolol  certain medicines for depression or psychotic disturbances  certain medicines for HIV or AIDS like efavirenz, lopinavir, nelfinavir, ritonavir  certain medicines for irregular heart beat like propafenone, flecainide  certain medicines for Parkinson's disease like amantadine, levodopa  certain medicines for seizures like carbamazepine, phenytoin, phenobarbital  cimetidine  clopidogrel  cyclophosphamide  digoxin  furazolidone  isoniazid  nicotine  orphenadrine  procarbazine  steroid medicines like prednisone or cortisone  stimulant medicines for attention disorders, weight loss, or to stay awake  tamoxifen  theophylline  thiotepa  ticlopidine  tramadol  warfarin This list may not describe all possible interactions. Give your health care provider a list of all the medicines, herbs, non-prescription drugs, or dietary supplements you use. Also tell them if you smoke, drink alcohol, or use illegal drugs. Some items may interact with your medicine. What should I watch for while using this medicine? Tell your doctor if your symptoms do not get better or if they get worse. Visit your doctor or healthcare provider for regular checks on your progress. Because it may take several weeks to see the full effects of this medicine, it is important to continue your treatment as prescribed by your  doctor. This medicine may cause serious skin reactions. They can happen weeks to months after starting the medicine. Contact your healthcare provider right away if you notice fevers or flu-like symptoms with a rash. The rash may be red or purple and then turn into blisters or peeling of the skin. Or, you might notice a red rash with swelling of the face, lips or lymph nodes in your neck or under your arms. Patients and their families should watch out for new or worsening thoughts of suicide or depression. Also watch out for sudden changes in feelings such as feeling anxious, agitated, panicky, irritable, hostile, aggressive, impulsive, severely restless, overly excited and hyperactive, or not being able to sleep. If this happens, especially at the beginning of treatment or after a change in dose, call your healthcare provider. Avoid alcoholic drinks while taking this medicine. Drinking excessive alcoholic beverages, using sleeping or anxiety medicines, or quickly stopping the use of these agents while taking this medicine may increase your risk for a seizure. Do not drive or use heavy machinery until you  know how this medicine affects you. This medicine can impair your ability to perform these tasks. Do not take this medicine close to bedtime. It may prevent you from sleeping. Your mouth may get dry. Chewing sugarless gum or sucking hard candy, and drinking plenty of water may help. Contact your doctor if the problem does not go away or is severe. What side effects may I notice from receiving this medicine? Side effects that you should report to your doctor or health care professional as soon as possible:  allergic reactions like skin rash, itching or hives, swelling of the face, lips, or tongue  breathing problems  changes in vision  confusion  elevated mood, decreased need for sleep, racing thoughts, impulsive behavior  fast or irregular heartbeat  hallucinations, loss of contact with  reality  increased blood pressure  rash, fever, and swollen lymph nodes  redness, blistering, peeling or loosening of the skin, including inside the mouth  seizures  suicidal thoughts or other mood changes  unusually weak or tired  vomiting Side effects that usually do not require medical attention (report to your doctor or health care professional if they continue or are bothersome):  constipation  headache  loss of appetite  nausea  tremors  weight loss This list may not describe all possible side effects. Call your doctor for medical advice about side effects. You may report side effects to FDA at 1-800-FDA-1088. Where should I keep my medicine? Keep out of the reach of children. Store at room temperature between 20 and 25 degrees C (68 and 77 degrees F), away from direct sunlight and moisture. Keep tightly closed. Throw away any unused medicine after the expiration date. NOTE: This sheet is a summary. It may not cover all possible information. If you have questions about this medicine, talk to your doctor, pharmacist, or health care provider.  2020 Elsevier/Gold Standard (2018-06-18 13:55:14)

## 2020-01-28 ENCOUNTER — Other Ambulatory Visit: Payer: Self-pay | Admitting: Adult Health

## 2020-01-28 LAB — URINALYSIS, ROUTINE W REFLEX MICROSCOPIC
Bilirubin Urine: NEGATIVE
Glucose, UA: NEGATIVE
Hgb urine dipstick: NEGATIVE
Ketones, ur: NEGATIVE
Leukocytes,Ua: NEGATIVE
Nitrite: NEGATIVE
Protein, ur: NEGATIVE
Specific Gravity, Urine: 1.022 (ref 1.001–1.03)
pH: 6.5 (ref 5.0–8.0)

## 2020-01-28 LAB — COMPLETE METABOLIC PANEL WITH GFR
AG Ratio: 1.7 (calc) (ref 1.0–2.5)
ALT: 11 U/L (ref 6–29)
AST: 15 U/L (ref 10–30)
Albumin: 4.4 g/dL (ref 3.6–5.1)
Alkaline phosphatase (APISO): 74 U/L (ref 31–125)
BUN: 10 mg/dL (ref 7–25)
CO2: 27 mmol/L (ref 20–32)
Calcium: 9.2 mg/dL (ref 8.6–10.2)
Chloride: 102 mmol/L (ref 98–110)
Creat: 0.66 mg/dL (ref 0.50–1.10)
GFR, Est African American: 133 mL/min/{1.73_m2} (ref >=60)
GFR, Est Non African American: 114 mL/min/{1.73_m2} (ref >=60)
Globulin: 2.6 g/dL (calc) (ref 1.9–3.7)
Glucose, Bld: 85 mg/dL (ref 65–99)
Potassium: 3.7 mmol/L (ref 3.5–5.3)
Sodium: 137 mmol/L (ref 135–146)
Total Bilirubin: 0.3 mg/dL (ref 0.2–1.2)
Total Protein: 7 g/dL (ref 6.1–8.1)

## 2020-01-28 LAB — CBC WITH DIFFERENTIAL/PLATELET
Absolute Monocytes: 651 cells/uL (ref 200–950)
Basophils Absolute: 28 cells/uL (ref 0–200)
Basophils Relative: 0.3 %
Eosinophils Absolute: 242 cells/uL (ref 15–500)
Eosinophils Relative: 2.6 %
HCT: 39.6 % (ref 35.0–45.0)
Hemoglobin: 13 g/dL (ref 11.7–15.5)
Lymphs Abs: 2418 cells/uL (ref 850–3900)
MCH: 28.5 pg (ref 27.0–33.0)
MCHC: 32.8 g/dL (ref 32.0–36.0)
MCV: 86.8 fL (ref 80.0–100.0)
MPV: 10 fL (ref 7.5–12.5)
Monocytes Relative: 7 %
Neutro Abs: 5961 cells/uL (ref 1500–7800)
Neutrophils Relative %: 64.1 %
Platelets: 271 10*3/uL (ref 140–400)
RBC: 4.56 10*6/uL (ref 3.80–5.10)
RDW: 13.5 % (ref 11.0–15.0)
Total Lymphocyte: 26 %
WBC: 9.3 10*3/uL (ref 3.8–10.8)

## 2020-01-28 LAB — LIPID PANEL
Cholesterol: 244 mg/dL — ABNORMAL HIGH (ref <200)
HDL: 61 mg/dL (ref >=50)
LDL Cholesterol (Calc): 137 mg/dL (calc) — ABNORMAL HIGH
Non-HDL Cholesterol (Calc): 183 mg/dL (calc) — ABNORMAL HIGH (ref <130)
Total CHOL/HDL Ratio: 4 (calc) (ref <5.0)
Triglycerides: 319 mg/dL — ABNORMAL HIGH (ref <150)

## 2020-01-28 LAB — INSULIN, RANDOM: Insulin: 15.9 u[IU]/mL

## 2020-01-28 LAB — MAGNESIUM: Magnesium: 2 mg/dL (ref 1.5–2.5)

## 2020-01-28 LAB — TSH: TSH: 4.04 mIU/L

## 2020-01-28 LAB — HEMOGLOBIN A1C
Hgb A1c MFr Bld: 5 % of total Hgb (ref <5.7)
Mean Plasma Glucose: 97 (calc)
eAG (mmol/L): 5.4 (calc)

## 2020-01-28 LAB — VITAMIN D 25 HYDROXY (VIT D DEFICIENCY, FRACTURES): Vit D, 25-Hydroxy: 20 ng/mL — ABNORMAL LOW (ref 30–100)

## 2020-01-28 MED ORDER — CHOLECALCIFEROL 125 MCG (5000 UT) PO CAPS: 10000.0000 [IU] | ORAL_CAPSULE | Freq: Every day | ORAL | Status: DC

## 2020-02-22 ENCOUNTER — Emergency Department (HOSPITAL_COMMUNITY)
Admission: EM | Admit: 2020-02-22 | Discharge: 2020-02-22 | Disposition: A | Discharge status: Home / Self Care | Payer: 59 | Attending: Emergency Medicine | Admitting: Emergency Medicine

## 2020-02-22 ENCOUNTER — Ambulatory Visit: Payer: 59 | Admitting: Adult Health Nurse Practitioner

## 2020-02-22 DIAGNOSIS — T7840XA Allergy, unspecified, initial encounter: Secondary | ICD-10-CM | POA: Diagnosis not present

## 2020-02-22 DIAGNOSIS — R21 Rash and other nonspecific skin eruption: Secondary | ICD-10-CM

## 2020-02-22 DIAGNOSIS — R0602 Shortness of breath: Secondary | ICD-10-CM | POA: Diagnosis not present

## 2020-02-22 DIAGNOSIS — R0789 Other chest pain: Secondary | ICD-10-CM | POA: Diagnosis not present

## 2020-02-22 DIAGNOSIS — Z87891 Personal history of nicotine dependence: Secondary | ICD-10-CM | POA: Diagnosis not present

## 2020-02-22 DIAGNOSIS — H5711 Ocular pain, right eye: Secondary | ICD-10-CM | POA: Diagnosis not present

## 2020-02-22 LAB — CBC WITH DIFFERENTIAL/PLATELET
Abs Immature Granulocytes: 0.04 10*3/uL (ref 0.00–0.07)
Basophils Absolute: 0 10*3/uL (ref 0.0–0.1)
Basophils Relative: 0 %
Eosinophils Absolute: 0.3 10*3/uL (ref 0.0–0.5)
Eosinophils Relative: 4 %
HCT: 42 % (ref 36.0–46.0)
Hemoglobin: 13.5 g/dL (ref 12.0–15.0)
Immature Granulocytes: 1 %
Lymphocytes Relative: 5 %
Lymphs Abs: 0.4 10*3/uL — ABNORMAL LOW (ref 0.7–4.0)
MCH: 28.4 pg (ref 26.0–34.0)
MCHC: 32.1 g/dL (ref 30.0–36.0)
MCV: 88.2 fL (ref 80.0–100.0)
Monocytes Absolute: 0.5 10*3/uL (ref 0.1–1.0)
Monocytes Relative: 5 %
Neutro Abs: 7.5 10*3/uL (ref 1.7–7.7)
Neutrophils Relative %: 85 %
Platelets: 218 10*3/uL (ref 150–400)
RBC: 4.76 MIL/uL (ref 3.87–5.11)
RDW: 14.1 % (ref 11.5–15.5)
WBC: 8.7 10*3/uL (ref 4.0–10.5)
nRBC: 0 % (ref 0.0–0.2)

## 2020-02-22 LAB — BASIC METABOLIC PANEL
Anion gap: 11 (ref 5–15)
BUN: 10 mg/dL (ref 6–20)
CO2: 20 mmol/L — ABNORMAL LOW (ref 22–32)
Calcium: 8.9 mg/dL (ref 8.9–10.3)
Chloride: 105 mmol/L (ref 98–111)
Creatinine, Ser: 0.79 mg/dL (ref 0.44–1.00)
GFR, Estimated: >60 mL/min (ref >60)
Glucose, Bld: 115 mg/dL — ABNORMAL HIGH (ref 70–99)
Potassium: 4.2 mmol/L (ref 3.5–5.1)
Sodium: 136 mmol/L (ref 135–145)

## 2020-02-22 LAB — I-STAT BETA HCG BLOOD, ED (MC, WL, AP ONLY): I-stat hCG, quantitative: <5 m[IU]/mL (ref <5)

## 2020-02-22 MED ORDER — EPINEPHRINE 0.3 MG/0.3ML IJ SOAJ: 0.3000 mg | Freq: Once | INTRAMUSCULAR | Status: DC

## 2020-02-22 MED ORDER — FAMOTIDINE IN NACL 20-0.9 MG/50ML-% IV SOLN
20.0000 mg | Freq: Once | INTRAVENOUS | Status: AC
  Administered 2020-02-22: 20 mg via INTRAVENOUS
  Filled 2020-02-22: qty 50

## 2020-02-22 MED ORDER — METHYLPREDNISOLONE SODIUM SUCC 125 MG IJ SOLR
125.0000 mg | Freq: Once | INTRAMUSCULAR | Status: AC
  Administered 2020-02-22: 125 mg via INTRAVENOUS
  Filled 2020-02-22: qty 2

## 2020-02-22 MED ORDER — TETRACAINE HCL 0.5 % OP SOLN
2.0000 [drp] | Freq: Once | OPHTHALMIC | Status: AC
  Administered 2020-02-22: 2 [drp] via OPHTHALMIC
  Filled 2020-02-22: qty 4

## 2020-02-22 MED ORDER — SODIUM CHLORIDE 0.9 % IV BOLUS
1000.0000 mL | Freq: Once | INTRAVENOUS | Status: AC
  Administered 2020-02-22: 1000 mL via INTRAVENOUS

## 2020-02-22 MED ORDER — EPINEPHRINE 0.3 MG/0.3ML IJ SOAJ
0.3000 mg | Freq: Once | INTRAMUSCULAR | Status: AC
  Administered 2020-02-22: 0.3 mg via INTRAMUSCULAR
  Filled 2020-02-22: qty 0.3

## 2020-02-22 MED ORDER — PREDNISONE 50 MG PO TABS: 50.0000 mg | ORAL_TABLET | Freq: Every day | ORAL | 0 refills | Status: AC

## 2020-02-22 MED ORDER — FLUORESCEIN SODIUM 1 MG OP STRP
1.0000 | ORAL_STRIP | Freq: Once | OPHTHALMIC | Status: AC
  Administered 2020-02-22: 1 via OPHTHALMIC
  Filled 2020-02-22: qty 1

## 2020-02-22 MED ORDER — DIPHENHYDRAMINE HCL 50 MG/ML IJ SOLN
50.0000 mg | Freq: Once | INTRAMUSCULAR | Status: AC
  Administered 2020-02-22: 50 mg via INTRAVENOUS
  Filled 2020-02-22: qty 1

## 2020-02-22 NOTE — ED Provider Notes (Signed)
[facility] DEPT Provider Note   CSN: [id number] Arrival date & time: 02/22/20  0623     History Chief Complaint  Patient presents with  . Rash    Angela Dixon is a 34 y.o. female who presents to the ED today with complaint of gradual onset, constant, spreading, itching, rash to body that began yesterday morning. Pt reports the itching started around her left waistline and began to spread to her trunk, face, upper arms, and upper legs. She took Benadryl and Zyrtec yesterday without relief; last took  Benadryl around 3 AM this morning. Pt mentions she began feeling a chest tightness/shortness of breath yesterday however this has seemed to improve from yesterday. Denies any throat swelling/difficulty swallowing. Pt recently started Wellbutrin 11 days ago however states the rash only started yesterday. She also mentions starting a new fabric softener 2-3 weeks ago. Denies any other new medications, foods, or environments. Pt did recently sprain her right ankle and states that she also noticed a rash on her ankle recently. She was wearing a lace up ankle brace and after taking it off noticed the rash there. Pt denies fevers, chills, cough, abdominal pain, nausea, vomiting, or any other associated symptoms.   The history is provided by the patient and medical records.       Past Medical History:  Diagnosis Date  . Abnormal Pap smear   . Acid reflux   . Allergy   . Anemia   . Anxiety   . Cannabinoid hyperemesis syndrome 02/28/2019  . Chlamydia 2006  . Depression   . Fracture   . Hypercholesterolemia   . IBS (irritable bowel syndrome)   . Vitamin D deficiency     Patient Active Problem List   Diagnosis Date Noted  . Elevated BP without diagnosis of hypertension 01/27/2020  . Morbid obesity ([place]) 01/26/2020  . Excessive drinking of alcohol 09/29/2018  . Stress and adjustment reaction 09/29/2018  . Irritable bowel syndrome (IBS) 05/12/2018  . GERD  (gastroesophageal reflux disease) 01/09/2017  . Medication management 01/31/2015  . Depression 02/08/2014  . Generalized anxiety disorder 02/08/2014  . Vitamin D deficiency     Past Surgical History:  Procedure Laterality Date  . COLPOSCOPY  2006  . WISDOM TOOTH EXTRACTION       OB History    Gravida  2   Para  1   Term  1   Preterm      AB      Living  1     SAB      TAB      Ectopic      Multiple      Live Births  1           Family History  Problem Relation Age of Onset  . Hyperlipidemia Mother   . Osteopenia Mother   . Asthma Sister   . Hypertension Maternal Grandmother   . Diabetes Maternal Grandmother   . Heart disease Maternal Grandfather   . Cancer Paternal Grandmother        throat, smoker  . Cancer Paternal Grandfather        lung, smoker  . Diverticulitis Father   . Colon cancer Neg Hx   . Esophageal cancer Neg Hx   . Stomach cancer Neg Hx   . Rectal cancer Neg Hx     Social History   Tobacco Use  . Smoking status: Former Smoker    Packs/day: 1.00    Types: Cigarettes  Quit date: 07/09/2006    Years since quitting: 13.6  . Smokeless tobacco: Never Used  Vaping Use  . Vaping Use: Never used  Substance Use Topics  . Alcohol use: Not Currently    Alcohol/week: 20.0 standard drinks    Types: 20 Standard drinks or equivalent per week  . Drug use: Not Currently    Types: Marijuana    Comment: last 05/2019    Home Medications Prior to Admission medications   Medication Sig Start Date End Date Taking? Authorizing Provider  albuterol (PROVENTIL HFA;VENTOLIN HFA) 108 (90 Base) MCG/ACT inhaler Inhale 2 puffs into the lungs every 4 hours as needed for wheezing or shortness of breath. Patient taking differently: Inhale 2 puffs into the lungs every 4 (four) hours as needed for wheezing or shortness of breath.  01/16/17  Yes Unk Pinto, MD  buPROPion (WELLBUTRIN XL) 150 MG 24 hr tablet Take 1 tablet (150 mg total) by mouth every  morning. Start taking after off of effexor. 01/27/20 01/26/21 Yes Liane Comber, NP  cetirizine (ZYRTEC) 10 MG tablet Take 10 mg by mouth daily.   Yes [provider]  Cholecalciferol 125 MCG (5000 UT) capsule Take 2 capsules (10,000 Units total) by mouth daily. 01/28/20  Yes Liane Comber, NP  Cyanocobalamin (B-12 PO) Take 1 tablet by mouth daily.   Yes [provider]  Ferrous Sulfate (IRON PO) Take 1 tablet by mouth daily.   Yes [provider]  Flaxseed, Linseed, (FLAX SEED OIL) 1000 MG CAPS Take 1,000 mg by mouth daily.    Yes [provider]  hydrocortisone cream 1 % Apply 1 application topically 4 (four) times daily as needed for itching.   Yes [provider]  Multiple Vitamin (MULTIVITAMIN WITH MINERALS) TABS tablet Take 1 tablet by mouth daily.   Yes [provider]  pantoprazole (PROTONIX) 40 MG tablet Take 1 tablet Daily for Heart burn & Indigestion Patient taking differently: Take 40 mg by mouth daily.  06/17/19  Yes Esterwood, Amy S, PA-C  polyvinyl alcohol (LIQUIFILM TEARS) 1.4 % ophthalmic solution Place 1 drop into both eyes as needed for dry eyes.   Yes [provider]  predniSONE (DELTASONE) 50 MG tablet Take 1 tablet (50 mg total) by mouth daily for 5 days. 02/22/20 02/27/20  Eustaquio Maize, PA-C  venlafaxine XR (EFFEXOR XR) 37.5 MG 24 hr capsule Take 2 caps for 2-3 weeks, then 1 cap for 2 weeks then stop. Patient not taking: Reported on 02/22/2020 01/27/20   Liane Comber, NP    Allergies    Neomycin  Review of Systems   Review of Systems  Constitutional: Negative for chills and fever.  Respiratory: Positive for chest tightness and shortness of breath. Negative for wheezing.   Cardiovascular: Negative for chest pain.  Gastrointestinal: Negative for nausea and vomiting.  Skin: Positive for rash.    Physical Exam Updated Vital Signs BP 128/80   Pulse (!) 103   Temp 98.8 F (37.1 C) (Oral)   Resp  16   Ht 5\' 4"  (1.626 m)   Wt 120 kg   SpO2 98%   BMI 45.41 kg/m   Physical Exam Vitals and nursing note reviewed.  Constitutional:      Appearance: She is obese. She is not ill-appearing or diaphoretic.  HENT:     Head: Normocephalic and atraumatic.     Mouth/Throat:     Mouth: Mucous membranes are moist.     Comments: Small macules appreciated around [place] border diffusely  as well as oral lesions appreciated to soft palate  Eyes:     General:        Right eye: No discharge.        Left eye: No discharge.     Extraocular Movements: Extraocular movements intact.     Conjunctiva/sclera: Conjunctivae normal.     Pupils: Pupils are equal, round, and reactive to light.     Right eye: No corneal abrasion or fluorescein uptake.  Cardiovascular:     Rate and Rhythm: Normal rate and regular rhythm.  Pulmonary:     Effort: Pulmonary effort is normal.     Breath sounds: Normal breath sounds. No wheezing, rhonchi or rales.  Abdominal:     Tenderness: There is no abdominal tenderness. There is no guarding or rebound.  Skin:    General: Skin is warm and dry.     Coloration: Skin is not jaundiced.     Findings: Rash present.     Comments: Diffuse maculopapular rash most specifically to trunk extending into neck/face and proximal aspect of arms and legs; no palmar or sole involvement; no sloughing appearance  Contact dermatitis appearing rash to right ankle with satellite lesions   Neurological:     Mental Status: She is alert.     ED Results / Procedures / Treatments   Labs (all labs ordered are listed, but only abnormal results are displayed) Labs Reviewed  BASIC METABOLIC PANEL - Abnormal; Notable for the following components:      Result Value   CO2 20 (*)    Glucose, Bld 115 (*)    All other components within normal limits  CBC WITH DIFFERENTIAL/PLATELET - Abnormal; Notable for the following components:   Lymphs Abs 0.4 (*)    All other components within normal limits    I-STAT BETA HCG BLOOD, ED ([facility], [facility], [facility] ONLY)    EKG None  Radiology No results found.  Procedures Procedures (including critical care time)  Medications Ordered in ED Medications  diphenhydrAMINE (BENADRYL) injection 50 mg (50 mg Intravenous Given 02/22/20 0949)  famotidine (PEPCID) IVPB 20 mg premix (0 mg Intravenous Stopped 02/22/20 1017)  methylPREDNISolone sodium succinate (SOLU-MEDROL) 125 mg/2 mL injection 125 mg (125 mg Intravenous Given 02/22/20 0950)  EPINEPHrine (EPI-PEN) injection 0.3 mg (0.3 mg Intramuscular Given 02/22/20 0959)  fluorescein ophthalmic strip 1 strip (1 strip Right Eye Given 02/22/20 1232)  tetracaine (PONTOCAINE) 0.5 % ophthalmic solution 2 drop (2 drops Right Eye Given 02/22/20 1232)  sodium chloride 0.9 % bolus 1,000 mL (1,000 mLs Intravenous New Bag/Given 02/22/20 1343)    ED Course  I have reviewed the triage vital signs and the nursing notes.  Pertinent labs & imaging results that were available during my care of the patient were reviewed by me and considered in my medical decision making (see chart for details).    MDM Rules/Calculators/A&P                          34 year old female who presents to the ED today with complaint of rash and shortness of breath that started yesterday, started at her baseline and has not spread, very itchy in nature.  No relief with Benadryl and Zyrtec yesterday prompting her to come to the ED today.  Recently started Wellbutrin 11 days ago, new fabric softener 2 to 3 weeks ago, cannot think of any other precipitating factors to the rash.  On arrival to the ED patient is afebrile and on  tachypneic however tachycardic in the 110s to 120s.  She is able to speak in full sentences without difficulty and satting 99% on room air however does states she feels chest tightness and some shortness of breath, improved from yesterday.  Denies any throat swelling, difficulty swallowing.  On exam patient is not wheezing, lungs are  clear to auscultation bilaterally.  She has a maculopapular rash diffusely most specifically to her trunk extending up into her face as well as the proximal aspects of her bilateral and lower extremities.  She does appear to have some oral lesions along the soft palate as well, her rash does not appear classic for SJS or TEN.  She is also noted to have a contact dermatitis type rash to her right ankle that does not appear connected to her other rash does not appear similar to her other rash, she was wearing an ankle brace from previous ankle sprain to her right ankle last week.  In the extent of her rash we will plan for epi as well as Solu-Medrol, Pepcid, Benadryl.  Patient will be watched for 4 hours for improvement.  Will obtain labs as well.   CBC without leukocytosis. Hgb stable at 13.5. BMP with glucose 115, bicarb 20. No gap. No other findings.  Beta hcg negative  Pts SOB has improved after symptoms however she continues to have diffuse rash. Will plan to treat with prednisone and benadryl outpatient. Pt's heart rate has improved however still tachy; fluids provided. She does report a FB sensation to her right eye yesterday however none currently; has some itching to bilateral eyes. No fluoroscein uptake on exam today; suspect allergic conjunctivitis associated with her reaction  Pt discharged home at this time with close PCP follow up. She was appropriately monitored in the ED for over 4 hours after epinephrine and continues to be stable. Pt instructed on strict return precautions. She is instructed to discontinue wellbutrin today. Pt is in agreement with plan and stable for discharge home.   This note was prepared using Dragon voice recognition software and may include unintentional dictation errors due to the inherent limitations of voice recognition software.  Final Clinical Impression(s) / ED Diagnoses Final diagnoses:  Allergic reaction, initial encounter  Rash    Rx / DC Orders ED  Discharge Orders         Ordered    predniSONE (DELTASONE) 50 MG tablet  Daily        02/22/20 1403           Discharge Instructions     Please discontinue the wellbutrin as this is likely the cause of your reaction Pick up prednisone and take as prescribed. You can also take Benadryl OTC for your itching.  Follow up with your PCP regarding your ED visit today Return to the ED for any worsening symptoms       Eustaquio Maize, PA-C 02/22/20 1405    Blanchie Dessert, MD 02/24/20 1016

## 2020-02-22 NOTE — Discharge Instructions (Signed)
Please discontinue the wellbutrin as this is likely the cause of your reaction Pick up prednisone and take as prescribed. You can also take Benadryl OTC for your itching.  Follow up with your PCP regarding your ED visit today Return to the ED for any worsening symptoms

## 2020-02-22 NOTE — ED Triage Notes (Signed)
Generalized rash, red, itchy started yesterday. Did start new meds, Wellbutrin, 11 days ago and new fabric softner about 2 weeks ago. SOB started at lunch time yesterday.

## 2020-02-23 ENCOUNTER — Other Ambulatory Visit: Payer: Self-pay | Admitting: Adult Health

## 2020-02-23 ENCOUNTER — Encounter: Payer: Self-pay | Admitting: Adult Health

## 2020-02-23 ENCOUNTER — Ambulatory Visit (INDEPENDENT_AMBULATORY_CARE_PROVIDER_SITE_OTHER): Payer: 59 | Admitting: Adult Health

## 2020-02-23 ENCOUNTER — Other Ambulatory Visit: Payer: Self-pay

## 2020-02-23 VITALS — BP 130/76 | HR 105 | Temp 97.7°F | Wt 269.0 lb

## 2020-02-23 DIAGNOSIS — F411 Generalized anxiety disorder: Secondary | ICD-10-CM

## 2020-02-23 DIAGNOSIS — F4329 Adjustment disorder with other symptoms: Secondary | ICD-10-CM

## 2020-02-23 DIAGNOSIS — T50905D Adverse effect of unspecified drugs, medicaments and biological substances, subsequent encounter: Secondary | ICD-10-CM

## 2020-02-23 DIAGNOSIS — F331 Major depressive disorder, recurrent, moderate: Secondary | ICD-10-CM

## 2020-02-23 MED ORDER — VENLAFAXINE HCL ER 37.5 MG PO CP24: ORAL_CAPSULE | ORAL | 0 refills | Status: DC

## 2020-02-23 NOTE — Progress Notes (Signed)
Assessment and Plan:  Angela Dixon was seen today for medication reaction.  Diagnoses and all orders for this visit:  Stress and adjustment reaction Generalized anxiety disorder Moderate episode of recurrent major depressive disorder (HCC) Suspected allergic reaction to recently initiated wellbutrin Discussed restart effexor vs trial of SSRI - notably hx of zoloft SE She prefers to restart effexor at this time - 37.5 mg caps, restart 1 cap daily x 2 weeks, then 2 caps daily  Discussed counseling - she has free resources via work, will reach out to initiate Denies SI/HI, working on tapering down on alcohol  Stress management techniques discussed, increase water, good sleep hygiene discussed, increase exercise, and increase veggies. Stress management app suggested   Follow up 6-8 weeks, sooner if needed -     venlafaxine XR (EFFEXOR XR) 37.5 MG 24 hr capsule; Take 1 cap daily for 2 weeks, then 2 cap daily.  Adverse effect of drug, subsequent encounter Off of wellbutrin, continue prednisone as prescribed, ok to take benadryl 25-50 mg PRN q6 hours - expect improvement in next 48 hours off of med  Further disposition pending results of labs. Discussed med's effects and SE's.   Over 30 minutes of exam, counseling, chart review, and critical decision making was performed.   Future Appointments  Date Time Provider [place]  03/23/2020 10:30 AM Liane Comber, NP [facility] None  08/29/2020 10:30 AM Liane Comber, NP [facility] None  02/14/2021 10:00 AM Liane Comber, NP [facility] None    ------------------------------------------------------------------------------------------------------------------   HPI 34 y.o.female with hx of recurrent major depression presents for ED follow up for hives/suspected med reaction to wellbutrin.  She was started on effexor for depression last year, was up to 150 mg which was initially working well, but admits stress with kids, work, trying to  pass a test and was having more down days, poor motivation/energy, had gained a lot of weight and wanted to try alternative agent. Dicussed effexor taper with 37.5 mg caps over 4 weeks, then initiated wellbutrin 150 mg which she started about 2 weeks ago. 11 days into the new med, she developed widespread rash/hives and presented to the ED for evaluation on 02/22/2020, had epi, started on prednisone 50 mg daily, also taking benadryl. Stopped wellbutrin, but requesting restart alternative mood med today.   Discussed SSRI; she reports took zoloft remotely, stopped due to excessive head sweating to the point of insomnia. She doesn't think she has tried celexa or lexapro. Discussed trying this vs restart effexor. She also has access to counseling via work, but hasn't pursued this. Would be willing to do so.   Denies SI/HI. Sleeps fairly with benadryl and melanonin when needed.   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 She has a kindergarten son and 1st grader. Newly divorced and happy. Too busy to date.  Last year was having nausea/vomiting, gastritis, GERD; she was drinking excessively, smoking marijuana after separation; US showed normal gallbladder, did show fatty liver. She was referred to GI, had CT that showed rotated IUD, has seen GYN, but ultimately sx improved after quitting marijuana. GERD well controlled on protonix. Also IBS with loose stools, but GI felt due to improvement no further workup needed at this time. Has reduced alcohol from 5 drinks a night to 2-3/night. Was prescribed naltrexone last year, continue to slowly reduce.    Past Medical History:  Diagnosis Date  . Abnormal Pap smear   . Acid reflux   . Allergy   .  Anemia   . Anxiety   . Cannabinoid hyperemesis syndrome 02/28/2019  . Chlamydia 2006  . Depression   . Fracture   . Hypercholesterolemia   . IBS (irritable bowel syndrome)   . Vitamin D deficiency       Allergies  Allergen Reactions  . Neomycin Rash    Current Outpatient Medications on File Prior to Visit  Medication Sig  . albuterol (PROVENTIL HFA;VENTOLIN HFA) 108 (90 Base) MCG/ACT inhaler Inhale 2 puffs into the lungs every 4 hours as needed for wheezing or shortness of breath. (Patient taking differently: Inhale 2 puffs into the lungs every 4 (four) hours as needed for wheezing or shortness of breath. )  . cetirizine (ZYRTEC) 10 MG tablet Take 10 mg by mouth daily.  . Cholecalciferol 125 MCG (5000 UT) capsule Take 2 capsules (10,000 Units total) by mouth daily.  . Cyanocobalamin (B-12 PO) Take 1 tablet by mouth daily.  . Ferrous Sulfate (IRON PO) Take 1 tablet by mouth daily.  . Flaxseed, Linseed, (FLAX SEED OIL) 1000 MG CAPS Take 1,000 mg by mouth daily.   . hydrocortisone cream 1 % Apply 1 application topically 4 (four) times daily as needed for itching.  . Multiple Vitamin (MULTIVITAMIN WITH MINERALS) TABS tablet Take 1 tablet by mouth daily.  . pantoprazole (PROTONIX) 40 MG tablet Take 1 tablet Daily for Heart burn & Indigestion (Patient taking differently: Take 40 mg by mouth daily. )  . polyvinyl alcohol (LIQUIFILM TEARS) 1.4 % ophthalmic solution Place 1 drop into both eyes as needed for dry eyes.  . predniSONE (DELTASONE) 50 MG tablet Take 1 tablet (50 mg total) by mouth daily for 5 days.  Marland Kitchen venlafaxine XR (EFFEXOR XR) 37.5 MG 24 hr capsule Take 2 caps for 2-3 weeks, then 1 cap for 2 weeks then stop. (Patient not taking: Reported on 02/22/2020)   No current facility-administered medications on file prior to visit.    ROS: all negative except above.   Physical Exam:  BP 130/76   Pulse (!) 105   Temp 97.7 F (36.5 C)   Wt 269 lb (122 kg)   SpO2 99%   BMI 46.17 kg/m   General Appearance: Well nourished, in no acute distress. Eyes: PERRLA, EOMs, conjunctiva no swelling or erythema Sinuses: No Frontal/maxillary tenderness ENT/Mouth: Ext aud canals clear, TMs without  erythema, bulging. No erythema, swelling, or exudate on post pharynx.  Tonsils not swollen or erythematous. Hearing normal.  Neck: Supple, thyroid normal.  Respiratory: Respiratory effort normal, BS equal bilaterally without rales, rhonchi, wheezing or stridor.  Cardio: RRR with no MRGs. Brisk peripheral pulses without edema.  Abdomen: Soft, + BS.  Non tender, no guarding, rebound, hernias, masses. Lymphatics: Non tender without lymphadenopathy.  Musculoskeletal: normal gait.  Skin: Warm, dry; she has Diffuse maculopapular rash most specifically to trunk extending into neck/face and proximal aspect of arms and legs; no palmar or sole involvement; no sloughing appearance Contact dermatitis appearing rash to right ankle with satellite lesions   Neuro: Cranial nerves intact. Normal muscle tone, no cerebellar symptoms. Sensation intact.  Psych: Awake and oriented X 3, depressed, intermittently tearful affect, Insight and Judgment appropriate.     [name], NP 3:33 PM [facility]

## 2020-02-23 NOTE — Patient Instructions (Signed)
Counseling services  I suggest calling your insurance and finding out who is in your network and THEN calling those people or looking them up on google.   I'm a big fan of Cognitive Behavioral Therapy, look this up on You tube or check with the therapist you see if they are certified.  This form of therapy helps to teach you skills to better handle with current situation that are causing anxiety or depression.   There are some great apps too Check out [employer], give thanks app.  Meditations apps are great like headspace.    Breathing exercises  Deep breath in through your nose, and slow exhale through your mouth over 7 seconds. Imagine all the muscles in your body relaxing as you exhale. Repeat a few times.      Venlafaxine extended-release capsules What is this medicine? VENLAFAXINE(VEN la fax een) is used to treat depression, anxiety and panic disorder. This medicine may be used for other purposes; ask your health care provider or pharmacist if you have questions. COMMON BRAND NAME(S): Effexor XR What should I tell my health care provider before I take this medicine? They need to know if you have any of these conditions:  bleeding disorders  glaucoma  heart disease  high blood pressure  high cholesterol  kidney disease  liver disease  low levels of sodium in the blood  mania or bipolar disorder  seizures  suicidal thoughts, plans, or attempt; a previous suicide attempt by you or a family  take medicines that treat or prevent blood clots  thyroid disease  an unusual or allergic reaction to venlafaxine, desvenlafaxine, other medicines, foods, dyes, or preservatives  pregnant or trying to get pregnant  breast-feeding How should I use this medicine? Take this medicine by mouth with a full glass of water. Follow the directions on the prescription label. Do not cut, crush, or chew this medicine. Take it with food. If needed, the capsule may be carefully  opened and the entire contents sprinkled on a spoonful of cool applesauce. Swallow the applesauce/pellet mixture right away without chewing and follow with a glass of water to ensure complete swallowing of the pellets. Try to take your medicine at about the same time each day. Do not take your medicine more often than directed. Do not stop taking this medicine suddenly except upon the advice of your doctor. Stopping this medicine too quickly may cause serious side effects or your condition may worsen. A special MedGuide will be given to you by the pharmacist with each prescription and refill. Be sure to read this information carefully each time. Talk to your pediatrician regarding the use of this medicine in children. Special care may be needed. Overdosage: If you think you have taken too much of this medicine contact a poison control center or emergency room at once. NOTE: This medicine is only for you. Do not share this medicine with others. What if I miss a dose? If you miss a dose, take it as soon as you can. If it is almost time for your next dose, take only that dose. Do not take double or extra doses. What may interact with this medicine? Do not take this medicine with any of the following medications:  certain medicines for fungal infections like fluconazole, itraconazole, ketoconazole, posaconazole, voriconazole  cisapride  desvenlafaxine  dronedarone  duloxetine  levomilnacipran  linezolid  MAOIs like Carbex, Eldepryl, Marplan, Nardil, and Parnate  methylene blue (injected into a vein)  milnacipran  pimozide  thioridazine This medicine may also interact with the following medications:  amphetamines  aspirin and aspirin-like medicines  certain medicines for depression, anxiety, or psychotic disturbances  certain medicines for migraine headaches like almotriptan, eletriptan, frovatriptan, naratriptan, rizatriptan, sumatriptan, zolmitriptan  certain medicines for  sleep  certain medicines that treat or prevent blood clots like dalteparin, enoxaparin, warfarin  cimetidine  clozapine  diuretics  fentanyl  furazolidone  indinavir  isoniazid  lithium  metoprolol  NSAIDS, medicines for pain and inflammation, like ibuprofen or naproxen  other medicines that prolong the QT interval (cause an abnormal heart rhythm) like dofetilide, ziprasidone  procarbazine  rasagiline  supplements like St. John's wort, kava kava, valerian  tramadol  tryptophan This list may not describe all possible interactions. Give your health care provider a list of all the medicines, herbs, non-prescription drugs, or dietary supplements you use. Also tell them if you smoke, drink alcohol, or use illegal drugs. Some items may interact with your medicine. What should I watch for while using this medicine? Tell your doctor if your symptoms do not get better or if they get worse. Visit your doctor or health care professional for regular checks on your progress. Because it may take several weeks to see the full effects of this medicine, it is important to continue your treatment as prescribed by your doctor. Patients and their families should watch out for new or worsening thoughts of suicide or depression. Also watch out for sudden changes in feelings such as feeling anxious, agitated, panicky, irritable, hostile, aggressive, impulsive, severely restless, overly excited and hyperactive, or not being able to sleep. If this happens, especially at the beginning of treatment or after a change in dose, call your health care professional. This medicine can cause an increase in blood pressure. Check with your doctor for instructions on monitoring your blood pressure while taking this medicine. You may get drowsy or dizzy. Do not drive, use machinery, or do anything that needs mental alertness until you know how this medicine affects you. Do not stand or sit up quickly, especially if  you are an older patient. This reduces the risk of dizzy or fainting spells. Alcohol may interfere with the effect of this medicine. Avoid alcoholic drinks. Your mouth may get dry. Chewing sugarless gum, sucking hard candy and drinking plenty of water will help. Contact your doctor if the problem does not go away or is severe. What side effects may I notice from receiving this medicine? Side effects that you should report to your doctor or health care professional as soon as possible:  allergic reactions like skin rash, itching or hives, swelling of the face, lips, or tongue  anxious  breathing problems  confusion  changes in vision  chest pain  confusion  elevated mood, decreased need for sleep, racing thoughts, impulsive behavior  eye pain  fast, irregular heartbeat  feeling faint or lightheaded, falls  feeling agitated, angry, or irritable  hallucination, loss of contact with reality  high blood pressure  loss of balance or coordination  palpitations  redness, blistering, peeling or loosening of the skin, including inside the mouth  restlessness, pacing, inability to keep still  seizures  stiff muscles  suicidal thoughts or other mood changes  trouble passing urine or change in the amount of urine  trouble sleeping  unusual bleeding or bruising  unusually weak or tired  vomiting Side effects that usually do not require medical attention (report to your doctor or health care professional if they  continue or are bothersome):  change in sex drive or performance  change in appetite or weight  constipation  dizziness  dry mouth  headache  increased sweating  nausea  tired This list may not describe all possible side effects. Call your doctor for medical advice about side effects. You may report side effects to FDA at 1-800-FDA-1088. Where should I keep my medicine? Keep out of the reach of children. Store at a controlled temperature between  20 and 25 degrees C (68 degrees and 77 degrees F), in a dry place. Throw away any unused medicine after the expiration date. NOTE: This sheet is a summary. It may not cover all possible information. If you have questions about this medicine, talk to your doctor, pharmacist, or health care provider.  2020 Elsevier/Gold Standard (2018-03-17 12:06:43)

## 2020-03-08 ENCOUNTER — Other Ambulatory Visit: Payer: Self-pay

## 2020-03-08 MED ORDER — PANTOPRAZOLE SODIUM 40 MG PO TBEC
DELAYED_RELEASE_TABLET | ORAL | 1 refills | Status: DC
Start: 2020-03-08 — End: 2020-09-21

## 2020-03-23 ENCOUNTER — Ambulatory Visit: Payer: 59 | Admitting: Adult Health

## 2020-03-24 ENCOUNTER — Other Ambulatory Visit: Payer: Self-pay

## 2020-03-24 MED ORDER — VENLAFAXINE HCL ER 37.5 MG PO CP24
ORAL_CAPSULE | ORAL | 0 refills | Status: DC
Start: 2020-03-24 — End: 2020-03-29

## 2020-03-28 ENCOUNTER — Other Ambulatory Visit: Payer: Self-pay | Admitting: Adult Health

## 2020-04-08 ENCOUNTER — Other Ambulatory Visit: Payer: Self-pay | Admitting: Internal Medicine

## 2020-04-08 MED ORDER — VENLAFAXINE HCL ER 75 MG PO CP24
ORAL_CAPSULE | ORAL | 0 refills | Status: DC
Start: 2020-04-08 — End: 2020-06-30

## 2020-04-19 ENCOUNTER — Ambulatory Visit (INDEPENDENT_AMBULATORY_CARE_PROVIDER_SITE_OTHER): Payer: 59 | Admitting: Adult Health

## 2020-04-19 ENCOUNTER — Encounter: Payer: Self-pay | Admitting: Adult Health

## 2020-04-19 ENCOUNTER — Other Ambulatory Visit: Payer: Self-pay

## 2020-04-19 VITALS — BP 122/76 | HR 93 | Temp 96.3°F | Wt 268.4 lb

## 2020-04-19 DIAGNOSIS — F411 Generalized anxiety disorder: Secondary | ICD-10-CM | POA: Diagnosis not present

## 2020-04-19 DIAGNOSIS — F331 Major depressive disorder, recurrent, moderate: Secondary | ICD-10-CM

## 2020-04-19 NOTE — Patient Instructions (Signed)
Try tracking GI sx  Try adding daily soluble fiber     Low-FODMAP Eating Plan  FODMAP stands for fermentable oligosaccharides, disaccharides, monosaccharides, and polyols. These are sugars that are hard for some people to digest. A low-FODMAP eating plan may help some people who have irritable bowel syndrome (IBS) and certain other bowel (intestinal) diseases to manage their symptoms. This meal plan can be complicated to follow. Work with a diet and nutrition specialist (dietitian) to make a low-FODMAP eating plan that is right for you. A dietitian can help make sure that you get enough nutrition from this diet. What are tips for following this plan? Reading food labels  Check labels for hidden FODMAPs such as: ? High-fructose syrup. ? Honey. ? Agave. ? Natural fruit flavors. ? Onion or garlic powder.  Choose low-FODMAP foods that contain 3-4 grams of fiber per serving.  Check food labels for serving sizes. Eat only one serving at a time to make sure FODMAP levels stay low. Shopping  Shop with a list of foods that are recommended on this diet and make a meal plan. Meal planning  Follow a low-FODMAP eating plan for up to 6 weeks, or as told by your health care provider or dietitian.  To follow the eating plan: 1. Eliminate high-FODMAP foods from your diet completely. Choose only low-FODMAP foods to eat. You will do this for 2-6 weeks. 2. Gradually reintroduce high-FODMAP foods into your diet one at a time. Most people should wait a few days before introducing the next new high-FODMAP food into their meal plan. Your dietitian can recommend how quickly you may reintroduce foods. 3. Keep a daily record of what and how much you eat and drink. Make note of any symptoms that you have after eating. 4. Review your daily record with a dietitian regularly to identify which foods you can eat and which foods you should avoid. General tips  Drink enough fluid each day to keep your urine  pale yellow.  Avoid processed foods. These often have added sugar and may be high in FODMAPs.  Avoid most dairy products, whole grains, and sweeteners.  Work with a dietitian to make sure you get enough fiber in your diet.  Avoid high FODMAP foods at meals to manage symptoms. Recommended foods Fruits Bananas, oranges, tangerines, lemons, limes, blueberries, raspberries, strawberries, grapes, cantaloupe, honeydew melon, kiwi, papaya, passion fruit, and pineapple. Limited amounts of dried cranberries, banana chips, and shredded coconut. Vegetables Eggplant, zucchini, cucumber, peppers, green beans, bean sprouts, lettuce, arugula, kale, Swiss chard, spinach, collard greens, bok choy, summer squash, potato, and tomato. Limited amounts of corn, carrot, and sweet potato. Green parts of scallions. Grains Gluten-free grains, such as rice, oats, buckwheat, quinoa, corn, polenta, and millet. Gluten-free pasta, bread, or cereal. Rice noodles. Corn tortillas. Meats and other proteins Unseasoned beef, pork, poultry, or fish. Eggs. Berniece Salines. Tofu (firm) and tempeh. Limited amounts of nuts and seeds, such as almonds, walnuts, [place] nuts, pecans, peanuts, nut butters, pumpkin seeds, chia seeds, and sunflower seeds. Dairy Lactose-free milk, yogurt, and kefir. Lactose-free cottage cheese and ice cream. Non-dairy milks, such as almond, coconut, hemp, and rice milk. Non-dairy yogurt. Limited amounts of goat cheese, brie, mozzarella, parmesan, swiss, and other hard cheeses. Fats and oils Butter-free spreads. Vegetable oils, such as olive, canola, and sunflower oil. Seasoning and other foods Artificial sweeteners with names that do not end in "ol," such as aspartame, saccharine, and stevia. Maple syrup, white table sugar, raw sugar, brown sugar, and  molasses. Mayonnaise, soy sauce, and tamari. Fresh basil, coriander, parsley, rosemary, and thyme. Beverages Water and mineral water. Sugar-sweetened soft drinks. Small  amounts of orange juice or cranberry juice. Black and green tea. Most dry wines. Coffee. The items listed above may not be a complete list of foods and beverages you can eat. Contact a dietitian for more information. Foods to avoid Fruits Fresh, dried, and juiced forms of apple, pear, watermelon, peach, plum, cherries, apricots, blackberries, boysenberries, figs, nectarines, and mango. Avocado. Vegetables Chicory root, artichoke, asparagus, cabbage, snow peas, Brussels sprouts, broccoli, sugar snap peas, mushrooms, celery, and cauliflower. Onions, garlic, leeks, and the white part of scallions. Grains Wheat, including kamut, durum, and semolina. Barley and bulgur. Couscous. Wheat-based cereals. Wheat noodles, bread, crackers, and pastries. Meats and other proteins Fried or fatty meat. Sausage. Cashews and pistachios. Soybeans, baked beans, black beans, chickpeas, kidney beans, fava beans, navy beans, lentils, black-eyed peas, and split peas. Dairy Milk, yogurt, ice cream, and soft cheese. Cream and sour cream. Milk-based sauces. Custard. Buttermilk. Soy milk. Seasoning and other foods Any sugar-free gum or candy. Foods that contain artificial sweeteners such as sorbitol, mannitol, isomalt, or xylitol. Foods that contain honey, high-fructose corn syrup, or agave. Bouillon, vegetable stock, beef stock, and chicken stock. Garlic and onion powder. Condiments made with onion, such as hummus, chutney, pickles, relish, salad dressing, and salsa. Tomato paste. Beverages Chicory-based drinks. Coffee substitutes. Chamomile tea. Fennel tea. Sweet or fortified wines such as port or sherry. Diet soft drinks made with isomalt, mannitol, maltitol, sorbitol, or xylitol. Apple, pear, and mango juice. Juices with high-fructose corn syrup. The items listed above may not be a complete list of foods and beverages you should avoid. Contact a dietitian for more information. Summary  FODMAP stands for fermentable  oligosaccharides, disaccharides, monosaccharides, and polyols. These are sugars that are hard for some people to digest.  A low-FODMAP eating plan is a short-term diet that helps to ease symptoms of certain bowel diseases.  The eating plan usually lasts up to 6 weeks. After that, high-FODMAP foods are reintroduced gradually and one at a time. This can help you find out which foods may be causing symptoms.  A low-FODMAP eating plan can be complicated. It is best to work with a dietitian who has experience with this type of plan. This information is not intended to replace advice given to you by your health care provider. Make sure you discuss any questions you have with your health care provider. Document Revised: 08/12/2019 Document Reviewed: 08/12/2019 Elsevier Patient Education  [place].

## 2020-04-19 NOTE — Progress Notes (Signed)
Assessment and Plan:  Angela Dixon was seen today for medication reaction.  Diagnoses and all orders for this visit:  Stress and adjustment reaction Generalized anxiety disorder Moderate episode of recurrent major depressive disorder (HCC) Well managed by current regimen; continue medications Continue counseling Stress management techniques discussed Resume routine follow up or sooner PRN -     venlafaxine XR (EFFEXOR XR) 75 MG 24 hr capsule; Take 1 cap daily  Further disposition pending results of labs. Discussed med's effects and SE's.   Over 30 minutes of exam, counseling, chart review, and critical decision making was performed.   Future Appointments  Date Time Provider [place]  08/29/2020 10:30 AM Liane Comber, NP [facility] None  02/14/2021 10:00 AM Liane Comber, NP [facility] None    ------------------------------------------------------------------------------------------------------------------   HPI BP 122/76   Pulse 93   Temp (!) 96.3 F (35.7 C)   Wt 268 lb 6.4 oz (121.7 kg)   SpO2 98%   BMI 46.07 kg/m   34 y.o.female with hx of recurrent major depression presents follow up after med adjustment.   She was started on effexor for depression in 2020 while in school, was up to 150 mg which was initially working well, but admits stress with kids, work, trying to pass a test and was having more down days, poor motivation/energy, had gained a lot of weight and wanted to try alternative agent. Tapered effexor then initiated wellbutrin 150 mg however developed hives and d/c'd. Discussed alternatives, but didn't tolerate zofran remotely, preferred to restart effexor. She has tapered up to 75 mg daily and reports doing much better.   Denies SI/HI. Sleeps fairly with benadryl and melanonin when needed.   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 She has a kindergarten son and 1st grader. Newly divorced and happy. Too busy  to date.  She has started working with previously Pharmacist, hospital.   BMI is Body mass index is 46.07 kg/m., she has been working on diet and exercise, just joined [employer], enjoying, thinks helpful, mostly just trying to make better choices for body, goal <200 lb. Has switched from coke to coke zero.  Wt Readings from Last 3 Encounters:  04/19/20 268 lb 6.4 oz (121.7 kg)  02/23/20 269 lb (122 kg)  02/22/20 264 lb 8.8 oz (120 kg)    Last year was having nausea/vomiting, gastritis, GERD; she was drinking excessively, smoking marijuana after separation; US showed normal gallbladder, did show fatty liver. She was referred to GI, had CT that showed rotated IUD, has seen GYN, but ultimately sx improved after quitting marijuana. GERD well controlled on protonix. Also IBS with loose stools, but GI felt due to improvement no further workup needed at this time. Has reduced alcohol from 5 drinks a night to 2-3/night. Was prescribed naltrexone last year, continue to slowly reduce.    Past Medical History:  Diagnosis Date  . Abnormal Pap smear   . Acid reflux   . Allergy   . Anemia   . Anxiety   . Cannabinoid hyperemesis syndrome 02/28/2019  . Chlamydia 2006  . Depression   . Fracture   . Hypercholesterolemia   . IBS (irritable bowel syndrome)   . Vitamin D deficiency      Allergies  Allergen Reactions  . Wellbutrin [Bupropion] Hives  . Zoloft [Sertraline] Other (See Comments)    Excessive sweating, insomnia  . Neomycin Rash    Current Outpatient Medications on File Prior to Visit  Medication Sig  .  albuterol (PROVENTIL HFA;VENTOLIN HFA) 108 (90 Base) MCG/ACT inhaler Inhale 2 puffs into the lungs every 4 hours as needed for wheezing or shortness of breath. (Patient taking differently: Inhale 2 puffs into the lungs every 4 (four) hours as needed for wheezing or shortness of breath.)  . cetirizine (ZYRTEC) 10 MG tablet Take 10 mg by mouth daily.  . Cholecalciferol 125 MCG (5000 UT)  capsule Take 2 capsules (10,000 Units total) by mouth daily.  . Cyanocobalamin (B-12 PO) Take 1 tablet by mouth daily.  . Ferrous Sulfate (IRON PO) Take 1 tablet by mouth daily.  . Flaxseed, Linseed, (FLAX SEED OIL) 1000 MG CAPS Take 1,000 mg by mouth daily.   . Multiple Vitamin (MULTIVITAMIN WITH MINERALS) TABS tablet Take 1 tablet by mouth daily.  . pantoprazole (PROTONIX) 40 MG tablet Take 1 tablet Daily for Heart burn & Indigestion  . venlafaxine XR (EFFEXOR-XR) 75 MG 24 hr capsule Take     1 capsule     Daily        for Mood  . hydrocortisone cream 1 % Apply 1 application topically 4 (four) times daily as needed for itching. (Patient not taking: Reported on 04/19/2020)  . polyvinyl alcohol (LIQUIFILM TEARS) 1.4 % ophthalmic solution Place 1 drop into both eyes as needed for dry eyes. (Patient not taking: Reported on 04/19/2020)   No current facility-administered medications on file prior to visit.    ROS: all negative except above.   Physical Exam:  BP 122/76   Pulse 93   Temp (!) 96.3 F (35.7 C)   Wt 268 lb 6.4 oz (121.7 kg)   SpO2 98%   BMI 46.07 kg/m   General Appearance: Well nourished, in no acute distress. Eyes: PERRLA, EOMs, conjunctiva no swelling or erythema Sinuses: No Frontal/maxillary tenderness ENT/Mouth: Ext aud canals clear, TMs without erythema, bulging. No erythema, swelling, or exudate on post pharynx.  Tonsils not swollen or erythematous. Hearing normal.  Neck: Supple, thyroid normal.  Respiratory: Respiratory effort normal, BS equal bilaterally without rales, rhonchi, wheezing or stridor.  Cardio: RRR with no MRGs. Brisk peripheral pulses without edema.  Abdomen: Soft, + BS.  Non tender, no guarding, rebound, hernias, masses. Lymphatics: Non tender without lymphadenopathy.  Musculoskeletal: normal gait.  Skin: Warm, dry; she has no rash, lesions, ecchymosis.  Neuro: Cranial nerves intact. Normal muscle tone, no cerebellar symptoms. Sensation intact.   Psych: Awake and oriented X 3, normall affect, Insight and Judgment appropriate.     [name], NP 4:10 PM [facility]

## 2020-06-30 ENCOUNTER — Other Ambulatory Visit: Payer: Self-pay | Admitting: Internal Medicine

## 2020-07-04 ENCOUNTER — Other Ambulatory Visit: Payer: Self-pay | Admitting: Adult Health

## 2020-07-04 MED ORDER — FLUOXETINE HCL 10 MG PO TABS: 10.0000 mg | ORAL_TABLET | Freq: Every day | ORAL | 0 refills | Status: DC

## 2020-07-09 ENCOUNTER — Emergency Department (HOSPITAL_COMMUNITY): Payer: 59

## 2020-07-09 ENCOUNTER — Other Ambulatory Visit: Payer: Self-pay

## 2020-07-09 ENCOUNTER — Encounter (HOSPITAL_COMMUNITY): Payer: Self-pay | Admitting: Emergency Medicine

## 2020-07-09 ENCOUNTER — Emergency Department (HOSPITAL_COMMUNITY)
Admission: EM | Admit: 2020-07-09 | Discharge: 2020-07-09 | Disposition: A | Discharge status: Home / Self Care | Payer: 59 | Attending: Emergency Medicine | Admitting: Emergency Medicine

## 2020-07-09 DIAGNOSIS — N390 Urinary tract infection, site not specified: Secondary | ICD-10-CM

## 2020-07-09 DIAGNOSIS — Z87891 Personal history of nicotine dependence: Secondary | ICD-10-CM | POA: Diagnosis not present

## 2020-07-09 DIAGNOSIS — R109 Unspecified abdominal pain: Secondary | ICD-10-CM | POA: Diagnosis present

## 2020-07-09 DIAGNOSIS — R112 Nausea with vomiting, unspecified: Secondary | ICD-10-CM | POA: Diagnosis not present

## 2020-07-09 LAB — CBC WITH DIFFERENTIAL/PLATELET
Abs Immature Granulocytes: 0.06 10*3/uL (ref 0.00–0.07)
Basophils Absolute: 0 10*3/uL (ref 0.0–0.1)
Basophils Relative: 0 %
Eosinophils Absolute: 0 10*3/uL (ref 0.0–0.5)
Eosinophils Relative: 0 %
HCT: 40.8 % (ref 36.0–46.0)
Hemoglobin: 13.3 g/dL (ref 12.0–15.0)
Immature Granulocytes: 0 %
Lymphocytes Relative: 6 %
Lymphs Abs: 1 10*3/uL (ref 0.7–4.0)
MCH: 29 pg (ref 26.0–34.0)
MCHC: 32.6 g/dL (ref 30.0–36.0)
MCV: 88.9 fL (ref 80.0–100.0)
Monocytes Absolute: 1.2 10*3/uL — ABNORMAL HIGH (ref 0.1–1.0)
Monocytes Relative: 8 %
Neutro Abs: 13.6 10*3/uL — ABNORMAL HIGH (ref 1.7–7.7)
Neutrophils Relative %: 86 %
Platelets: 277 10*3/uL (ref 150–400)
RBC: 4.59 MIL/uL (ref 3.87–5.11)
RDW: 13.7 % (ref 11.5–15.5)
WBC: 15.9 10*3/uL — ABNORMAL HIGH (ref 4.0–10.5)
nRBC: 0 % (ref 0.0–0.2)

## 2020-07-09 LAB — URINALYSIS, ROUTINE W REFLEX MICROSCOPIC
Bilirubin Urine: NEGATIVE
Glucose, UA: NEGATIVE mg/dL
Hgb urine dipstick: NEGATIVE
Ketones, ur: NEGATIVE mg/dL
Nitrite: NEGATIVE
Protein, ur: 100 mg/dL — AB
Specific Gravity, Urine: 1.005 (ref 1.005–1.030)
pH: 7 (ref 5.0–8.0)

## 2020-07-09 LAB — COMPREHENSIVE METABOLIC PANEL
ALT: 21 U/L (ref 0–44)
AST: 30 U/L (ref 15–41)
Albumin: 4.3 g/dL (ref 3.5–5.0)
Alkaline Phosphatase: 63 U/L (ref 38–126)
Anion gap: 14 (ref 5–15)
BUN: 14 mg/dL (ref 6–20)
CO2: 18 mmol/L — ABNORMAL LOW (ref 22–32)
Calcium: 9 mg/dL (ref 8.9–10.3)
Chloride: 109 mmol/L (ref 98–111)
Creatinine, Ser: 1.37 mg/dL — ABNORMAL HIGH (ref 0.44–1.00)
GFR, Estimated: 52 mL/min — ABNORMAL LOW (ref >60)
Glucose, Bld: 131 mg/dL — ABNORMAL HIGH (ref 70–99)
Potassium: 4 mmol/L (ref 3.5–5.1)
Sodium: 141 mmol/L (ref 135–145)
Total Bilirubin: 0.8 mg/dL (ref 0.3–1.2)
Total Protein: 7.9 g/dL (ref 6.5–8.1)

## 2020-07-09 LAB — I-STAT BETA HCG BLOOD, ED (MC, WL, AP ONLY): I-stat hCG, quantitative: <5 m[IU]/mL (ref <5)

## 2020-07-09 MED ORDER — SODIUM CHLORIDE 0.9 % IV SOLN: Freq: Once | INTRAVENOUS | Status: AC

## 2020-07-09 MED ORDER — SODIUM CHLORIDE 0.9 % IV BOLUS (SEPSIS)
1000.0000 mL | Freq: Once | INTRAVENOUS | Status: AC
  Administered 2020-07-09: 1000 mL via INTRAVENOUS

## 2020-07-09 MED ORDER — PROMETHAZINE HCL 25 MG RE SUPP: 25.0000 mg | Freq: Four times a day (QID) | RECTAL | 0 refills | Status: DC | PRN

## 2020-07-09 MED ORDER — DIPHENHYDRAMINE HCL 50 MG/ML IJ SOLN
25.0000 mg | Freq: Once | INTRAMUSCULAR | Status: AC
  Administered 2020-07-09: 25 mg via INTRAVENOUS
  Filled 2020-07-09: qty 1

## 2020-07-09 MED ORDER — CEPHALEXIN 500 MG PO CAPS: 500.0000 mg | ORAL_CAPSULE | Freq: Four times a day (QID) | ORAL | 0 refills | Status: AC

## 2020-07-09 MED ORDER — SODIUM CHLORIDE 0.9 % IV SOLN
1000.0000 mL | INTRAVENOUS | Status: DC
  Administered 2020-07-09: 1000 mL via INTRAVENOUS

## 2020-07-09 MED ORDER — METOCLOPRAMIDE HCL 5 MG/ML IJ SOLN
10.0000 mg | Freq: Once | INTRAMUSCULAR | Status: AC
  Administered 2020-07-09: 10 mg via INTRAVENOUS
  Filled 2020-07-09: qty 2

## 2020-07-09 NOTE — Discharge Instructions (Signed)
Return if any problems.

## 2020-07-09 NOTE — ED Provider Notes (Signed)
[facility] DEPT Provider Note   CSN: [id number] Arrival date & time: 07/09/20  8101     History No chief complaint on file.   Angela [facility] is a 35 y.o. female.  The history is provided by the patient. No language interpreter was used.  Flank Pain This is a new problem. The current episode started yesterday. The problem occurs constantly. The problem has been gradually worsening. Nothing aggravates the symptoms. Nothing relieves the symptoms. She has tried nothing for the symptoms. The treatment provided no relief.  Pt reports recent marijuana use.  Pt has a history of hyperemesis second to [facility].       Past Medical History:  Diagnosis Date  . Abnormal Pap smear   . Acid reflux   . Allergy   . Anemia   . Anxiety   . Cannabinoid hyperemesis syndrome 02/28/2019  . Chlamydia 2006  . Depression   . Fracture   . Hypercholesterolemia   . IBS (irritable bowel syndrome)   . Vitamin D deficiency     Patient Active Problem List   Diagnosis Date Noted  . Elevated BP without diagnosis of hypertension 01/27/2020  . Morbid obesity ([place]) 01/26/2020  . Excessive drinking of alcohol 09/29/2018  . Stress and adjustment reaction 09/29/2018  . Irritable bowel syndrome (IBS) 05/12/2018  . GERD (gastroesophageal reflux disease) 01/09/2017  . Medication management 01/31/2015  . Depression 02/08/2014  . Generalized anxiety disorder 02/08/2014  . Vitamin D deficiency     Past Surgical History:  Procedure Laterality Date  . COLPOSCOPY  2006  . WISDOM TOOTH EXTRACTION       OB History    Gravida  2   Para  1   Term  1   Preterm      AB      Living  1     SAB      IAB      Ectopic      Multiple      Live Births  1           Family History  Problem Relation Age of Onset  . Hyperlipidemia Mother   . Osteopenia Mother   . Asthma Sister   . Hypertension Maternal Grandmother   . Diabetes Maternal Grandmother   . Heart disease  Maternal Grandfather   . Cancer Paternal Grandmother        throat, smoker  . Cancer Paternal Grandfather        lung, smoker  . Diverticulitis Father   . Colon cancer Neg Hx   . Esophageal cancer Neg Hx   . Stomach cancer Neg Hx   . Rectal cancer Neg Hx     Social History   Tobacco Use  . Smoking status: Former Smoker    Packs/day: 1.00    Types: Cigarettes    Quit date: 07/09/2006    Years since quitting: 14.0  . Smokeless tobacco: Never Used  Vaping Use  . Vaping Use: Never used  Substance Use Topics  . Alcohol use: Not Currently    Alcohol/week: 20.0 standard drinks    Types: 20 Standard drinks or equivalent per week  . Drug use: Not Currently    Types: Marijuana    Comment: last 05/2019    Home Medications Prior to Admission medications   Medication Sig Start Date End Date Taking? Authorizing Provider  albuterol (PROVENTIL HFA;VENTOLIN HFA) 108 (90 Base) MCG/ACT inhaler Inhale 2 puffs into the lungs every 4 hours as  needed for wheezing or shortness of breath. Patient taking differently: Inhale 2 puffs into the lungs every 4 (four) hours as needed for wheezing or shortness of breath. 01/16/17  Yes Unk Pinto, MD  cetirizine (ZYRTEC) 10 MG tablet Take 10 mg by mouth daily.   Yes [provider]  Cholecalciferol 125 MCG (5000 UT) capsule Take 2 capsules (10,000 Units total) by mouth daily. 01/28/20  Yes Liane Comber, NP  Cyanocobalamin (B-12 PO) Take 1 tablet by mouth daily.   Yes [provider]  Ferrous Sulfate (IRON PO) Take 1 tablet by mouth daily.   Yes [provider]  Flaxseed, Linseed, (FLAX SEED OIL) 1000 MG CAPS Take 1,000 mg by mouth daily.    Yes [provider]  Multiple Vitamin (MULTIVITAMIN WITH MINERALS) TABS tablet Take 1 tablet by mouth daily.   Yes [provider]  pantoprazole (PROTONIX) 40 MG tablet Take 1 tablet Daily for Heart burn & Indigestion 03/08/20  Yes Esterwood, Amy S, PA-C  venlafaxine  (EFFEXOR) 75 MG tablet Take 37.5 mg by mouth daily.   Yes [provider]  FLUoxetine (PROZAC) 10 MG tablet Take 1 tablet (10 mg total) by mouth daily. 07/04/20   Liane Comber, NP    Allergies    Wellbutrin [bupropion], Zoloft [sertraline], and Neomycin  Review of Systems   Review of Systems  Genitourinary: Positive for flank pain.  All other systems reviewed and are negative.   Physical Exam Updated Vital Signs BP (!) 142/76   Pulse 83   Temp 98.6 F (37 C) (Oral)   Resp 17   Ht 5\' 4"  (1.626 m)   Wt 120.2 kg   SpO2 99%   BMI 45.49 kg/m   Physical Exam Vitals and nursing note reviewed.  Constitutional:      Appearance: She is well-developed.  HENT:     Head: Normocephalic.     Nose: Nose normal.     Mouth/Throat:     Mouth: Mucous membranes are moist.  Cardiovascular:     Rate and Rhythm: Normal rate and regular rhythm.  Pulmonary:     Effort: Pulmonary effort is normal.  Abdominal:     General: Abdomen is flat. There is no distension.  Musculoskeletal:        General: Normal range of motion.     Cervical back: Normal range of motion.     Comments: Tender right flank,   Skin:    General: Skin is warm.  Neurological:     General: No focal deficit present.     Mental Status: She is alert and oriented to person, place, and time.  Psychiatric:        Mood and Affect: Mood normal.     ED Results / Procedures / Treatments   Labs (all labs ordered are listed, but only abnormal results are displayed) Labs Reviewed  URINALYSIS, ROUTINE W REFLEX MICROSCOPIC - Abnormal; Notable for the following components:      Result Value   APPearance HAZY (*)    Protein, ur 100 (*)    Leukocytes,Ua MODERATE (*)    Bacteria, UA RARE (*)    All other components within normal limits  CBC WITH DIFFERENTIAL/PLATELET - Abnormal; Notable for the following components:   WBC 15.9 (*)    Neutro Abs 13.6 (*)    Monocytes Absolute 1.2 (*)    All other components within  normal limits  COMPREHENSIVE METABOLIC PANEL - Abnormal; Notable for the following components:   CO2 18 (*)  Glucose, Bld 131 (*)    Creatinine, Ser 1.37 (*)    GFR, Estimated 52 (*)    All other components within normal limits  I-STAT BETA HCG BLOOD, ED ([facility], [facility], [facility] ONLY)    EKG None  Radiology CT Renal Stone Study  Result Date: 07/09/2020 CLINICAL DATA:  Flank pain. Right-sided low back pain. Hyperemesis. EXAM: CT ABDOMEN AND PELVIS WITHOUT CONTRAST TECHNIQUE: Multidetector CT imaging of the abdomen and pelvis was performed following the standard protocol without IV contrast. COMPARISON:  CT abdomen and pelvis-02/24/2019 FINDINGS: The lack of intravenous contrast limits the ability to evaluate solid abdominal organs. Lower chest: Limited visualization of the lower thorax demonstrates minimal dependent subpleural ground-glass atelectasis, most conspicuous within the left lower lobe. No discrete focal airspace opacities. No pleural effusion. Normal heart size.  No pericardial effusion. Hepatobiliary: Normal hepatic contour. Normal noncontrast appearance of the gallbladder given degree of distention. No radiopaque gallstones. No ascites. Pancreas: Normal noncontrast appearance of the pancreas. Spleen: Normal noncontrast appearance of the spleen. Adrenals/Urinary Tract: Note is made of a slightly exaggerated horizontal lie of the right kidney. There is a very minimal amount of grossly symmetric likely body habitus related perinephric stranding. No renal stones. No renal stones are seen along the expected course of either ureter or within the urinary bladder. No evidence of urinary obstruction. Normal noncontrast appearance the bilateral adrenal glands. Normal noncontrast appearance the urinary bladder given degree of distention. Stomach/Bowel: Bowel is normal in course and caliber without discrete area of wall thickening on this noncontrast examination. No evidence of enteric obstruction. Normal  noncontrast appearance of the terminal ileum and the appendix. No pneumoperitoneum, pneumatosis or portal venous gas. Vascular/Lymphatic: Normal caliber of the abdominal aorta. No bulky retroperitoneal, mesenteric, pelvic or inguinal lymphadenopathy on this noncontrast examination. Reproductive: Appropriately positioned intrauterine device. No discrete adnexal lesion on this noncontrast study. No free fluid the pelvic cul-de-sac. Other: Tiny mesenteric fat containing periumbilical hernia. There is a minimal amount of subcutaneous edema about the midline of the low back. Musculoskeletal: No acute or aggressive osseous abnormalities. Mild increased sclerosis about the bilateral SI joints, right greater than left. IMPRESSION: 1. No explanation for patient's right-sided flank pain. Specifically, no evidence of nephrolithiasis, urinary or enteric obstruction. Normal noncontrast appearance of the appendix. 2. Appropriately positioned intrauterine device. Electronically Signed   By: Sandi Mariscal M.D.   On: 07/09/2020 11:28    Procedures Procedures   Medications Ordered in ED Medications  sodium chloride 0.9 % bolus 1,000 mL (0 mLs Intravenous Stopped 07/09/20 1220)    Followed by  0.9 %  sodium chloride infusion (1,000 mLs Intravenous New Bag/Given 07/09/20 1100)  0.9 %  sodium chloride infusion ( Intravenous Stopped 07/09/20 0942)  metoCLOPramide (REGLAN) injection 10 mg (10 mg Intravenous Given 07/09/20 0843)  diphenhydrAMINE (BENADRYL) injection 25 mg (25 mg Intravenous Given 07/09/20 3151)    ED Course  I have reviewed the triage vital signs and the nursing notes.  Pertinent labs & imaging results that were available during my care of the patient were reviewed by me and considered in my medical decision making (see chart for details).    MDM Rules/Calculators/A&P                          Pt given iv fluids x 2 liters. Pt report nausea has resolved.  Ct scan no kidney stone.  Final Clinical Impression(s)  / ED Diagnoses Final diagnoses:  Nausea and  vomiting, intractability of vomiting not specified, unspecified vomiting type  Urinary tract infection without hematuria, site unspecified    Rx / DC Orders ED Discharge Orders         Ordered    promethazine (PHENERGAN) 25 MG suppository  Every 6 hours PRN        07/09/20 1322    cephALEXin (KEFLEX) 500 MG capsule  4 times daily        07/09/20 1322        An After Visit Summary was printed and given to the patient.    Fransico Meadow, [place] 07/09/20 1323    Wyvonnia Dusky, MD 07/09/20 [phone]

## 2020-07-09 NOTE — ED Triage Notes (Signed)
Patient complains of emesis and lower back pain. Denies issues with urination, burning or blood. Denies blood in her emesis. Endorses recent marijuana use on Friday, hx of hyperemesis.

## 2020-08-04 ENCOUNTER — Other Ambulatory Visit: Payer: Self-pay | Admitting: Adult Health

## 2020-08-04 DIAGNOSIS — M542 Cervicalgia: Secondary | ICD-10-CM

## 2020-08-07 ENCOUNTER — Other Ambulatory Visit: Payer: Self-pay | Admitting: Adult Health

## 2020-08-07 MED ORDER — FLUOXETINE HCL 20 MG PO TABS: 20.0000 mg | ORAL_TABLET | Freq: Every day | ORAL | 0 refills | Status: DC

## 2020-08-28 ENCOUNTER — Other Ambulatory Visit: Payer: Self-pay | Admitting: Physician Assistant

## 2020-08-28 NOTE — Progress Notes (Deleted)
Assessment and Plan:   Recurrent major depressive disorder, in partial remission (HCC)/ Generalized anxiety disorder - continue medications, stress management techniques discussed, increase water, good sleep hygiene discussed, increase exercise, and increase veggies.  -     TSH  Excessive drinking of alcohol Has decreased, continue to monitor labs  Gastroesophageal reflux disease, unspecified whether esophagitis present Continue PPI/H2 blocker, diet discussed  Mixed hyperlipidemia check lipids decrease fatty foods increase activity.  -     Lipid panel -     TSH  Vitamin D deficiency -     VITAMIN D 25 Hydroxy (Vit-D Deficiency, Fractures)  Medication management -     CBC with Differential/Platelet -     COMPLETE METABOLIC PANEL WITH GFR -     Magnesium   Future Appointments  Date Time Provider [place]  08/29/2020 10:30 AM Liane Comber, NP [facility] None  02/14/2021 10:00 AM Liane Comber, NP [facility] None     HPI 35 y.o.female with history of depression, anxiety, GERD, alcohol use, hyperlipidemia presents for follow up.   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 She has a kindergarten son and 1st grader. Newly divorced and happy. Too busy to date.  She was started on effexor for depression in 2020 while in school, had poor motivation and started wellbutrin but developed hives, hx of celexa and zoloft without benefit/poor tolerance, now taking prozac 20 mg daily and reports doing well.  Denies SI/HI. Sleeps fairly with benadryl and melanonin when needed. She has started working with previously Pharmacist, hospital.   Hx of N/V due to excessive alcohol use and marijuana use,  followed with GI and had EGD showing gastritis/esophagitis, neg H pylori.  She is still on the protonix. *** She has stopped smoking and has not had any nausea/vomiting symptoms.  She is drinking 3-4 days a week, 3 glasses of wine.    BMI is There is no height or weight on file to calculate BMI., she is working on diet and exercise. She admits to eating out more.  Wt Readings from Last 3 Encounters:  07/09/20 265 lb (120.2 kg)  04/19/20 268 lb 6.4 oz (121.7 kg)  02/23/20 269 lb (122 kg)   Her blood pressure has been controlled at home, today their BP is    She does not workout. She denies chest pain, shortness of breath, dizziness.   She is not on cholesterol medication and denies myalgias. Mom with high cholesterol, MGF and MGM with heart disease.   Her cholesterol is not at goal. The cholesterol last visit was:   Lab Results  Component Value Date   CHOL 244 (H) 01/27/2020   HDL 61 01/27/2020   LDLCALC 137 (H) 01/27/2020   TRIG 319 (H) 01/27/2020   CHOLHDL 4.0 01/27/2020   Last A1C in the office was:  Lab Results  Component Value Date   HGBA1C 5.0 01/27/2020   Patient is on Vitamin D supplement, she finished her RX and is now on 5000 IU daily.  Lab Results  Component Value Date   VD25OH 20 (L) 01/27/2020        Past Medical History:  Diagnosis Date  . Abnormal Pap smear   . Acid reflux   . Allergy   . Anemia   . Anxiety   . Cannabinoid hyperemesis syndrome 02/28/2019  . Chlamydia 2006  . Depression   . Fracture   . Hypercholesterolemia   . IBS (irritable bowel syndrome)   .  Vitamin D deficiency      Allergies  Allergen Reactions  . Wellbutrin [Bupropion] Hives  . Zoloft [Sertraline] Other (See Comments)    Excessive sweating, insomnia  . Neomycin Rash      Current Outpatient Medications (Respiratory):  .  albuterol (PROVENTIL HFA;VENTOLIN HFA) 108 (90 Base) MCG/ACT inhaler, Inhale 2 puffs into the lungs every 4 hours as needed for wheezing or shortness of breath. (Patient taking differently: Inhale 2 puffs into the lungs every 4 (four) hours as needed for wheezing or shortness of breath.) .  cetirizine (ZYRTEC) 10 MG tablet, Take 10 mg by mouth daily. .  promethazine (PHENERGAN) 25  MG suppository, Place 1 suppository (25 mg total) rectally every 6 (six) hours as needed for nausea or vomiting.   Current Outpatient Medications (Hematological):  Marland Kitchen  Cyanocobalamin (B-12 PO), Take 1 tablet by mouth daily. .  Ferrous Sulfate (IRON PO), Take 1 tablet by mouth daily.  Current Outpatient Medications (Other):  Marland Kitchen  Cholecalciferol 125 MCG (5000 UT) capsule, Take 2 capsules (10,000 Units total) by mouth daily. .  Flaxseed, Linseed, (FLAX SEED OIL) 1000 MG CAPS, Take 1,000 mg by mouth daily.  Marland Kitchen  FLUoxetine (PROZAC) 20 MG tablet, Take 1 tablet (20 mg total) by mouth daily. .  Multiple Vitamin (MULTIVITAMIN WITH MINERALS) TABS tablet, Take 1 tablet by mouth daily. .  pantoprazole (PROTONIX) 40 MG tablet, Take 1 tablet Daily for Heart burn & Indigestion  ROS: all negative except above.   Physical Exam: There were no vitals filed for this visit. There were no vitals taken for this visit. General Appearance: Well nourished, in no apparent distress. Eyes: PERRLA, EOMs, conjunctiva no swelling or erythema Sinuses: No Frontal/maxillary tenderness ENT/Mouth: Ext aud canals clear, TMs without erythema, bulging. Mouth and nose not examined- patient wearing a facemask Hearing normal.  Neck: Supple, thyroid normal.  Respiratory: Respiratory effort normal, BS equal bilaterally without rales, rhonchi, wheezing or stridor.  Cardio: RRR with no MRGs. Brisk peripheral pulses without edema.  Abdomen: Soft, + BS.  Non tender, no guarding, rebound, hernias, masses. Lymphatics: Non tender without lymphadenopathy.  Musculoskeletal: Full ROM, 5/5 strength, normal gait.  Skin: Warm, dry without rashes, lesions, ecchymosis.  Neuro: Cranial nerves intact. Normal muscle tone, no cerebellar symptoms. Sensation intact.  Psych: Awake and oriented X 3, normal affect, Insight and Judgment appropriate.     [name], NP 8:21 AM [facility]

## 2020-08-29 ENCOUNTER — Ambulatory Visit: Payer: 59 | Admitting: Adult Health

## 2020-08-30 ENCOUNTER — Other Ambulatory Visit: Payer: Self-pay

## 2020-08-30 MED ORDER — FLUOXETINE HCL 20 MG PO TABS: 20.0000 mg | ORAL_TABLET | Freq: Every day | ORAL | 0 refills | Status: DC

## 2020-09-02 ENCOUNTER — Other Ambulatory Visit: Payer: Self-pay | Admitting: Internal Medicine

## 2020-09-14 NOTE — Progress Notes (Signed)
Assessment and Plan:   Recurrent major depressive disorder, in partial remission (HCC)/ Generalized anxiety disorder Much improved with prozac 20 mg daily; continue current regimen She has access to counselor with perceived benefit Stress management techniques discussed, increase water, good sleep hygiene discussed, increase exercise, and increase veggies.   Excessive drinking of alcohol Has decreased, continue to evaluate at each visit Try to limit to 1/day, <4/week  Gastroesophageal reflux disease, unspecified whether esophagitis present she has stopped marijuana and reduced ETOH with improved sx GI has follow up scheduled next week Continue PPI for now; discussed possible taper plan to [facility] if continues to do well but will defer decision to GI Discussed diet, avoiding triggers and other lifestyle changes  Mixed hyperlipidemia She has been unable to work on lifestyle; motivated to start Would not start medication at this time Defer checking today;  Discussed low saturated fat diet, increase fiber, weight loss Plan to check later this year at [facility]  Vitamin D deficiency Restart regular supplement for goal range 60-10 Discussed with patient, defer check today  Morbid Obesity ([place]) - BMI 44 She is making slow sustainable changes with weight slowly trending down Long discussion about weight loss, diet, and exercise Recommended minimally processed plant heavy diet She has perceived benefit with noom app, plans to restart She also plans to increase walking and start video exercise program Encouraged small sustainable changes, building lifestyle habits Discussed initial goal (<250 lb) Follow up at next visit   Future Appointments  Date Time Provider [place]  09/21/2020 11:00 AM Levin Erp, PA [facility]  02/14/2021 10:00 AM Liane Comber, NP [facility] None     HPI 35 y.o.female with history of depression, anxiety, GERD, alcohol use,  hyperlipidemia presents for  6 month follow up.   She is with new accounting job, just getting through busy season.   She was started on effexor for depression in 2020 while in school, had poor motivation and started wellbutrin but developed hives, hx of celexa and zoloft without benefit/poor tolerance, now taking prozac 20 mg daily and reports doing well. Denies SI/HI. Sleeps fairly with benadryl and melanonin when needed. She did work with counselor but stopped due to busy season at work.   Hx of N/V due to excessive alcohol use and marijuana use,  followed with GI and had EGD showing gastritis/esophagitis, neg H pylori. She has stopped smoking and reports has not had any nausea/vomiting symptoms. She is drinking ETOH 3-4 days a week, 2-3 glasses of wine. She has follow up with GI planned next week. She continues with pantoprazole 40 mg daily and denies GERD sx.   BMI is Body mass index is 44.97 kg/m., she is working on diet and, was taking a break but plans to get restarted with noom which was helpful in the past. Trying to eat out less. Trying to work up on walking and plans to start doing a video exercise program.  Wt Readings from Last 3 Encounters:  09/15/20 262 lb (118.8 kg)  07/09/20 265 lb (120.2 kg)  04/19/20 268 lb 6.4 oz (121.7 kg)   Her blood pressure has been controlled at home, today their BP is BP: 130/80  She does not workout. She denies chest pain, shortness of breath, dizziness.   She is not on cholesterol medication and denies myalgias. Mom with high cholesterol, MGF and MGM with heart disease.   Her cholesterol is not at goal. The cholesterol last visit was:   Lab Results  Component Value Date   CHOL 244 (H) 01/27/2020   HDL 61 01/27/2020   LDLCALC 137 (H) 01/27/2020   TRIG 319 (H) 01/27/2020   CHOLHDL 4.0 01/27/2020   Last A1C in the office was:  Lab Results  Component Value Date   HGBA1C 5.0 01/27/2020   Patient is on Vitamin D supplement, she finished her RX  and is now on 5000 IU daily but admits hasn't been taking regularly.  Lab Results  Component Value Date   VD25OH 20 (L) 01/27/2020        Past Medical History:  Diagnosis Date   Abnormal Pap smear    Acid reflux    Allergy    Anemia    Anxiety    Cannabinoid hyperemesis syndrome 02/28/2019   Chlamydia 2006   Depression    Fracture    Hypercholesterolemia    IBS (irritable bowel syndrome)    Vitamin D deficiency      Allergies  Allergen Reactions   Wellbutrin [Bupropion] Hives   Zoloft [Sertraline] Other (See Comments)    Excessive sweating, insomnia   Neomycin Rash      Current Outpatient Medications (Respiratory):    albuterol (PROVENTIL HFA;VENTOLIN HFA) 108 (90 Base) MCG/ACT inhaler, Inhale 2 puffs into the lungs every 4 hours as needed for wheezing or shortness of breath. (Patient taking differently: Inhale 2 puffs into the lungs every 4 (four) hours as needed for wheezing or shortness of breath.)   cetirizine (ZYRTEC) 10 MG tablet, Take 10 mg by mouth daily.   promethazine (PHENERGAN) 25 MG suppository, Place 1 suppository (25 mg total) rectally every 6 (six) hours as needed for nausea or vomiting.   Current Outpatient Medications (Hematological):    Cyanocobalamin (B-12 PO), Take 1 tablet by mouth daily.   Ferrous Sulfate (IRON PO), Take 1 tablet by mouth daily.  Current Outpatient Medications (Other):    Cholecalciferol 125 MCG (5000 UT) capsule, Take 2 capsules (10,000 Units total) by mouth daily.   Flaxseed, Linseed, (FLAX SEED OIL) 1000 MG CAPS, Take 1,000 mg by mouth daily.    FLUoxetine (PROZAC) 20 MG tablet, Take 1 tablet (20 mg total) by mouth daily.   Multiple Vitamin (MULTIVITAMIN WITH MINERALS) TABS tablet, Take 1 tablet by mouth daily.   pantoprazole (PROTONIX) 40 MG tablet, Take 1 tablet Daily for Heart burn & Indigestion  ROS: all negative except above.   Physical Exam: Filed Weights   09/15/20 1109  Weight: 262 lb (118.8 kg)   BP 130/80    Pulse 92   Temp 97.7 F (36.5 C)   Ht 5\' 4"  (1.626 m)   Wt 262 lb (118.8 kg)   SpO2 99%   BMI 44.97 kg/m   General Appearance: Well nourished, in no apparent distress. Eyes: PERRLA, EOMs, conjunctiva no swelling or erythema Sinuses: No Frontal/maxillary tenderness ENT/Mouth: Ext aud canals clear, TMs without erythema, bulging. No erythema, swelling, or exudate on post pharynx.  Tonsils not swollen or erythematous. Hearing normal.  Neck: Supple, thyroid normal.  Respiratory: Respiratory effort normal, BS equal bilaterally without rales, rhonchi, wheezing or stridor.  Cardio: RRR with no MRGs. Brisk peripheral pulses without edema.  Abdomen: Soft, obese abdomen, + BS.  Non tender, no guarding, rebound, hernias, masses. Lymphatics: Non tender without lymphadenopathy.  Musculoskeletal: Full ROM, 5/5 strength, normal gait.  Skin: Warm, dry without rashes, lesions, ecchymosis.  Neuro: Cranial nerves intact. Normal muscle tone, no cerebellar symptoms. Sensation intact.  Psych: Awake and oriented X 3, normal affect,  Insight and Judgment appropriate.     Angela Ribas, NP 11:41 AM [facility]

## 2020-09-15 ENCOUNTER — Other Ambulatory Visit: Payer: Self-pay

## 2020-09-15 ENCOUNTER — Encounter: Payer: Self-pay | Admitting: Adult Health

## 2020-09-15 ENCOUNTER — Ambulatory Visit (INDEPENDENT_AMBULATORY_CARE_PROVIDER_SITE_OTHER): Payer: 59 | Admitting: Adult Health

## 2020-09-15 VITALS — BP 130/80 | HR 92 | Temp 97.7°F | Ht 64.0 in | Wt 262.0 lb

## 2020-09-15 DIAGNOSIS — E559 Vitamin D deficiency, unspecified: Secondary | ICD-10-CM

## 2020-09-15 DIAGNOSIS — F331 Major depressive disorder, recurrent, moderate: Secondary | ICD-10-CM

## 2020-09-15 DIAGNOSIS — Z79899 Other long term (current) drug therapy: Secondary | ICD-10-CM

## 2020-09-15 DIAGNOSIS — F1010 Alcohol abuse, uncomplicated: Secondary | ICD-10-CM | POA: Diagnosis not present

## 2020-09-15 DIAGNOSIS — K219 Gastro-esophageal reflux disease without esophagitis: Secondary | ICD-10-CM

## 2020-09-15 DIAGNOSIS — F411 Generalized anxiety disorder: Secondary | ICD-10-CM | POA: Diagnosis not present

## 2020-09-15 DIAGNOSIS — F4329 Adjustment disorder with other symptoms: Secondary | ICD-10-CM

## 2020-09-21 ENCOUNTER — Ambulatory Visit (INDEPENDENT_AMBULATORY_CARE_PROVIDER_SITE_OTHER): Payer: 59 | Admitting: Physician Assistant

## 2020-09-21 ENCOUNTER — Encounter: Payer: Self-pay | Admitting: Physician Assistant

## 2020-09-21 VITALS — BP 128/70 | HR 76 | Ht 64.0 in | Wt 266.0 lb

## 2020-09-21 DIAGNOSIS — K219 Gastro-esophageal reflux disease without esophagitis: Secondary | ICD-10-CM | POA: Diagnosis not present

## 2020-09-21 DIAGNOSIS — K580 Irritable bowel syndrome with diarrhea: Secondary | ICD-10-CM | POA: Diagnosis not present

## 2020-09-21 MED ORDER — HYOSCYAMINE SULFATE 0.125 MG PO TBDP: 0.1250 mg | ORAL_TABLET | Freq: Four times a day (QID) | ORAL | 2 refills | Status: DC | PRN

## 2020-09-21 MED ORDER — PANTOPRAZOLE SODIUM 40 MG PO TBEC: DELAYED_RELEASE_TABLET | ORAL | 3 refills | Status: DC

## 2020-09-21 NOTE — Progress Notes (Signed)
Reviewed and agree with management plan.  Aurianna Earlywine T. Osmani Kersten, MD FACG [phone]  

## 2020-09-21 NOTE — Progress Notes (Signed)
Chief Complaint: Medication refill  HPI:    Angela Dixon is a 35 year old Caucasian female with a past medical history as listed below including anxiety, depression and IBS, known to Dr. Fuller Plan, who presents to clinic today for medication refill.    03/25/2019 EGD with LA grade B reflux esophagitis, erythematous mucosa in the gastric body and antrum and small hiatal hernia.  Pathology showed mild chronic gastritis.    06/17/2019 patient seen in clinic by [facility].  At that time was following up after having been seen for nausea and vomiting felt secondary to alcohol abuse as well as reflux and chronic IBS symptoms.  At that time and not been drinking or using marijuana.  She was in treatment for anxiety and depression and feeling better.  She continued to have loose bowel movements which were normal for many years sometimes 3-4 times a day.  Recent CT the abdomen pelvis in November 2020 was unremarkable other than rotated IUD and labs in January had shown unremarkable other than slightly elevated total eosinophil count.  At that time it was not thought she needed any further GI work-up.  Recommend she stop cannabis and alcohol encouraged to continue complete abstinence from both.  Continued on Protonix 40 mg every morning and she was given a prescription for Levsin sublingual every 6 hours to have on hand for abdominal cramping or exacerbations of IBS.    07/09/2020 patient seen in ED for nausea and vomiting as well as flank pain.  At that time had been using marijuana.  Urinalysis with moderate leukocytes.  CBC with a white count of 15.9.  CMP with creatinine 1.37.  CT renal stone study showed no explanation for patient's right-sided flank pain.  Patient was given IV fluids and nausea had resolved after Reglan and Benadryl she was sent home with Phenergan suppositories and Keflex for UTI.    Today, the patient tells me that she is doing fairly well.  Explains that a couple months ago she had a "slip  up", and did have some increased reflux symptoms around that time when she was drinking more alcohol but she has gotten back on track and everything seems to be okay.  This is as long as she uses her Pantoprazole 40 mg daily.  Tells me she will still have some breakthrough symptoms maybe once a month but does not want to change his medication.    Also continues with IBS and diarrhea.  Tells me she will have 2-3 loose stools a day and sometimes more occasionally needing Imodium to help slow her down.  Is requesting more Hyoscyamine to have on hand for abdominal cramping.    Denies fever, chills, weight loss, blood in her stool or symptoms that awaken her from sleep.  Past Medical History:  Diagnosis Date   Abnormal Pap smear    Acid reflux    Allergy    Anemia    Anxiety    Cannabinoid hyperemesis syndrome 02/28/2019   Chlamydia 2006   Depression    Fracture    Hypercholesterolemia    IBS (irritable bowel syndrome)    Vitamin D deficiency     Past Surgical History:  Procedure Laterality Date   COLPOSCOPY  2006   WISDOM TOOTH EXTRACTION      Current Outpatient Medications  Medication Sig Dispense Refill   albuterol (PROVENTIL HFA;VENTOLIN HFA) 108 (90 Base) MCG/ACT inhaler Inhale 2 puffs into the lungs every 4 hours as needed for wheezing or shortness of  breath. (Patient taking differently: Inhale 2 puffs into the lungs every 4 (four) hours as needed for wheezing or shortness of breath.) 3 Inhaler 0   cetirizine (ZYRTEC) 10 MG tablet Take 10 mg by mouth daily.     Cholecalciferol 125 MCG (5000 UT) capsule Take 2 capsules (10,000 Units total) by mouth daily.     Cyanocobalamin (B-12 PO) Take 1 tablet by mouth daily.     Ferrous Sulfate (IRON PO) Take 1 tablet by mouth daily.     Flaxseed, Linseed, (FLAX SEED OIL) 1000 MG CAPS Take 1,000 mg by mouth daily.      FLUoxetine (PROZAC) 20 MG tablet Take 1 tablet (20 mg total) by mouth daily. 90 tablet 0   Multiple Vitamin (MULTIVITAMIN WITH  MINERALS) TABS tablet Take 1 tablet by mouth daily.     pantoprazole (PROTONIX) 40 MG tablet Take 1 tablet Daily for Heart burn & Indigestion 90 tablet 1   promethazine (PHENERGAN) 25 MG suppository Place 1 suppository (25 mg total) rectally every 6 (six) hours as needed for nausea or vomiting. 12 each 0   No current facility-administered medications for this visit.    Allergies as of 09/21/2020 - Review Complete 09/15/2020  Allergen Reaction Noted   Wellbutrin [bupropion] Hives 02/23/2020   Zoloft [sertraline] Other (See Comments) 02/23/2020   Neomycin Rash 12/09/2013    Family History  Problem Relation Age of Onset   Hyperlipidemia Mother    Osteopenia Mother    Asthma Sister    Hypertension Maternal Grandmother    Diabetes Maternal Grandmother    Heart disease Maternal Grandfather    Cancer Paternal Grandmother        throat, smoker   Cancer Paternal Grandfather        lung, smoker   Diverticulitis Father    Colon cancer Neg Hx    Esophageal cancer Neg Hx    Stomach cancer Neg Hx    Rectal cancer Neg Hx     Social History   Socioeconomic History   Marital status: Legally Separated    Spouse name: Not on file   Number of children: Not on file   Years of education: Not on file   Highest education level: Not on file  Occupational History   Not on file  Tobacco Use   Smoking status: Former    Packs/day: 1.00    Pack years: 0.00    Types: Cigarettes    Quit date: 07/09/2006    Years since quitting: 14.2   Smokeless tobacco: Never  Vaping Use   Vaping Use: Never used  Substance and Sexual Activity   Alcohol use: Not Currently    Alcohol/week: 20.0 standard drinks    Types: 20 Standard drinks or equivalent per week   Drug use: Not Currently    Types: Marijuana    Comment: last 05/2019   Sexual activity: Not Currently    Partners: Male    Birth control/protection: I.U.D., Condom  Other Topics Concern   Not on file  Social History Narrative   Not on file    Social Determinants of Health   Financial Resource Strain: Not on file  Food Insecurity: Not on file  Transportation Needs: Not on file  Physical Activity: Not on file  Stress: Not on file  Social Connections: Not on file  Intimate Partner Violence: Not on file    Review of Systems:    Constitutional: No weight loss, fever or chills Cardiovascular: No chest pain Respiratory: No SOB  Gastrointestinal: See HPI and otherwise negative   Physical Exam:  Vital signs: BP 128/70   Pulse 76   Ht 5\' 4"  (1.626 m)   Wt 266 lb (120.7 kg)   SpO2 96%   BMI 45.66 kg/m    Constitutional:   Pleasant obese Caucasian female appears to be in NAD, Well developed, Well nourished, alert and cooperative Respiratory: Respirations even and unlabored. Lungs clear to auscultation bilaterally.   No wheezes, crackles, or rhonchi.  Cardiovascular: Normal S1, S2. No MRG. Regular rate and rhythm. No peripheral edema, cyanosis or pallor.  Gastrointestinal:  Soft, nondistended, nontender. No rebound or guarding. Normal bowel sounds. No appreciable masses or hepatomegaly. Rectal:  Not performed.  Psychiatric: Demonstrates good judgement and reason without abnormal affect or behaviors.  RELEVANT LABS AND IMAGING: CBC    Component Value Date/Time   WBC 15.9 (H) 07/09/2020 0835   RBC 4.59 07/09/2020 0835   HGB 13.3 07/09/2020 0835   HCT 40.8 07/09/2020 0835   PLT 277 07/09/2020 0835   MCV 88.9 07/09/2020 0835   MCH 29.0 07/09/2020 0835   MCHC 32.6 07/09/2020 0835   RDW 13.7 07/09/2020 0835   LYMPHSABS 1.0 07/09/2020 0835   MONOABS 1.2 (H) 07/09/2020 0835   EOSABS 0.0 07/09/2020 0835   BASOSABS 0.0 07/09/2020 0835    CMP     Component Value Date/Time   NA 141 07/09/2020 0835   K 4.0 07/09/2020 0835   CL 109 07/09/2020 0835   CO2 18 (L) 07/09/2020 0835   GLUCOSE 131 (H) 07/09/2020 0835   BUN 14 07/09/2020 0835   CREATININE 1.37 (H) 07/09/2020 0835   CREATININE 0.66 01/27/2020 1054   CALCIUM  9.0 07/09/2020 0835   PROT 7.9 07/09/2020 0835   ALBUMIN 4.3 07/09/2020 0835   AST 30 07/09/2020 0835   ALT 21 07/09/2020 0835   ALKPHOS 63 07/09/2020 0835   BILITOT 0.8 07/09/2020 0835   GFRNONAA 52 (L) 07/09/2020 0835   GFRNONAA 114 01/27/2020 1054   GFRAA 133 01/27/2020 1054    Assessment: 1.  IBS-D: Manageable for the patient with occasional use of Levsin 2.  GERD: Under good control with Pantoprazole 40 mg daily, last EGD in December 2020 with reflux esophagitis and gastritis  Plan: 1.  Refilled Pantoprazole 40 mg daily, 30 minutes before breakfast.  #90 with 3 refills. 2.  Refilled Hyoscyamine sulfate 0.125 mg sublingual tabs every 4-6 hours #30 with 3 refills. 3.  Discussed with patient that she can schedule out her Hyoscyamine 20 minutes before meals and at bedtime she is having trouble with increased diarrhea or abdominal cramping. 4.  Patient to follow in clinic with [place] in a year or sooner if necessary.  Ellouise Newer, PA-C [facility] 09/21/2020, 10:59 AM  Cc: Unk Pinto, MD

## 2020-09-21 NOTE — Patient Instructions (Signed)
We have sent the following medications to your pharmacy for you to pick up at your convenience: Pantoprazole, Hyoscyamine  If you are age 36 or older, your body mass index should be between 23-30. Your Body mass index is 45.66 kg/m. If this is out of the aforementioned range listed, please consider follow up with your Primary Care Provider.  If you are age 61 or younger, your body mass index should be between 19-25. Your Body mass index is 45.66 kg/m. If this is out of the aformentioned range listed, please consider follow up with your Primary Care Provider.   __________________________________________________________  The [facility] providers would like to encourage you to use [facility] to communicate with providers for non-urgent requests or questions.  Due to long hold times on the telephone, sending your provider a message by [facility] may be a faster and more efficient way to get a response.  Please allow 48 business hours for a response.  Please remember that this is for non-urgent requests.   Follow Up in 1 year, sooner if needed.   Thank you for choosing me and [facility].  Dennison Bulla

## 2020-10-20 ENCOUNTER — Other Ambulatory Visit: Payer: Self-pay | Admitting: *Deleted

## 2020-10-20 MED ORDER — PANTOPRAZOLE SODIUM 40 MG PO TBEC: 40.0000 mg | DELAYED_RELEASE_TABLET | Freq: Every day | ORAL | 3 refills | Status: DC

## 2020-11-17 ENCOUNTER — Other Ambulatory Visit: Payer: Self-pay

## 2020-11-17 ENCOUNTER — Ambulatory Visit (INDEPENDENT_AMBULATORY_CARE_PROVIDER_SITE_OTHER): Payer: 59 | Admitting: Adult Health

## 2020-11-17 ENCOUNTER — Encounter: Payer: Self-pay | Admitting: Adult Health

## 2020-11-17 VITALS — BP 114/80 | HR 77 | Temp 97.5°F | Ht 64.0 in | Wt 262.0 lb

## 2020-11-17 DIAGNOSIS — F909 Attention-deficit hyperactivity disorder, unspecified type: Secondary | ICD-10-CM | POA: Insufficient documentation

## 2020-11-17 DIAGNOSIS — H00022 Hordeolum internum right lower eyelid: Secondary | ICD-10-CM | POA: Diagnosis not present

## 2020-11-17 NOTE — Patient Instructions (Signed)
Stye A stye, also known as a hordeolum, is a bump that forms on an eyelid. It may look like a pimple next to the eyelash. A stye can form inside the eyelid (internal stye) or outside the eyelid (external stye). A stye can cause redness, swelling, and pain on the eyelid. Styes are very common. Anyone can get them at any age. They usually occur injust one eye, but you may have more than one in either eye. What are the causes? A stye is caused by an infection. The infection is almost always caused by bacteria called Staphylococcus aureus.This is a common type of bacteria that lives on the skin. An internal stye may result from an infected oil-producing gland inside the eyelid. An external stye may be caused by an infection at the base of the eyelash (hair follicle). What increases the risk? You are more likely to develop a stye if: You have had a stye before. You have any of these conditions: Diabetes. Red, itchy, inflamed eyelids (blepharitis). A skin condition such as seborrheic dermatitis or rosacea. High fat levels in your blood (lipids). What are the signs or symptoms? The most common symptom of a stye is eyelid pain. Internal styes are more painful than external styes. Other symptoms may include: Painful swelling of your eyelid. A scratchy feeling in your eye. Tearing and redness of your eye. Pus draining from the stye. How is this diagnosed? Your health care provider may be able to diagnose a stye just by examining your eye. The health care provider may also check to make sure: You do not have a fever or other signs of a more serious infection. The infection has not spread to other parts of your eye or areas around your eye. How is this treated? Most styes will clear up in a few days without treatment or with warm compresses applied to the area. You may need to use antibiotic drops orointment to treat an infection. In some cases, if your stye does not heal with routine treatment, your  health care provider may drain pus from the stye using a thin blade or needle. This may be done if the stye is large, causing a lot of pain, or affecting yourvision. Follow these instructions at home: Take over-the-counter and prescription medicines only as told by your health care provider. This includes eye drops or ointments. If you were prescribed an antibiotic medicine, apply or use it as told by your health care provider. Do not stop using the antibiotic even if your condition improves. Apply a warm, wet cloth (warm compress) to your eye for 5-10 minutes, 4 times a day. Clean the affected eyelid as directed by your health care provider. Do not wear contact lenses or eye makeup until your stye has healed. Do not try to pop or drain the stye. Do not rub your eye. Contact a health care provider if: You have chills or a fever. Your stye does not go away after several days. Your stye affects your vision. Your eyeball becomes swollen, red, or painful. Get help right away if: You have pain when moving your eye around. Summary A stye is a bump that forms on an eyelid. It may look like a pimple next to the eyelash. A stye can form inside the eyelid (internal stye) or outside the eyelid (external stye). A stye can cause redness, swelling, and pain on the eyelid. Your health care provider may be able to diagnose a stye just by examining your eye. Apply a warm,  wet cloth (warm compress) to your eye for 5-10 minutes, 4 times a day. This information is not intended to replace advice given to you by your health care provider. Make sure you discuss any questions you have with your healthcare provider. Document Revised: 10/13/2019 Document Reviewed: 12/02/2019 Elsevier Patient Education  [place].

## 2020-11-17 NOTE — Progress Notes (Signed)
Assessment and Plan:  Angela Dixon was seen today for eye problem.  Diagnoses and all orders for this visit:  Hordeolum internum of right lower eyelid Advised warm compresses 3-4 times a day, gently clean eyelids with baby shampoo on qtip or wash cloth and massage lid Avoid wearing eye makeup and switch to new once resolved Call with any changes or if not resolving Ophthalmology for any vision changes  Attention deficit hyperactivity disorder (ADHD), unspecified ADHD type Dr. Johnnye Sima following Adderall helps with focus, no AE's. The patient was counseled on the addictive nature of the medication and was encouraged to take drug holidays when not needed.  If no changes in dose and appropriate use per PDMP we can take over managing this next year.    Further disposition pending results of labs. Discussed med's effects and SE's.   Over 15 minutes of exam, counseling, chart review, and critical decision making was performed.   Future Appointments  Date Time Provider [place]  02/14/2021 10:00 AM Liane Comber, NP [facility] None    ------------------------------------------------------------------------------------------------------------------   HPI BP 114/80   Pulse 77   Temp (!) 97.5 F (36.4 C)   Ht '5\' 4"'$  (1.626 m)   Wt 262 lb (118.8 kg)   SpO2 99%   BMI 44.97 kg/m  35 y.o.female presents for evaluation of R lower eye lid tenderness and swelling x 3 days. She reports mild crusting discharge in AM. Denies eye pain, itching, vision changes. Hasn't tried any interventions. No recent notable events/eye trauma.   She reports was recently evaluated by attention center Dr. Johnnye Sima, dx ADHD and was initiated on adderall XR 20 mg and reports sig benefit with this. She is also on prozac 20 mg via our office and reports overall doing very well.   Past Medical History:  Diagnosis Date   Abnormal Pap smear    Acid reflux    Allergy    Anemia    Anxiety    Cannabinoid  hyperemesis syndrome 02/28/2019   Chlamydia 2006   Depression    Fracture    Hypercholesterolemia    IBS (irritable bowel syndrome)    Vitamin D deficiency      Allergies  Allergen Reactions   Wellbutrin [Bupropion] Hives   Zoloft [Sertraline] Other (See Comments)    Excessive sweating, insomnia   Neomycin Rash    Current Outpatient Medications on File Prior to Visit  Medication Sig   albuterol (PROVENTIL HFA;VENTOLIN HFA) 108 (90 Base) MCG/ACT inhaler Inhale 2 puffs into the lungs every 4 hours as needed for wheezing or shortness of breath. (Patient taking differently: Inhale 2 puffs into the lungs every 4 (four) hours as needed for wheezing or shortness of breath.)   amphetamine-dextroamphetamine (ADDERALL XR) 20 MG 24 hr capsule Take 20 mg by mouth daily.   cetirizine (ZYRTEC) 10 MG tablet Take 10 mg by mouth daily.   Cholecalciferol 125 MCG (5000 UT) capsule Take 2 capsules (10,000 Units total) by mouth daily.   Cyanocobalamin (B-12 PO) Take 1 tablet by mouth daily.   Ferrous Sulfate (IRON PO) Take 1 tablet by mouth daily.   Flaxseed, Linseed, (FLAX SEED OIL) 1000 MG CAPS Take 1,000 mg by mouth daily.    FLUoxetine (PROZAC) 20 MG tablet Take 1 tablet (20 mg total) by mouth daily.   hyoscyamine (ANASPAZ) 0.125 MG TBDP disintergrating tablet Place 1 tablet (0.125 mg total) under the tongue every 6 (six) hours as needed.   Multiple Vitamin (MULTIVITAMIN WITH MINERALS) TABS tablet Take  1 tablet by mouth daily.   pantoprazole (PROTONIX) 40 MG tablet Take 1 tablet (40 mg total) by mouth daily.   promethazine (PHENERGAN) 25 MG suppository Place 1 suppository (25 mg total) rectally every 6 (six) hours as needed for nausea or vomiting.   No current facility-administered medications on file prior to visit.    ROS: all negative except above.   Physical Exam:  BP 114/80   Pulse 77   Temp (!) 97.5 F (36.4 C)   Ht '5\' 4"'$  (1.626 m)   Wt 262 lb (118.8 kg)   SpO2 99%   BMI 44.97  kg/m   General Appearance: Well nourished, in no apparent distress. Eyes: PERRLA, conjunctiva no swelling or erythema. No erythema, discharge or crusting along lid; R lower inner lid with mild tender lump, inner white spot.  Sinuses: No Frontal/maxillary tenderness ENT/Mouth: Ext aud canals clear, TMs without erythema, bulging. No erythema, swelling, or exudate on post pharynx.  Tonsils not swollen or erythematous. Hearing normal.  Neck: Supple Respiratory: Respiratory effort normal Cardio: Appears well perfused.  Lymphatics: Non tender without lymphadenopathy.  Musculoskeletal: normal gait.  Skin: Warm, dry without rashes, ecchymosis Neuro: Normal muscle tone Psych: Awake and oriented X 3, normal affect, Insight and Judgment appropriate.     Angela Ribas, NP 9:33 AM [facility]

## 2020-12-05 ENCOUNTER — Other Ambulatory Visit: Payer: Self-pay | Admitting: Adult Health

## 2021-02-13 ENCOUNTER — Encounter: Payer: Self-pay | Admitting: Internal Medicine

## 2021-02-13 DIAGNOSIS — E782 Mixed hyperlipidemia: Secondary | ICD-10-CM | POA: Insufficient documentation

## 2021-02-13 NOTE — Progress Notes (Signed)
Complete Physical  Assessment and Plan:  Encounter for Annual Physical Exam with abnormal findings Due annually  Health Maintenance reviewed Healthy lifestyle reviewed and goals set  Recurrent major depressive disorder, in partial remission ([place])  Doing well with prozac Continue to reduce ETOH; lifestyle discussed; sleeps fairly; exercise encouraged No SI/HI  Generalized anxiety disorder Continue to reduce alcohol; much improved  Excessive drinking of alcohol Continue to reduce; goal to get off completely with slow tapering   Gastroesophageal reflux disease, unspecified whether esophagitis present Monitor, normal [place], continue PPI and limit ETOH  IBS-D Improved, continue to monitor, add soluble fiber  hyperlipidemia Recommend weight loss, low cholesterol/saturated fat diet, increase fiber and exercise.  LDL goal <100;  Check lipid panel.  -     Lipid panel -     TSH  Vitamin D deficiency -     VITAMIN D 25 Hydroxy (Vit-D Deficiency, Fractures)  Medication management -     CBC with Differential/Platelet -     COMPLETE METABOLIC PANEL WITH GFR -     TSH -     Magnesium  Screening for diabetes mellitus -     Hemoglobin A1c -     Insulin, random  Screening for hematuria or proteinuria -     Urinalysis, Routine w reflex microscopic  Morbid obesity ([place])- BMi 43 Long discussion about weight loss, diet, and exercise Recommended diet heavy in fruits and veggies and low in animal meats, cheeses, and dairy products, appropriate calorie intake Patient will work on moving more, small sustainable changes Discussed appropriate weight for height and initial goal (245lb) Follow up at next visit -     Lipid panel -     TSH -     Hemoglobin A1c -     Insulin, random   Orders Placed This Encounter  Procedures   CBC with Differential/Platelet   COMPLETE METABOLIC PANEL WITH GFR   Magnesium   Lipid panel   TSH   Hemoglobin A1c   VITAMIN D 25 Hydroxy (Vit-D Deficiency,  Fractures)   Urinalysis, Routine w reflex microscopic   Vitamin B12   Iron, TIBC and Ferritin Panel     Discussed med's effects and SE's. Screening labs and tests as requested with regular follow-up as recommended. Over 40 minutes of exam, counseling, chart review, and complex, high level critical decision making was performed this visit.   Future Appointments  Date Time Provider [place]  02/14/2022 10:00 AM Liane Comber, NP [facility] None     HPI  35 y.o. female  presents for a complete physical and follow up for has Vitamin D deficiency; Depression; Generalized anxiety disorder; Medication management; GERD (gastroesophageal reflux disease); Irritable bowel syndrome (IBS); Excessive drinking of alcohol; Stress and adjustment reaction; Morbid obesity with BMI of 40.0-44.9, adult ([place]); Elevated BP without diagnosis of hypertension; ADHD; and Mixed hyperlipidemia on their problem list..  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 She has a 6 y/ and 36 y/o boys. Divorced and happy. Dating some. Request STD check today. Follows with GYN at [facility] annually. Has had abnormal PAPs.  Was having nausea/vomiting, gastritis, GERD; she was drinking excessively, smoking marijuana after separation; US showed normal gallbladder, did show fatty liver. She was referred to GI, had CT that showed rotated IUD, has seen GYN, but ultimately sx improved after quitting marijuana. GERD well controlled on protonix. Also IBS with loose stools, but GI felt due to improvement no further  workup needed at this time. Has reduced from 5 drinks a night to 2-3 nights.   She has hx of depression/anxiety; poorly tolerated wellbutrin, now on prozac 20 mg and doing very well. She has ADHD, on adderall 20 mg XR from Dr. Johnnye Sima.   She continues with protonix daily per GI for long GERD hx.   BMI is Body mass index is 43.47 kg/m., she is working on  diet/exercise, was doing noom and an exercise program. ary drinks, some cravings.  Wt Readings from Last 3 Encounters:  02/14/21 261 lb 3.2 oz (118.5 kg)  11/17/20 262 lb (118.8 kg)  09/21/20 266 lb (120.7 kg)   Today their BP is BP: 110/80 She does not workout. She denies chest pain, shortness of breath, dizziness.   She is not on cholesterol medication and denies myalgias. Her cholesterol is not at goal. The cholesterol last visit was:   Lab Results  Component Value Date   CHOL 244 (H) 01/27/2020   HDL 61 01/27/2020   LDLCALC 137 (H) 01/27/2020   TRIG 319 (H) 01/27/2020   CHOLHDL 4.0 01/27/2020   Last A1C in the office was:  Lab Results  Component Value Date   HGBA1C 5.0 01/27/2020   Patient is on Vitamin D supplement, taking 10000 IU inconsistently.  Lab Results  Component Value Date   VD25OH 20 (L) 01/27/2020     Lab Results  Component Value Date   IRON 94 01/27/2019   TIBC 360 01/27/2019   Lab Results  Component Value Date   ZJIRCVEL38 101 01/27/2019     Current Medications:  Current Outpatient Medications on File Prior to Visit  Medication Sig Dispense Refill   albuterol (PROVENTIL HFA;VENTOLIN HFA) 108 (90 Base) MCG/ACT inhaler Inhale 2 puffs into the lungs every 4 hours as needed for wheezing or shortness of breath. (Patient taking differently: Inhale 2 puffs into the lungs every 4 (four) hours as needed for wheezing or shortness of breath.) 3 Inhaler 0   amphetamine-dextroamphetamine (ADDERALL XR) 20 MG 24 hr capsule Take 20 mg by mouth daily.     cetirizine (ZYRTEC) 10 MG tablet Take 10 mg by mouth daily.     Cholecalciferol 125 MCG (5000 UT) capsule Take 2 capsules (10,000 Units total) by mouth daily.     Cyanocobalamin (B-12 PO) Take 1 tablet by mouth daily.     Ferrous Sulfate (IRON PO) Take 1 tablet by mouth daily.     Flaxseed, Linseed, (FLAX SEED OIL) 1000 MG CAPS Take 1,000 mg by mouth daily.      FLUoxetine (PROZAC) 20 MG capsule TAKE 1 CAPSULE DAILY  90 capsule 3   hyoscyamine (ANASPAZ) 0.125 MG TBDP disintergrating tablet Place 1 tablet (0.125 mg total) under the tongue every 6 (six) hours as needed. 30 tablet 2   Multiple Vitamin (MULTIVITAMIN WITH MINERALS) TABS tablet Take 1 tablet by mouth daily.     pantoprazole (PROTONIX) 40 MG tablet Take 1 tablet (40 mg total) by mouth daily. 90 tablet 3   No current facility-administered medications on file prior to visit.   Allergies:  Allergies  Allergen Reactions   Wellbutrin [Bupropion] Hives   Zoloft [Sertraline] Other (See Comments)    Excessive sweating, insomnia   Neomycin Rash   Medical History:  She has Vitamin D deficiency; Depression; Generalized anxiety disorder; Medication management; GERD (gastroesophageal reflux disease); Irritable bowel syndrome (IBS); Excessive drinking of alcohol; Stress and adjustment reaction; Morbid obesity with BMI of 40.0-44.9, adult ([place]); Elevated BP without  diagnosis of hypertension; ADHD; and Mixed hyperlipidemia on their problem list.   Health Maintenance:   Immunization History  Administered Date(s) Administered   Influenza Inj Mdck Quad With Preservative 01/09/2017, 01/27/2020   Influenza,inj,Quad PF,6+ Mos 11/28/2018, 11/28/2018   PFIZER(Purple Top)SARS-COV-2 Vaccination 06/29/2019, 07/20/2019, 02/10/2020   PPD Test 12/09/2013   Tdap 11/19/2010, 02/15/2013, 09/15/2014   Tetanus: 2016 Pneumovax: Prevnar 13:  Flu vaccine: 2022 Covid 19: 2/2, pfizer, + boosters  Pap: Getting annually at GYN due to abnormal  MGM: N/A AB [place] 10/2018 normal gallbladder, fatty liver DEXA:n/v  Colonoscopy: age 28 EGD: 03/2019, gastritis, hiatal hernia   Last eye: [place] [facility] annually, glasses Last dental: 2022, goes q58m  [place] [facility] - Dr. Johnnye Sima   Patient Care Team: Unk Pinto, MD as PCP - General (Internal Medicine) Dyke Maes, [place]yer (Optometry) Murrell Redden Earlyne Iba, MD as Consulting Physician (Obstetrics and  Gynecology)  Surgical History:  She has a past surgical history that includes Wisdom tooth extraction and Colposcopy (04/08/2004). Family History:  Herfamily history includes Asthma in her sister; Cancer in her paternal grandfather and paternal grandmother; Diabetes in her maternal grandmother; Diverticulitis in her father; Heart disease in her maternal grandfather; Hyperlipidemia in her mother; Hypertension in her maternal grandmother; Osteopenia in her mother. Social History:  She reports that she quit smoking about 14 years ago. Her smoking use included cigarettes. She smoked an average of 1 pack per day. She has never used smokeless tobacco. She reports current alcohol use of about 2.0 - 3.0 standard drinks per week. She reports that she does not currently use drugs after having used the following drugs: Marijuana.  Review of Systems: Review of Systems  Constitutional: Negative.  Negative for malaise/fatigue and weight loss.  HENT: Negative.  Negative for hearing loss and tinnitus.   Eyes: Negative.  Negative for blurred vision and double vision.  Respiratory: Negative.  Negative for cough, shortness of breath and wheezing.   Cardiovascular: Negative.  Negative for chest pain, palpitations, orthopnea, claudication and leg swelling.  Gastrointestinal:  Positive for heartburn (improved, rare). Negative for abdominal pain, blood in stool, constipation, diarrhea, melena, nausea and vomiting.  Genitourinary: Negative.   Musculoskeletal: Negative.  Negative for joint pain and myalgias.  Skin: Negative.  Negative for rash.  Neurological:  Negative for dizziness, tingling, sensory change, weakness and headaches.  Endo/Heme/Allergies:  Negative for polydipsia.  Psychiatric/Behavioral:  Positive for substance abuse (alcohol, 2-3 per night, has reduced). Negative for depression, hallucinations, memory loss and suicidal ideas. The patient is not nervous/anxious and does not have insomnia.   All other  systems reviewed and are negative.  Physical Exam: Estimated body mass index is 43.47 kg/m as calculated from the following:   Height as of this encounter: 5\' 5"  (1.651 m).   Weight as of this encounter: 261 lb 3.2 oz (118.5 kg). BP 110/80   Pulse 90   Temp (!) 97.5 F (36.4 C)   Ht 5\' 5"  (1.651 m)   Wt 261 lb 3.2 oz (118.5 kg)   SpO2 99%   BMI 43.47 kg/m  General Appearance: Well nourished, well dressed, morbidly obese female in no apparent distress.  Eyes: PERRLA, EOMs, conjunctiva no swelling or erythema Sinuses: No Frontal/maxillary tenderness  ENT/Mouth: Ext aud canals clear, normal light reflex with TMs without erythema, bulging. Good dentition. No erythema, swelling, or exudate on post pharynx. Tonsils not swollen or erythematous. Hearing normal.  Neck: Supple, thyroid normal. No bruits  Respiratory: Respiratory effort normal, BS equal  bilaterally without rales, rhonchi, wheezing or stridor.  Cardio: RRR without murmurs, rubs or gallops. Brisk peripheral pulses without edema.  Chest: symmetric, with normal excursions and percussion.  Breasts: Defer to GYN Abdomen: Soft, obese abdomen, nontender, no guarding, rebound, hernias, masses, or organomegaly.  Lymphatics: Non tender without lymphadenopathy.  Genitourinary: Defer to GYN Musculoskeletal: Full ROM all peripheral extremities,5/5 strength, and normal gait.  Skin: Warm, dry without rashes, lesions, ecchymosis. Neuro: Cranial nerves intact, reflexes equal bilaterally. Normal muscle tone, no cerebellar symptoms. Sensation intact.  Psych: Awake and oriented X 3, normal affect, Insight and Judgment appropriate.   EKG: NSR, No ST changes at baseline reviewed, defer  Angela Dixon 10:29 AM [facility]

## 2021-02-14 ENCOUNTER — Ambulatory Visit (INDEPENDENT_AMBULATORY_CARE_PROVIDER_SITE_OTHER): Payer: 59 | Admitting: Adult Health

## 2021-02-14 ENCOUNTER — Encounter: Payer: Self-pay | Admitting: Adult Health

## 2021-02-14 ENCOUNTER — Other Ambulatory Visit: Payer: Self-pay

## 2021-02-14 VITALS — BP 110/80 | HR 90 | Temp 97.5°F | Ht 65.0 in | Wt 261.2 lb

## 2021-02-14 DIAGNOSIS — Z0000 Encounter for general adult medical examination without abnormal findings: Secondary | ICD-10-CM | POA: Diagnosis not present

## 2021-02-14 DIAGNOSIS — Z1329 Encounter for screening for other suspected endocrine disorder: Secondary | ICD-10-CM

## 2021-02-14 DIAGNOSIS — K580 Irritable bowel syndrome with diarrhea: Secondary | ICD-10-CM

## 2021-02-14 DIAGNOSIS — Z6841 Body Mass Index (BMI) 40.0 and over, adult: Secondary | ICD-10-CM

## 2021-02-14 DIAGNOSIS — F331 Major depressive disorder, recurrent, moderate: Secondary | ICD-10-CM

## 2021-02-14 DIAGNOSIS — K219 Gastro-esophageal reflux disease without esophagitis: Secondary | ICD-10-CM

## 2021-02-14 DIAGNOSIS — F909 Attention-deficit hyperactivity disorder, unspecified type: Secondary | ICD-10-CM

## 2021-02-14 DIAGNOSIS — Z131 Encounter for screening for diabetes mellitus: Secondary | ICD-10-CM

## 2021-02-14 DIAGNOSIS — E782 Mixed hyperlipidemia: Secondary | ICD-10-CM

## 2021-02-14 DIAGNOSIS — R030 Elevated blood-pressure reading, without diagnosis of hypertension: Secondary | ICD-10-CM

## 2021-02-14 DIAGNOSIS — Z0001 Encounter for general adult medical examination with abnormal findings: Secondary | ICD-10-CM

## 2021-02-14 DIAGNOSIS — Z79899 Other long term (current) drug therapy: Secondary | ICD-10-CM

## 2021-02-14 DIAGNOSIS — F411 Generalized anxiety disorder: Secondary | ICD-10-CM

## 2021-02-14 DIAGNOSIS — Z1389 Encounter for screening for other disorder: Secondary | ICD-10-CM

## 2021-02-14 DIAGNOSIS — E559 Vitamin D deficiency, unspecified: Secondary | ICD-10-CM

## 2021-02-14 DIAGNOSIS — F4329 Adjustment disorder with other symptoms: Secondary | ICD-10-CM

## 2021-02-14 DIAGNOSIS — D649 Anemia, unspecified: Secondary | ICD-10-CM

## 2021-02-14 NOTE — Patient Instructions (Addendum)
  Angela Dixon , Thank you for taking time to come for your Annual Wellness Visit. I appreciate your ongoing commitment to your health goals. Please review the following plan we discussed and let me know if I can assist you in the future.   These are the goals we discussed:  Goals      DIET - EAT MORE FRUITS AND VEGETABLES     DIET - INCREASE WATER INTAKE     Exercise 3x per week (30 min per time)     Weight (lb) < 245 lb (111.1 kg)        This is a list of the screening recommended for you and due dates:  Health Maintenance  Topic Date Due   Pap Smear  12/31/2020   Pneumococcal Vaccination (1 - PCV) 02/14/2041*   COVID-19 Vaccine (5 - Booster for Pfizer series) 03/24/2021   Tetanus Vaccine  09/14/2024   Flu Shot  Completed   HIV Screening  Completed   HPV Vaccine  Aged Out   Hepatitis C Screening: USPSTF Recommendation to screen - Ages 18-79 yo.  Discontinued  *Topic was postponed. The date shown is not the original due date.       Try Dr. Karlton Lemon daily dozen app  Avoid liquid calories white flour products  Limit saturated fat for cholesterol  Dave's killer bread, farro, quinoa, whole barley, brown rice High fiber wraps or pita pockets (usually say keto friendly) are ok to have in moderation    Know what a healthy weight is for you (roughly BMI <25) and aim to maintain this  Aim for 7+ servings of fruits and vegetables daily  65-80+ fluid ounces of water or unsweet tea for healthy kidneys  Limit to max 1 drink of alcohol per day; avoid smoking/tobacco  Limit animal fats in diet for cholesterol and heart health - choose grass fed whenever available  Avoid highly processed foods, and foods high in saturated/trans fats  Aim for low stress - take time to unwind and care for your mental health  Aim for 150 min of moderate intensity exercise weekly for heart health, and weights twice weekly for bone health  Aim for 7-9 hours of sleep daily     A great goal to work  towards is aiming to get in a serving daily of some of the most nutritionally dense foods - G- BOMBS daily

## 2021-02-15 ENCOUNTER — Encounter: Payer: Self-pay | Admitting: Adult Health

## 2021-02-15 DIAGNOSIS — E538 Deficiency of other specified B group vitamins: Secondary | ICD-10-CM | POA: Insufficient documentation

## 2021-02-15 LAB — LIPID PANEL
Cholesterol: 248 mg/dL — ABNORMAL HIGH (ref <200)
HDL: 74 mg/dL (ref >=50)
LDL Cholesterol (Calc): 133 mg/dL (calc) — ABNORMAL HIGH
Non-HDL Cholesterol (Calc): 174 mg/dL (calc) — ABNORMAL HIGH (ref <130)
Total CHOL/HDL Ratio: 3.4 (calc) (ref <5.0)
Triglycerides: 268 mg/dL — ABNORMAL HIGH (ref <150)

## 2021-02-15 LAB — CBC WITH DIFFERENTIAL/PLATELET
Absolute Monocytes: 478 cells/uL (ref 200–950)
Basophils Absolute: 16 cells/uL (ref 0–200)
Basophils Relative: 0.2 %
Eosinophils Absolute: 162 cells/uL (ref 15–500)
Eosinophils Relative: 2 %
HCT: 40.2 % (ref 35.0–45.0)
Hemoglobin: 13.4 g/dL (ref 11.7–15.5)
Lymphs Abs: 1717 cells/uL (ref 850–3900)
MCH: 29.4 pg (ref 27.0–33.0)
MCHC: 33.3 g/dL (ref 32.0–36.0)
MCV: 88.2 fL (ref 80.0–100.0)
MPV: 9.7 fL (ref 7.5–12.5)
Monocytes Relative: 5.9 %
Neutro Abs: 5727 cells/uL (ref 1500–7800)
Neutrophils Relative %: 70.7 %
Platelets: 277 10*3/uL (ref 140–400)
RBC: 4.56 10*6/uL (ref 3.80–5.10)
RDW: 13.1 % (ref 11.0–15.0)
Total Lymphocyte: 21.2 %
WBC: 8.1 10*3/uL (ref 3.8–10.8)

## 2021-02-15 LAB — COMPLETE METABOLIC PANEL WITH GFR
AG Ratio: 1.6 (calc) (ref 1.0–2.5)
ALT: 16 U/L (ref 6–29)
AST: 21 U/L (ref 10–30)
Albumin: 4.5 g/dL (ref 3.6–5.1)
Alkaline phosphatase (APISO): 81 U/L (ref 31–125)
BUN: 7 mg/dL (ref 7–25)
CO2: 24 mmol/L (ref 20–32)
Calcium: 9.6 mg/dL (ref 8.6–10.2)
Chloride: 102 mmol/L (ref 98–110)
Creat: 0.58 mg/dL (ref 0.50–0.97)
Globulin: 2.8 g/dL (calc) (ref 1.9–3.7)
Glucose, Bld: 93 mg/dL (ref 65–99)
Potassium: 4.5 mmol/L (ref 3.5–5.3)
Sodium: 139 mmol/L (ref 135–146)
Total Bilirubin: 0.5 mg/dL (ref 0.2–1.2)
Total Protein: 7.3 g/dL (ref 6.1–8.1)
eGFR: 120 mL/min/{1.73_m2} (ref >=60)

## 2021-02-15 LAB — URINALYSIS, ROUTINE W REFLEX MICROSCOPIC
Bacteria, UA: NONE SEEN /HPF
Bilirubin Urine: NEGATIVE
Glucose, UA: NEGATIVE
Hgb urine dipstick: NEGATIVE
Hyaline Cast: NONE SEEN /LPF
Ketones, ur: NEGATIVE
Nitrite: NEGATIVE
Protein, ur: NEGATIVE
RBC / HPF: NONE SEEN /HPF (ref 0–2)
Specific Gravity, Urine: 1.012 (ref 1.001–1.035)
pH: 6 (ref 5.0–8.0)

## 2021-02-15 LAB — IRON,TIBC AND FERRITIN PANEL
%SAT: 30 % (calc) (ref 16–45)
Ferritin: 92 ng/mL (ref 16–154)
Iron: 113 ug/dL (ref 40–190)
TIBC: 379 mcg/dL (calc) (ref 250–450)

## 2021-02-15 LAB — TSH: TSH: 2.94 mIU/L

## 2021-02-15 LAB — HEMOGLOBIN A1C
Hgb A1c MFr Bld: 4.9 % of total Hgb (ref <5.7)
Mean Plasma Glucose: 94 mg/dL
eAG (mmol/L): 5.2 mmol/L

## 2021-02-15 LAB — MICROSCOPIC MESSAGE

## 2021-02-15 LAB — VITAMIN D 25 HYDROXY (VIT D DEFICIENCY, FRACTURES): Vit D, 25-Hydroxy: 23 ng/mL — ABNORMAL LOW (ref 30–100)

## 2021-02-15 LAB — VITAMIN B12: Vitamin B-12: 281 pg/mL (ref 200–1100)

## 2021-02-15 LAB — MAGNESIUM: Magnesium: 2 mg/dL (ref 1.5–2.5)

## 2021-02-22 ENCOUNTER — Encounter: Payer: Self-pay | Admitting: Adult Health

## 2021-03-05 ENCOUNTER — Other Ambulatory Visit: Payer: Self-pay

## 2021-03-05 MED ORDER — FLUOXETINE HCL 20 MG PO CAPS
20.0000 mg | ORAL_CAPSULE | Freq: Every day | ORAL | 3 refills | Status: DC
Start: 2021-03-05 — End: 2021-11-29

## 2021-03-07 ENCOUNTER — Encounter: Payer: Self-pay | Admitting: Adult Health

## 2021-03-07 DIAGNOSIS — R87610 Atypical squamous cells of undetermined significance on cytologic smear of cervix (ASC-US): Secondary | ICD-10-CM | POA: Insufficient documentation

## 2021-03-21 ENCOUNTER — Encounter: Payer: Self-pay | Admitting: Internal Medicine

## 2021-04-19 ENCOUNTER — Encounter: Payer: Self-pay | Admitting: Adult Health

## 2021-08-06 ENCOUNTER — Telehealth: Payer: Self-pay | Admitting: Physician Assistant

## 2021-08-06 NOTE — Telephone Encounter (Signed)
Inbound call from patient requesting medication refill for pantoprazole sent through [facility]  ?

## 2021-08-07 MED ORDER — PANTOPRAZOLE SODIUM 40 MG PO TBEC: 40.0000 mg | DELAYED_RELEASE_TABLET | Freq: Every day | ORAL | 3 refills | Status: DC

## 2021-08-07 NOTE — Telephone Encounter (Signed)
Refill sent.

## 2021-08-17 NOTE — Progress Notes (Deleted)
6 MONTH FOLLOW UP  Assessment and Plan:  *** Recurrent major depressive disorder, in partial remission ([place])  Doing well with prozac Continue to reduce ETOH; lifestyle discussed; sleeps fairly; exercise encouraged No SI/HI  Generalized anxiety disorder Continue to reduce alcohol; much improved  Excessive drinking of alcohol Continue to reduce; goal to get off completely with slow tapering   hyperlipidemia Recommend weight loss, low cholesterol/saturated fat diet, increase fiber and exercise.  LDL goal <100;  Check lipid panel.  -     Lipid panel  Vitamin D deficiency -     VITAMIN D 25 Hydroxy (Vit-D Deficiency, Fractures)  Morbid obesity ([facility])- BMI 43 Long discussion about weight loss, diet, and exercise Recommended diet heavy in fruits and veggies and low in animal meats, cheeses, and dairy products, appropriate calorie intake Patient will work on moving more, small sustainable changes Discussed appropriate weight for height and initial goal (245lb) Follow up at next visit  B12 deficiency *** -  B12   No orders of the defined types were placed in this encounter.    Discussed med's effects and SE's. Labs and tests as requested with regular follow-up as recommended. Over 30 minutes of exam, counseling, chart review, and complex, high level critical decision making was performed this visit.   Future Appointments  Date Time Provider [place]  08/21/2021  9:30 AM Liane Comber, NP [facility] None  02/14/2022 10:00 AM Liane Comber, NP [facility] None     HPI  36 y.o. female  presents for 6 month follow up on mood, hld, obesity, vit D def.   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 She has a 6 y/ and 37 y/o boys. Divorced and happy. Dating some. Request STD check today. Follows with GYN at [facility] annually. Has had abnormal PAPs.  Was having nausea/vomiting, gastritis, GERD; she was drinking  excessively, smoking marijuana after separation; US showed normal gallbladder, did show fatty liver. She was referred to GI, had CT that showed rotated IUD, has seen GYN, but ultimately sx improved after quitting marijuana. GERD well controlled on protonix. Also IBS with loose stools, but GI felt due to improvement no further workup needed at this time. Has reduced from 5 drinks a night to 2-3 nights.   She has hx of depression/anxiety; poorly tolerated wellbutrin, now on prozac 20 mg and doing very well. She has ADHD, on adderall 20 mg XR from Dr. Johnnye Sima.   She continues with protonix daily per GI for long GERD hx.   BMI is There is no height or weight on file to calculate BMI., she is working on diet/exercise, was doing noom and an exercise program. ary drinks, some cravings.  Wt Readings from Last 3 Encounters:  02/14/21 261 lb 3.2 oz (118.5 kg)  11/17/20 262 lb (118.8 kg)  09/21/20 266 lb (120.7 kg)   Today their BP is   She does not workout. She denies chest pain, shortness of breath, dizziness.   She is not on cholesterol medication and denies myalgias. Her cholesterol is not at goal. The cholesterol last visit was:   Lab Results  Component Value Date   CHOL 248 (H) 02/14/2021   HDL 74 02/14/2021   LDLCALC 133 (H) 02/14/2021   TRIG 268 (H) 02/14/2021   CHOLHDL 3.4 02/14/2021   Last A1C in the office was:  Lab Results  Component Value Date   HGBA1C 4.9 02/14/2021   Patient is on Vitamin  D supplement, taking 10000 IU inconsistently.  Lab Results  Component Value Date   VD25OH 23 (L) 02/14/2021     Lab Results  Component Value Date   VITAMINB12 281 02/14/2021     Current Medications:  Current Outpatient Medications on File Prior to Visit  Medication Sig Dispense Refill   albuterol (PROVENTIL HFA;VENTOLIN HFA) 108 (90 Base) MCG/ACT inhaler Inhale 2 puffs into the lungs every 4 hours as needed for wheezing or shortness of breath. (Patient taking differently: Inhale 2  puffs into the lungs every 4 (four) hours as needed for wheezing or shortness of breath.) 3 Inhaler 0   amphetamine-dextroamphetamine (ADDERALL XR) 20 MG 24 hr capsule Take 20 mg by mouth daily.     cetirizine (ZYRTEC) 10 MG tablet Take 10 mg by mouth daily.     Cholecalciferol 125 MCG (5000 UT) capsule Take 2 capsules (10,000 Units total) by mouth daily.     Cyanocobalamin (B-12 PO) Take 1 tablet by mouth daily.     Ferrous Sulfate (IRON PO) Take 1 tablet by mouth daily.     Flaxseed, Linseed, (FLAX SEED OIL) 1000 MG CAPS Take 1,000 mg by mouth daily.      FLUoxetine (PROZAC) 20 MG capsule Take 1 capsule (20 mg total) by mouth daily. 90 capsule 3   hyoscyamine (ANASPAZ) 0.125 MG TBDP disintergrating tablet Place 1 tablet (0.125 mg total) under the tongue every 6 (six) hours as needed. 30 tablet 2   magnesium gluconate (MAGONATE) 500 MG tablet Take 500 mg by mouth daily.     Multiple Vitamin (MULTIVITAMIN WITH MINERALS) TABS tablet Take 1 tablet by mouth daily.     pantoprazole (PROTONIX) 40 MG tablet Take 1 tablet (40 mg total) by mouth daily. 90 tablet 3   No current facility-administered medications on file prior to visit.   Allergies:  Allergies  Allergen Reactions   Wellbutrin [Bupropion] Hives   Zoloft [Sertraline] Other (See Comments)    Excessive sweating, insomnia   Neomycin Rash   Medical History:  She has Vitamin D deficiency; Depression; Generalized anxiety disorder; Medication management; GERD (gastroesophageal reflux disease); Irritable bowel syndrome (IBS); Excessive drinking of alcohol; Stress and adjustment reaction; Morbid obesity with BMI of 40.0-44.9, adult ([place]); Elevated BP without diagnosis of hypertension; ADHD; Mixed hyperlipidemia; B12 deficiency; and ASCUS of cervix with negative high risk HPV on their problem list.   Surgical History:  She has a past surgical history that includes Wisdom tooth extraction and Colposcopy (04/08/2004). Family History:  Herfamily  history includes Asthma in her sister; Cancer in her paternal grandfather and paternal grandmother; Diabetes in her maternal grandmother; Diverticulitis in her father; Heart disease in her maternal grandfather; Hyperlipidemia in her mother; Hypertension in her maternal grandmother; Osteopenia in her mother. Social History:  She reports that she quit smoking about 15 years ago. Her smoking use included cigarettes. She smoked an average of 1 pack per day. She has never used smokeless tobacco. She reports current alcohol use of about 2.0 - 3.0 standard drinks per week. She reports that she does not currently use drugs after having used the following drugs: Marijuana.  *** Review of Systems: Review of Systems  Constitutional: Negative.  Negative for malaise/fatigue and weight loss.  HENT: Negative.  Negative for hearing loss and tinnitus.   Eyes: Negative.  Negative for blurred vision and double vision.  Respiratory: Negative.  Negative for cough, shortness of breath and wheezing.   Cardiovascular: Negative.  Negative for chest pain, palpitations, orthopnea,  claudication and leg swelling.  Gastrointestinal:  Positive for heartburn (improved, rare). Negative for abdominal pain, blood in stool, constipation, diarrhea, melena, nausea and vomiting.  Genitourinary: Negative.   Musculoskeletal: Negative.  Negative for joint pain and myalgias.  Skin: Negative.  Negative for rash.  Neurological:  Negative for dizziness, tingling, sensory change, weakness and headaches.  Endo/Heme/Allergies:  Negative for polydipsia.  Psychiatric/Behavioral:  Positive for substance abuse (alcohol, 2-3 per night, has reduced). Negative for depression, hallucinations, memory loss and suicidal ideas. The patient is not nervous/anxious and does not have insomnia.   All other systems reviewed and are negative.  Physical Exam: Estimated body mass index is 43.47 kg/m as calculated from the following:   Height as of 02/14/21: 5'  5" (1.651 m).   Weight as of 02/14/21: 261 lb 3.2 oz (118.5 kg). There were no vitals taken for this visit. General Appearance: Well nourished, well dressed, morbidly obese female in no apparent distress.  Eyes: PERRLA, EOMs, conjunctiva no swelling or erythema Sinuses: No Frontal/maxillary tenderness  ENT/Mouth: Ext aud canals clear, normal light reflex with TMs without erythema, bulging. Good dentition. No erythema, swelling, or exudate on post pharynx. Tonsils not swollen or erythematous. Hearing normal.  Neck: Supple, thyroid normal. No bruits  Respiratory: Respiratory effort normal, BS equal bilaterally without rales, rhonchi, wheezing or stridor.  Cardio: RRR without murmurs, rubs or gallops. Brisk peripheral pulses without edema.  Abdomen: Soft, obese abdomen, nontender, no guarding, rebound, hernias, masses, or organomegaly.  Lymphatics: Non tender without lymphadenopathy.  Musculoskeletal: Full ROM all peripheral extremities,5/5 strength, and normal gait.  Skin: Warm, dry without rashes, lesions, ecchymosis. Neuro: Cranial nerves intact, reflexes equal bilaterally. Normal muscle tone, no cerebellar symptoms. Sensation intact.  Psych: Awake and oriented X 3, normal affect, Insight and Judgment appropriate.    [name], DNP, AGNP-C 1:14 PM [facility]

## 2021-08-21 ENCOUNTER — Ambulatory Visit: Payer: 59 | Admitting: Adult Health

## 2021-08-21 DIAGNOSIS — E559 Vitamin D deficiency, unspecified: Secondary | ICD-10-CM

## 2021-08-21 DIAGNOSIS — F1010 Alcohol abuse, uncomplicated: Secondary | ICD-10-CM

## 2021-08-21 DIAGNOSIS — E782 Mixed hyperlipidemia: Secondary | ICD-10-CM

## 2021-08-21 DIAGNOSIS — F4329 Adjustment disorder with other symptoms: Secondary | ICD-10-CM

## 2021-08-21 DIAGNOSIS — E538 Deficiency of other specified B group vitamins: Secondary | ICD-10-CM

## 2021-08-21 DIAGNOSIS — Z79899 Other long term (current) drug therapy: Secondary | ICD-10-CM

## 2021-08-21 DIAGNOSIS — F331 Major depressive disorder, recurrent, moderate: Secondary | ICD-10-CM

## 2021-08-21 DIAGNOSIS — F909 Attention-deficit hyperactivity disorder, unspecified type: Secondary | ICD-10-CM

## 2021-08-28 ENCOUNTER — Telehealth: Payer: 59 | Admitting: Nurse Practitioner

## 2021-08-28 DIAGNOSIS — L03119 Cellulitis of unspecified part of limb: Secondary | ICD-10-CM | POA: Diagnosis not present

## 2021-08-28 MED ORDER — AMOXICILLIN-POT CLAVULANATE 875-125 MG PO TABS: 1.0000 | ORAL_TABLET | Freq: Two times a day (BID) | ORAL | 0 refills | Status: AC

## 2021-08-28 NOTE — Progress Notes (Signed)
E Visit for Cellulitis  We are sorry that you are not feeling well. Here is how we plan to help!  Based on what you shared with me it looks like you have cellulitis.  Cellulitis looks like areas of skin redness, swelling, and warmth; it develops as a result of bacteria entering under the skin. Little red spots and/or bleeding can be seen in skin, and tiny surface sacs containing fluid can occur. Fever can be present. Cellulitis is almost always on one side of a body, and the lower limbs are the most common site of involvement.   I have prescribed: Augmentin twice daily for ten days. You can continue to use the topical antibiotic ointment as well.  Perform warm soaks with Epson salt as well to help remove any drainage.   Meds ordered this encounter  Medications   amoxicillin-clavulanate (AUGMENTIN) 875-125 MG tablet    Sig: Take 1 tablet by mouth 2 (two) times daily for 10 days.    Dispense:  20 tablet    Refill:  0     HOME CARE:  Take your medications as ordered and take all of them, even if the skin irritation appears to be healing.   GET HELP RIGHT AWAY IF:  Symptoms that don't begin to go away within 48 hours. Severe redness persists or worsens If the area turns color, spreads or swells. If it blisters and opens, develops yellow-brown crust or bleeds. You develop a fever or chills. If the pain increases or becomes unbearable.  Are unable to keep fluids and food down.  MAKE SURE YOU   Understand these instructions. Will watch your condition. Will get help right away if you are not doing well or get worse.  Thank you for choosing an e-visit.  Your e-visit answers were reviewed by a board certified advanced clinical practitioner to complete your personal care plan. Depending upon the condition, your plan could have included both over the counter or prescription medications.  Please review your pharmacy choice. Make sure the pharmacy is open so you can pick up prescription  now. If there is a problem, you may contact your provider through [employer] and have the prescription routed to another pharmacy.  Your safety is important to [place]. If you have drug allergies check your prescription carefully.   For the next 24 hours you can use MyChart to ask questions about today's visit, request a non-urgent call back, or ask for a work or school excuse. You will get an email in the next two days asking about your experience. I hope that your e-visit has been valuable and will speed your recovery.   I spent approximately 7 minutes reviewing the patient's history, current symptoms and coordinating their plan of care today.

## 2021-09-28 NOTE — Progress Notes (Deleted)
Assessment and Plan:   Recurrent major depressive disorder, in partial remission (HCC)/ Generalized anxiety disorder Much improved with prozac 20 mg daily; continue current regimen *** She has access to counselor with perceived benefit Stress management techniques discussed, increase water, good sleep hygiene discussed, increase exercise, and increase veggies.   Excessive drinking of alcohol Has decreased, continue to evaluate at each visit Try to limit to 1/day, <4/week ***  Gastroesophageal reflux disease, unspecified whether esophagitis present she has stopped marijuana and reduced ETOH with improved sx GI has follow up scheduled next week Continue PPI for now; discussed possible taper plan to [facility] if continues to do well but will defer decision to GI Discussed diet, avoiding triggers and other lifestyle changes  Mixed hyperlipidemia She has been unable to work on lifestyle; motivated to start *** Would not start medication at this time Defer checking today;  Discussed low saturated fat diet, increase fiber, weight loss Plan to check later this year at [facility]  Vitamin D deficiency Restart regular supplement for goal range 60-10 Discussed with patient, defer check today  Morbid Obesity ([place]) - BMI 44 She is making slow sustainable changes with weight slowly trending down Long discussion about weight loss, diet, and exercise Recommended minimally processed plant heavy diet She has perceived benefit with noom app, plans to restart She also plans to increase walking and start video exercise program Encouraged small sustainable changes, building lifestyle habits *** Discussed initial goal (<250 lb) Follow up at next visit   Future Appointments  Date Time Provider [place]  10/02/2021  9:00 AM Liane Comber, NP [facility] None  02/14/2022 10:00 AM Liane Comber, NP [facility] None     HPI 36 y.o.female with history of depression, anxiety, GERD, alcohol use,  hyperlipidemia presents for  6 month follow up.   She is with new accounting job, just getting through busy season.   She was started on effexor for depression in 2020 while in school, had poor motivation and started wellbutrin but developed hives, hx of celexa and zoloft without benefit/poor tolerance, now taking prozac 20 mg daily and reports doing well. Denies SI/HI. Sleeps fairly with benadryl and melanonin when needed. She did work with counselor but stopped due to busy season at work.   Hx of N/V due to excessive alcohol use and marijuana use,  followed with GI and had EGD showing gastritis/esophagitis, neg H pylori. She has stopped smoking and reports has not had any nausea/vomiting symptoms. She is drinking ETOH 3-4 days a week, 2-3 glasses of wine. She has follow up with GI planned next week. She continues with pantoprazole 40 mg daily and denies GERD sx.   BMI is There is no height or weight on file to calculate BMI., she is working on diet and, was taking a break but plans to get restarted with noom which was helpful in the past. Trying to eat out less. Trying to work up on walking and plans to start doing a video exercise program.  Wt Readings from Last 3 Encounters:  02/14/21 261 lb 3.2 oz (118.5 kg)  11/17/20 262 lb (118.8 kg)  09/21/20 266 lb (120.7 kg)   Her blood pressure has been controlled at home, today their BP is    She does not workout. She denies chest pain, shortness of breath, dizziness.   She is not on cholesterol medication and denies myalgias. Mom with high cholesterol, MGF and MGM with heart disease.   Her cholesterol is not at goal. The  cholesterol last visit was:   Lab Results  Component Value Date   CHOL 248 (H) 02/14/2021   HDL 74 02/14/2021   LDLCALC 133 (H) 02/14/2021   TRIG 268 (H) 02/14/2021   CHOLHDL 3.4 02/14/2021   Last A1C in the office was:  Lab Results  Component Value Date   HGBA1C 4.9 02/14/2021   Patient is on Vitamin D supplement, she  finished her RX and is now on 5000 IU daily but admits hasn't been taking regularly.  Lab Results  Component Value Date   VD25OH 23 (L) 02/14/2021        Past Medical History:  Diagnosis Date   Abnormal Pap smear    Acid reflux    Allergy    Anemia    Anxiety    Cannabinoid hyperemesis syndrome 02/28/2019   Chlamydia 2006   Depression    Fracture    Hypercholesterolemia    IBS (irritable bowel syndrome)    Vitamin D deficiency      Allergies  Allergen Reactions   Wellbutrin [Bupropion] Hives   Zoloft [Sertraline] Other (See Comments)    Excessive sweating, insomnia   Neomycin Rash      Current Outpatient Medications (Respiratory):    albuterol (PROVENTIL HFA;VENTOLIN HFA) 108 (90 Base) MCG/ACT inhaler, Inhale 2 puffs into the lungs every 4 hours as needed for wheezing or shortness of breath. (Patient taking differently: Inhale 2 puffs into the lungs every 4 (four) hours as needed for wheezing or shortness of breath.)   cetirizine (ZYRTEC) 10 MG tablet, Take 10 mg by mouth daily.   Current Outpatient Medications (Hematological):    Cyanocobalamin (B-12 PO), Take 1 tablet by mouth daily.   Ferrous Sulfate (IRON PO), Take 1 tablet by mouth daily.  Current Outpatient Medications (Other):    amphetamine-dextroamphetamine (ADDERALL XR) 20 MG 24 hr capsule, Take 20 mg by mouth daily.   Cholecalciferol 125 MCG (5000 UT) capsule, Take 2 capsules (10,000 Units total) by mouth daily.   Flaxseed, Linseed, (FLAX SEED OIL) 1000 MG CAPS, Take 1,000 mg by mouth daily.    FLUoxetine (PROZAC) 20 MG capsule, Take 1 capsule (20 mg total) by mouth daily.   hyoscyamine (ANASPAZ) 0.125 MG TBDP disintergrating tablet, Place 1 tablet (0.125 mg total) under the tongue every 6 (six) hours as needed.   magnesium gluconate (MAGONATE) 500 MG tablet, Take 500 mg by mouth daily.   Multiple Vitamin (MULTIVITAMIN WITH MINERALS) TABS tablet, Take 1 tablet by mouth daily.   pantoprazole (PROTONIX) 40  MG tablet, Take 1 tablet (40 mg total) by mouth daily.  ROS: all negative except above.   Physical Exam: There were no vitals filed for this visit.  There were no vitals taken for this visit.  General Appearance: Well nourished, in no apparent distress. Eyes: PERRLA, EOMs, conjunctiva no swelling or erythema Sinuses: No Frontal/maxillary tenderness ENT/Mouth: Ext aud canals clear, TMs without erythema, bulging. No erythema, swelling, or exudate on post pharynx.  Tonsils not swollen or erythematous. Hearing normal.  Neck: Supple, thyroid normal.  Respiratory: Respiratory effort normal, BS equal bilaterally without rales, rhonchi, wheezing or stridor.  Cardio: RRR with no MRGs. Brisk peripheral pulses without edema.  Abdomen: Soft, obese abdomen, + BS.  Non tender, no guarding, rebound, hernias, masses. Lymphatics: Non tender without lymphadenopathy.  Musculoskeletal: Full ROM, 5/5 strength, normal gait.  Skin: Warm, dry without rashes, lesions, ecchymosis.  Neuro: Cranial nerves intact. Normal muscle tone, no cerebellar symptoms. Sensation intact.  Psych: Awake and  oriented X 3, normal affect, Insight and Judgment appropriate.     [name], NP 8:55 AM [facility]

## 2021-10-02 ENCOUNTER — Ambulatory Visit: Payer: 59 | Admitting: Adult Health

## 2021-10-02 DIAGNOSIS — Z79899 Other long term (current) drug therapy: Secondary | ICD-10-CM

## 2021-10-02 DIAGNOSIS — F331 Major depressive disorder, recurrent, moderate: Secondary | ICD-10-CM

## 2021-10-02 DIAGNOSIS — F411 Generalized anxiety disorder: Secondary | ICD-10-CM

## 2021-10-02 DIAGNOSIS — E782 Mixed hyperlipidemia: Secondary | ICD-10-CM

## 2021-10-02 DIAGNOSIS — E559 Vitamin D deficiency, unspecified: Secondary | ICD-10-CM

## 2021-10-02 DIAGNOSIS — K219 Gastro-esophageal reflux disease without esophagitis: Secondary | ICD-10-CM

## 2021-10-02 DIAGNOSIS — F909 Attention-deficit hyperactivity disorder, unspecified type: Secondary | ICD-10-CM

## 2021-10-16 ENCOUNTER — Ambulatory Visit (INDEPENDENT_AMBULATORY_CARE_PROVIDER_SITE_OTHER): Payer: 59 | Admitting: Adult Health

## 2021-10-16 ENCOUNTER — Encounter: Payer: Self-pay | Admitting: Adult Health

## 2021-10-16 VITALS — BP 120/80 | HR 93 | Temp 97.5°F | Wt 241.0 lb

## 2021-10-16 DIAGNOSIS — E782 Mixed hyperlipidemia: Secondary | ICD-10-CM | POA: Diagnosis not present

## 2021-10-16 DIAGNOSIS — F1010 Alcohol abuse, uncomplicated: Secondary | ICD-10-CM

## 2021-10-16 DIAGNOSIS — F4329 Adjustment disorder with other symptoms: Secondary | ICD-10-CM

## 2021-10-16 DIAGNOSIS — R030 Elevated blood-pressure reading, without diagnosis of hypertension: Secondary | ICD-10-CM

## 2021-10-16 DIAGNOSIS — E559 Vitamin D deficiency, unspecified: Secondary | ICD-10-CM

## 2021-10-16 DIAGNOSIS — Z6841 Body Mass Index (BMI) 40.0 and over, adult: Secondary | ICD-10-CM

## 2021-10-16 DIAGNOSIS — F411 Generalized anxiety disorder: Secondary | ICD-10-CM

## 2021-10-16 DIAGNOSIS — F909 Attention-deficit hyperactivity disorder, unspecified type: Secondary | ICD-10-CM

## 2021-10-16 DIAGNOSIS — Z79899 Other long term (current) drug therapy: Secondary | ICD-10-CM

## 2021-10-16 DIAGNOSIS — F331 Major depressive disorder, recurrent, moderate: Secondary | ICD-10-CM

## 2021-10-16 NOTE — Progress Notes (Signed)
Assessment and Plan:   Recurrent major depressive disorder, in partial remission (HCC)/ Generalized anxiety disorder Much improved with prozac 20 mg daily; continue current regimen  She has access to counselor with perceived benefit Stress management techniques discussed, increase water, good sleep hygiene discussed, increase exercise, and increase veggies.   Excessive drinking of alcohol Has not decreased further Ideally try to limit to 1/day, <4/week   Gastroesophageal reflux disease, unspecified whether esophagitis present she has stopped marijuana and reduced ETOH with improved sx GI follows  Continue PPI for now; discussed possible taper plan to [facility] if continues to do well but will defer decision to GI Discussed diet, avoiding triggers and other lifestyle changes  Mixed hyperlipidemia She has been working on lifestyle Would not start medication at this time Encourage low saturated fat diet, increase fiber, weight loss - Lipid panel   Vitamin D deficiency Restart regular supplement for goal range 60-10 Discussed with patient, defer check today  Morbid Obesity ([employer]) - BMI 40 She is making slow sustainable changes with weight slowly trending down Commended excellent progress - 20 lb down since last visit! Long discussion about weight loss, diet, and exercise Recommended minimally processed plant heavy diet Encourage regular exercise Encouraged small sustainable changes, building lifestyle habits  Next goal (<225 lb) Follow up at next visit   Future Appointments  Date Time Provider [place]  02/14/2022 10:00 AM Liane Comber, NP [facility] None     HPI 36 y.o.female with history of depression, anxiety, GERD, alcohol use, hyperlipidemia presents for  6 month follow up.   Step father passed unexpectedly with pancreatic cancer, mother was diagnosed with lung cancer, started on chemo, reports has been managing, doing better. Work is busy.   She was started  on effexor for depression in 2020 while in school, had poor motivation and started wellbutrin but developed hives, hx of celexa and zoloft without benefit/poor tolerance, now taking prozac 20 mg daily and reports doing well. Denies SI/HI. Counseling as needed.   Hx of N/V due to excessive alcohol use and marijuana use,  followed with GI and had EGD showing gastritis/esophagitis, neg H pylori. She has stopped smoking and reports has not had any nausea/vomiting symptoms. She unfortunately continues to drink ETOH 3-4 days a week, 2-3 glasses of wine. She continues with pantoprazole 40 mg daily and denies GERD sx.   She was dx with ADHD by Dr. Johnnye Sima, has been on adderall with good results.   BMI is Body mass index is 40.1 kg/m., she is working on diet and also finds adderall helps with appetite.  Wt Readings from Last 3 Encounters:  10/16/21 241 lb (109.3 kg)  02/14/21 261 lb 3.2 oz (118.5 kg)  11/17/20 262 lb (118.8 kg)   Her blood pressure has been controlled at home, today their BP is BP: 120/80  She does not workout. She denies chest pain, shortness of breath, dizziness.   She is not on cholesterol medication and denies myalgias. Mom with high cholesterol, MGF and MGM with heart disease.   Her cholesterol is not at goal, has been working on lifestyle.  The cholesterol last visit was:   Lab Results  Component Value Date   CHOL 248 (H) 02/14/2021   HDL 74 02/14/2021   LDLCALC 133 (H) 02/14/2021   TRIG 268 (H) 02/14/2021   CHOLHDL 3.4 02/14/2021   Last A1C in the office was:  Lab Results  Component Value Date   HGBA1C 4.9 02/14/2021   Patient is  on Vitamin D supplement, recommended 5000 IU daily but admits hasn't been taking regularly. Lab Results  Component Value Date   VD25OH 23 (L) 02/14/2021     Has been taking B12 SL, unsure of dose, ? 1000 mcg but irregular Lab Results  Component Value Date   VITAMINB12 281 02/14/2021     Past Medical History:  Diagnosis Date    Abnormal Pap smear    Acid reflux    Allergy    Anemia    Anxiety    Cannabinoid hyperemesis syndrome 02/28/2019   Chlamydia 2006   Depression    Fracture    Hypercholesterolemia    IBS (irritable bowel syndrome)    Vitamin D deficiency      Allergies  Allergen Reactions   Wellbutrin [Bupropion] Hives   Zoloft [Sertraline] Other (See Comments)    Excessive sweating, insomnia   Neomycin Rash      Current Outpatient Medications (Respiratory):    albuterol (PROVENTIL HFA;VENTOLIN HFA) 108 (90 Base) MCG/ACT inhaler, Inhale 2 puffs into the lungs every 4 hours as needed for wheezing or shortness of breath. (Patient taking differently: Inhale 2 puffs into the lungs every 4 (four) hours as needed for wheezing or shortness of breath.)   cetirizine (ZYRTEC) 10 MG tablet, Take 10 mg by mouth daily.   Current Outpatient Medications (Hematological):    Cyanocobalamin (B-12 PO), Take 1 tablet by mouth daily.   Ferrous Sulfate (IRON PO), Take 1 tablet by mouth daily. (Patient not taking: Reported on 10/16/2021)  Current Outpatient Medications (Other):    amphetamine-dextroamphetamine (ADDERALL XR) 20 MG 24 hr capsule, Take 20 mg by mouth daily.   Cholecalciferol 125 MCG (5000 UT) capsule, Take 2 capsules (10,000 Units total) by mouth daily.   Flaxseed, Linseed, (FLAX SEED OIL) 1000 MG CAPS, Take 1,000 mg by mouth daily.    FLUoxetine (PROZAC) 20 MG capsule, Take 1 capsule (20 mg total) by mouth daily.   hyoscyamine (ANASPAZ) 0.125 MG TBDP disintergrating tablet, Place 1 tablet (0.125 mg total) under the tongue every 6 (six) hours as needed.   Multiple Vitamin (MULTIVITAMIN WITH MINERALS) TABS tablet, Take 1 tablet by mouth daily.   pantoprazole (PROTONIX) 40 MG tablet, Take 1 tablet (40 mg total) by mouth daily.   magnesium gluconate (MAGONATE) 500 MG tablet, Take 500 mg by mouth daily. (Patient not taking: Reported on 10/16/2021)  ROS: all negative except above.   Physical Exam: Filed  Weights   10/16/21 1427  Weight: 241 lb (109.3 kg)    BP 120/80   Pulse 93   Temp (!) 97.5 F (36.4 C)   Wt 241 lb (109.3 kg)   SpO2 99%   BMI 40.10 kg/m   General Appearance: Well nourished, in no apparent distress. Eyes: PERRLA, EOMs, conjunctiva no swelling or erythema Sinuses: No Frontal/maxillary tenderness ENT/Mouth: Ext aud canals clear, TMs without erythema, bulging. No erythema, swelling, or exudate on post pharynx.  Tonsils not swollen or erythematous. Hearing normal.  Neck: Supple, thyroid normal.  Respiratory: Respiratory effort normal, BS equal bilaterally without rales, rhonchi, wheezing or stridor.  Cardio: RRR with no MRGs. Brisk peripheral pulses without edema.  Abdomen: Soft, obese abdomen, + BS.  Non tender, no guarding, rebound, hernias, masses. Lymphatics: Non tender without lymphadenopathy.  Musculoskeletal: Full ROM, 5/5 strength, normal gait.  Skin: Warm, dry without rashes, lesions, ecchymosis.  Neuro: Cranial nerves intact. Normal muscle tone, no cerebellar symptoms. Sensation intact.  Psych: Awake and oriented X 3, normal affect, Insight  and Judgment appropriate.    [name], NP 2:50 PM [facility]

## 2021-10-17 ENCOUNTER — Other Ambulatory Visit: Payer: Self-pay | Admitting: Adult Health

## 2021-10-17 DIAGNOSIS — R7989 Other specified abnormal findings of blood chemistry: Secondary | ICD-10-CM | POA: Insufficient documentation

## 2021-10-17 LAB — COMPLETE METABOLIC PANEL WITH GFR
AG Ratio: 1.7 (calc) (ref 1.0–2.5)
ALT: 26 U/L (ref 6–29)
AST: 43 U/L — ABNORMAL HIGH (ref 10–30)
Albumin: 4.6 g/dL (ref 3.6–5.1)
Alkaline phosphatase (APISO): 64 U/L (ref 31–125)
BUN: 9 mg/dL (ref 7–25)
CO2: 23 mmol/L (ref 20–32)
Calcium: 9.5 mg/dL (ref 8.6–10.2)
Chloride: 102 mmol/L (ref 98–110)
Creat: 0.78 mg/dL (ref 0.50–0.97)
Globulin: 2.7 g/dL (calc) (ref 1.9–3.7)
Glucose, Bld: 83 mg/dL (ref 65–99)
Potassium: 4.3 mmol/L (ref 3.5–5.3)
Sodium: 137 mmol/L (ref 135–146)
Total Bilirubin: 0.4 mg/dL (ref 0.2–1.2)
Total Protein: 7.3 g/dL (ref 6.1–8.1)
eGFR: 100 mL/min/{1.73_m2} (ref >=60)

## 2021-10-17 LAB — LIPID PANEL
Cholesterol: 268 mg/dL — ABNORMAL HIGH (ref <200)
HDL: 81 mg/dL (ref >=50)
LDL Cholesterol (Calc): 143 mg/dL (calc) — ABNORMAL HIGH
Non-HDL Cholesterol (Calc): 187 mg/dL (calc) — ABNORMAL HIGH (ref <130)
Total CHOL/HDL Ratio: 3.3 (calc) (ref <5.0)
Triglycerides: 286 mg/dL — ABNORMAL HIGH (ref <150)

## 2021-11-19 ENCOUNTER — Other Ambulatory Visit: Payer: 59

## 2021-11-19 DIAGNOSIS — R7989 Other specified abnormal findings of blood chemistry: Secondary | ICD-10-CM

## 2021-11-20 LAB — HEPATIC FUNCTION PANEL
AG Ratio: 1.5 (calc) (ref 1.0–2.5)
ALT: 13 U/L (ref 6–29)
AST: 14 U/L (ref 10–30)
Albumin: 4.2 g/dL (ref 3.6–5.1)
Alkaline phosphatase (APISO): 66 U/L (ref 31–125)
Bilirubin, Direct: 0.1 mg/dL (ref 0.0–0.2)
Globulin: 2.8 g/dL (calc) (ref 1.9–3.7)
Indirect Bilirubin: 0.3 mg/dL (calc) (ref 0.2–1.2)
Total Bilirubin: 0.4 mg/dL (ref 0.2–1.2)
Total Protein: 7 g/dL (ref 6.1–8.1)

## 2021-11-20 LAB — HEPATITIS PANEL, ACUTE
Hep A IgM: NONREACTIVE
Hep B C IgM: NONREACTIVE
Hepatitis B Surface Ag: NONREACTIVE
Hepatitis C Ab: NONREACTIVE

## 2021-11-29 ENCOUNTER — Other Ambulatory Visit: Payer: Self-pay | Admitting: Internal Medicine

## 2021-11-29 MED ORDER — FLUOXETINE HCL 20 MG PO CAPS: ORAL_CAPSULE | ORAL | 1 refills | Status: DC

## 2021-12-13 ENCOUNTER — Telehealth: Payer: 59 | Admitting: Physician Assistant

## 2021-12-13 DIAGNOSIS — L0292 Furuncle, unspecified: Secondary | ICD-10-CM | POA: Diagnosis not present

## 2021-12-13 MED ORDER — DOXYCYCLINE HYCLATE 100 MG PO TABS: 100.0000 mg | ORAL_TABLET | Freq: Two times a day (BID) | ORAL | 0 refills | Status: DC

## 2021-12-13 NOTE — Progress Notes (Signed)
I have spent 5 minutes in review of e-visit questionnaire, review and updating patient chart, medical decision making and response to patient.   Judy Goodenow Cody Yamileth Hayse, PA-C    

## 2021-12-13 NOTE — Progress Notes (Signed)
E Visit for Cellulitis  We are sorry that you are not feeling well. Here is how we plan to help!  Based on what you shared with me it looks like you have boils and giving multiple popping up, I want to make sure we cover for a MRSA skin infection. Thankfully this does not seem to have moved into a full-blown cellulitis.  Cellulitis looks like areas of skin redness, swelling, and warmth; it develops as a result of bacteria entering under the skin. Little red spots and/or bleeding can be seen in skin, and tiny surface sacs containing fluid can occur. Fever can be present. Cellulitis is almost always on one side of a body, and the lower limbs are the most common site of involvement.   I have prescribed:  Doxycycline 100 mg twice daily for 7 days.   HOME CARE:  Take your medications as ordered and take all of them, even if the skin irritation appears to be healing.   GET HELP RIGHT AWAY IF:  Symptoms that don't begin to go away within 48 hours. Severe redness persists or worsens If the area turns color, spreads or swells. If it blisters and opens, develops yellow-brown crust or bleeds. You develop a fever or chills. If the pain increases or becomes unbearable.  Are unable to keep fluids and food down.  MAKE SURE YOU   Understand these instructions. Will watch your condition. Will get help right away if you are not doing well or get worse.  Thank you for choosing an e-visit.  Your e-visit answers were reviewed by a board certified advanced clinical practitioner to complete your personal care plan. Depending upon the condition, your plan could have included both over the counter or prescription medications.  Please review your pharmacy choice. Make sure the pharmacy is open so you can pick up prescription now. If there is a problem, you may contact your provider through [employer] and have the prescription routed to another pharmacy.  Your safety is important to [place]. If you have drug  allergies check your prescription carefully.   For the next 24 hours you can use MyChart to ask questions about today's visit, request a non-urgent call back, or ask for a work or school excuse. You will get an email in the next two days asking about your experience. I hope that your e-visit has been valuable and will speed your recovery.

## 2021-12-27 IMAGING — CT CT RENAL STONE PROTOCOL
2 of 3 series · 12 of 36 positions shown, 17 images · non-contrast
Comparison: CT abdomen and pelvis-02/24/2019

CLINICAL DATA: Flank pain. Right-sided low back pain. Hyperemesis.

EXAM:
CT ABDOMEN AND PELVIS WITHOUT CONTRAST
TECHNIQUE: Multidetector CT imaging of the abdomen and pelvis was performed
following the standard protocol without IV contrast.

[Series 2: axial st · axial · 0.70mm/px · z∈[+1145,+1560]mm · 11 of 95 slices shown, 15 images]
[im 6/95  soft-tissue]
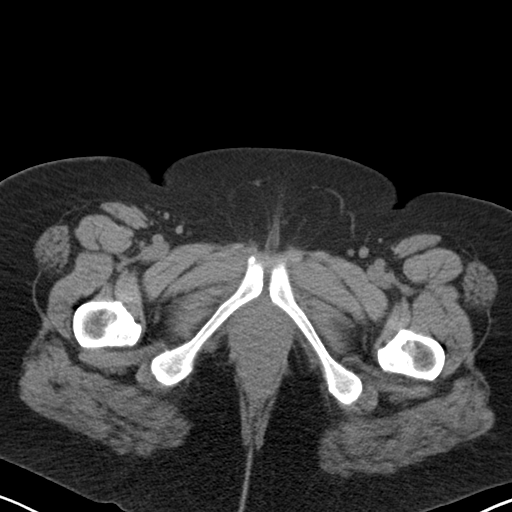
[im 6/95  bone]
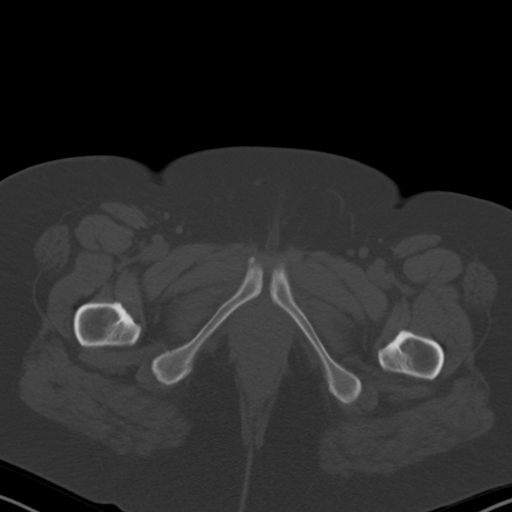
[im 16/95  soft-tissue]
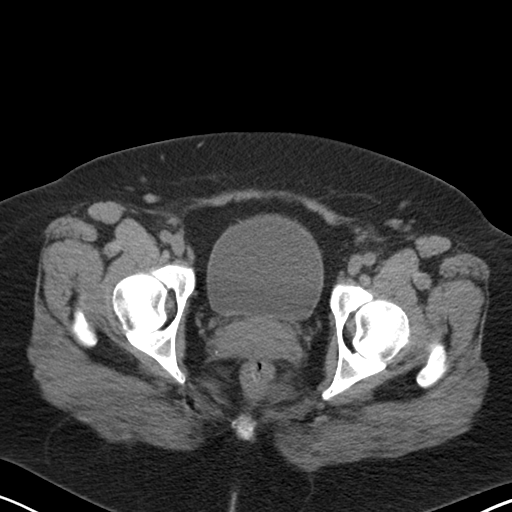
[im 27/95  soft-tissue]
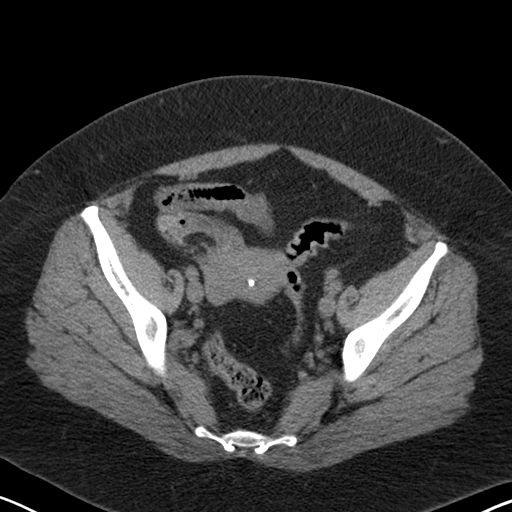
[im 37/95  soft-tissue]
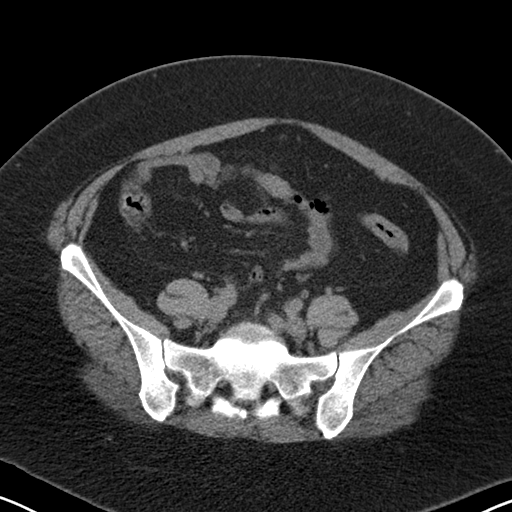
[im 48/95  soft-tissue]
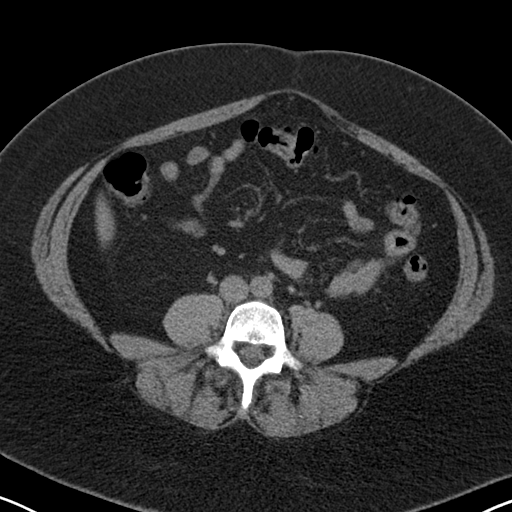
[im 58/95  soft-tissue]
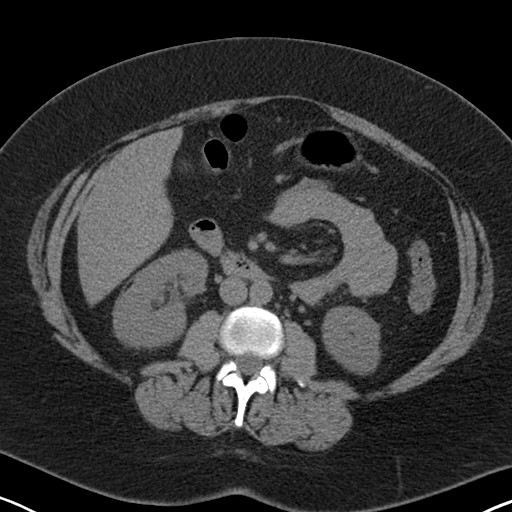
[im 68/95  soft-tissue]
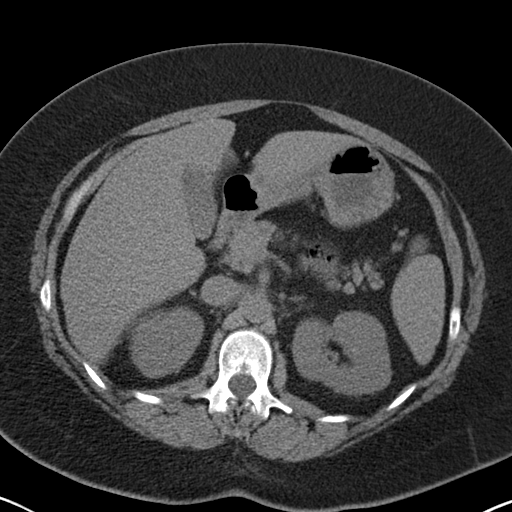
[im 74/95  lung]
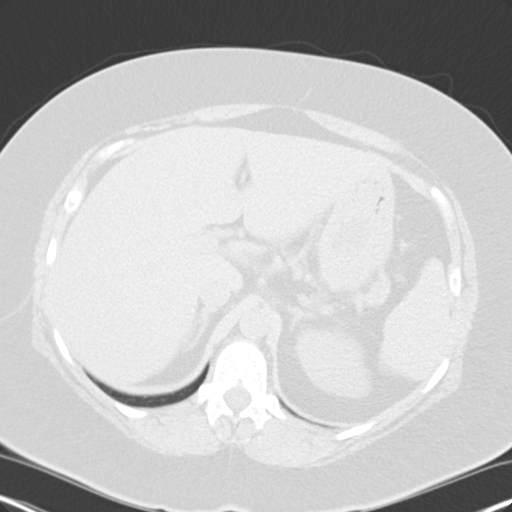
[im 79/95  soft-tissue]
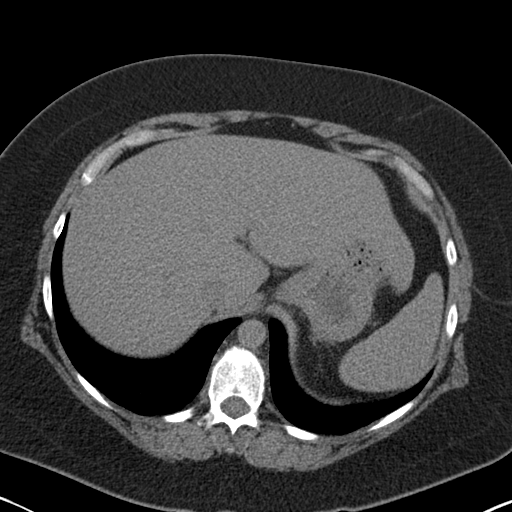
[im 79/95  lung]
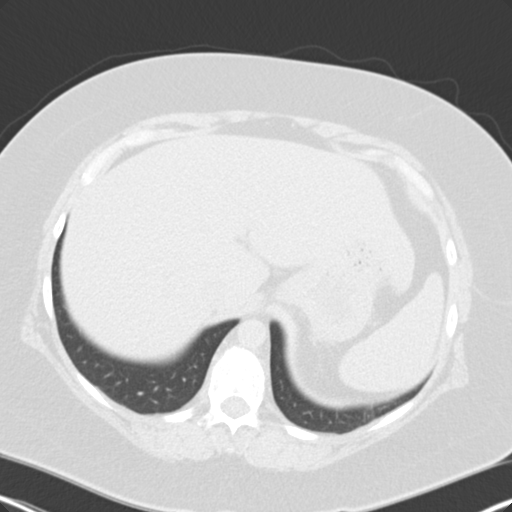
[im 84/95  lung]
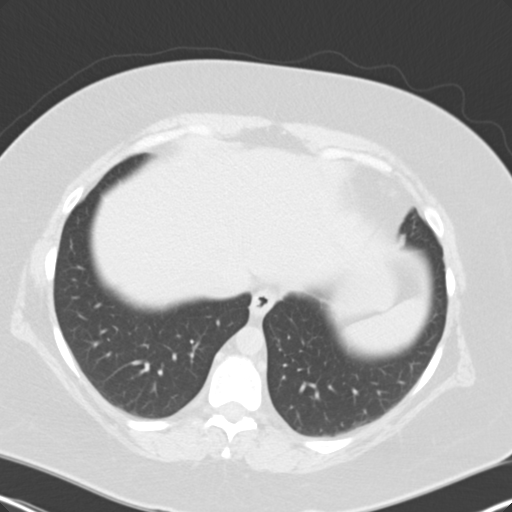
[im 89/95  soft-tissue]
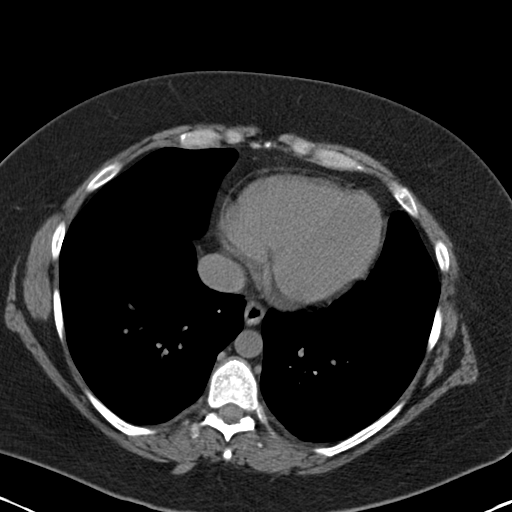
[im 89/95  lung]
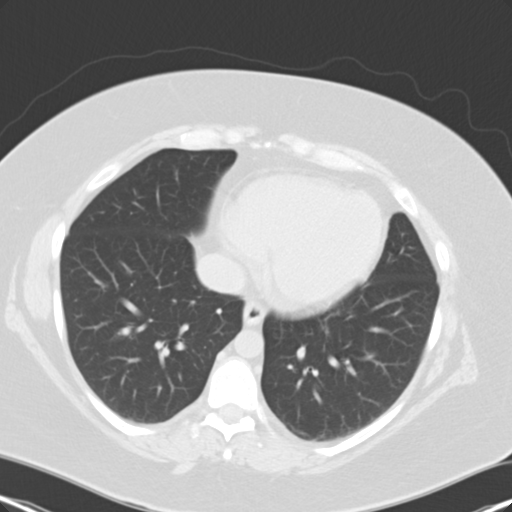
[im 89/95  bone]
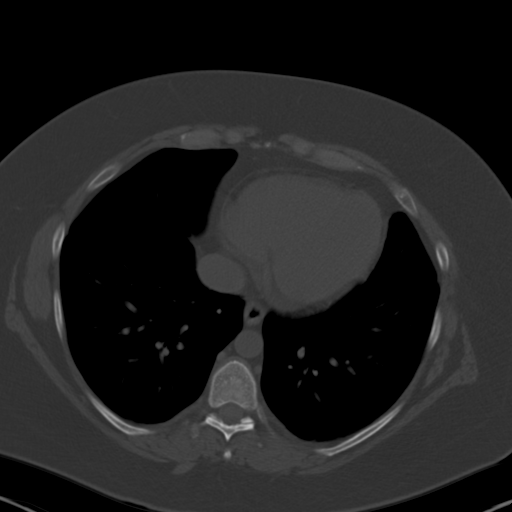

[Series 5: sagittal · sagittal · 0.62mm/px · 1 of 169 slices shown, 2 images]
[im 57/169  soft-tissue]
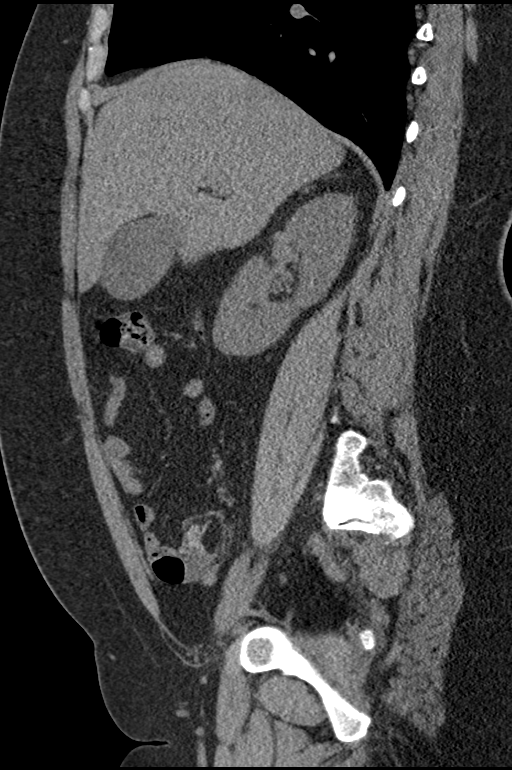
[im 57/169  bone]
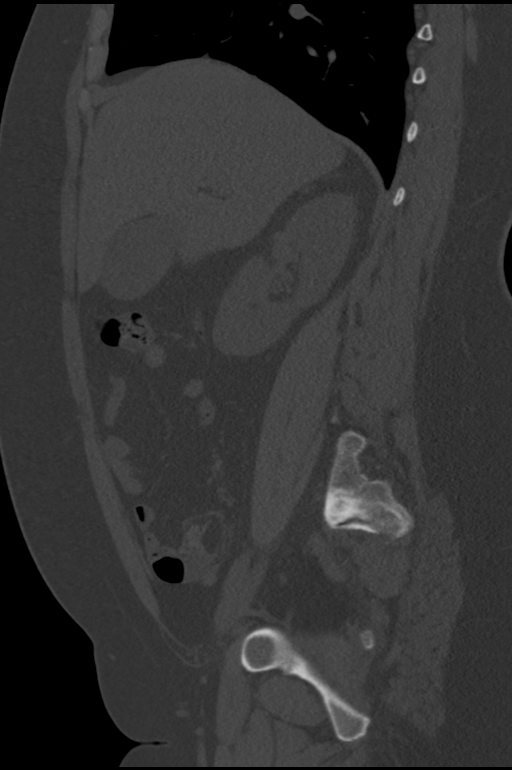

[12 of 36 positions shown; findings below may reference images not displayed]

FINDINGS: The lack of intravenous contrast limits the ability to evaluate
solid abdominal organs.

Lower chest: Limited visualization of the lower thorax demonstrates
minimal dependent subpleural ground-glass atelectasis, most
conspicuous within the left lower lobe. No discrete focal airspace
opacities. No pleural effusion.

Normal heart size.  No pericardial effusion.

Hepatobiliary: Normal hepatic contour. Normal noncontrast appearance
of the gallbladder given degree of distention. No radiopaque
gallstones. No ascites.

Pancreas: Normal noncontrast appearance of the pancreas.

Spleen: Normal noncontrast appearance of the spleen.

Adrenals/Urinary Tract: Note is made of a slightly exaggerated
horizontal lie of the right kidney. There is a very minimal amount
of grossly symmetric likely body habitus related perinephric
stranding. No renal stones. No renal stones are seen along the
expected course of either ureter or within the urinary bladder. No
evidence of urinary obstruction.

Normal noncontrast appearance the bilateral adrenal glands.

Normal noncontrast appearance the urinary bladder given degree of
distention.

Stomach/Bowel: Bowel is normal in course and caliber without
discrete area of wall thickening on this noncontrast examination. No
evidence of enteric obstruction. Normal noncontrast appearance of
the terminal ileum and the appendix. No pneumoperitoneum,
pneumatosis or portal venous gas.

Vascular/Lymphatic: Normal caliber of the abdominal aorta.

No bulky retroperitoneal, mesenteric, pelvic or inguinal
lymphadenopathy on this noncontrast examination.

Reproductive: Appropriately positioned intrauterine device. No
discrete adnexal lesion on this noncontrast study. No free fluid the
pelvic cul-de-sac.

Other: Tiny mesenteric fat containing periumbilical hernia. There is
a minimal amount of subcutaneous edema about the midline of the low
back.

Musculoskeletal: No acute or aggressive osseous abnormalities. Mild
increased sclerosis about the bilateral SI joints, right greater
than left.
IMPRESSION: 1. No explanation for patient's right-sided flank pain.
Specifically, no evidence of nephrolithiasis, urinary or enteric
obstruction. Normal noncontrast appearance of the appendix.
2. Appropriately positioned intrauterine device.

## 2022-02-13 NOTE — Progress Notes (Signed)
Complete Physical  Assessment and Plan:  Encounter for Annual Physical Exam with abnormal findings Due annually  Health Maintenance reviewed Healthy lifestyle reviewed and goals set  Recurrent major depressive disorder, in partial remission ([place])  Doing well with prozac Continue to reduce ETOH; lifestyle discussed; sleeps fairly; exercise encouraged No SI/HI  Generalized anxiety disorder Continue to reduce alcohol; much improved   Gastroesophageal reflux disease, unspecified whether esophagitis present Monitor, normal [place], continue PPI and limit ETOH  IBS-D Improved, continue to monitor, add soluble fiber  Hyperlipidemia, mixed Recommend weight loss, low cholesterol/saturated fat diet, increase fiber and exercise.  LDL goal <100;  Check lipid panel.  -     Lipid panel -     TSH  Vitamin D deficiency Continue Vit D supplementation to maintain value in therapeutic level of 60-100  -     VITAMIN D 25 Hydroxy (Vit-D Deficiency, Fractures)  Medication management -     CBC with Differential/Platelet -     COMPLETE METABOLIC PANEL WITH GFR -     TSH -     Magnesium  Elevated LFT Limit Tylenol and alcohol use - CMP  Abnormal Glucose Continue diet and exercise -     Hemoglobin A1c  Screening for thyroid disorder - TSH  Screening for hematuria or proteinuria -     Urinalysis, Routine w reflex microscopic  Morbid obesity ([place])- BMi 43 Long discussion about weight loss, diet, and exercise Recommended diet heavy in fruits and veggies and low in animal meats, cheeses, and dairy products, appropriate calorie intake Patient will work on moving more, decreasing saturated fats and simple carbs Follow up at next visit -     Lipid panel -     TSH -     Hemoglobin A1c  Flu Vaccine Need Flu QUAD 6+ MOS PF IM given  Environmental allergies Continue Zyrtec and rotate with allegra Flonase 2 sprays each nostril daily Sudafed as needed    Discussed med's effects and  SE's. Screening labs and tests as requested with regular follow-up as recommended. Over 40 minutes of exam, counseling, chart review, and complex, high level critical decision making was performed this visit.   Future Appointments  Date Time Provider [place]  02/19/2023  9:00 AM Alycia Rossetti, NP [facility] None     HPI  36 y.o. female  presents for a complete physical and follow up for has Vitamin D deficiency; Depression; Generalized anxiety disorder; Medication management; GERD (gastroesophageal reflux disease); Irritable bowel syndrome (IBS); Excessive drinking of alcohol; Stress and adjustment reaction; Morbid obesity with BMI of 40.0-44.9, adult ([place]); Elevated BP without diagnosis of hypertension; ADHD; Mixed hyperlipidemia; B12 deficiency; ASCUS of cervix with negative high risk HPV; and LFT elevation on their problem list..  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 She has a 8 y/ and 37 y/o boys. Divorced and happy. She is currently sexually active. Follows with GYN at [facility] annually. Has had abnormal PAPs.  She did have cold symptoms 2 weeks ago that was negative for Covid.  She does have some nasal drainage that is clear. She has a persistent productive cough- light yellow in color.  Denies headaches, sore throat, nausea, vomiting, fevers and body aches. Mucinex and Sudafed have helped some.   She has also been noticing that her left great toe has been going numb for approximately the past 6 weeks. No new shoes currently  She is only drinking 2-3 glasses  of wine 2-3 times a week.  US showed normal gallbladder, did show fatty liver. She was referred to GI, had CT that showed rotated IUD, has seen GYN, but ultimately sx improved after quitting marijuana. GERD well controlled on protonix. Also IBS with loose stools, but GI felt due to improvement no further workup needed at this time. Has reduced from 5 drinks a  night to 2-3 nights.   She has hx of depression/anxiety; poorly tolerated wellbutrin, now on prozac 20 mg and doing very well. She has ADHD, on adderall 20 mg XR from Dr. Johnnye Sima.   She continues with protonix daily per GI for long GERD hx.   BMI is Body mass index is 39.92 kg/m., she is working on diet/exercise, was doing noom and an exercise program. ary drinks, some cravings.  She is down 25 pounds from last year- she is walking more.  Trying to eat less fast foods. Wt Readings from Last 3 Encounters:  02/15/22 236 lb 3.2 oz (107.1 kg)  10/16/21 241 lb (109.3 kg)  02/14/21 261 lb 3.2 oz (118.5 kg)    Today their BP is BP: 122/88  BP Readings from Last 3 Encounters:  02/15/22 122/88  10/16/21 120/80  02/14/21 110/80  She does not workout. She denies chest pain, shortness of breath, dizziness.    She is not on cholesterol medication and denies myalgias. Her cholesterol is not at goal. The cholesterol last visit was:   Lab Results  Component Value Date   CHOL 268 (H) 10/16/2021   HDL 81 10/16/2021   LDLCALC 143 (H) 10/16/2021   TRIG 286 (H) 10/16/2021   CHOLHDL 3.3 10/16/2021   She does have a history of abnormal glucose. Last A1C in the office was:  Lab Results  Component Value Date   HGBA1C 4.9 02/14/2021   Patient is on Vitamin D supplement, taking 10000 IU inconsistently.  Lab Results  Component Value Date   VD25OH 23 (L) 02/14/2021     Lab Results  Component Value Date   IRON 113 02/14/2021   TIBC 379 02/14/2021   FERRITIN 92 02/14/2021   Lab Results  Component Value Date   VITAMINB12 281 02/14/2021     Current Medications:  Current Outpatient Medications on File Prior to Visit  Medication Sig Dispense Refill   albuterol (PROVENTIL HFA;VENTOLIN HFA) 108 (90 Base) MCG/ACT inhaler Inhale 2 puffs into the lungs every 4 hours as needed for wheezing or shortness of breath. (Patient taking differently: Inhale 2 puffs into the lungs every 4 (four) hours as  needed for wheezing or shortness of breath.) 3 Inhaler 0   amphetamine-dextroamphetamine (ADDERALL XR) 30 MG 24 hr capsule TAKE 1 CAPSULE BY MOUTH DAILY HAVING TO CHANGE ONLY BECAUSE OF NATIONAL SHORTAGE     cetirizine (ZYRTEC) 10 MG tablet Take 10 mg by mouth daily.     Cholecalciferol 125 MCG (5000 UT) capsule Take 2 capsules (10,000 Units total) by mouth daily.     Cyanocobalamin (B-12 PO) Take 1 tablet by mouth daily.     Flaxseed, Linseed, (FLAX SEED OIL) 1000 MG CAPS Take 1,000 mg by mouth daily.      FLUoxetine (PROZAC) 20 MG capsule Take  1 capsule  Daily  for Mood 90 capsule 1   folic acid (FOLVITE) 1 MG tablet Take 1 mg by mouth daily.     Multiple Vitamin (MULTIVITAMIN WITH MINERALS) TABS tablet Take 1 tablet by mouth daily.     pantoprazole (PROTONIX) 40 MG tablet Take  1 tablet (40 mg total) by mouth daily. 90 tablet 3   amphetamine-dextroamphetamine (ADDERALL XR) 20 MG 24 hr capsule Take 20 mg by mouth daily. (Patient not taking: Reported on 02/15/2022)     doxycycline (VIBRA-TABS) 100 MG tablet Take 1 tablet (100 mg total) by mouth 2 (two) times daily. (Patient not taking: Reported on 02/15/2022) 14 tablet 0   hyoscyamine (ANASPAZ) 0.125 MG TBDP disintergrating tablet Place 1 tablet (0.125 mg total) under the tongue every 6 (six) hours as needed. (Patient not taking: Reported on 02/15/2022) 30 tablet 2   No current facility-administered medications on file prior to visit.   Allergies:  Allergies  Allergen Reactions   Wellbutrin [Bupropion] Hives   Zoloft [Sertraline] Other (See Comments)    Excessive sweating, insomnia   Neomycin Rash   Medical History:  She has Vitamin D deficiency; Depression; Generalized anxiety disorder; Medication management; GERD (gastroesophageal reflux disease); Irritable bowel syndrome (IBS); Excessive drinking of alcohol; Stress and adjustment reaction; Morbid obesity with BMI of 40.0-44.9, adult ([place]); Elevated BP without diagnosis of hypertension;  ADHD; Mixed hyperlipidemia; B12 deficiency; ASCUS of cervix with negative high risk HPV; and LFT elevation on their problem list.   Health Maintenance:   Immunization History  Administered Date(s) Administered   Influenza Inj Mdck Quad With Preservative 01/09/2017, 01/27/2020   Influenza,inj,Quad PF,6+ Mos 11/28/2018, 11/28/2018   Influenza-Unspecified 01/28/2021   PFIZER(Purple Top)SARS-COV-2 Vaccination 06/29/2019, 07/20/2019, 02/10/2020, 01/27/2021   PPD Test 12/09/2013   Tdap 11/19/2010, 02/15/2013, 09/15/2014   Tetanus: 2016 Pneumovax: Prevnar 13:  Flu vaccine: today Covid 19: 2/2, pfizer, + boosters  Pap: Getting annually at GYN due to abnormal  MGM: N/A AB [place] 10/2018 normal gallbladder, fatty liver DEXA:n/v  Colonoscopy: age 43 EGD: 03/2019, gastritis, hiatal hernia   Last eye: [place] [facility] annually, glasses [facility]  Patient Care Team: MUnk Pinto MD as PCP - General (Internal Medicine) [place]ke [place](Optometry) AMurrell ReddenSEarlyne Iba MD as Consulting Physician (Obstetrics and Gynecology)  Surgical History:  She has a past surgical history that includes Wisdom tooth extraction and Colposcopy (04/08/2004). Family History:  Herfamily history includes Asthma in her sister; Cancer in her paternal grandfather and paternal grandmother; Diabetes in her maternal grandmother; Diverticulitis in her father; Heart disease in her maternal grandfather; Hyperlipidemia in her mother; Hypertension in her maternal grandmother; Osteopenia in her mother. Social History:  She reports that she quit smoking about 15 years ago. Her smoking use included cigarettes. She smoked an average of 1 pack per day. She has never used smokeless tobacco. She reports current alcohol use of about 2.0 - 3.0 standard drinks of alcohol per week. She reports that she does not currently use drugs after having used the following drugs:  Marijuana.  Review of Systems: Review of Systems  Constitutional: Negative.  Negative for malaise/fatigue and weight loss.  HENT:  Positive for congestion. Negative for hearing loss and tinnitus.   Eyes: Negative.  Negative for blurred vision and double vision.  Respiratory:  Positive for cough. Negative for shortness of breath and wheezing.   Cardiovascular: Negative.  Negative for chest pain, palpitations, orthopnea, claudication and leg swelling.  Gastrointestinal:  Positive for heartburn (improved, rare). Negative for abdominal pain, blood in stool, constipation, diarrhea, melena, nausea and vomiting.  Genitourinary: Negative.   Musculoskeletal: Negative.  Negative for joint pain and myalgias.  Skin: Negative.  Negative for rash.  Neurological:  Negative for dizziness, tingling,  sensory change, weakness and headaches.  Endo/Heme/Allergies:  Negative for polydipsia.  Psychiatric/Behavioral:  Negative for depression, hallucinations, memory loss and suicidal ideas. The patient is not nervous/anxious and does not have insomnia.   All other systems reviewed and are negative.   Physical Exam: Estimated body mass index is 39.92 kg/m as calculated from the following:   Height as of this encounter: 5' 4.5" (1.638 m).   Weight as of this encounter: 236 lb 3.2 oz (107.1 kg). BP 122/88   Pulse 100   Temp (!) 97.3 F (36.3 C)   Ht 5' 4.5" (1.638 m)   Wt 236 lb 3.2 oz (107.1 kg)   LMP 02/04/2022   SpO2 98%   BMI 39.92 kg/m  General Appearance: Well nourished, well dressed, morbidly obese female in no apparent distress.  Eyes: PERRLA, EOMs, conjunctiva no swelling or erythema Sinuses: No Frontal/maxillary tenderness  ENT/Mouth: Ext aud canals clear. TM's fluid noted, no erythema. Good dentition. No erythema, swelling, or exudate on post pharynx. Tonsils not swollen or erythematous. Hearing normal.  Neck: Supple, thyroid normal. No bruits  Respiratory: Respiratory effort normal, BS equal  bilaterally without rales, rhonchi, wheezing or stridor.  Cardio: RRR without murmurs, rubs or gallops. Brisk peripheral pulses without edema.  Chest: symmetric, with normal excursions and percussion.  Breasts: Defer to GYN Abdomen: Soft, obese abdomen, nontender, no guarding, rebound, hernias, masses, or organomegaly.  Lymphatics: Non tender without lymphadenopathy.  Genitourinary: Defer to GYN Musculoskeletal: Full ROM all peripheral extremities,5/5 strength, and normal gait.  Skin: Warm, dry without rashes, lesions, ecchymosis. Neuro: Cranial nerves intact, reflexes equal bilaterally. Normal muscle tone, no cerebellar symptoms. Sensation intact.  Psych: Awake and oriented X 3, normal affect, Insight and Judgment appropriate.   EKG: defer, baseline in chart  [place] 9:06 AM [facility]

## 2022-02-14 ENCOUNTER — Encounter: Payer: 59 | Admitting: Adult Health

## 2022-02-15 ENCOUNTER — Ambulatory Visit (INDEPENDENT_AMBULATORY_CARE_PROVIDER_SITE_OTHER): Payer: 59 | Admitting: Nurse Practitioner

## 2022-02-15 ENCOUNTER — Encounter: Payer: Self-pay | Admitting: Nurse Practitioner

## 2022-02-15 VITALS — BP 122/88 | HR 100 | Temp 97.3°F | Ht 64.5 in | Wt 236.2 lb

## 2022-02-15 DIAGNOSIS — Z9109 Other allergy status, other than to drugs and biological substances: Secondary | ICD-10-CM

## 2022-02-15 DIAGNOSIS — Z23 Encounter for immunization: Secondary | ICD-10-CM | POA: Diagnosis not present

## 2022-02-15 DIAGNOSIS — R030 Elevated blood-pressure reading, without diagnosis of hypertension: Secondary | ICD-10-CM

## 2022-02-15 DIAGNOSIS — K219 Gastro-esophageal reflux disease without esophagitis: Secondary | ICD-10-CM

## 2022-02-15 DIAGNOSIS — F411 Generalized anxiety disorder: Secondary | ICD-10-CM

## 2022-02-15 DIAGNOSIS — E559 Vitamin D deficiency, unspecified: Secondary | ICD-10-CM

## 2022-02-15 DIAGNOSIS — K580 Irritable bowel syndrome with diarrhea: Secondary | ICD-10-CM

## 2022-02-15 DIAGNOSIS — Z0001 Encounter for general adult medical examination with abnormal findings: Secondary | ICD-10-CM

## 2022-02-15 DIAGNOSIS — F1010 Alcohol abuse, uncomplicated: Secondary | ICD-10-CM

## 2022-02-15 DIAGNOSIS — R7309 Other abnormal glucose: Secondary | ICD-10-CM

## 2022-02-15 DIAGNOSIS — Z0000 Encounter for general adult medical examination without abnormal findings: Secondary | ICD-10-CM | POA: Diagnosis not present

## 2022-02-15 DIAGNOSIS — F331 Major depressive disorder, recurrent, moderate: Secondary | ICD-10-CM

## 2022-02-15 DIAGNOSIS — R7989 Other specified abnormal findings of blood chemistry: Secondary | ICD-10-CM

## 2022-02-15 DIAGNOSIS — Z1389 Encounter for screening for other disorder: Secondary | ICD-10-CM

## 2022-02-15 DIAGNOSIS — Z79899 Other long term (current) drug therapy: Secondary | ICD-10-CM

## 2022-02-15 DIAGNOSIS — Z1329 Encounter for screening for other suspected endocrine disorder: Secondary | ICD-10-CM

## 2022-02-15 DIAGNOSIS — E782 Mixed hyperlipidemia: Secondary | ICD-10-CM

## 2022-02-15 DIAGNOSIS — Z131 Encounter for screening for diabetes mellitus: Secondary | ICD-10-CM

## 2022-02-15 NOTE — Patient Instructions (Signed)
Continue generic Zyrtec- may take for several weeks and then alternate with generic Allegra  Flonase 2 sprays each nostril daily  Sudafed as needed for decongesting   Allergies, Adult An allergy is a condition in which the body's defense system (immune system) comes in contact with an allergen and reacts to it. An allergen is anything that causes an allergic reaction. Allergens cause the immune system to make proteins for fighting infections (antibodies). These antibodies cause cells to release chemicals called histamines that set off the symptoms of an allergic reaction. Allergies often affect the nasal passages (allergic rhinitis), eyes (allergic conjunctivitis), skin (atopic dermatitis), and stomach. Allergies can be mild, moderate, or severe. They cannot spread from person to person. Allergies can develop at any age and may be outgrown. What are the causes? This condition is caused by allergens. Common allergens include: Outdoor allergens, such as pollen, car fumes, and mold. Indoor allergens, such as dust, smoke, mold, and pet dander. Other allergens, such as foods, medicines, scents, insect bites or stings, and other skin irritants. What increases the risk? You are more likely to develop this condition if you have: Family members with allergies. Family members who have any condition that may be caused by allergens, such as asthma. This may make you more likely to have other allergies. What are the signs or symptoms? Symptoms of this condition depend on the severity of the allergy. Mild to moderate symptoms Runny nose, stuffy nose (nasal congestion), or sneezing. Itchy mouth, ears, or throat. A feeling of mucus dripping down the back of your throat (postnasal drip). Sore throat. Itchy, red, watery, or puffy eyes. Skin rash, or itchy, red, swollen areas of skin (hives). Stomach cramps or bloating. Severe symptoms Severe allergies to food, medicine, or insect bites may cause  anaphylaxis, which can be life-threatening. Symptoms include: A red (flushed) face. Wheezing or coughing. Swollen lips, tongue, or mouth. Tight or swollen throat. Chest pain or tightness, or rapid heartbeat. Trouble breathing or shortness of breath. Pain in the abdomen, vomiting, or diarrhea. Dizziness or fainting. How is this diagnosed? This condition is diagnosed based on your symptoms, your family and medical history, and a physical exam. You may also have tests, including: Skin tests to see how your skin reacts to allergens that may be causing your symptoms. Tests include: Skin prick test. For this test, an allergen is introduced to your body through a small opening in the skin. Intradermal skin test. For this test, a small amount of allergen is injected under the first layer of your skin. Patch test. For this test, a small amount of allergen is placed on your skin. The area is covered and then checked after a few days. Blood tests. A challenge test. For this test, you will eat or breathe in a small amount of allergen to see if you have an allergic reaction. You may also be asked to: Keep a food diary. This is a record of all the foods, drinks, and symptoms you have in a day. Try an elimination diet. To do this: Remove certain foods from your diet. Add those foods back one by one to find out if any foods cause an allergic reaction. How is this treated?     Treatment for allergies depends on your symptoms. Treatment may include: Cold, wet cloths (cold compresses) to soothe itching and swelling. Eye drops or nasal sprays. Nasal irrigation to help clear your mucus or keep the nasal passages moist. A humidifier to add moisture to the air.  Skin creams to treat rashes or itching. Oral antihistamines or other medicines to block the reaction or to treat inflammation. Diet changes to remove foods that cause allergies. Being exposed again and again to tiny amounts of allergens to help you  build a defense against it (tolerance). This is called immunotherapy. Examples include: Allergy shot. You receive an injection that contains an allergen. Sublingual immunotherapy. You take a small dose of allergen under your tongue. Emergency injection for anaphylaxis. You give yourself a shot using a syringe (auto-injector) that contains the amount of medicine you need. Your health care provider will teach you how to give yourself an injection. Follow these instructions at home: Medicines  Take or apply over-the-counter and prescription medicines only as told by your health care provider. Always carry your auto-injector pen if you are at risk of anaphylaxis. Give yourself an injection as told by your health care provider. Eating and drinking Follow instructions from your health care provider about eating or drinking restrictions. Drink enough fluid to keep your urine pale yellow. General instructions Wear a medical alert bracelet or necklace to let others know that you have had anaphylaxis before. Avoid known allergens whenever possible. Keep all follow-up visits as told by your health care provider. This is important. Contact a health care provider if: Your symptoms do not get better with treatment. Get help right away if: You have symptoms of anaphylaxis. These include: Swollen mouth, tongue, or throat. Pain or tightness in your chest. Trouble breathing or shortness of breath. Dizziness or fainting. Severe abdominal pain, vomiting, or diarrhea. These symptoms may represent a serious problem that is an emergency. Do not wait to see if the symptoms will go away. Get medical help right away. Call your local emergency services (911 in the [place] Do not drive yourself to the hospital. Summary Take or apply over-the-counter and prescription medicines only as told by your health care provider. Avoid known allergens when possible. Always carry your auto-injector pen if you are at risk of  anaphylaxis. Give yourself an injection as told by your health care provider. Wear a medical alert bracelet or necklace to let others know that you have had anaphylaxis before. Anaphylaxis is a life-threatening emergency. Get help right away. This information is not intended to replace advice given to you by your health care provider. Make sure you discuss any questions you have with your health care provider. Document Revised: 11/22/2019 Document Reviewed: 02/03/2019 Elsevier Patient Education  [place].

## 2022-02-16 LAB — COMPLETE METABOLIC PANEL WITH GFR
AG Ratio: 1.6 (calc) (ref 1.0–2.5)
ALT: 17 U/L (ref 6–29)
AST: 22 U/L (ref 10–30)
Albumin: 4.4 g/dL (ref 3.6–5.1)
Alkaline phosphatase (APISO): 72 U/L (ref 31–125)
BUN: 8 mg/dL (ref 7–25)
CO2: 25 mmol/L (ref 20–32)
Calcium: 9.3 mg/dL (ref 8.6–10.2)
Chloride: 102 mmol/L (ref 98–110)
Creat: 0.81 mg/dL (ref 0.50–0.97)
Globulin: 2.8 g/dL (calc) (ref 1.9–3.7)
Glucose, Bld: 84 mg/dL (ref 65–99)
Potassium: 4 mmol/L (ref 3.5–5.3)
Sodium: 138 mmol/L (ref 135–146)
Total Bilirubin: 0.5 mg/dL (ref 0.2–1.2)
Total Protein: 7.2 g/dL (ref 6.1–8.1)
eGFR: 96 mL/min/{1.73_m2} (ref >=60)

## 2022-02-16 LAB — CBC WITH DIFFERENTIAL/PLATELET
Absolute Monocytes: 475 cells/uL (ref 200–950)
Basophils Absolute: 29 cells/uL (ref 0–200)
Basophils Relative: 0.4 %
Eosinophils Absolute: 183 cells/uL (ref 15–500)
Eosinophils Relative: 2.5 %
HCT: 39.9 % (ref 35.0–45.0)
Hemoglobin: 13.3 g/dL (ref 11.7–15.5)
Lymphs Abs: 2059 cells/uL (ref 850–3900)
MCH: 29.9 pg (ref 27.0–33.0)
MCHC: 33.3 g/dL (ref 32.0–36.0)
MCV: 89.7 fL (ref 80.0–100.0)
MPV: 10 fL (ref 7.5–12.5)
Monocytes Relative: 6.5 %
Neutro Abs: 4555 cells/uL (ref 1500–7800)
Neutrophils Relative %: 62.4 %
Platelets: 309 10*3/uL (ref 140–400)
RBC: 4.45 10*6/uL (ref 3.80–5.10)
RDW: 13.2 % (ref 11.0–15.0)
Total Lymphocyte: 28.2 %
WBC: 7.3 10*3/uL (ref 3.8–10.8)

## 2022-02-16 LAB — VITAMIN D 25 HYDROXY (VIT D DEFICIENCY, FRACTURES): Vit D, 25-Hydroxy: 21 ng/mL — ABNORMAL LOW (ref 30–100)

## 2022-02-16 LAB — HEMOGLOBIN A1C
Hgb A1c MFr Bld: 5.2 % of total Hgb (ref <5.7)
Mean Plasma Glucose: 103 mg/dL
eAG (mmol/L): 5.7 mmol/L

## 2022-02-16 LAB — URINALYSIS, ROUTINE W REFLEX MICROSCOPIC
Bacteria, UA: NONE SEEN /HPF
Bilirubin Urine: NEGATIVE
Glucose, UA: NEGATIVE
Hgb urine dipstick: NEGATIVE
Hyaline Cast: NONE SEEN /LPF
Ketones, ur: NEGATIVE
Nitrite: NEGATIVE
Specific Gravity, Urine: 1.026 (ref 1.001–1.035)
pH: 6 (ref 5.0–8.0)

## 2022-02-16 LAB — LIPID PANEL
Cholesterol: 227 mg/dL — ABNORMAL HIGH (ref <200)
HDL: 77 mg/dL (ref >=50)
LDL Cholesterol (Calc): 113 mg/dL (calc) — ABNORMAL HIGH
Non-HDL Cholesterol (Calc): 150 mg/dL (calc) — ABNORMAL HIGH (ref <130)
Total CHOL/HDL Ratio: 2.9 (calc) (ref <5.0)
Triglycerides: 257 mg/dL — ABNORMAL HIGH (ref <150)

## 2022-02-16 LAB — MAGNESIUM: Magnesium: 1.6 mg/dL (ref 1.5–2.5)

## 2022-02-16 LAB — TSH: TSH: 3.32 mIU/L

## 2022-04-05 ENCOUNTER — Encounter: Payer: Self-pay | Admitting: Nurse Practitioner

## 2022-04-05 ENCOUNTER — Ambulatory Visit: Payer: 59 | Admitting: Nurse Practitioner

## 2022-04-05 VITALS — Temp 96.3°F

## 2022-04-05 DIAGNOSIS — U071 COVID-19: Secondary | ICD-10-CM

## 2022-04-05 MED ORDER — DEXAMETHASONE 1 MG PO TABS: ORAL_TABLET | ORAL | 0 refills | Status: DC

## 2022-04-05 MED ORDER — PROMETHAZINE-DM 6.25-15 MG/5ML PO SYRP: 5.0000 mL | ORAL_SOLUTION | Freq: Four times a day (QID) | ORAL | 1 refills | Status: DC | PRN

## 2022-04-05 MED ORDER — ALBUTEROL SULFATE HFA 108 (90 BASE) MCG/ACT IN AERS: 2.0000 | INHALATION_SPRAY | RESPIRATORY_TRACT | 0 refills | Status: DC | PRN

## 2022-04-05 MED ORDER — BENZONATATE 100 MG PO CAPS: 100.0000 mg | ORAL_CAPSULE | Freq: Four times a day (QID) | ORAL | 1 refills | Status: DC | PRN

## 2022-04-05 NOTE — Patient Instructions (Signed)
Covid 19 Immue support reviewed - Vit C, Vit D, Zinc Take tylenol PRN temp 101+ Push hydration Regular ambulation or calf exercises exercises for clot prevention and 81 mg ASA unless contraindicated Sx supportive therapy suggested Follow up via mychart or telephone if needed Advised patient obtain O2 monitor; present to ED if persistently <90% or with severe dyspnea, CP, fever uncontrolled by tylenol, confusion, sudden decline Should remain in isolation 5 days from testing positive and then wear a mask when around other people for the following 5 days

## 2022-04-05 NOTE — Progress Notes (Signed)
THIS ENCOUNTER IS A VIRTUAL VISIT DUE TO COVID-19 - PATIENT WAS NOT SEEN IN THE OFFICE.  PATIENT HAS CONSENTED TO VIRTUAL VISIT / TELEMEDICINE VISIT   Virtual Visit via telephone Note  I connected with  Angela Dixon on 04/05/2022 by telephone.  I verified that I am speaking with the correct person using two identifiers.    I discussed the limitations of evaluation and management by telemedicine and the availability of in person appointments. The patient expressed understanding and agreed to proceed.  History of Present Illness:  Temp (!) 96.3 F (35.7 C)  36 y.o. patient contacted office reporting URI sx . she tested positive by home test. OV was conducted by telephone to minimize exposure. This patient was vaccinated for covid 19, last 01/27/21     Sx began 2 days ago with wheezing, dry cough, congestion, sore throat, headache, fatigue and body aches.   Treatments tried so far: Tylenol , motrin nasal decongestant  Exposures: family   Medications    Current Outpatient Medications (Respiratory):    albuterol (PROVENTIL HFA;VENTOLIN HFA) 108 (90 Base) MCG/ACT inhaler, Inhale 2 puffs into the lungs every 4 hours as needed for wheezing or shortness of breath. (Patient taking differently: Inhale 2 puffs into the lungs every 4 (four) hours as needed for wheezing or shortness of breath.)   cetirizine (ZYRTEC) 10 MG tablet, Take 10 mg by mouth daily.   Current Outpatient Medications (Hematological):    Cyanocobalamin (B-12 PO), Take 1 tablet by mouth daily.   folic acid (FOLVITE) 1 MG tablet, Take 1 mg by mouth daily.  Current Outpatient Medications (Other):    amphetamine-dextroamphetamine (ADDERALL XR) 30 MG 24 hr capsule, TAKE 1 CAPSULE BY MOUTH DAILY HAVING TO CHANGE ONLY BECAUSE OF NATIONAL SHORTAGE   Cholecalciferol 125 MCG (5000 UT) capsule, Take 2 capsules (10,000 Units total) by mouth daily.   Flaxseed, Linseed, (FLAX SEED OIL) 1000 MG CAPS, Take 1,000 mg by mouth daily.     FLUoxetine (PROZAC) 20 MG capsule, Take  1 capsule  Daily  for Mood   Multiple Vitamin (MULTIVITAMIN WITH MINERALS) TABS tablet, Take 1 tablet by mouth daily.   pantoprazole (PROTONIX) 40 MG tablet, Take 1 tablet (40 mg total) by mouth daily.  Allergies:  Allergies  Allergen Reactions   Wellbutrin [Bupropion] Hives   Zoloft [Sertraline] Other (See Comments)    Excessive sweating, insomnia   Neomycin Rash    Problem list She has Vitamin D deficiency; Depression; Generalized anxiety disorder; Medication management; GERD (gastroesophageal reflux disease); Irritable bowel syndrome (IBS); Excessive drinking of alcohol; Stress and adjustment reaction; Morbid obesity with BMI of 40.0-44.9, adult ([place]); Elevated BP without diagnosis of hypertension; ADHD; Mixed hyperlipidemia; B12 deficiency; ASCUS of cervix with negative high risk HPV; and LFT elevation on their problem list.   Social History:   reports that she quit smoking about 15 years ago. Her smoking use included cigarettes. She smoked an average of 1 pack per day. She has never used smokeless tobacco. She reports current alcohol use of about 2.0 - 3.0 standard drinks of alcohol per week. She reports that she does not currently use drugs after having used the following drugs: Marijuana.  Observations/Objective:  General : Well sounding patient in no apparent distress HEENT: no hoarseness, no cough for duration of visit Lungs: speaks in complete sentences, no audible wheezing, no apparent distress Neurological: alert, oriented x 3 Psychiatric: pleasant, judgement appropriate   Assessment and Plan:  Covid 19 Covid 19 positive per rapid  screening test at home Risk factors include: Morbid Obesity Symptoms are: mild Immue support reviewed - Vit C, Vit D, Zinc Take tylenol PRN temp 101+ Push hydration Regular ambulation or calf exercises exercises for clot prevention and 81 mg ASA unless contraindicated Sx supportive therapy  suggested Follow up via mychart or telephone if needed Advised patient obtain O2 monitor; present to ED if persistently <90% or with severe dyspnea, CP, fever uncontrolled by tylenol, confusion, sudden decline Should remain in isolation 5 days from testing positive and then wear a mask when around other people for the following 5 days   COVID-19 -     dexamethasone (DECADRON) 1 MG tablet; Take 3 tabs for 3 days, 2 tabs for 3 days 1 tab for 5 days. Take with food. -     promethazine-dextromethorphan (PROMETHAZINE-DM) 6.25-15 MG/5ML syrup; Take 5 mLs by mouth 4 (four) times daily as needed for cough. -     benzonatate (TESSALON PERLES) 100 MG capsule; Take 1 capsule (100 mg total) by mouth every 6 (six) hours as needed for cough. -     albuterol (VENTOLIN HFA) 108 (90 Base) MCG/ACT inhaler; Inhale 2 puffs into the lungs every 4 (four) hours as needed for wheezing or shortness of breath.     Follow Up Instructions:  I discussed the assessment and treatment plan with the patient. The patient was provided an opportunity to ask questions and all were answered. The patient agreed with the plan and demonstrated an understanding of the instructions.   The patient was advised to call back or seek an in-person evaluation if the symptoms worsen or if the condition fails to improve as anticipated.  I provided 20 minutes of non-face-to-face time during this encounter.   Alycia Rossetti, NP

## 2022-04-18 ENCOUNTER — Telehealth: Payer: 59 | Admitting: Emergency Medicine

## 2022-04-18 DIAGNOSIS — J029 Acute pharyngitis, unspecified: Secondary | ICD-10-CM | POA: Diagnosis not present

## 2022-04-18 MED ORDER — AMOXICILLIN 500 MG PO CAPS: 500.0000 mg | ORAL_CAPSULE | Freq: Two times a day (BID) | ORAL | 0 refills | Status: AC

## 2022-04-18 NOTE — Progress Notes (Signed)
E-Visit for Sore Throat - Strep Symptoms ? ?We are sorry that you are not feeling well.  Here is how we plan to help! ? ?Based on what you have shared with me it is likely that you have strep pharyngitis.  Strep pharyngitis is inflammation and infection in the back of the throat.  This is an infection cause by bacteria and is treated with antibiotics.  I have prescribed Amoxicillin 500 mg twice a day for 10 days. For throat pain, we recommend over the counter oral pain relief medications such as acetaminophen or aspirin, or anti-inflammatory medications such as ibuprofen or naproxen sodium. Topical treatments such as oral throat lozenges or sprays may be used as needed. Strep infections are not as easily transmitted as other respiratory infections, however we still recommend that you avoid close contact with loved ones, especially the very young and elderly.  Remember to wash your hands thoroughly throughout the day as this is the number one way to prevent the spread of infection and wipe down door knobs and counters with disinfectant. ? ? ?Home Care: ?Only take medications as instructed by your medical team. ?Complete the entire course of an antibiotic. ?Do not take these medications with alcohol. ?A steam or ultrasonic humidifier can help congestion.  You can place a towel over your head and breathe in the steam from hot water coming from a faucet. ?Avoid close contacts especially the very young and the elderly. ?Cover your mouth when you cough or sneeze. ?Always remember to wash your hands. ? ?Get Help Right Away If: ?You develop worsening fever or sinus pain. ?You develop a severe head ache or visual changes. ?Your symptoms persist after you have completed your treatment plan. ? ?Make sure you ?Understand these instructions. ?Will watch your condition. ?Will get help right away if you are not doing well or get worse. ? ? ?Thank you for choosing an e-visit. ? ?Your e-visit answers were reviewed by a board  certified advanced clinical practitioner to complete your personal care plan. Depending upon the condition, your plan could have included both over the counter or prescription medications. ? ?Please review your pharmacy choice. Make sure the pharmacy is open so you can pick up prescription now. If there is a problem, you may contact your provider through MyChart messaging and have the prescription routed to another pharmacy.  Your safety is important to us. If you have drug allergies check your prescription carefully.  ? ?For the next 24 hours you can use MyChart to ask questions about today's visit, request a non-urgent call back, or ask for a work or school excuse. ?You will get an email in the next two days asking about your experience. I hope that your e-visit has been valuable and will speed your recovery. ? ?I have spent 5 minutes in review of e-visit questionnaire, review and updating patient chart, medical decision making and response to patient.  ? ?Raad Clayson, PhD, FNP-BC ?  ?

## 2022-06-26 ENCOUNTER — Other Ambulatory Visit: Payer: Self-pay | Admitting: Internal Medicine

## 2022-09-18 ENCOUNTER — Other Ambulatory Visit: Payer: Self-pay | Admitting: Physician Assistant

## 2023-01-03 ENCOUNTER — Telehealth: Payer: Self-pay | Admitting: Physician Assistant

## 2023-01-03 ENCOUNTER — Telehealth (INDEPENDENT_AMBULATORY_CARE_PROVIDER_SITE_OTHER): Payer: 59 | Admitting: Physician Assistant

## 2023-01-03 ENCOUNTER — Encounter: Payer: Self-pay | Admitting: Physician Assistant

## 2023-01-03 VITALS — Wt 215.0 lb

## 2023-01-03 DIAGNOSIS — K219 Gastro-esophageal reflux disease without esophagitis: Secondary | ICD-10-CM | POA: Diagnosis not present

## 2023-01-03 DIAGNOSIS — K580 Irritable bowel syndrome with diarrhea: Secondary | ICD-10-CM | POA: Diagnosis not present

## 2023-01-03 MED ORDER — PANTOPRAZOLE SODIUM 40 MG PO TBEC: 40.0000 mg | DELAYED_RELEASE_TABLET | Freq: Two times a day (BID) | ORAL | 0 refills | Status: DC

## 2023-01-03 MED ORDER — PANTOPRAZOLE SODIUM 40 MG PO TBEC: 40.0000 mg | DELAYED_RELEASE_TABLET | Freq: Every day | ORAL | 3 refills | Status: DC

## 2023-01-03 NOTE — Telephone Encounter (Signed)
Inbound call from patient requesting a virtual visit due for today appointment at 11:30 am due to weather. Patient requesting a call back. Please advise, thank you.

## 2023-01-03 NOTE — Telephone Encounter (Signed)
[name], Angela Dixon are scheduled for a virtual visit.  Just as we do with appointments in the office, we must obtain your consent to participate.  Your consent will be active for this visit and any virtual visit you may have with one of our providers in the next 365 days.    If you have a MyChart account, I can also send a copy of this consent to you electronically.  All virtual visits are billed to your insurance company just like a traditional visit in the office.  As this is a virtual visit, video technology does not allow for your provider to perform a traditional examination.  This may limit your provider's ability to fully assess your condition.  If your provider identifies any concerns that need to be evaluated in person or the need to arrange testing such as labs, EKG, etc, we will make arrangements to do so.    Although advances in technology are sophisticated, we cannot ensure that it will always work on either your end or our end.  If the connection with a video visit is poor, we may have to switch to a telephone visit.  With either a video or telephone visit, we are not always able to ensure that we have a secure connection.   I need to obtain your verbal consent now.    Are you willing to proceed with your visit today?  Response: Yes  Doree Albee, PA-C 01/03/2023  11:20 AM

## 2023-01-03 NOTE — Progress Notes (Signed)
01/03/2023 Angela Dixon [id number] [birth year]  This service was provided via telemedicine with audio/visual communication. The patient was located at home The provider was located in provider's GI office. The patient did consent to this telephone visit and is aware of possible charges through their insurance for this visit.   The persons participating in this telemedicine service were the patient and I. Time spent on call:   Referring provider: Lucky Cowboy, MD Primary GI doctor: Dr. Russella Dar  ASSESSMENT AND PLAN:   Gastroesophageal reflux disease, unspecified whether esophagitis present Lifestyle changes discussed, avoid NSAIDS, ETOH Weight loss discussed with the patient Will refill protonix 40 mg for a year Follow up 1 year  Irritable bowel syndrome with diarrhea Improved with decrease stress Can do trial of IBGARD as needed Add on citracel/benefiber FODMAP,  and lifestyle changes discussed Consider SIBO testing or xifaxin trial pending results Call the office if any alarming symptoms  History of Present Illness:  37 y.o. female  with a past medical history of anxiety, depression, IBS and others listed below, is being evaluated via telephone for   03/25/2019 EGD with LA grade B reflux esophagitis, erythematous mucosa in the gastric body and antrum and small hiatal hernia. Pathology showed mild chronic gastritis.  07/09/2020 patient seen in ED for nausea and vomiting as well as flank pain.  At that time had been using marijuana.  Urinalysis with moderate leukocytes.  CBC with a white count of 15.9.  CMP with creatinine 1.37.  CT renal stone study showed no explanation for patient's right-sided flank pain.  Patient was given IV fluids and nausea had resolved after Reglan and Benadryl she was sent home with Phenergan suppositories and Keflex for UTI.  09/21/2020 office visit with Hyacinth Meeker, PA having increased symptoms of reflux so she was drinking more alcohol.  Protonix 40  mg daily some breakthrough symptoms once a month.  Patient has IBS and diarrhea 2-3 loose stools daily occasionally needing Imodium hyoscyamine for abdominal cramping.  Patient had office visit scheduled 9/27 but due to weather and the circumstances scheduled telephone visit. She states she ran out of pantoprazole and has been on nexium for several weeks 5-6 weeks, having some breaks through symptoms once a week, worse if forget to take medication, worse after eating. She has had some weight loss but trying to work on it and also started adderral for ADHD Rare NSAIDS.  Red wine 2-3 glasses 2-3 night during a week.  She does have formed stools, occ loose stools but better than before. States levsin has not helped in the past.   Denies fever, chills, blood in her stools.   RELEVANT LABS AND IMAGING: CBC    Component Value Date/Time   WBC 7.3 02/15/2022 0000   RBC 4.45 02/15/2022 0000   HGB 13.3 02/15/2022 0000   HCT 39.9 02/15/2022 0000   PLT 309 02/15/2022 0000   MCV 89.7 02/15/2022 0000   MCH 29.9 02/15/2022 0000   MCHC 33.3 02/15/2022 0000   RDW 13.2 02/15/2022 0000   LYMPHSABS 2,059 02/15/2022 0000   MONOABS 1.2 (H) 07/09/2020 0835   EOSABS 183 02/15/2022 0000   BASOSABS 29 02/15/2022 0000   Recent Labs    02/15/22 0000  HGB 13.3     CMP     Component Value Date/Time   NA 138 02/15/2022 0000   K 4.0 02/15/2022 0000   CL 102 02/15/2022 0000   CO2 25 02/15/2022 0000   GLUCOSE 84 02/15/2022 0000  BUN 8 02/15/2022 0000   CREATININE 0.81 02/15/2022 0000   CALCIUM 9.3 02/15/2022 0000   PROT 7.2 02/15/2022 0000   ALBUMIN 4.3 07/09/2020 0835   AST 22 02/15/2022 0000   ALT 17 02/15/2022 0000   ALKPHOS 63 07/09/2020 0835   BILITOT 0.5 02/15/2022 0000   GFRNONAA 52 (L) 07/09/2020 0835   GFRNONAA 114 01/27/2020 1054   GFRAA 133 01/27/2020 1054      Latest Ref Rng & Units 02/15/2022   12:00 AM 11/19/2021    8:58 AM 10/16/2021    3:00 PM  Hepatic Function  Total  Protein 6.1 - 8.1 g/dL 7.2  7.0  7.3   AST 10 - 30 U/L 22  14  43   ALT 6 - 29 U/L 17  13  26    Total Bilirubin 0.2 - 1.2 mg/dL 0.5  0.4  0.4   Bilirubin, Direct 0.0 - 0.2 mg/dL  0.1        She  reports that she quit smoking about 16 years ago. Her smoking use included cigarettes. She has never used smokeless tobacco. She reports current alcohol use of about 2.0 - 3.0 standard drinks of alcohol per week. She reports that she does not currently use drugs after having used the following drugs: Marijuana. Her family history includes Asthma in her sister; Cancer in her paternal grandfather and paternal grandmother; Diabetes in her maternal grandmother; Diverticulitis in her father; Heart disease in her maternal grandfather; Hyperlipidemia in her mother; Hypertension in her maternal grandmother; Osteopenia in her mother.  Current Medications:   Current Outpatient Medications (Endocrine & Metabolic):    levonorgestrel (MIRENA, 52 MG,) 20 MCG/DAY IUD, 1 each by Intrauterine route once.   Current Outpatient Medications (Respiratory):    albuterol (VENTOLIN HFA) 108 (90 Base) MCG/ACT inhaler, Inhale 2 puffs into the lungs every 4 (four) hours as needed for wheezing or shortness of breath.   benzonatate (TESSALON PERLES) 100 MG capsule, Take 1 capsule (100 mg total) by mouth every 6 (six) hours as needed for cough.   cetirizine (ZYRTEC) 10 MG tablet, Take 10 mg by mouth daily.   promethazine-dextromethorphan (PROMETHAZINE-DM) 6.25-15 MG/5ML syrup, Take 5 mLs by mouth 4 (four) times daily as needed for cough.  Current Outpatient Medications (Analgesics):    Acetaminophen (TYLENOL PO), Take by mouth.   ibuprofen (ADVIL) 200 MG tablet, Take 200 mg by mouth every 6 (six) hours as needed.  Current Outpatient Medications (Hematological):    Cyanocobalamin (B-12 PO), Take 1 tablet by mouth daily.   folic acid (FOLVITE) 1 MG tablet, Take 1 mg by mouth daily.  Current Outpatient Medications (Other):     amphetamine-dextroamphetamine (ADDERALL XR) 30 MG 24 hr capsule, TAKE 1 CAPSULE BY MOUTH DAILY HAVING TO CHANGE ONLY BECAUSE OF NATIONAL SHORTAGE   Cholecalciferol 125 MCG (5000 UT) capsule, Take 2 capsules (10,000 Units total) by mouth daily.   Flaxseed, Linseed, (FLAX SEED OIL) 1000 MG CAPS, Take 1,000 mg by mouth daily.    FLUoxetine (PROZAC) 20 MG capsule, TAKE 1 CAPSULE DAILY FOR MOOD   Multiple Vitamin (MULTIVITAMIN WITH MINERALS) TABS tablet, Take 1 tablet by mouth daily.   pantoprazole (PROTONIX) 40 MG tablet, Take 1 tablet (40 mg total) by mouth 2 (two) times daily before a meal.   pantoprazole (PROTONIX) 40 MG tablet, Take 1 tablet (40 mg total) by mouth daily.  Surgical History:  She  has a past surgical history that includes Wisdom tooth extraction and Colposcopy (04/08/2004).  Current  Medications, Allergies, Past Medical History, Past Surgical History, Family History and Social History were reviewed in [employer] record.  Physical Exam: Wt 215 lb (97.5 kg)   BMI 36.33 kg/m  General Appearance:Well sounding, in no apparent distress.  ENT/Mouth: No hoarseness, No cough for duration of visit.  Respiratory: completing full sentences without distress, without audible wheeze Neuro: Awake and oriented X 3,  Psych:  Insight and Judgment appropriate.    Doree Albee, PA-C 01/03/23  I verified that I am speaking with the correct person using two identifiers.  I discussed the limitations, risks, security and privacy concerns of performing an evaluation and management service by telephone and the availability of in person appointments. I also discussed with the patient that there may be a charge for the services provided today billed to their insurance including co-pays and deductible . The patient expressed understanding and agreed to proceed.

## 2023-01-03 NOTE — Patient Instructions (Signed)
Please take your proton pump inhibitor medication, protonix  Please take this medication 30 minutes to 1 hour before meals- this makes it more effective.  Can get pepcid over the counter for breath through pain Avoid spicy and acidic foods Avoid fatty foods Limit your intake of coffee, tea, alcohol, and carbonated drinks Work to maintain a healthy weight Keep the head of the bed elevated at least 3 inches with blocks or a wedge pillow if you are having any nighttime symptoms Stay upright for 2 hours after eating Avoid meals and snacks three to four hours before bedtime   First do a trial off milk/lactose products if you use them.  Add fiber like benefiber or citracel once a day Increase activity Can do trial of IBGard which is over the counter for AB pain- Take 1-2 capsules once a day for maintence or twice a day during a flare  if any worsening symptoms like blood in stool, weight loss, please call the office     FODMAP stands for fermentable oligo-, di-, mono-saccharides and polyols (1). These are the scientific terms used to classify groups of carbs that are difficult for our body to digest and that are notorious for triggering digestive symptoms like bloating, gas, loose stools and stomach pain.   You can try low FODMAP diet  - start with eliminating just one column at a time that you feel may be a trigger for you. - the table at the very bottom contains foods that are low in FODMAPs   Sometimes trying to eliminate the FODMAP's from your diet is difficult or tricky, if you are stuggling with trying to do the elimination diet you can try an enzyme.  There is a food enzymes that you sprinkle in or on your food that helps break down the FODMAP. You can read more about the enzyme by going to this site: https://fodzyme.com/

## 2023-01-29 ENCOUNTER — Ambulatory Visit (INDEPENDENT_AMBULATORY_CARE_PROVIDER_SITE_OTHER): Payer: 59 | Admitting: Nurse Practitioner

## 2023-01-29 ENCOUNTER — Encounter: Payer: Self-pay | Admitting: Nurse Practitioner

## 2023-01-29 VITALS — BP 120/82 | HR 93 | Temp 97.8°F | Ht 64.5 in | Wt 208.4 lb

## 2023-01-29 DIAGNOSIS — F4329 Adjustment disorder with other symptoms: Secondary | ICD-10-CM

## 2023-01-29 DIAGNOSIS — Z79899 Other long term (current) drug therapy: Secondary | ICD-10-CM

## 2023-01-29 DIAGNOSIS — F331 Major depressive disorder, recurrent, moderate: Secondary | ICD-10-CM | POA: Diagnosis not present

## 2023-01-29 DIAGNOSIS — F411 Generalized anxiety disorder: Secondary | ICD-10-CM | POA: Diagnosis not present

## 2023-01-29 MED ORDER — BUSPIRONE HCL 5 MG PO TABS: ORAL_TABLET | ORAL | 0 refills | Status: DC

## 2023-01-29 NOTE — Progress Notes (Signed)
Assessment and Plan:  Angela Dixon was seen today for a episodic visit.  Diagnoses and all order for this visit:  Moderate episode of recurrent major depressive disorder (HCC) Increase Fluoxetine to 20 mg BID Add Buspar  Discussed secondary to new and recent placement of hormonal IUD 12/23/22 - Continue to monitor Continue Cognitive Behavioral Therapy CBT. F/U 3 months for review of medication effectiveness  Generalized anxiety disorder Add Buspar  Reviewed relaxation techniques.  Sleep hygiene. Again continue Cognitive Behavioral Therapy (CBT). Recommended mindfulness meditation and exercise.   Encouraged personality growth wand development through coping techniques and problem-solving skills. Limit/Decrease/Monitor drug/alcohol intake.    - busPIRone (BUSPAR) 5 MG tablet; Take     1/2 to 1 tablet     3 x /day      for Anxiety  Dispense: 90 tablet; Refill: 0  Stress and adjustment reaction Continue to look for new job as currently seeking Continue to monitor  Medication management All medications discussed and reviewed in full. All questions and concerns regarding medications addressed.    - busPIRone (BUSPAR) 5 MG tablet; Take     1/2 to 1 tablet     3 x /day      for Anxiety  Dispense: 90 tablet; Refill: 0   Notify office for further evaluation and treatment, questions or concerns if s/s fail to improve. The risks and benefits of my recommendations, as well as other treatment options were discussed with the patient today. Questions were answered.  Further disposition pending results of labs. Discussed med's effects and SE's.    Over 25 minutes of exam, counseling, chart review, and critical decision making was performed.   Future Appointments  Date Time Provider Department Center  02/19/2023  9:00 AM Angela Dick, NP [facility] None    ------------------------------------------------------------------------------------------------------------------   HPI BP  120/82   Pulse 93   Temp 97.8 F (36.6 C)   Ht 5' 4.5" (1.638 m)   Wt 208 lb 6.4 oz (94.5 kg)   SpO2 99%   BMI 35.22 kg/m   37 y.o.female presents for evaluation of medication management for tmt of anxiety and depression.  States that over the past several weeks she has noticed increase in irritability, crying, episodes of moderate depression - denies HI/SI, feeling overwhelmed and stressed.    She is a single mom, divorced from her husband for 4 years, has a good co-parenting relationship but continues to have stress with the overall issues.  She has two children 59 and 40 yo.  Her mother is battling cancer.  She feels very overwhelmed at work.  She has been leaving early due to not being able to control her emotions/feeling overwhelmed.  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  Her recent work evaluation returned negative which triggered her overall symptoms. She is currently looking for a new job.  Of note, she had new IUD re-placed 12/23/22 (previous was expiring), feels as though this may be contributing to her fluctuation in psychological symptoms.  She had post partum depression.    She has been taking fluoxetine 20 mg daily for the past several years - no recent adjustments.  She has tried Wellbutrin in the past along with Zoloft but Wellbutrin caused a late reaction of swelling and hives.  Zoloft induces SE of excessive sweating and insomnia.  She does take Adderall for ADHD, currently prescribed by Angela Dixon.   She has started online CBT and  feels overall this is helpful.   Past Medical History:  Diagnosis Date   Abnormal Pap smear    Acid reflux    Allergy    Anemia    Anxiety    Cannabinoid hyperemesis syndrome 02/28/2019   Chlamydia 2006   Depression    Fracture    Hypercholesterolemia    IBS (irritable bowel syndrome)    Vitamin D deficiency      Allergies  Allergen Reactions   Wellbutrin [Bupropion] Hives    Zoloft [Sertraline] Other (See Comments)    Excessive sweating, insomnia   Neomycin Rash    Current Outpatient Medications on File Prior to Visit  Medication Sig   Acetaminophen (TYLENOL PO) Take by mouth.   albuterol (VENTOLIN HFA) 108 (90 Base) MCG/ACT inhaler Inhale 2 puffs into the lungs every 4 (four) hours as needed for wheezing or shortness of breath.   amphetamine-dextroamphetamine (ADDERALL XR) 30 MG 24 hr capsule TAKE 1 CAPSULE BY MOUTH DAILY HAVING TO CHANGE ONLY BECAUSE OF NATIONAL SHORTAGE   cetirizine (ZYRTEC) 10 MG tablet Take 10 mg by mouth daily.   Cholecalciferol 125 MCG (5000 UT) capsule Take 2 capsules (10,000 Units total) by mouth daily.   Cyanocobalamin (B-12 PO) Take 1 tablet by mouth daily.   Flaxseed, Linseed, (FLAX SEED OIL) 1000 MG CAPS Take 1,000 mg by mouth daily.    FLUoxetine (PROZAC) 20 MG capsule TAKE 1 CAPSULE DAILY FOR MOOD   folic acid (FOLVITE) 1 MG tablet Take 1 mg by mouth daily.   ibuprofen (ADVIL) 200 MG tablet Take 200 mg by mouth every 6 (six) hours as needed.   levonorgestrel (MIRENA, 52 MG,) 20 MCG/DAY IUD 1 each by Intrauterine route once.   Multiple Vitamin (MULTIVITAMIN WITH MINERALS) TABS tablet Take 1 tablet by mouth daily.   pantoprazole (PROTONIX) 40 MG tablet Take 1 tablet (40 mg total) by mouth daily.   benzonatate (TESSALON PERLES) 100 MG capsule Take 1 capsule (100 mg total) by mouth every 6 (six) hours as needed for cough.   pantoprazole (PROTONIX) 40 MG tablet Take 1 tablet (40 mg total) by mouth 2 (two) times daily before a meal.   promethazine-dextromethorphan (PROMETHAZINE-DM) 6.25-15 MG/5ML syrup Take 5 mLs by mouth 4 (four) times daily as needed for cough.   No current facility-administered medications on file prior to visit.    ROS: all negative except what is noted in the HPI.   Physical Exam:  BP 120/82   Pulse 93   Temp 97.8 F (36.6 C)   Ht 5' 4.5" (1.638 m)   Wt 208 lb 6.4 oz (94.5 kg)   SpO2 99%   BMI 35.22  kg/m   General Appearance: NAD.  Awake, conversant and cooperative. Eyes: PERRLA, EOMs intact.  Sclera white.  Conjunctiva without erythema. Sinuses: No frontal/maxillary tenderness.  No nasal discharge. Nares patent.  ENT/Mouth: Ext aud canals clear.  Bilateral TMs w/DOL and without erythema or bulging. Hearing intact.  Posterior pharynx without swelling or exudate.  Tonsils without swelling or erythema.  Neck: Supple.  No masses, nodules or thyromegaly. Respiratory: Effort is regular with non-labored breathing. Breath sounds are equal bilaterally without rales, rhonchi, wheezing or stridor.  Cardio: RRR with no MRGs. Brisk peripheral pulses without edema.  Abdomen: Active BS in all four quadrants.  Soft and non-tender without guarding, rebound tenderness, hernias or masses. Lymphatics: Non tender without lymphadenopathy.  Musculoskeletal: Full ROM, 5/5 strength, normal ambulation.  No clubbing or cyanosis. Skin: Appropriate color for ethnicity.  Warm without rashes, lesions, ecchymosis, ulcers.  Neuro: CN II-XII grossly normal. Normal muscle tone without cerebellar symptoms and intact sensation.   Psych: AO X 3,  appropriate mood and affect, insight and judgment.     Adela Glimpse, NP 11:44 AM [facility]

## 2023-01-29 NOTE — Patient Instructions (Signed)

## 2023-02-01 ENCOUNTER — Other Ambulatory Visit: Payer: Self-pay | Admitting: Physician Assistant

## 2023-02-05 ENCOUNTER — Other Ambulatory Visit: Payer: Self-pay | Admitting: Medical Genetics

## 2023-02-05 DIAGNOSIS — Z006 Encounter for examination for normal comparison and control in clinical research program: Secondary | ICD-10-CM

## 2023-02-07 ENCOUNTER — Other Ambulatory Visit (HOSPITAL_COMMUNITY)
Admission: RE | Admit: 2023-02-07 | Discharge: 2023-02-07 | Disposition: A | Discharge status: Home / Self Care | Payer: Self-pay | Source: Ambulatory Visit | Attending: Oncology | Admitting: Oncology

## 2023-02-07 DIAGNOSIS — Z006 Encounter for examination for normal comparison and control in clinical research program: Secondary | ICD-10-CM | POA: Insufficient documentation

## 2023-02-19 ENCOUNTER — Encounter: Payer: 59 | Admitting: Nurse Practitioner

## 2023-02-24 ENCOUNTER — Encounter: Payer: Self-pay | Admitting: Nurse Practitioner

## 2023-02-24 ENCOUNTER — Ambulatory Visit (INDEPENDENT_AMBULATORY_CARE_PROVIDER_SITE_OTHER): Payer: 59 | Admitting: Nurse Practitioner

## 2023-02-24 VITALS — BP 116/78 | HR 87 | Temp 97.7°F | Ht 64.5 in | Wt 199.4 lb

## 2023-02-24 DIAGNOSIS — R200 Anesthesia of skin: Secondary | ICD-10-CM

## 2023-02-24 DIAGNOSIS — F411 Generalized anxiety disorder: Secondary | ICD-10-CM

## 2023-02-24 DIAGNOSIS — E559 Vitamin D deficiency, unspecified: Secondary | ICD-10-CM

## 2023-02-24 DIAGNOSIS — I10 Essential (primary) hypertension: Secondary | ICD-10-CM | POA: Diagnosis not present

## 2023-02-24 DIAGNOSIS — K580 Irritable bowel syndrome with diarrhea: Secondary | ICD-10-CM

## 2023-02-24 DIAGNOSIS — E782 Mixed hyperlipidemia: Secondary | ICD-10-CM

## 2023-02-24 DIAGNOSIS — Z9109 Other allergy status, other than to drugs and biological substances: Secondary | ICD-10-CM

## 2023-02-24 DIAGNOSIS — Z79899 Other long term (current) drug therapy: Secondary | ICD-10-CM

## 2023-02-24 DIAGNOSIS — Z130 Encounter for screening for diseases of the blood and blood-forming organs and certain disorders involving the immune mechanism: Secondary | ICD-10-CM

## 2023-02-24 DIAGNOSIS — Z1389 Encounter for screening for other disorder: Secondary | ICD-10-CM

## 2023-02-24 DIAGNOSIS — Z0000 Encounter for general adult medical examination without abnormal findings: Secondary | ICD-10-CM | POA: Diagnosis not present

## 2023-02-24 DIAGNOSIS — K219 Gastro-esophageal reflux disease without esophagitis: Secondary | ICD-10-CM

## 2023-02-24 DIAGNOSIS — Z136 Encounter for screening for cardiovascular disorders: Secondary | ICD-10-CM | POA: Diagnosis not present

## 2023-02-24 DIAGNOSIS — R7989 Other specified abnormal findings of blood chemistry: Secondary | ICD-10-CM

## 2023-02-24 DIAGNOSIS — R7309 Other abnormal glucose: Secondary | ICD-10-CM

## 2023-02-24 DIAGNOSIS — Z1329 Encounter for screening for other suspected endocrine disorder: Secondary | ICD-10-CM

## 2023-02-24 DIAGNOSIS — Z0001 Encounter for general adult medical examination with abnormal findings: Secondary | ICD-10-CM

## 2023-02-24 DIAGNOSIS — F331 Major depressive disorder, recurrent, moderate: Secondary | ICD-10-CM

## 2023-02-24 NOTE — Progress Notes (Signed)
Complete Physical  Assessment and Plan:  Encounter for Annual Physical Exam with abnormal findings Due annually  Health Maintenance reviewed Healthy lifestyle reviewed and goals set  Recurrent major depressive disorder, in partial remission (HCC)  Doing well with prozac Continue to reduce ETOH; lifestyle discussed; sleeps fairly; exercise encouraged No SI/HI  Generalized anxiety disorder Continue to reduce alcohol; much improved  Gastroesophageal reflux disease, unspecified whether esophagitis present Monitor, normal [place], continue PPI and limit ETOH  IBS-D Improved, continue to monitor, add soluble fiber  Hyperlipidemia, mixed Recommend weight loss, low cholesterol/saturated fat diet, increase fiber and exercise.  LDL goal <100;  Check lipid panel.  -     Lipid panel -     TSH  Vitamin D deficiency Continue Vit D supplementation to maintain value in therapeutic level of 60-100  -     VITAMIN D 25 Hydroxy (Vit-D Deficiency, Fractures)  Medication management -     CBC with Differential/Platelet -     COMPLETE METABOLIC PANEL WITH GFR -     TSH -     Magnesium  Elevated LFT Limit Tylenol and alcohol use - CMP  Abnormal Glucose Continue diet and exercise -     Hemoglobin A1c  Screening for thyroid disorder - TSH  Screening for hematuria or proteinuria -     Urinalysis, Routine w reflex microscopic  Morbid obesity (HCC)- BMi 43 Long discussion about weight loss, diet, and exercise Recommended diet heavy in fruits and veggies and low in animal meats, cheeses, and dairy products, appropriate calorie intake Patient will work on moving more, decreasing saturated fats and simple carbs Follow up at next visit -     Lipid panel -     TSH -     Hemoglobin A1c  Environmental allergies Continue Zyrtec and rotate with allegra Flonase 2 sprays each nostril daily Sudafed as needed  Screening for cardiovascular condition EKG  Screening for deficiency anemia Monitor  B1-2 Monitor CBC, Ferritin, Iron, TIBC  Numbness and tingling Monitor B12, magnesium.  Orders Placed This Encounter  Procedures   CBC with Differential/Platelet   COMPLETE METABOLIC PANEL WITH GFR   Lipid panel   TSH   Hemoglobin A1c   Insulin, random   VITAMIN D 25 Hydroxy (Vit-D Deficiency, Fractures)   Urinalysis, Routine w reflex microscopic   Microalbumin / creatinine urine ratio   Vitamin B12   Iron, TIBC and Ferritin Panel   Magnesium   EKG 12-Lead   Notify office for further evaluation and treatment, questions or concerns if any reported s/s fail to improve.   The patient was advised to call back or seek an in-person evaluation if any symptoms worsen or if the condition fails to improve as anticipated.   Further disposition pending results of labs. Discussed med's effects and SE's.    I discussed the assessment and treatment plan with the patient. The patient was provided an opportunity to ask questions and all were answered. The patient agreed with the plan and demonstrated an understanding of the instructions.  Discussed med's effects and SE's. Screening labs and tests as requested with regular follow-up as recommended.  I provided 35 minutes of face-to-face time during this encounter including counseling, chart review, and critical decision making was preformed.  Today's Plan of Care is based on a patient-centered health care approach known as shared decision making - the decisions, tests and treatments allow for patient preferences and values to be balanced with clinical evidence.     Future Appointments  Date Time Provider Department Center  02/24/2024  2:00 PM Adela Glimpse, NP [facility] None     HPI  37 y.o. female  presents for a complete physical and follow up for has Vitamin D deficiency; Depression; Generalized anxiety disorder; Medication management; GERD (gastroesophageal reflux disease); Irritable bowel syndrome (IBS); Excessive drinking of  alcohol; Stress and adjustment reaction; Morbid obesity with BMI of 40.0-44.9, adult (HCC); Elevated BP without diagnosis of hypertension; ADHD; Mixed hyperlipidemia; B12 deficiency; ASCUS of cervix with negative high risk HPV; and LFT elevation on their problem list..  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 Recently laid off but feeling positive about moving forward - has some time off before she finds a new job.  She has a 10 y/ and 38 y/o boys. Divorced and happy. She is currently sexually active. Follows with GYN at [facility] annually. Has had abnormal PAPs.  Has noticed increase in numbness and tingling in fingertips though to be possible deficiency in vitamin intake.  Does notice tightness in the neck and shoulders secondary to stress.  Has recently returned for a relaxing restreat.   She is only drinking 2-3 glasses of wine 2-3 times a week.  US showed normal gallbladder, did show fatty liver. She was referred to GI, had CT that showed rotated IUD, has seen GYN, but ultimately sx improved after quitting marijuana. GERD well controlled on protonix. Also IBS with loose stools, but GI felt due to improvement no further workup needed at this time. Has reduced from 5 drinks a night to 2-3 nights. She is trying to stay well hydrated.  She has hx of depression/anxiety; poorly tolerated wellbutrin, now on prozac 20 mg BID with Buspar and doing very well. She has ADHD, on adderall 20 mg XR from Dr. Elisabeth Most.   She continues with protonix daily per GI for long GERD hx.   BMI is Body mass index is 33.7 kg/m., she is working on diet/exercise, was doing noom and an exercise program. ary drinks, some cravings.  Wt Readings from Last 3 Encounters:  02/24/23 199 lb 6.4 oz (90.4 kg)  01/29/23 208 lb 6.4 oz (94.5 kg)  01/03/23 215 lb (97.5 kg)    Today their BP is BP: 116/78  BP Readings from Last 3 Encounters:  02/24/23 116/78  01/29/23 120/82   02/15/22 122/88  She does not workout. She denies chest pain, shortness of breath, dizziness.    She is not on cholesterol medication and denies myalgias. Her cholesterol is not at goal. The cholesterol last visit was:   Lab Results  Component Value Date   CHOL 227 (H) 02/15/2022   HDL 77 02/15/2022   LDLCALC 113 (H) 02/15/2022   TRIG 257 (H) 02/15/2022   CHOLHDL 2.9 02/15/2022   She does have a history of abnormal glucose. Last A1C in the office was:  Lab Results  Component Value Date   HGBA1C 5.2 02/15/2022   Patient is on Vitamin D supplement, taking 16109 IU inconsistently.  Lab Results  Component Value Date   VD25OH 21 (L) 02/15/2022     Lab Results  Component Value Date   IRON 113 02/14/2021   TIBC 379 02/14/2021   FERRITIN 92 02/14/2021   Lab Results  Component Value Date   VITAMINB12 281 02/14/2021     Current Medications:  Current Outpatient Medications on File Prior to Visit  Medication Sig Dispense Refill   albuterol (VENTOLIN HFA) 108 (90 Base) MCG/ACT  inhaler Inhale 2 puffs into the lungs every 4 (four) hours as needed for wheezing or shortness of breath. 3 each 0   amphetamine-dextroamphetamine (ADDERALL XR) 30 MG 24 hr capsule TAKE 1 CAPSULE BY MOUTH DAILY HAVING TO CHANGE ONLY BECAUSE OF NATIONAL SHORTAGE     busPIRone (BUSPAR) 5 MG tablet Take     1/2 to 1 tablet     3 x /day      for Anxiety 90 tablet 0   cetirizine (ZYRTEC) 10 MG tablet Take 10 mg by mouth daily.     FLUoxetine (PROZAC) 20 MG capsule TAKE 1 CAPSULE DAILY FOR MOOD (Patient taking differently: 2 (two) times daily. Take  1 capsule  Daily  for Mood) 90 capsule 3   folic acid (FOLVITE) 1 MG tablet Take 1 mg by mouth daily.     levonorgestrel (MIRENA, 52 MG,) 20 MCG/DAY IUD 1 each by Intrauterine route once.     pantoprazole (PROTONIX) 40 MG tablet TAKE 1 TABLET (40 MG TOTAL) BY MOUTH TWICE A DAY BEFORE MEALS 60 tablet 2   Acetaminophen (TYLENOL PO) Take by mouth. (Patient not taking:  Reported on 02/24/2023)     Cholecalciferol 125 MCG (5000 UT) capsule Take 2 capsules (10,000 Units total) by mouth daily. (Patient not taking: Reported on 02/24/2023)     Cyanocobalamin (B-12 PO) Take 1 tablet by mouth daily. (Patient not taking: Reported on 02/24/2023)     Flaxseed, Linseed, (FLAX SEED OIL) 1000 MG CAPS Take 1,000 mg by mouth daily.  (Patient not taking: Reported on 02/24/2023)     ibuprofen (ADVIL) 200 MG tablet Take 200 mg by mouth every 6 (six) hours as needed. (Patient not taking: Reported on 02/24/2023)     Multiple Vitamin (MULTIVITAMIN WITH MINERALS) TABS tablet Take 1 tablet by mouth daily. (Patient not taking: Reported on 02/24/2023)     No current facility-administered medications on file prior to visit.   Allergies:  Allergies  Allergen Reactions   Wellbutrin [Bupropion] Hives   Zoloft [Sertraline] Other (See Comments)    Excessive sweating, insomnia   Neomycin Rash   Medical History:  She has Vitamin D deficiency; Depression; Generalized anxiety disorder; Medication management; GERD (gastroesophageal reflux disease); Irritable bowel syndrome (IBS); Excessive drinking of alcohol; Stress and adjustment reaction; Morbid obesity with BMI of 40.0-44.9, adult (HCC); Elevated BP without diagnosis of hypertension; ADHD; Mixed hyperlipidemia; B12 deficiency; ASCUS of cervix with negative high risk HPV; and LFT elevation on their problem list.   Health Maintenance:   Immunization History  Administered Date(s) Administered   HPV 9-valent 02/18/2022, 04/22/2022, 08/19/2022   Influenza Inj Mdck Quad With Preservative 01/09/2017, 01/27/2020   Influenza,inj,Quad PF,6+ Mos 11/28/2018, 11/28/2018, 02/15/2022   Influenza-Unspecified 01/28/2021, 01/04/2023   Moderna Covid-19 Fall Seasonal Vaccine 67yrs & older 01/04/2023   PFIZER(Purple Top)SARS-COV-2 Vaccination 06/29/2019, 07/20/2019, 02/10/2020, 01/27/2021   PPD Test 12/09/2013   Tdap 11/19/2010, 02/15/2013, 09/15/2014    Tetanus: 2016 Pneumovax: Prevnar 13:  Flu vaccine: today Covid 19: 2/2, pfizer, + boosters  Pap: Getting annually at GYN due to abnormal  MGM: N/A AB [place] 10/2018 normal gallbladder, fatty liver DEXA:n/v  Colonoscopy: age 27 EGD: 03/2019, gastritis, hiatal hernia   Last eye: [place] [facility] annually, glasses Last dental: 2024, goes q19m Derm:  No concerns   [facility] - Dr. Elisabeth Most ADHD   Patient Care Team: Lucky Cowboy, MD as PCP - General (Internal Medicine) Elliot Cousin, OD (Optometry) Amado Nash Candace Gallus, MD as Consulting Physician (Obstetrics and  Gynecology)  Surgical History:  She has a past surgical history that includes Wisdom tooth extraction and Colposcopy (04/08/2004). Family History:  Herfamily history includes Asthma in her sister; Cancer in her paternal grandfather and paternal grandmother; Diabetes in her maternal grandmother; Diverticulitis in her father; Heart disease in her maternal grandfather; Hyperlipidemia in her mother; Hypertension in her maternal grandmother; Osteopenia in her mother. Social History:  She reports that she quit smoking about 16 years ago. Her smoking use included cigarettes. She has never used smokeless tobacco. She reports current alcohol use of about 2.0 - 3.0 standard drinks of alcohol per week. She reports that she does not currently use drugs after having used the following drugs: Marijuana.  Review of Systems: Review of Systems  Constitutional: Negative.  Negative for malaise/fatigue and weight loss.  HENT:  Negative for congestion, hearing loss and tinnitus.   Eyes: Negative.  Negative for blurred vision and double vision.  Respiratory:  Negative for cough, shortness of breath and wheezing.   Cardiovascular: Negative.  Negative for chest pain, palpitations, orthopnea, claudication and leg swelling.  Gastrointestinal:  Positive for heartburn (improved, rare). Negative for abdominal pain, blood in stool,  constipation, diarrhea, melena, nausea and vomiting.  Genitourinary: Negative.   Musculoskeletal: Negative.  Negative for joint pain and myalgias.  Skin: Negative.  Negative for rash.  Neurological:  Negative for dizziness, tingling, sensory change, weakness and headaches.  Endo/Heme/Allergies:  Negative for polydipsia.  Psychiatric/Behavioral:  Negative for depression, hallucinations, memory loss and suicidal ideas. The patient is not nervous/anxious and does not have insomnia.   All other systems reviewed and are negative.   Physical Exam: Estimated body mass index is 33.7 kg/m as calculated from the following:   Height as of this encounter: 5' 4.5" (1.638 m).   Weight as of this encounter: 199 lb 6.4 oz (90.4 kg). BP 116/78   Pulse 87   Temp 97.7 F (36.5 C)   Ht 5' 4.5" (1.638 m)   Wt 199 lb 6.4 oz (90.4 kg)   SpO2 99%   BMI 33.70 kg/m  General Appearance: Well nourished, well dressed, morbidly obese female in no apparent distress.  Eyes: PERRLA, EOMs, conjunctiva no swelling or erythema Sinuses: No Frontal/maxillary tenderness  ENT/Mouth: Ext aud canals clear. TM's fluid noted, no erythema. Good dentition. No erythema, swelling, or exudate on post pharynx. Tonsils not swollen or erythematous. Hearing normal.  Neck: Supple, thyroid normal. No bruits  Respiratory: Respiratory effort normal, BS equal bilaterally without rales, rhonchi, wheezing or stridor.  Cardio: RRR without murmurs, rubs or gallops. Brisk peripheral pulses without edema.  Chest: symmetric, with normal excursions and percussion.  Breasts: Defer to GYN Abdomen: Soft, obese abdomen, nontender, no guarding, rebound, hernias, masses, or organomegaly.  Lymphatics: Non tender without lymphadenopathy.  Genitourinary: Defer to GYN Musculoskeletal: Full ROM all peripheral extremities,5/5 strength, and normal gait.  Skin: Warm, dry without rashes, lesions, ecchymosis. Neuro: Cranial nerves intact, reflexes equal  bilaterally. Normal muscle tone, no cerebellar symptoms. Sensation intact.  Psych: Awake and oriented X 3, normal affect, Insight and Judgment appropriate.   EKG: NSR  Earle Troiano 2:47 PM [facility]

## 2023-02-24 NOTE — Patient Instructions (Signed)

## 2023-02-25 ENCOUNTER — Other Ambulatory Visit: Payer: Self-pay | Admitting: Nurse Practitioner

## 2023-02-25 DIAGNOSIS — F411 Generalized anxiety disorder: Secondary | ICD-10-CM

## 2023-02-25 DIAGNOSIS — Z79899 Other long term (current) drug therapy: Secondary | ICD-10-CM

## 2023-02-25 LAB — URINALYSIS, ROUTINE W REFLEX MICROSCOPIC
Bilirubin Urine: NEGATIVE
Glucose, UA: NEGATIVE
Hgb urine dipstick: NEGATIVE
Ketones, ur: NEGATIVE
Leukocytes,Ua: NEGATIVE
Nitrite: NEGATIVE
Protein, ur: NEGATIVE
Specific Gravity, Urine: 1.002 (ref 1.001–1.035)
pH: 6.5 (ref 5.0–8.0)

## 2023-02-25 LAB — LIPID PANEL
Cholesterol: 251 mg/dL — ABNORMAL HIGH (ref <200)
HDL: 80 mg/dL (ref >=50)
LDL Cholesterol (Calc): 145 mg/dL — ABNORMAL HIGH
Non-HDL Cholesterol (Calc): 171 mg/dL — ABNORMAL HIGH (ref <130)
Total CHOL/HDL Ratio: 3.1 (calc) (ref <5.0)
Triglycerides: 137 mg/dL (ref <150)

## 2023-02-25 LAB — COMPLETE METABOLIC PANEL WITH GFR
AG Ratio: 1.8 (calc) (ref 1.0–2.5)
ALT: 47 U/L — ABNORMAL HIGH (ref 6–29)
AST: 48 U/L — ABNORMAL HIGH (ref 10–30)
Albumin: 4.7 g/dL (ref 3.6–5.1)
Alkaline phosphatase (APISO): 61 U/L (ref 31–125)
BUN/Creatinine Ratio: 5 (calc) — ABNORMAL LOW (ref 6–22)
BUN: 4 mg/dL — ABNORMAL LOW (ref 7–25)
CO2: 30 mmol/L (ref 20–32)
Calcium: 9.6 mg/dL (ref 8.6–10.2)
Chloride: 100 mmol/L (ref 98–110)
Creat: 0.73 mg/dL (ref 0.50–0.97)
Globulin: 2.6 g/dL (ref 1.9–3.7)
Glucose, Bld: 85 mg/dL (ref 65–99)
Potassium: 3.8 mmol/L (ref 3.5–5.3)
Sodium: 138 mmol/L (ref 135–146)
Total Bilirubin: 0.8 mg/dL (ref 0.2–1.2)
Total Protein: 7.3 g/dL (ref 6.1–8.1)
eGFR: 108 mL/min/{1.73_m2} (ref >=60)

## 2023-02-25 LAB — CBC WITH DIFFERENTIAL/PLATELET
Absolute Lymphocytes: 1717 {cells}/uL (ref 850–3900)
Absolute Monocytes: 508 {cells}/uL (ref 200–950)
Basophils Absolute: 31 {cells}/uL (ref 0–200)
Basophils Relative: 0.5 %
Eosinophils Absolute: 99 {cells}/uL (ref 15–500)
Eosinophils Relative: 1.6 %
HCT: 45 % (ref 35.0–45.0)
Hemoglobin: 14.8 g/dL (ref 11.7–15.5)
MCH: 29.5 pg (ref 27.0–33.0)
MCHC: 32.9 g/dL (ref 32.0–36.0)
MCV: 89.8 fL (ref 80.0–100.0)
MPV: 10.1 fL (ref 7.5–12.5)
Monocytes Relative: 8.2 %
Neutro Abs: 3844 {cells}/uL (ref 1500–7800)
Neutrophils Relative %: 62 %
Platelets: 277 10*3/uL (ref 140–400)
RBC: 5.01 10*6/uL (ref 3.80–5.10)
RDW: 13.2 % (ref 11.0–15.0)
Total Lymphocyte: 27.7 %
WBC: 6.2 10*3/uL (ref 3.8–10.8)

## 2023-02-25 LAB — IRON,TIBC AND FERRITIN PANEL
%SAT: 29 % (ref 16–45)
Ferritin: 48 ng/mL (ref 16–154)
Iron: 117 ug/dL (ref 40–190)
TIBC: 404 ug/dL (ref 250–450)

## 2023-02-25 LAB — HEMOGLOBIN A1C
Hgb A1c MFr Bld: 5 %{Hb} (ref <5.7)
Mean Plasma Glucose: 97 mg/dL
eAG (mmol/L): 5.4 mmol/L

## 2023-02-25 LAB — MICROALBUMIN / CREATININE URINE RATIO
Creatinine, Urine: 24 mg/dL (ref 20–275)
Microalb, Ur: <0.2 mg/dL

## 2023-02-25 LAB — INSULIN, RANDOM: Insulin: 3.1 u[IU]/mL

## 2023-02-25 LAB — TSH: TSH: 1.29 m[IU]/L

## 2023-02-25 LAB — MAGNESIUM: Magnesium: 1.7 mg/dL (ref 1.5–2.5)

## 2023-02-25 LAB — VITAMIN D 25 HYDROXY (VIT D DEFICIENCY, FRACTURES): Vit D, 25-Hydroxy: 29 ng/mL — ABNORMAL LOW (ref 30–100)

## 2023-02-25 LAB — VITAMIN B12: Vitamin B-12: 334 pg/mL (ref 200–1100)

## 2023-03-03 ENCOUNTER — Encounter: Payer: Self-pay | Admitting: Nurse Practitioner

## 2023-03-03 DIAGNOSIS — F411 Generalized anxiety disorder: Secondary | ICD-10-CM

## 2023-03-03 DIAGNOSIS — Z79899 Other long term (current) drug therapy: Secondary | ICD-10-CM

## 2023-03-04 LAB — GENECONNECT MOLECULAR SCREEN

## 2023-03-10 ENCOUNTER — Other Ambulatory Visit: Payer: Self-pay | Admitting: Medical Genetics

## 2023-03-10 ENCOUNTER — Telehealth: Payer: Self-pay | Admitting: Medical Genetics

## 2023-03-10 DIAGNOSIS — Z006 Encounter for examination for normal comparison and control in clinical research program: Secondary | ICD-10-CM

## 2023-03-10 MED ORDER — FLUOXETINE HCL 20 MG PO CAPS: 20.0000 mg | ORAL_CAPSULE | Freq: Three times a day (TID) | ORAL | 2 refills | Status: DC

## 2023-03-10 MED ORDER — BUSPIRONE HCL 15 MG PO TABS: 15.0000 mg | ORAL_TABLET | Freq: Two times a day (BID) | ORAL | 2 refills | Status: DC

## 2023-03-10 NOTE — Progress Notes (Signed)
Original [facility] sample was a TNP. New order needed. Confirmed consent is on file.

## 2023-03-10 NOTE — Telephone Encounter (Signed)
[facility]  03/10/2023 @ 10:12 AM  Confirmed I was speaking with Angela Dixon [phone] by using name and DOB. Informed participant the reason for this call is to follow-up on a recent blood sample the participant provided at one of the [facility] lab locations. Informed participant the test was not able to be completed with this sample and apologized for the inconvenience. Participant was requested to provide a new sample at one of our participating labs at no cost so that participant can continue participation and receive test results. Informed participant they do not need to be fasting and if there are other samples that need to be drawn, they can be done at the same visit. Participant has not had a blood transfusion or blood product in the last 30 days. Participant agreed to provide another sample. Participant was provided the [employer] program website to learn why this may have happened. Participant was thanked for their time and continued support of the above study.

## 2023-03-11 ENCOUNTER — Other Ambulatory Visit (HOSPITAL_COMMUNITY)
Admission: RE | Admit: 2023-03-11 | Discharge: 2023-03-11 | Disposition: A | Discharge status: Home / Self Care | Payer: Self-pay | Source: Ambulatory Visit | Attending: Oncology | Admitting: Oncology

## 2023-03-11 DIAGNOSIS — Z006 Encounter for examination for normal comparison and control in clinical research program: Secondary | ICD-10-CM | POA: Insufficient documentation

## 2023-04-04 ENCOUNTER — Other Ambulatory Visit: Payer: Self-pay | Admitting: Medical Genetics

## 2023-04-04 DIAGNOSIS — Z006 Encounter for examination for normal comparison and control in clinical research program: Secondary | ICD-10-CM | POA: Insufficient documentation

## 2023-04-04 NOTE — Progress Notes (Signed)
 New order requested. Confirmed consent on file.

## 2023-05-26 DIAGNOSIS — F319 Bipolar disorder, unspecified: Secondary | ICD-10-CM | POA: Diagnosis not present

## 2023-05-26 DIAGNOSIS — F411 Generalized anxiety disorder: Secondary | ICD-10-CM | POA: Diagnosis not present

## 2023-05-26 DIAGNOSIS — F902 Attention-deficit hyperactivity disorder, combined type: Secondary | ICD-10-CM | POA: Diagnosis not present

## 2023-05-26 DIAGNOSIS — F331 Major depressive disorder, recurrent, moderate: Secondary | ICD-10-CM | POA: Diagnosis not present

## 2023-05-29 ENCOUNTER — Ambulatory Visit: Payer: 59 | Admitting: Nurse Practitioner

## 2023-06-09 DIAGNOSIS — F331 Major depressive disorder, recurrent, moderate: Secondary | ICD-10-CM | POA: Diagnosis not present

## 2023-06-09 DIAGNOSIS — F902 Attention-deficit hyperactivity disorder, combined type: Secondary | ICD-10-CM | POA: Diagnosis not present

## 2023-06-09 DIAGNOSIS — F411 Generalized anxiety disorder: Secondary | ICD-10-CM | POA: Diagnosis not present

## 2023-06-09 DIAGNOSIS — F319 Bipolar disorder, unspecified: Secondary | ICD-10-CM | POA: Diagnosis not present

## 2023-06-11 DIAGNOSIS — F902 Attention-deficit hyperactivity disorder, combined type: Secondary | ICD-10-CM | POA: Diagnosis not present

## 2023-06-11 DIAGNOSIS — Z79899 Other long term (current) drug therapy: Secondary | ICD-10-CM | POA: Diagnosis not present

## 2023-06-12 ENCOUNTER — Telehealth: Admitting: Nurse Practitioner

## 2023-06-12 DIAGNOSIS — L089 Local infection of the skin and subcutaneous tissue, unspecified: Secondary | ICD-10-CM

## 2023-06-12 MED ORDER — SULFAMETHOXAZOLE-TRIMETHOPRIM 800-160 MG PO TABS: 1.0000 | ORAL_TABLET | Freq: Two times a day (BID) | ORAL | 0 refills | Status: AC

## 2023-06-12 NOTE — Progress Notes (Signed)
 I have spent 5 minutes in review of e-visit questionnaire, review and updating patient chart, medical decision making and response to patient.   Claiborne Rigg, NP

## 2023-06-12 NOTE — Progress Notes (Signed)
 E Visit for Cellulitis  We are sorry that you are not feeling well. Here is how we plan to help!  Based on what you shared with me it looks like you have cellulitis.  Cellulitis looks like areas of skin redness, swelling, and warmth; it develops as a result of bacteria entering under the skin. Little red spots and/or bleeding can be seen in skin, and tiny surface sacs containing fluid can occur. Fever can be present. Cellulitis is almost always on one side of a body, and the lower limbs are the most common site of involvement.   I have prescribed:  Bactrim DS 1 tablet by mouth twice a day for 7 days  HOME CARE:  Take your medications as ordered and take all of them, even if the skin irritation appears to be healing.   GET HELP RIGHT AWAY IF:  Symptoms that don't begin to go away within 48 hours. Severe redness persists or worsens If the area turns color, spreads or swells. If it blisters and opens, develops yellow-brown crust or bleeds. You develop a fever or chills. If the pain increases or becomes unbearable.  Are unable to keep fluids and food down.  MAKE SURE YOU   Understand these instructions. Will watch your condition. Will get help right away if you are not doing well or get worse.  Thank you for choosing an e-visit.  Your e-visit answers were reviewed by a board certified advanced clinical practitioner to complete your personal care plan. Depending upon the condition, your plan could have included both over the counter or prescription medications.  Please review your pharmacy choice. Make sure the pharmacy is open so you can pick up prescription now. If there is a problem, you may contact your provider through [employer] and have the prescription routed to another pharmacy.  Your safety is important to [place]. If you have drug allergies check your prescription carefully.   For the next 24 hours you can use MyChart to ask questions about today's visit, request a  non-urgent call back, or ask for a work or school excuse. You will get an email in the next two days asking about your experience. I hope that your e-visit has been valuable and will speed your recovery.

## 2023-06-17 DIAGNOSIS — Z6832 Body mass index (BMI) 32.0-32.9, adult: Secondary | ICD-10-CM | POA: Diagnosis not present

## 2023-06-17 DIAGNOSIS — H6503 Acute serous otitis media, bilateral: Secondary | ICD-10-CM | POA: Diagnosis not present

## 2023-06-23 DIAGNOSIS — F411 Generalized anxiety disorder: Secondary | ICD-10-CM | POA: Diagnosis not present

## 2023-06-23 DIAGNOSIS — F331 Major depressive disorder, recurrent, moderate: Secondary | ICD-10-CM | POA: Diagnosis not present

## 2023-06-23 DIAGNOSIS — F319 Bipolar disorder, unspecified: Secondary | ICD-10-CM | POA: Diagnosis not present

## 2023-06-23 DIAGNOSIS — F902 Attention-deficit hyperactivity disorder, combined type: Secondary | ICD-10-CM | POA: Diagnosis not present

## 2023-07-18 ENCOUNTER — Other Ambulatory Visit: Payer: Self-pay | Admitting: Medical Genetics

## 2023-07-18 ENCOUNTER — Other Ambulatory Visit

## 2023-07-18 DIAGNOSIS — Z006 Encounter for examination for normal comparison and control in clinical research program: Secondary | ICD-10-CM

## 2023-07-22 DIAGNOSIS — F331 Major depressive disorder, recurrent, moderate: Secondary | ICD-10-CM | POA: Diagnosis not present

## 2023-07-22 DIAGNOSIS — F411 Generalized anxiety disorder: Secondary | ICD-10-CM | POA: Diagnosis not present

## 2023-07-22 DIAGNOSIS — F902 Attention-deficit hyperactivity disorder, combined type: Secondary | ICD-10-CM | POA: Diagnosis not present

## 2023-07-22 DIAGNOSIS — F319 Bipolar disorder, unspecified: Secondary | ICD-10-CM | POA: Diagnosis not present

## 2023-07-27 LAB — GENECONNECT MOLECULAR SCREEN: Genetic Analysis Overall Interpretation: NEGATIVE

## 2023-08-05 LAB — LAB REPORT - SCANNED
Free T4: 5.9 ng/dL
TSH: 3.12 (ref 0.41–5.90)

## 2023-08-06 DIAGNOSIS — F331 Major depressive disorder, recurrent, moderate: Secondary | ICD-10-CM | POA: Diagnosis not present

## 2023-08-06 DIAGNOSIS — F411 Generalized anxiety disorder: Secondary | ICD-10-CM | POA: Diagnosis not present

## 2023-08-06 DIAGNOSIS — F902 Attention-deficit hyperactivity disorder, combined type: Secondary | ICD-10-CM | POA: Diagnosis not present

## 2023-08-06 DIAGNOSIS — F319 Bipolar disorder, unspecified: Secondary | ICD-10-CM | POA: Diagnosis not present

## 2023-08-21 DIAGNOSIS — F319 Bipolar disorder, unspecified: Secondary | ICD-10-CM | POA: Diagnosis not present

## 2023-08-21 DIAGNOSIS — F902 Attention-deficit hyperactivity disorder, combined type: Secondary | ICD-10-CM | POA: Diagnosis not present

## 2023-08-21 DIAGNOSIS — F411 Generalized anxiety disorder: Secondary | ICD-10-CM | POA: Diagnosis not present

## 2023-08-21 DIAGNOSIS — F331 Major depressive disorder, recurrent, moderate: Secondary | ICD-10-CM | POA: Diagnosis not present

## 2023-09-10 DIAGNOSIS — F411 Generalized anxiety disorder: Secondary | ICD-10-CM | POA: Diagnosis not present

## 2023-09-10 DIAGNOSIS — F331 Major depressive disorder, recurrent, moderate: Secondary | ICD-10-CM | POA: Diagnosis not present

## 2023-09-10 DIAGNOSIS — F902 Attention-deficit hyperactivity disorder, combined type: Secondary | ICD-10-CM | POA: Diagnosis not present

## 2023-09-10 DIAGNOSIS — F319 Bipolar disorder, unspecified: Secondary | ICD-10-CM | POA: Diagnosis not present

## 2023-09-14 DIAGNOSIS — E669 Obesity, unspecified: Secondary | ICD-10-CM | POA: Diagnosis not present

## 2023-10-01 DIAGNOSIS — F902 Attention-deficit hyperactivity disorder, combined type: Secondary | ICD-10-CM | POA: Diagnosis not present

## 2023-10-01 DIAGNOSIS — F331 Major depressive disorder, recurrent, moderate: Secondary | ICD-10-CM | POA: Diagnosis not present

## 2023-10-01 DIAGNOSIS — F319 Bipolar disorder, unspecified: Secondary | ICD-10-CM | POA: Diagnosis not present

## 2023-10-01 DIAGNOSIS — F411 Generalized anxiety disorder: Secondary | ICD-10-CM | POA: Diagnosis not present

## 2023-10-14 DIAGNOSIS — E669 Obesity, unspecified: Secondary | ICD-10-CM | POA: Diagnosis not present

## 2023-10-17 ENCOUNTER — Ambulatory Visit: Admitting: Family Medicine

## 2023-10-22 ENCOUNTER — Ambulatory Visit

## 2023-10-22 ENCOUNTER — Ambulatory Visit (INDEPENDENT_AMBULATORY_CARE_PROVIDER_SITE_OTHER): Admitting: Family Medicine

## 2023-10-22 ENCOUNTER — Encounter: Payer: Self-pay | Admitting: Family Medicine

## 2023-10-22 VITALS — BP 112/76 | HR 103 | Temp 98.4°F | Ht 64.5 in | Wt 188.0 lb

## 2023-10-22 DIAGNOSIS — E669 Obesity, unspecified: Secondary | ICD-10-CM | POA: Diagnosis not present

## 2023-10-22 DIAGNOSIS — M50322 Other cervical disc degeneration at C5-C6 level: Secondary | ICD-10-CM | POA: Diagnosis not present

## 2023-10-22 DIAGNOSIS — E782 Mixed hyperlipidemia: Secondary | ICD-10-CM | POA: Diagnosis not present

## 2023-10-22 DIAGNOSIS — R200 Anesthesia of skin: Secondary | ICD-10-CM

## 2023-10-22 DIAGNOSIS — E538 Deficiency of other specified B group vitamins: Secondary | ICD-10-CM

## 2023-10-22 DIAGNOSIS — M549 Dorsalgia, unspecified: Secondary | ICD-10-CM | POA: Diagnosis not present

## 2023-10-22 DIAGNOSIS — R202 Paresthesia of skin: Secondary | ICD-10-CM

## 2023-10-22 DIAGNOSIS — E559 Vitamin D deficiency, unspecified: Secondary | ICD-10-CM | POA: Diagnosis not present

## 2023-10-22 MED ORDER — GABAPENTIN 100 MG PO CAPS: 100.0000 mg | ORAL_CAPSULE | Freq: Three times a day (TID) | ORAL | 0 refills | Status: DC

## 2023-10-22 MED ORDER — FLUOXETINE HCL 20 MG PO CAPS: 20.0000 mg | ORAL_CAPSULE | Freq: Every day | ORAL | 2 refills | Status: DC

## 2023-10-22 NOTE — Patient Instructions (Addendum)
-  It was nice to meet you and look forward to taking care of you.  -Ordered labs (CMP, Lipid panel, A1c, vitamin B12/D) based on BMI, vitamin deficiencies, and numbness and tingling. Please make an appointment to have labs drawn when fasting.  -Ordered cervical and lumbar spine x-ray for a reason of numbness and tingling. Office will call with results and will be available on MyChart.  -Continue all medications.  -Prescribed Gabapentin  100mg  tablet, take 1 tablet every 8 hours as needed for numbness and tingling for when going on trip. Caution: medication can cause drowsiness and dizziness.  -Follow up in 1 year for a physical.

## 2023-10-22 NOTE — Progress Notes (Signed)
 New Patient Office Visit  Subjective   Patient ID: Angela Dixon, female    DOB: April 11, 1985  Age: 38 y.o. MRN: [id number]  CC:  Chief Complaint  Patient presents with   Establish Care    HPI Angela Dixon presents to establish care with new provider.  Patients previous primary care provider: [facility] with Dr. Elsie Richards.  Specialist: Psychiatry: [facility]- Cruz Brakeman, NP  [facility]  Asthma: Intermittent. Prescribed Albuterol  Inhaler 2 puffs every 4 hours as needed. Effective and does not use very often.   Patient is concerned about circulation. She reports she is having experiencing some numbness and tingling in face, hands, and mostly left leg. Started about 1.5 years ago. Left leg numbness is occurring multiple times a week, lasting seconds to minutes depending on movement. Facial numbness has only occurred once and hands occurring once a month. Lasting minutes.  Outpatient Encounter Medications as of 10/22/2023  Medication Sig   albuterol  (VENTOLIN  HFA) 108 (90 Base) MCG/ACT inhaler Inhale 2 puffs into the lungs every 4 (four) hours as needed for wheezing or shortness of breath.   amphetamine-dextroamphetamine (ADDERALL XR) 30 MG 24 hr capsule TAKE 1 CAPSULE BY MOUTH DAILY HAVING TO CHANGE ONLY BECAUSE OF NATIONAL SHORTAGE   amphetamine-dextroamphetamine (ADDERALL) 10 MG tablet Take 10 mg by mouth daily.   ARIPiprazole (ABILIFY) 5 MG tablet Take 5 mg by mouth daily.   busPIRone  (BUSPAR ) 15 MG tablet Take 1 tablet (15 mg total) by mouth 2 (two) times daily.   cetirizine (ZYRTEC) 10 MG tablet Take 10 mg by mouth daily.   FLUoxetine  (PROZAC ) 10 MG capsule Take 10 mg by mouth daily.   FLUoxetine  (PROZAC ) 20 MG capsule Take 20 mg by mouth daily.   folic acid (FOLVITE) 1 MG tablet Take 1 mg by mouth daily.   gabapentin  (NEURONTIN ) 100 MG capsule Take 1 capsule (100 mg total) by mouth 3 (three) times daily for 7 days.    levonorgestrel (MIRENA, 52 MG,) 20 MCG/DAY IUD 1 each by Intrauterine route once.   pantoprazole  (PROTONIX ) 40 MG tablet TAKE 1 TABLET (40 MG TOTAL) BY MOUTH TWICE A DAY BEFORE MEALS   [DISCONTINUED] FLUoxetine  (PROZAC ) 20 MG capsule Take 1 capsule (20 mg total) by mouth in the morning, at noon, and at bedtime.   [DISCONTINUED] Acetaminophen  (TYLENOL  PO) Take by mouth. (Patient not taking: Reported on 02/24/2023)   [DISCONTINUED] Cholecalciferol  125 MCG (5000 UT) capsule Take 2 capsules (10,000 Units total) by mouth daily. (Patient not taking: Reported on 02/24/2023)   [DISCONTINUED] Cyanocobalamin (B-12 PO) Take 1 tablet by mouth daily. (Patient not taking: Reported on 02/24/2023)   [DISCONTINUED] Flaxseed, Linseed, (FLAX SEED OIL) 1000 MG CAPS Take 1,000 mg by mouth daily.  (Patient not taking: Reported on 02/24/2023)   [DISCONTINUED] FLUoxetine  (PROZAC ) 20 MG capsule Take 1 capsule (20 mg total) by mouth daily.   [DISCONTINUED] ibuprofen  (ADVIL ) 200 MG tablet Take 200 mg by mouth every 6 (six) hours as needed. (Patient not taking: Reported on 02/24/2023)   [DISCONTINUED] Multiple Vitamin (MULTIVITAMIN WITH MINERALS) TABS tablet Take 1 tablet by mouth daily. (Patient not taking: Reported on 02/24/2023)   No facility-administered encounter medications on file as of 10/22/2023.    Past Medical History:  Diagnosis Date   Abnormal Pap smear    Acid reflux    ADHD    Allergy    Anemia    Anxiety    Cannabinoid hyperemesis syndrome 02/28/2019  Chlamydia 2006   Depression    Fracture    Hypercholesterolemia    IBS (irritable bowel syndrome)    Vitamin D  deficiency     Past Surgical History:  Procedure Laterality Date   COLPOSCOPY     x 2, around 2004 and 2023   WISDOM TOOTH EXTRACTION      Family History  Problem Relation Age of Onset   Hyperlipidemia Mother    Osteopenia Mother    Alcohol abuse Mother    Cancer Mother        Lung   COPD Mother    Diverticulitis Father     Alcohol abuse Father    Asthma Sister    Hypertension Maternal Grandmother    Diabetes Maternal Grandmother    Heart disease Maternal Grandfather    Cancer Maternal Grandfather    COPD Maternal Grandfather    Cancer Paternal Grandmother        throat, smoker   Cancer Paternal Grandfather        lung, smoker   COPD Paternal Grandfather    Colon cancer Neg Hx    Esophageal cancer Neg Hx    Stomach cancer Neg Hx    Rectal cancer Neg Hx     Social History   Socioeconomic History   Marital status: Divorced    Spouse name: Not on file   Number of children: 2   Years of education: Not on file   Highest education level: Master's degree (e.g., MA, MS, MEng, MEd, MSW, MBA)  Occupational History   Not on file  Tobacco Use   Smoking status: Former    Current packs/day: 0.00    Average packs/day: 1 pack/day for 3.0 years (3.0 ttl pk-yrs)    Types: Cigarettes    Quit date: 07/09/2006    Years since quitting: 17.2   Smokeless tobacco: Never  Vaping Use   Vaping status: Never Used  Substance and Sexual Activity   Alcohol use: Yes    Alcohol/week: 3.0 - 4.0 standard drinks of alcohol    Types: 3 - 4 Glasses of wine per week   Drug use: Not Currently    Types: Marijuana    Comment: last 05/2019   Sexual activity: Not Currently    Partners: Male    Birth control/protection: Condom, I.U.D.    Comment: condoms intermittent, lifetime partners 71  Other Topics Concern   Not on file  Social History Narrative   Not on file   Social Drivers of Health   Financial Resource Strain: Low Risk  (10/21/2023)   Overall Financial Resource Strain (CARDIA)    Difficulty of Paying Living Expenses: Not hard at all  Food Insecurity: No Food Insecurity (10/21/2023)   Hunger Vital Sign    Worried About Running Out of Food in the Last Year: Never true    Ran Out of Food in the Last Year: Never true  Transportation Needs: No Transportation Needs (10/21/2023)   PRAPARE - Scientist, research (physical sciences) (Medical): No    Lack of Transportation (Non-Medical): No  Physical Activity: Insufficiently Active (10/21/2023)   Exercise Vital Sign    Days of Exercise per Week: 4 days    Minutes of Exercise per Session: 20 min  Stress: Stress Concern Present (10/21/2023)   [employer] of Occupational Health - Occupational Stress Questionnaire    Feeling of Stress: To some extent  Social Connections: Socially Isolated (10/21/2023)   Social Connection and Isolation Panel    Frequency  of Communication with Friends and Family: Twice a week    Frequency of Social Gatherings with Friends and Family: More than three times a week    Attends Religious Services: Never    Database administrator or Organizations: No    Attends Engineer, structural: Not on file    Marital Status: Divorced  Intimate Partner Violence: Not At Risk (10/22/2023)   Humiliation, Afraid, Rape, and Kick questionnaire    Fear of Current or Ex-Partner: No    Emotionally Abused: No    Physically Abused: No    Sexually Abused: No    ROS See HPI above    Objective  BP 112/76   Pulse (!) 103   Temp 98.4 F (36.9 C) (Oral)   Ht 5' 4.5 (1.638 m)   Wt 188 lb (85.3 kg)   SpO2 98%   BMI 31.77 kg/m   Physical Exam Vitals reviewed.  Constitutional:      General: She is not in acute distress.    Appearance: Normal appearance. She is not ill-appearing or toxic-appearing.  HENT:     Head: Normocephalic and atraumatic.     Right Ear: Tympanic membrane, ear canal and external ear normal. There is no impacted cerumen.     Left Ear: Tympanic membrane, ear canal and external ear normal. There is no impacted cerumen.     Mouth/Throat:     Mouth: Mucous membranes are moist.     Pharynx: Oropharynx is clear. No oropharyngeal exudate or posterior oropharyngeal erythema.  Eyes:     General:        Right eye: No discharge.        Left eye: No discharge.     Conjunctiva/sclera: Conjunctivae normal.     Pupils:  Pupils are equal, round, and reactive to light.  Neck:     Thyroid : No thyromegaly.  Cardiovascular:     Rate and Rhythm: Normal rate and regular rhythm.     Heart sounds: Normal heart sounds. No murmur heard.    No friction rub. No gallop.  Pulmonary:     Effort: Pulmonary effort is normal. No respiratory distress.     Breath sounds: Normal breath sounds.  Abdominal:     General: Abdomen is flat. Bowel sounds are normal.     Palpations: Abdomen is soft.  Musculoskeletal:        General: Normal range of motion.     Cervical back: Normal range of motion. No swelling or tenderness.     Thoracic back: No swelling or tenderness.     Lumbar back: No swelling or tenderness.     Right lower leg: No edema.     Left lower leg: No edema.  Lymphadenopathy:     Cervical: No cervical adenopathy.  Skin:    General: Skin is warm and dry.  Neurological:     General: No focal deficit present.     Mental Status: She is alert. Mental status is at baseline.  Psychiatric:        Mood and Affect: Mood normal.        Behavior: Behavior normal.        Thought Content: Thought content normal.        Judgment: Judgment normal.      Assessment & Plan:  Obesity (BMI 30-39.9) -     Comprehensive metabolic panel with GFR; Future -     Lipid panel; Future -     Hemoglobin A1c; Future  Vitamin D   deficiency -     VITAMIN D  25 Hydroxy (Vit-D Deficiency, Fractures); Future  Vitamin B 12 deficiency -     Vitamin B12; Future  Elevated cholesterol with high triglycerides -     Lipid panel; Future  Numbness and tingling of left upper and lower extremity -     Vitamin B12; Future -     DG Cervical Spine Complete; Future -     DG Lumbar Spine 2-3 Views; Future -     Gabapentin ; Take 1 capsule (100 mg total) by mouth 3 (three) times daily for 7 days.  Dispense: 21 capsule; Refill: 0   1.Review health maintenance:  -Cervical cancer screening: October 2024, request records -Covid booster: Not UTD   -Hep B vaccine: Thinks she had when she was younger  2.Ordered labs (CMP, Lipid panel, A1c, vitamin B12/D) based on BMI, vitamin deficiencies, and numbness and tingling. Please make an appointment to have labs drawn when fasting.  3.Ordered cervical and lumbar spine x-ray for a reason of numbness and tingling. Concerned that she may have some pinched nerve as a reason for symptoms.  4.Continue all medications.  5. -Prescribed Gabapentin  100mg  tablet, take 1 tablet every 8 hours as needed for numbness and tingling for when going on trip. Caution: medication can cause drowsiness and dizziness.  Return in about 1 year (around 10/21/2024) for Lab appointment-fasting , physical.   Steele Ledonne, NP

## 2023-10-23 ENCOUNTER — Ambulatory Visit: Payer: Self-pay | Admitting: Family Medicine

## 2023-10-23 ENCOUNTER — Other Ambulatory Visit (INDEPENDENT_AMBULATORY_CARE_PROVIDER_SITE_OTHER)

## 2023-10-23 DIAGNOSIS — E782 Mixed hyperlipidemia: Secondary | ICD-10-CM

## 2023-10-23 DIAGNOSIS — E669 Obesity, unspecified: Secondary | ICD-10-CM | POA: Diagnosis not present

## 2023-10-23 DIAGNOSIS — R202 Paresthesia of skin: Secondary | ICD-10-CM | POA: Diagnosis not present

## 2023-10-23 DIAGNOSIS — M1990 Unspecified osteoarthritis, unspecified site: Secondary | ICD-10-CM

## 2023-10-23 DIAGNOSIS — E559 Vitamin D deficiency, unspecified: Secondary | ICD-10-CM | POA: Diagnosis not present

## 2023-10-23 DIAGNOSIS — E538 Deficiency of other specified B group vitamins: Secondary | ICD-10-CM

## 2023-10-23 DIAGNOSIS — R200 Anesthesia of skin: Secondary | ICD-10-CM

## 2023-10-23 LAB — LIPID PANEL
Cholesterol: 256 mg/dL — ABNORMAL HIGH (ref 0–200)
HDL: 90.5 mg/dL (ref >39.00)
LDL Cholesterol: 142 mg/dL — ABNORMAL HIGH (ref 0–99)
NonHDL: 165.11
Total CHOL/HDL Ratio: 3
Triglycerides: 115 mg/dL (ref 0.0–149.0)
VLDL: 23 mg/dL (ref 0.0–40.0)

## 2023-10-23 LAB — COMPREHENSIVE METABOLIC PANEL WITH GFR
ALT: 14 U/L (ref 0–35)
AST: 24 U/L (ref 0–37)
Albumin: 4.7 g/dL (ref 3.5–5.2)
Alkaline Phosphatase: 47 U/L (ref 39–117)
BUN: 10 mg/dL (ref 6–23)
CO2: 25 meq/L (ref 19–32)
Calcium: 9.6 mg/dL (ref 8.4–10.5)
Chloride: 100 meq/L (ref 96–112)
Creatinine, Ser: 0.74 mg/dL (ref 0.40–1.20)
GFR: 102.17 mL/min (ref >60.00)
Glucose, Bld: 98 mg/dL (ref 70–99)
Potassium: 3.5 meq/L (ref 3.5–5.1)
Sodium: 137 meq/L (ref 135–145)
Total Bilirubin: 0.7 mg/dL (ref 0.2–1.2)
Total Protein: 7.5 g/dL (ref 6.0–8.3)

## 2023-10-23 LAB — VITAMIN D 25 HYDROXY (VIT D DEFICIENCY, FRACTURES): VITD: 33.87 ng/mL (ref 30.00–100.00)

## 2023-10-23 LAB — VITAMIN B12: Vitamin B-12: 168 pg/mL — ABNORMAL LOW (ref 211–911)

## 2023-10-23 LAB — HEMOGLOBIN A1C: Hgb A1c MFr Bld: 5.1 % (ref 4.6–6.5)

## 2023-10-27 NOTE — Addendum Note (Signed)
 Addended by: ELNER NANNY B on: 10/27/2023 11:14 AM   Modules accepted: Orders

## 2023-10-28 ENCOUNTER — Ambulatory Visit (INDEPENDENT_AMBULATORY_CARE_PROVIDER_SITE_OTHER)

## 2023-10-28 DIAGNOSIS — E538 Deficiency of other specified B group vitamins: Secondary | ICD-10-CM | POA: Diagnosis not present

## 2023-10-28 MED ORDER — CYANOCOBALAMIN 1000 MCG/ML IJ SOLN
1000.0000 ug | Freq: Once | INTRAMUSCULAR | Status: AC
  Administered 2023-10-28: 1000 ug via INTRAMUSCULAR

## 2023-10-28 NOTE — Progress Notes (Signed)
 Patient is in office today for a nurse visit for B12 Injection. Patient Injection was given in the  Right deltoid. Patient tolerated injection well.

## 2023-10-29 ENCOUNTER — Other Ambulatory Visit: Payer: Self-pay

## 2023-10-29 ENCOUNTER — Ambulatory Visit: Attending: Family Medicine | Admitting: Rehabilitative and Restorative Service Providers"

## 2023-10-29 ENCOUNTER — Encounter: Payer: Self-pay | Admitting: Rehabilitative and Restorative Service Providers"

## 2023-10-29 DIAGNOSIS — F319 Bipolar disorder, unspecified: Secondary | ICD-10-CM | POA: Diagnosis not present

## 2023-10-29 DIAGNOSIS — M6281 Muscle weakness (generalized): Secondary | ICD-10-CM | POA: Diagnosis not present

## 2023-10-29 DIAGNOSIS — F411 Generalized anxiety disorder: Secondary | ICD-10-CM | POA: Diagnosis not present

## 2023-10-29 DIAGNOSIS — R2689 Other abnormalities of gait and mobility: Secondary | ICD-10-CM | POA: Diagnosis not present

## 2023-10-29 DIAGNOSIS — M5416 Radiculopathy, lumbar region: Secondary | ICD-10-CM | POA: Insufficient documentation

## 2023-10-29 DIAGNOSIS — M542 Cervicalgia: Secondary | ICD-10-CM | POA: Diagnosis not present

## 2023-10-29 DIAGNOSIS — F902 Attention-deficit hyperactivity disorder, combined type: Secondary | ICD-10-CM | POA: Diagnosis not present

## 2023-10-29 DIAGNOSIS — R252 Cramp and spasm: Secondary | ICD-10-CM | POA: Diagnosis not present

## 2023-10-29 DIAGNOSIS — M1990 Unspecified osteoarthritis, unspecified site: Secondary | ICD-10-CM | POA: Diagnosis not present

## 2023-10-29 DIAGNOSIS — F331 Major depressive disorder, recurrent, moderate: Secondary | ICD-10-CM | POA: Diagnosis not present

## 2023-10-29 NOTE — Therapy (Signed)
 OUTPATIENT PHYSICAL THERAPY EVALUATION   Patient Name: Angela Dixon MRN: [id number] DOB:11-10-1985, 38 y.o., female Today's Date: 10/29/2023  END OF SESSION:  PT End of Session - 10/29/23 1156     Visit Number 1    Date for PT Re-Evaluation 12/19/23    Authorization Type [employer] - no auth required    PT Start Time 0930    PT Stop Time 1015    PT Time Calculation (min) 45 min    Activity Tolerance Patient tolerated treatment well    Behavior During Therapy [facility] for tasks assessed/performed          Past Medical History:  Diagnosis Date   Abnormal Pap smear    Acid reflux    ADHD    Allergy    Anemia    Anxiety    Cannabinoid hyperemesis syndrome 02/28/2019   Chlamydia 2006   Depression    Fracture    Hypercholesterolemia    IBS (irritable bowel syndrome)    Vitamin D  deficiency    Past Surgical History:  Procedure Laterality Date   COLPOSCOPY     x 2, around 2004 and 2023   WISDOM TOOTH EXTRACTION     Patient Active Problem List   Diagnosis Date Noted   Clinical trial participant 04/04/2023   LFT elevation 10/17/2021   ASCUS of cervix with negative high risk HPV 03/07/2021   B12 deficiency 02/15/2021   Mixed hyperlipidemia 02/13/2021   ADHD 11/17/2020   Elevated BP without diagnosis of hypertension 01/27/2020   Morbid obesity with BMI of 40.0-44.9, adult (HCC) 01/26/2020   Excessive drinking of alcohol 09/29/2018   Stress and adjustment reaction 09/29/2018   Irritable bowel syndrome (IBS) 05/12/2018   GERD (gastroesophageal reflux disease) 01/09/2017   Medication management 01/31/2015   Depression 02/08/2014   Generalized anxiety disorder 02/08/2014   Vitamin D  deficiency     PCP: Billy Philippe SAUNDERS, NP  REFERRING PROVIDER: Billy Philippe SAUNDERS, NP  REFERRING DIAG: M19.90 (ICD-10-CM) - Arthritis  THERAPY DIAG:  Lumbar back pain with radiculopathy affecting left lower extremity  Cervicalgia  Muscle weakness (generalized)  Cramp and  spasm  Other abnormalities of gait and mobility  Rationale for Evaluation and Treatment: Rehabilitation  ONSET DATE: Greater than one year   SUBJECTIVE:                                                                                                                                                                                                         SUBJECTIVE STATEMENT: Pt reports having pain in her left leg  after getting a spine xray finding out she has a degenerative disc at C5 C6. 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   Hand dominance: Right  PERTINENT HISTORY:  OA, DDD, ADHD  PAIN:  Are you having pain? Yes: NPRS scale: 4/10 Pain location: Low back above left leg, traveling down to glute/hamstring area Pain description: Sore Aggravating factors: Sitting at desk for too long; Sitting in general Relieving factors: Movement, stretching, bend leg  PRECAUTIONS: None  RED FLAGS: None     WEIGHT BEARING RESTRICTIONS: No  FALLS:  Has patient fallen in last 6 months? 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  DIAGNOSTIC FINDINGS:  Lumbar Radiograph on 10/22/23: IMPRESSION: 1. No acute finding by plain radiography. 2. Similar T11-12 endplate degenerative changes.  Cervical Radiograph on 10/22/23: IMPRESSION: Slight progression of C5-6 degenerative disc disease. No acute finding by plain radiography.   PATIENT SURVEYS:  LEFS  Extreme difficulty/unable (0), Quite a bit of  difficulty (1), Moderate difficulty (2), Little difficulty (3), No difficulty (4) Survey date:  10/29/23  Any of your usual work, housework or school activities 2  2. Usual hobbies, recreational or sporting activities 3  3. Getting into/out of the bath 3  4. Walking between rooms 3  5. Putting on socks/shoes 3  6. Squatting  4  7. Lifting an object, like a bag of groceries from the floor 3  8. Performing light activities around your home 4  9. Performing heavy activities around your home 2  10. Getting into/out of a car 3  11. Walking 2 blocks 3  12. Walking 1 mile 2  13. Going up/down 10 stairs (1 flight) 3  14. Standing for 1 hour 2  15.  sitting for 1 hour 1  16. Running on even ground 3  17. Running on uneven ground 2  18. Making sharp turns while running fast 2  19. Hopping  3  20. Rolling over in bed 2  Score total:  53/80 = 66.25%     COGNITION: Overall cognitive status: Within functional limits for tasks assessed  SENSATION: Pt reports having occasional numbness/tingling. She reports over 1-2 years ago she experienced numbness in her left toe.   POSTURE: rounded shoulders, forward head, and lumbar lordosis and slight genu recurvatum  PALPATION: Tender to palpation over left iliac crest  Muscle spasm at thoracic paraspinals  CERVICAL ROM:   EVAL: WFL without pain   UPPER EXTREMITY ROM:  EVAL: WFL without pain   LOWER/UPPER EXTREMITY MMT:  Eval: Bilateral UE grossly 4+/5 throughout Right hip strength grossly 4+/5 Left hip strength grossly 4-/5 Bilateral quad strength 4+/5 Right hamstring 4+/5 Left hamstring 4-/5  FLEXIBILITY AND SPECIAL TESTS:  Eval: Slump test positive on the left Hamstring Tightness (left tighter than right)  FUNCTIONAL TESTS:  Eval: Squat Test:  Decreased depth squat, shakiness/unsteadiness Gait Assessment:  Lt side hip instability/shakiness during terminal swing and initial contact during gait cycle Trendelenburg on the  Rt  TREATMENT  DATE:  10/29/23                                                                                                                              Seated hip abduction/clamshells 1x 5 red tband Seated hamstring stretch 1x 20sec Seated marches with red tband 1x5 AROM shoulder retraction 1x 5   PATIENT EDUCATION:  Education details: Issued HEP, Improved posture during exercise Person educated: Patient Education method: Explanation, Demonstration, Tactile cues, Verbal cues, and Handouts Education comprehension: verbalized understanding, returned demonstration, and verbal cues required  HOME EXERCISE PROGRAM: Access Code: [email or web address] URL: [email or web address] Date: 10/29/2023 Prepared by: Jarrell Laming  Exercises - Seated Hip Abduction with Resistance - 1 x daily - 7 x weekly - 2 sets - 10 reps - Seated March with Resistance - 1 x daily - 7 x weekly - 2 sets - 10 reps - Seated Hamstring Stretch - 1 x daily - 7 x weekly - 2 reps - 20 sec hold - Seated Scapular Retraction - 1 x daily - 7 x weekly - 2 sets - 10 reps   ASSESSMENT:  CLINICAL IMPRESSION: Patient is a 38 y.o. female who was seen today for physical therapy evaluation and treatment for arthritis. Pt reports starting to have pain 1-2 years ago as it presents intermittently. Pt received recent radiography confirming degenerative disc disease beginning to form in C5 and C6, however pt complains mostly of left sided pain in low back and glute/hamstring region. Pt is unable to sit at her desk for work without experiencing low back/leg pain. She has difficulty with participation activities like swimming with her children and standing while cooking. Pt had a (+) Slump test today and hamstring tightness with abnormal posture.  Pt was educated on an HEP created toward her specific impairments, and was given demonstrations along with a printed handout of the exercises. Patient would benefit from skilled PT to address  neurological deficits as well as strength and mobility deficits as listed below.  OBJECTIVE IMPAIRMENTS: Abnormal gait, decreased activity tolerance, decreased endurance, decreased knowledge of condition, decreased mobility, decreased ROM, decreased strength, increased muscle spasms, impaired sensation, improper body mechanics, postural dysfunction, and pain.   ACTIVITY LIMITATIONS: bending, sitting, standing, and transfers  PARTICIPATION LIMITATIONS: meal prep, cleaning, driving, community activity, and occupation  PERSONAL FACTORS: Fitness, Past/current experiences, Profession, and 1 comorbidity: OA are also affecting patient's functional outcome.   REHAB POTENTIAL: Good  CLINICAL DECISION MAKING: Stable/uncomplicated  EVALUATION COMPLEXITY: Low   GOALS: Goals reviewed with patient? Yes  SHORT TERM GOALS: Target date: 11/21/23  Pt will be independent with initial HEP to improve mobility and strength.  Baseline:  Goal status: INITIAL  2.  Pt will report pain no greater than 2/10 with sitting at her desk for longer than 30 min.  Baseline:  Goal status: INITIAL  3.  Pt will have negative Trendelenburg sign, indicating improved muscle strength in the Glute medius muscles.  Baseline:  Goal  status: INITIAL   LONG TERM GOALS: Target date: 12/19/2023  Pt will be independent with advanced HEP to allow to self progression after discharge.  Baseline:  Goal status: INITIAL  2.  Pt will improve Lower Extremity Functioning Scale to at least 80% to demonstrate improvement with functional capacity.  Baseline: 66.25% Goal status: INITIAL  3.  Pt will increase left hip muscle strength to [facility] so she improves hip stability for activities such as walking more than 1 mile.  Baseline:  Goal status: INITIAL  4.  Pt will report ability to sit at her desk without pain for over 1 hour so she is able to complete all work tasks pain free.  Baseline:  Goal status: INITIAL  5.  Pt will  demonstrate improved posture and safe body mechanics for everyday functional activities like cleaning.  Baseline:  Goal status: INITIAL  6.  Pt will report that she is able to stand for desired length of time to be able to cook a meal without pain.  Baseline:  Goal status: INITIAL   PLAN:  PT FREQUENCY: 2x/week  PT DURATION: 8 weeks  PLANNED INTERVENTIONS: 97164- PT Re-evaluation, 97110-Therapeutic exercises, 97530- Therapeutic activity, 97112- Neuromuscular re-education, 97535- Self Care, 02859- Manual therapy, [phone]- Gait training, [phone]- Canalith repositioning, [id number]- Aquatic Therapy, [phone]- Electrical stimulation (unattended), [phone]- Electrical stimulation (manual), [id number]- Vasopneumatic device, [id number]- Ultrasound, [id number]- Traction (mechanical), [id number]- Ionotophoresis 4mg /ml Dexamethasone , 79439 (1-2 muscles), [id number] (3+ muscles)- Dry Needling, Patient/Family education, Balance training, Stair training, Taping, Joint mobilization, Joint manipulation, Spinal manipulation, Spinal mobilization, Vestibular training, Cryotherapy, and Moist heat  PLAN FOR NEXT SESSION: Progress hip strengthening exercises, stretching low back and LE muscles, sciatic nerve glides   Lavanda Cleverly, SPT 10/29/2023, 11:58 AM   I agree with the following treatment note after reviewing documentation. This session was performed under the supervision of a licensed clinician. Jarrell Laming, PT, DPT 10/29/23, 12:15 PM  [facility] [address] [place], [place] [address] Phone # [phone] Fax [phone]

## 2023-11-04 ENCOUNTER — Ambulatory Visit (INDEPENDENT_AMBULATORY_CARE_PROVIDER_SITE_OTHER)

## 2023-11-04 DIAGNOSIS — E538 Deficiency of other specified B group vitamins: Secondary | ICD-10-CM | POA: Diagnosis not present

## 2023-11-04 MED ORDER — CYANOCOBALAMIN 1000 MCG/ML IJ SOLN
1000.0000 ug | Freq: Once | INTRAMUSCULAR | Status: AC
  Administered 2023-11-04: 1000 ug via INTRAMUSCULAR

## 2023-11-04 NOTE — Progress Notes (Signed)
 Patient is in office today for a nurse visit for B12 Injection. Patient Injection was given in the  Left deltoid. Patient tolerated injection well.

## 2023-11-05 ENCOUNTER — Ambulatory Visit: Payer: Self-pay | Admitting: Rehabilitative and Restorative Service Providers"

## 2023-11-06 ENCOUNTER — Ambulatory Visit: Payer: Self-pay | Admitting: Physical Therapy

## 2023-11-06 ENCOUNTER — Encounter: Payer: Self-pay | Admitting: Physical Therapy

## 2023-11-06 DIAGNOSIS — M542 Cervicalgia: Secondary | ICD-10-CM

## 2023-11-06 DIAGNOSIS — M5416 Radiculopathy, lumbar region: Secondary | ICD-10-CM | POA: Diagnosis not present

## 2023-11-06 DIAGNOSIS — R2689 Other abnormalities of gait and mobility: Secondary | ICD-10-CM

## 2023-11-06 DIAGNOSIS — M1990 Unspecified osteoarthritis, unspecified site: Secondary | ICD-10-CM | POA: Diagnosis not present

## 2023-11-06 DIAGNOSIS — M6281 Muscle weakness (generalized): Secondary | ICD-10-CM

## 2023-11-06 DIAGNOSIS — R252 Cramp and spasm: Secondary | ICD-10-CM

## 2023-11-06 NOTE — Therapy (Signed)
 OUTPATIENT PHYSICAL THERAPY TREATMENT   Patient Name: Angela Dixon MRN: [id number] DOB:Nov 08, 1985, 38 y.o., female Today's Date: 11/06/2023  END OF SESSION:  PT End of Session - 11/06/23 0933     Visit Number 2    Date for PT Re-Evaluation 12/19/23    Authorization Type [employer]et[employer] - no auth required    PT Start Time 0850    PT Stop Time 0932    PT Time Calculation (min) 42 min    Activity Tolerance Patient tolerated treatment well    Behavior During Therapy [facility] for tasks assessed/performed           Past Medical History:  Diagnosis Date   Abnormal Pap smear    Acid reflux    ADHD    Allergy    Anemia    Anxiety    Cannabinoid hyperemesis syndrome 02/28/2019   Chlamydia 2006   Depression    Fracture    Hypercholesterolemia    IBS (irritable bowel syndrome)    Vitamin D  deficiency    Past Surgical History:  Procedure Laterality Date   COLPOSCOPY     x 2, around 2004 and 2023   WISDOM TOOTH EXTRACTION     Patient Active Problem List   Diagnosis Date Noted   Clinical trial participant 04/04/2023   LFT elevation 10/17/2021   ASCUS of cervix with negative high risk HPV 03/07/2021   B12 deficiency 02/15/2021   Mixed hyperlipidemia 02/13/2021   ADHD 11/17/2020   Elevated BP without diagnosis of hypertension 01/27/2020   Morbid obesity with BMI of 40.0-44.9, adult (HCC) 01/26/2020   Excessive drinking of alcohol 09/29/2018   Stress and adjustment reaction 09/29/2018   Irritable bowel syndrome (IBS) 05/12/2018   GERD (gastroesophageal reflux disease) 01/09/2017   Medication management 01/31/2015   Depression 02/08/2014   Generalized anxiety disorder 02/08/2014   Vitamin D  deficiency     PCP: Billy Philippe SAUNDERS, NP  REFERRING PROVIDER: Billy Philippe SAUNDERS, NP  REFERRING DIAG: M19.90 (ICD-10-CM) - Arthritis  THERAPY DIAG:  Lumbar back pain with radiculopathy affecting left lower extremity  Cervicalgia  Muscle weakness (generalized)  Cramp and  spasm  Other abnormalities of gait and mobility  Rationale for Evaluation and Treatment: Rehabilitation  ONSET DATE: Greater than one year   SUBJECTIVE:                                                                                                                                                                                                         SUBJECTIVE STATEMENT: Patient reports she is doing good today.  She is not currently having any pain. She has been compliant with HEP.  From Eval: Pt reports having pain in her left leg after getting a spine xray finding out she has a degenerative disc at C5 C6. 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   Hand dominance: Right  PERTINENT HISTORY:  OA, DDD, ADHD  PAIN:  Are you having pain? Yes: NPRS scale: 4/10 Pain location: Low back above left leg, traveling down to glute/hamstring area Pain description: Sore Aggravating factors: Sitting at desk for too long; Sitting in general Relieving factors: Movement, stretching, bend leg  PRECAUTIONS: None  RED FLAGS: None     WEIGHT BEARING RESTRICTIONS: No  FALLS:  Has patient fallen in last 6 months? 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  DIAGNOSTIC FINDINGS:  Lumbar Radiograph on 10/22/23: IMPRESSION: 1. No acute finding by plain radiography. 2. Similar T11-12 endplate degenerative changes.  Cervical Radiograph on 10/22/23: IMPRESSION: Slight progression of C5-6 degenerative disc  disease. No acute finding by plain radiography.   PATIENT SURVEYS:  LEFS  Extreme difficulty/unable (0), Quite a bit of difficulty (1), Moderate difficulty (2), Little difficulty (3), No difficulty (4) Survey date:  10/29/23  Any of your usual work, housework or school activities 2  2. Usual hobbies, recreational or sporting activities 3  3. Getting into/out of the bath 3  4. Walking between rooms 3  5. Putting on socks/shoes 3  6. Squatting  4  7. Lifting an object, like a bag of groceries from the floor 3  8. Performing light activities around your home 4  9. Performing heavy activities around your home 2  10. Getting into/out of a car 3  11. Walking 2 blocks 3  12. Walking 1 mile 2  13. Going up/down 10 stairs (1 flight) 3  14. Standing for 1 hour 2  15.  sitting for 1 hour 1  16. Running on even ground 3  17. Running on uneven ground 2  18. Making sharp turns while running fast 2  19. Hopping  3  20. Rolling over in bed 2  Score total:  53/80 = 66.25%     COGNITION: Overall cognitive status: Within functional limits for tasks assessed  SENSATION: Pt reports having occasional numbness/tingling. She reports over 1-2 years ago she experienced numbness in her left toe.   POSTURE: rounded shoulders, forward head, and lumbar lordosis and slight genu recurvatum  PALPATION: Tender to palpation over left iliac crest  Muscle spasm at thoracic paraspinals  CERVICAL ROM:   EVAL: WFL without pain   UPPER EXTREMITY ROM:  EVAL: WFL without pain   LOWER/UPPER EXTREMITY MMT:  Eval: Bilateral UE grossly 4+/5 throughout Right hip strength grossly 4+/5 Left hip strength grossly 4-/5 Bilateral quad strength 4+/5 Right hamstring 4+/5 Left hamstring 4-/5  FLEXIBILITY AND SPECIAL TESTS:  Eval: Slump test positive on the left Hamstring Tightness (left tighter than right)  FUNCTIONAL TESTS:  Eval: Squat Test:  Decreased depth squat,  shakiness/unsteadiness Gait Assessment:   Lt side hip instability/shakiness during terminal swing and initial contact during gait cycle Trendelenburg on the Rt  TREATMENT DATE:  11/06/2023 NuStep Level 3- 5 mins - PT present to discuss status Seated hamstring stretch 2 x 30 sec bilateral  Seated piriformis stretch 2 x 30 sec bilateral  Seated hip abduction with red tb 2 X 10 Seated march with red TB around legs 2 x 10 bilateral  3 way stability ball stretch x 8 each direction  Seated scap retractions 2 x 10 Supine PPT 2 x 10 (verbal cues for correct performance and breathing technique) Alt hand and knee press with purple ball x 10 bilateral  Hooklying TA activation + ball squeeze Sit to stand 7# DB 2 x 10 Standing shoulder abduction with red TB 2 x 10 Standing shoulder ER with red TB 2 x 10   10/29/23                                                                                                                              Seated hip abduction/clamshells 1x 5 red tband Seated hamstring stretch 1x 20sec Seated marches with red tband 1x5 AROM shoulder retraction 1x 5   PATIENT EDUCATION:  Education details: Issued HEP, Improved posture during exercise Person educated: Patient Education method: Explanation, Demonstration, Tactile cues, Verbal cues, and Handouts Education comprehension: verbalized understanding, returned demonstration, and verbal cues required  HOME EXERCISE PROGRAM: Access Code: [email or web address] URL: [email or web address] Date: 11/06/2023 Prepared by: Kristeen Sar  Exercises - Seated Hip Abduction with Resistance  - 1 x daily - 7 x weekly - 2 sets - 10 reps - Seated March with Resistance  - 1 x daily - 7 x weekly - 2 sets - 10 reps - Seated Hamstring Stretch  - 1 x daily - 7 x weekly - 2 reps - 20 sec hold - Seated Scapular Retraction  - 1 x daily - 7 x weekly - 2 sets - 10 reps - Supine Posterior Pelvic Tilt  - 1 x daily - 7 x weekly - 2 sets - 10 reps - 4 hold - Seated Piriformis Stretch with  Trunk Bend  - 1 x daily - 7 x weekly - 2 sets - 20-30 hold - Supine Alternating Knee Taps with Hands  - 1 x daily - 7 x weekly - 1 sets - 10 reps - 3 hold  ASSESSMENT:  CLINICAL IMPRESSION: Tierre presents to first follow up appointment since evaluation. She verbalized compliance to HEP and required minimal verbal cues for form correction. Introduced TA activation today and patient required verbal and visual cues for correct performance and breathing technique. During treatment session patient verbalized feeling tingling in her face. This has been happening more often and she just recently started wegovy. She has an appointment on Monday with [facility]. Overall, patient tolerated treatment session well and should progress well with physical therapy.   OBJECTIVE IMPAIRMENTS: Abnormal gait, decreased activity  tolerance, decreased endurance, decreased knowledge of condition, decreased mobility, decreased ROM, decreased strength, increased muscle spasms, impaired sensation, improper body mechanics, postural dysfunction, and pain.   ACTIVITY LIMITATIONS: bending, sitting, standing, and transfers  PARTICIPATION LIMITATIONS: meal prep, cleaning, driving, community activity, and occupation  PERSONAL FACTORS: Fitness, Past/current experiences, Profession, and 1 comorbidity: OA are also affecting patient's functional outcome.   REHAB POTENTIAL: Good  CLINICAL DECISION MAKING: Stable/uncomplicated  EVALUATION COMPLEXITY: Low   GOALS: Goals reviewed with patient? Yes  SHORT TERM GOALS: Target date: 11/21/23  Pt will be independent with initial HEP to improve mobility and strength.  Baseline:  Goal status: INITIAL  2.  Pt will report pain no greater than 2/10 with sitting at her desk for longer than 30 min.  Baseline:  Goal status: INITIAL  3.  Pt will have negative Trendelenburg sign, indicating improved muscle strength in the Glute medius muscles.  Baseline:  Goal status: INITIAL   LONG  TERM GOALS: Target date: 12/19/2023  Pt will be independent with advanced HEP to allow to self progression after discharge.  Baseline:  Goal status: INITIAL  2.  Pt will improve Lower Extremity Functioning Scale to at least 80% to demonstrate improvement with functional capacity.  Baseline: 66.25% Goal status: INITIAL  3.  Pt will increase left hip muscle strength to [facility] so she improves hip stability for activities such as walking more than 1 mile.  Baseline:  Goal status: INITIAL  4.  Pt will report ability to sit at her desk without pain for over 1 hour so she is able to complete all work tasks pain free.  Baseline:  Goal status: INITIAL  5.  Pt will demonstrate improved posture and safe body mechanics for everyday functional activities like cleaning.  Baseline:  Goal status: INITIAL  6.  Pt will report that she is able to stand for desired length of time to be able to cook a meal without pain.  Baseline:  Goal status: INITIAL   PLAN:  PT FREQUENCY: 2x/week  PT DURATION: 8 weeks  PLANNED INTERVENTIONS: 97164- PT Re-evaluation, 97110-Therapeutic exercises, 97530- Therapeutic activity, 97112- Neuromuscular re-education, 97535- Self Care, 02859- Manual therapy, [id number]- Gait training, [id number]- Canalith repositioning, [id number]- Aquatic Therapy, [phone]- Electrical stimulation (unattended), [phone]- Electrical stimulation (manual), [id number]- Vasopneumatic device, [id number]- Ultrasound, [id number]- Traction (mechanical), [id number]- Ionotophoresis 4mg /ml Dexamethasone , 79439 (1-2 muscles), [id number] (3+ muscles)- Dry Needling, Patient/Family education, Balance training, Stair training, Taping, Joint mobilization, Joint manipulation, Spinal manipulation, Spinal mobilization, Vestibular training, Cryotherapy, and Moist heat  PLAN FOR NEXT SESSION: assess updated HEP; ask about [facility] appointment; continue core and hip strengthening   Kristeen Sar, PT 11/06/23 9:34 AM [facility] [address] [place], [place] [address] Phone # [phone] Fax [phone]

## 2023-11-10 ENCOUNTER — Ambulatory Visit (INDEPENDENT_AMBULATORY_CARE_PROVIDER_SITE_OTHER): Admitting: Physician Assistant

## 2023-11-10 ENCOUNTER — Encounter: Payer: Self-pay | Admitting: Physician Assistant

## 2023-11-10 DIAGNOSIS — R202 Paresthesia of skin: Secondary | ICD-10-CM

## 2023-11-10 NOTE — Progress Notes (Signed)
 Office Visit Note   Patient: Angela Dixon           Date of Birth: November 08, 1985           MRN: [id number] Visit Date: 11/10/2023              Requested by: Billy Philippe SAUNDERS, NP [address] [place],  [place] [address] PCP: Billy Philippe SAUNDERS, NP   Assessment & Plan: Visit Diagnoses:  1. Paresthesia of both feet   2. Paresthesia of both hands   3. Facial paresthesia     Plan:  Given her paresthesias in both hands that do not fit for carpal tunnel syndrome and the other paresthesias she is having in the feet and face.  We will send her to neurology for further evaluation and treatment.  Did discuss that this may be due to her vitamin B12 deficiency.   Follow-Up Instructions: Return if symptoms worsen or fail to improve.   Orders:  Orders Placed This Encounter  Procedures   Ambulatory referral to Neurology   No orders of the defined types were placed in this encounter.     Procedures: No procedures performed   Clinical Data: No additional findings.   Subjective: Chief Complaint  Patient presents with   Lower Back - Pain   Neck - Pain    HPI Patient's 38 year old female were seen for the first time for low back pain that radiates down the left leg she describes a dull achy pain down to the knee.  She does occasionally have numbness in both feet but this is only occasional.  She is currently going to physical therapy and has found this to be beneficial for her low back in the pain down the leg to the knee.  She has had no injury.  She denies any waking pain bowel bladder dysfunction saddle anesthesia fevers or chills.  She also notes that she has numbness in both hands and occasionally the hands lock up.  Right hand is worse than the left.  She states there is no aggravating factors.  Rare tingling of the right arm.  No neck pain.  She did try some gabapentin  with no change in symptoms in her back with radicular symptoms down the left leg or the hands.  She is  also tried Tylenol  and ibuprofen .  She notes some facial numbness at times.  She does have vitamin B12 deficiency and has had 2 injections for this. Radiographs of the cervical spine are reviewed on the canopy system films are from 10/22/2023.  Multiple views of the C-spine shows no acute fractures.  There is minimal degenerative changes at C5-C6 with anterior vertebral body spurring.  Otherwise no spondylolisthesis no acute findings.  Slight loss of lordotic curvature. L-spine multiple views: Shows no spondylolisthesis no acute fractures acute findings.  Disc base overall well-maintained.  Normal lordotic curvature.  Review of Systems  Constitutional:  Negative for chills, fatigue and fever.  Eyes:  Negative for visual disturbance.  Musculoskeletal:  Positive for back pain.  Neurological:  Positive for numbness. Negative for headaches.     Objective: Vital Signs: There were no vitals taken for this visit.  Physical Exam Constitutional:      Appearance: She is not ill-appearing or diaphoretic.  Cardiovascular:     Pulses: Normal pulses.  Pulmonary:     Effort: Pulmonary effort is normal.  Neurological:     Mental Status: She is alert and oriented to person, place, and time.  Psychiatric:  Mood and Affect: Mood normal.     Ortho Exam Cervical spine: Good range of motion without pain.  Negative Spurling's negative Lhermitte's sign.  Nontender trapezius region and medial border of scapula bilaterally. Upper extremities: 5 out of 5 strength throughout the upper extremities against resistance.  Sensation grossly intact bilateral hands to light touch.  Full motor bilateral hands.  Negative Tinel's at the elbow over the ulnar nerve bilaterally.  Negative Tinel's at the wrist over the median nerve bilaterally.  Negative compression test over the median nerve bilaterally.  Phalanxes positive bilaterally.  No rashes skin lesions either hand.  Radial pulses are 2+ and equal symmetric. Lower  extremities: 5-5 strength throughout.  Tight hamstrings bilaterally negative straight leg raise bilaterally.  Bilateral feet dorsal pedal 2+ and equal symmetric.  Sensation grossly intact feet to light touch.  No rashes skin lesions or ulcerations either foot. Lumbar spine: Tenderness over the lower left lumbar sacral paraspinous region.  Full flexion extension lumbar spine without pain. Specialty Comments:  No specialty comments available.  Imaging: No results found.   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/04/2023   LFT elevation 10/17/2021   ASCUS of cervix with negative high risk HPV 03/07/2021   B12 deficiency 02/15/2021   Mixed hyperlipidemia 02/13/2021   ADHD 11/17/2020   Elevated BP without diagnosis of hypertension 01/27/2020   Morbid obesity with BMI of 40.0-44.9, adult (HCC) 01/26/2020   Excessive drinking of alcohol 09/29/2018   Stress and adjustment reaction 09/29/2018   Irritable bowel syndrome (IBS) 05/12/2018   GERD (gastroesophageal reflux disease) 01/09/2017   Medication management 01/31/2015   Depression 02/08/2014   Generalized anxiety disorder 02/08/2014   Vitamin D  deficiency    Past Medical History:  Diagnosis Date   Abnormal Pap smear    Acid reflux    ADHD    Allergy    Anemia    Anxiety    Cannabinoid hyperemesis syndrome 02/28/2019   Chlamydia 2006   Depression    Fracture    Hypercholesterolemia    IBS (irritable bowel syndrome)    Vitamin D  deficiency     Family History  Problem Relation Age of Onset   Hyperlipidemia Mother    Osteopenia Mother    Alcohol abuse Mother    Cancer Mother        Lung   COPD Mother    Diverticulitis Father    Alcohol abuse Father    Asthma Sister    Hypertension Maternal Grandmother    Diabetes Maternal Grandmother    Heart disease Maternal Grandfather    Cancer Maternal Grandfather    COPD Maternal Grandfather    Cancer Paternal Grandmother         throat, smoker   Cancer Paternal Grandfather        lung, smoker   COPD Paternal Grandfather    Colon cancer Neg Hx    Esophageal cancer Neg Hx    Stomach cancer Neg Hx    Rectal cancer Neg Hx     Past Surgical History:  Procedure Laterality Date   COLPOSCOPY     x 2, around 2004 and 2023   WISDOM TOOTH EXTRACTION     Social History   Occupational History   Not on file  Tobacco Use   Smoking status: Former    Current packs/day: 0.00    Average packs/day: 1 pack/day for 3.0 years (3.0 ttl pk-yrs)    Types:  Cigarettes    Quit date: 07/09/2006    Years since quitting: 17.3   Smokeless tobacco: Never  Vaping Use   Vaping status: Never Used  Substance and Sexual Activity   Alcohol use: Yes    Alcohol/week: 3.0 - 4.0 standard drinks of alcohol    Types: 3 - 4 Glasses of wine per week   Drug use: Not Currently    Types: Marijuana    Comment: last 05/2019   Sexual activity: Not Currently    Partners: Male    Birth control/protection: Condom, I.U.D.    Comment: condoms intermittent, lifetime partners 4

## 2023-11-11 ENCOUNTER — Ambulatory Visit (INDEPENDENT_AMBULATORY_CARE_PROVIDER_SITE_OTHER)

## 2023-11-11 ENCOUNTER — Encounter: Payer: Self-pay | Admitting: Family Medicine

## 2023-11-11 DIAGNOSIS — E538 Deficiency of other specified B group vitamins: Secondary | ICD-10-CM | POA: Diagnosis not present

## 2023-11-11 MED ORDER — CYANOCOBALAMIN 1000 MCG/ML IJ SOLN
1000.0000 ug | Freq: Once | INTRAMUSCULAR | Status: AC
  Administered 2023-11-11: 1000 ug via INTRAMUSCULAR

## 2023-11-11 NOTE — Progress Notes (Signed)
 Patient is in office today for a nurse visit for B12 Injection. Patient Injection was given in the  Right deltoid. Patient tolerated injection well.

## 2023-11-12 ENCOUNTER — Ambulatory Visit: Payer: Self-pay | Admitting: Physical Therapy

## 2023-11-14 DIAGNOSIS — E669 Obesity, unspecified: Secondary | ICD-10-CM | POA: Diagnosis not present

## 2023-11-18 ENCOUNTER — Encounter

## 2023-11-20 ENCOUNTER — Encounter: Admitting: Physical Therapy

## 2023-11-20 DIAGNOSIS — F411 Generalized anxiety disorder: Secondary | ICD-10-CM | POA: Diagnosis not present

## 2023-11-20 DIAGNOSIS — F331 Major depressive disorder, recurrent, moderate: Secondary | ICD-10-CM | POA: Diagnosis not present

## 2023-11-20 DIAGNOSIS — F902 Attention-deficit hyperactivity disorder, combined type: Secondary | ICD-10-CM | POA: Diagnosis not present

## 2023-11-20 DIAGNOSIS — F319 Bipolar disorder, unspecified: Secondary | ICD-10-CM | POA: Diagnosis not present

## 2023-11-21 ENCOUNTER — Ambulatory Visit (INDEPENDENT_AMBULATORY_CARE_PROVIDER_SITE_OTHER)

## 2023-11-21 DIAGNOSIS — E538 Deficiency of other specified B group vitamins: Secondary | ICD-10-CM

## 2023-11-21 MED ORDER — CYANOCOBALAMIN 1000 MCG/ML IJ SOLN
1000.0000 ug | Freq: Once | INTRAMUSCULAR | Status: AC
  Administered 2023-11-21: 1000 ug via INTRAMUSCULAR

## 2023-11-21 NOTE — Progress Notes (Signed)
 Patient is in office today for a nurse visit for B12 Injection. Patient Injection was given in the  Left deltoid. Patient tolerated injection well.

## 2023-11-25 ENCOUNTER — Ambulatory Visit: Attending: Family Medicine

## 2023-11-25 DIAGNOSIS — R2689 Other abnormalities of gait and mobility: Secondary | ICD-10-CM | POA: Insufficient documentation

## 2023-11-25 DIAGNOSIS — M6281 Muscle weakness (generalized): Secondary | ICD-10-CM | POA: Insufficient documentation

## 2023-11-25 DIAGNOSIS — R252 Cramp and spasm: Secondary | ICD-10-CM | POA: Insufficient documentation

## 2023-11-25 DIAGNOSIS — M1990 Unspecified osteoarthritis, unspecified site: Secondary | ICD-10-CM | POA: Insufficient documentation

## 2023-11-25 DIAGNOSIS — M5416 Radiculopathy, lumbar region: Secondary | ICD-10-CM | POA: Insufficient documentation

## 2023-11-25 DIAGNOSIS — M542 Cervicalgia: Secondary | ICD-10-CM | POA: Insufficient documentation

## 2023-11-27 ENCOUNTER — Encounter

## 2023-11-27 ENCOUNTER — Other Ambulatory Visit: Payer: Self-pay | Admitting: Physician Assistant

## 2023-11-30 DIAGNOSIS — F902 Attention-deficit hyperactivity disorder, combined type: Secondary | ICD-10-CM | POA: Diagnosis not present

## 2023-11-30 DIAGNOSIS — F411 Generalized anxiety disorder: Secondary | ICD-10-CM | POA: Diagnosis not present

## 2023-11-30 DIAGNOSIS — F331 Major depressive disorder, recurrent, moderate: Secondary | ICD-10-CM | POA: Diagnosis not present

## 2023-11-30 DIAGNOSIS — F319 Bipolar disorder, unspecified: Secondary | ICD-10-CM | POA: Diagnosis not present

## 2023-12-02 ENCOUNTER — Ambulatory Visit

## 2023-12-04 ENCOUNTER — Encounter

## 2023-12-09 ENCOUNTER — Encounter

## 2023-12-11 ENCOUNTER — Encounter: Admitting: Physical Therapy

## 2023-12-12 ENCOUNTER — Encounter: Payer: Self-pay | Admitting: Diagnostic Neuroimaging

## 2023-12-12 ENCOUNTER — Ambulatory Visit: Admitting: Diagnostic Neuroimaging

## 2023-12-12 VITALS — BP 124/75 | HR 75 | Ht 64.0 in | Wt 189.0 lb

## 2023-12-12 DIAGNOSIS — M62838 Other muscle spasm: Secondary | ICD-10-CM

## 2023-12-12 DIAGNOSIS — R200 Anesthesia of skin: Secondary | ICD-10-CM

## 2023-12-12 NOTE — Patient Instructions (Signed)
-   check MRI brain / cervical spine w/wo  - continue B12 replacement per PCP

## 2023-12-12 NOTE — Progress Notes (Signed)
 [facility]  PATIENT: Angela Dixon DOB: 1985-09-25  REFERRING CLINICIAN: Gretta Bertrum ORN, PA-C HISTORY FROM: patient REASON FOR VISIT: new consult   HISTORICAL  CHIEF COMPLAINT:  Chief Complaint  Patient presents with   Numbness    Rm 6 alone Pt is well, reports numbness, tingling and muscle tightness of bilateral hands, feet and face. Symptoms have been persistent for 1-2 yrs. No onset triggers she is aware of.     HISTORY OF PRESENT ILLNESS:   38 year old female with numbness and tingling.  Past years patient has had onset of numbness in the angers and hands associated with intermittent stiffness lasting minutes or hours at a time.  This may have been a few times a week.  Has also had episodes of facial tingling, affecting the mouth and lips.  Has also had sensation in her feet and legs.  No specific triggering or aggravating factors.   REVIEW OF SYSTEMS: Full 14 system review of systems performed and negative with exception of: As per HPI.  ALLERGIES: Allergies  Allergen Reactions   Wellbutrin  [Bupropion ] Hives   Zoloft  [Sertraline ] Other (See Comments)    Excessive sweating, insomnia   Neomycin Rash    HOME MEDICATIONS: Outpatient Medications Prior to Visit  Medication Sig Dispense Refill   albuterol  (VENTOLIN  HFA) 108 (90 Base) MCG/ACT inhaler Inhale 2 puffs into the lungs every 4 (four) hours as needed for wheezing or shortness of breath. 3 each 0   amphetamine-dextroamphetamine (ADDERALL XR) 30 MG 24 hr capsule TAKE 1 CAPSULE BY MOUTH DAILY HAVING TO CHANGE ONLY BECAUSE OF NATIONAL SHORTAGE     amphetamine-dextroamphetamine (ADDERALL) 10 MG tablet Take 10 mg by mouth daily.     ARIPiprazole (ABILIFY) 5 MG tablet Take 5 mg by mouth daily.     busPIRone  (BUSPAR ) 15 MG tablet Take 1 tablet (15 mg total) by mouth 2 (two) times daily. 60 tablet 2   cetirizine (ZYRTEC) 10 MG tablet Take 10 mg by mouth daily.     FLUoxetine  (PROZAC ) 40 MG capsule  Take 40 mg by mouth daily.     folic acid (FOLVITE) 1 MG tablet Take 1 mg by mouth daily.     levonorgestrel (MIRENA, 52 MG,) 20 MCG/DAY IUD 1 each by Intrauterine route once.     pantoprazole  (PROTONIX ) 40 MG tablet TAKE 1 TABLET (40 MG TOTAL) BY MOUTH TWICE A DAY BEFORE MEALS 60 tablet 2   FLUoxetine  (PROZAC ) 10 MG capsule Take 10 mg by mouth daily. (Patient not taking: Reported on 12/12/2023)     FLUoxetine  (PROZAC ) 20 MG capsule Take 20 mg by mouth daily. (Patient not taking: Reported on 12/12/2023)     gabapentin  (NEURONTIN ) 100 MG capsule Take 1 capsule (100 mg total) by mouth 3 (three) times daily for 7 days. (Patient not taking: Reported on 12/12/2023) 21 capsule 0   No facility-administered medications prior to visit.    PAST MEDICAL HISTORY: Past Medical History:  Diagnosis Date   Abnormal Pap smear    Acid reflux    ADHD    Allergy    Anemia    Anxiety    Cannabinoid hyperemesis syndrome 02/28/2019   Chlamydia 2006   Depression    Fracture    Hypercholesterolemia    IBS (irritable bowel syndrome)    Vitamin D  deficiency     PAST SURGICAL HISTORY: Past Surgical History:  Procedure Laterality Date   COLPOSCOPY     x 2, around 2004 and 2023   WISDOM  TOOTH EXTRACTION      FAMILY HISTORY: Family History  Problem Relation Age of Onset   Hyperlipidemia Mother    Osteopenia Mother    Alcohol abuse Mother    Cancer Mother        Lung   COPD Mother    Diverticulitis Father    Alcohol abuse Father    Asthma Sister    Hypertension Maternal Grandmother    Diabetes Maternal Grandmother    Heart disease Maternal Grandfather    Cancer Maternal Grandfather    COPD Maternal Grandfather    Cancer Paternal Grandmother        throat, smoker   Cancer Paternal Grandfather        lung, smoker   COPD Paternal Grandfather    Colon cancer Neg Hx    Esophageal cancer Neg Hx    Stomach cancer Neg Hx    Rectal cancer Neg Hx     SOCIAL HISTORY: Social History   Socioeconomic  History   Marital status: Divorced    Spouse name: Not on file   Number of children: 2   Years of education: Not on file   Highest education level: Master's degree (e.g., MA, MS, MEng, MEd, MSW, MBA)  Occupational History   Not on file  Tobacco Use   Smoking status: Former    Current packs/day: 0.00    Average packs/day: 1 pack/day for 3.0 years (3.0 ttl pk-yrs)    Types: Cigarettes    Quit date: 07/09/2006    Years since quitting: 17.4   Smokeless tobacco: Never  Vaping Use   Vaping status: Never Used  Substance and Sexual Activity   Alcohol use: Yes    Alcohol/week: 3.0 - 4.0 standard drinks of alcohol    Types: 3 - 4 Glasses of wine per week   Drug use: Not Currently    Types: Marijuana    Comment: last 05/2019   Sexual activity: Not Currently    Partners: Male    Birth control/protection: Condom, I.U.D.    Comment: condoms intermittent, lifetime partners 74  Other Topics Concern   Not on file  Social History Narrative   Not on file   Social Drivers of Health   Financial Resource Strain: Low Risk  (10/21/2023)   Overall Financial Resource Strain (CARDIA)    Difficulty of Paying Living Expenses: Not hard at all  Food Insecurity: No Food Insecurity (10/21/2023)   Hunger Vital Sign    Worried About Running Out of Food in the Last Year: Never true    Ran Out of Food in the Last Year: Never true  Transportation Needs: No Transportation Needs (10/21/2023)   PRAPARE - Administrator, Civil Service (Medical): No    Lack of Transportation (Non-Medical): No  Physical Activity: Insufficiently Active (10/21/2023)   Exercise Vital Sign    Days of Exercise per Week: 4 days    Minutes of Exercise per Session: 20 min  Stress: Stress Concern Present (10/21/2023)   [employer] of Occupational Health - Occupational Stress Questionnaire    Feeling of Stress: To some extent  Social Connections: Socially Isolated (10/21/2023)   Social Connection and Isolation Panel     Frequency of Communication with Friends and Family: Twice a week    Frequency of Social Gatherings with Friends and Family: More than three times a week    Attends Religious Services: Never    Database administrator or Organizations: No    Attends Club or  Organization Meetings: Not on file    Marital Status: Divorced  Intimate Partner Violence: Not At Risk (10/22/2023)   Humiliation, Afraid, Rape, and Kick questionnaire    Fear of Current or Ex-Partner: No    Emotionally Abused: No    Physically Abused: No    Sexually Abused: No     PHYSICAL EXAM  GENERAL EXAM/CONSTITUTIONAL: Vitals:  Vitals:   12/12/23 1027  BP: 124/75  Pulse: 75  Weight: 189 lb (85.7 kg)  Height: 5' 4 (1.626 m)   Body mass index is 32.44 kg/m. Wt Readings from Last 3 Encounters:  12/12/23 189 lb (85.7 kg)  10/22/23 188 lb (85.3 kg)  02/24/23 199 lb 6.4 oz (90.4 kg)   Patient is in no distress; well developed, nourished and groomed; neck is supple  CARDIOVASCULAR: Examination of carotid arteries is normal; no carotid bruits Regular rate and rhythm, no murmurs Examination of peripheral vascular system by observation and palpation is normal  EYES: Ophthalmoscopic exam of optic discs and posterior segments is normal; no papilledema or hemorrhages No results found.  MUSCULOSKELETAL: Gait, strength, tone, movements noted in Neurologic exam below  NEUROLOGIC: MENTAL STATUS:      No data to display         awake, alert, oriented to person, place and time recent and remote memory intact normal attention and concentration language fluent, comprehension intact, naming intact fund of knowledge appropriate  CRANIAL NERVE:  2nd - no papilledema on fundoscopic exam 2nd, 3rd, 4th, 6th - pupils equal and reactive to light, visual fields full to confrontation, extraocular muscles intact, no nystagmus 5th - facial sensation symmetric 7th - facial strength symmetric 8th - hearing intact 9th - palate  elevates symmetrically, uvula midline 11th - shoulder shrug symmetric 12th - tongue protrusion midline  MOTOR:  normal bulk and tone, full strength in the BUE, BLE  SENSORY:  normal and symmetric to light touch, temperature, vibration  COORDINATION:  finger-nose-finger, fine finger movements normal  REFLEXES:  deep tendon reflexes present and symmetric; EXCEPT SLIGHTLY INCREASED IN RUE; NEG HOFFMANS  GAIT/STATION:  narrow based gait     DIAGNOSTIC DATA (LABS, IMAGING, TESTING) - I reviewed patient records, labs, notes, testing and imaging myself where available.  Lab Results  Component Value Date   WBC 6.2 02/24/2023   HGB 14.8 02/24/2023   HCT 45.0 02/24/2023   MCV 89.8 02/24/2023   PLT 277 02/24/2023      Component Value Date/Time   NA 137 10/23/2023 0838   K 3.5 10/23/2023 0838   CL 100 10/23/2023 0838   CO2 25 10/23/2023 0838   GLUCOSE 98 10/23/2023 0838   BUN 10 10/23/2023 0838   CREATININE 0.74 10/23/2023 0838   CREATININE 0.73 02/24/2023 1453   CALCIUM 9.6 10/23/2023 0838   PROT 7.5 10/23/2023 0838   ALBUMIN 4.7 10/23/2023 0838   AST 24 10/23/2023 0838   ALT 14 10/23/2023 0838   ALKPHOS 47 10/23/2023 0838   BILITOT 0.7 10/23/2023 0838   GFRNONAA 52 (L) 07/09/2020 0835   GFRNONAA 114 01/27/2020 1054   GFRAA 133 01/27/2020 1054   Lab Results  Component Value Date   CHOL 256 (H) 10/23/2023   HDL 90.50 10/23/2023   LDLCALC 142 (H) 10/23/2023   TRIG 115.0 10/23/2023   CHOLHDL 3 10/23/2023   Lab Results  Component Value Date   HGBA1C 5.1 10/23/2023   Lab Results  Component Value Date   VITAMINB12 168 (L) 10/23/2023   Lab Results  Component  Value Date   TSH 3.12 08/05/2023      ASSESSMENT AND PLAN  38 y.o. year old female here with:   Dx:  1. Numbness   2. Muscle spasm     PLAN:  INTERMITTENT NUMBNESS (hands, feet, face) + muscle spasm / stiffness (hands) --> since ~2023 - check MRI brain / cervical spine w/wo (evaluate for  demyelinating, autoimmune, inflamm workup) - continue B12 replacement per PCP  Orders Placed This Encounter  Procedures   MR BRAIN W WO CONTRAST   MR CERVICAL SPINE W WO CONTRAST   Return for pending test results, pending if symptoms worsen or fail to improve.  I reviewed labs, notes, records myself. I summarized findings and reviewed with patient, for this high risk condition (new onset numbness, muscle spasm; demyelinating dz workup) requiring high complexity decision making.    EDUARD FABIENE HANLON, MD 12/12/2023, 10:52 AM Certified in Neurology, Neurophysiology and Neuroimaging  [facility] [address] [place], [place] [address] [phone]

## 2023-12-15 ENCOUNTER — Telehealth: Payer: Self-pay | Admitting: Diagnostic Neuroimaging

## 2023-12-15 DIAGNOSIS — E669 Obesity, unspecified: Secondary | ICD-10-CM | POA: Diagnosis not present

## 2023-12-15 NOTE — Telephone Encounter (Signed)
 sent to GI they obtain Drucie Opitz [phone]

## 2023-12-16 ENCOUNTER — Encounter

## 2023-12-18 ENCOUNTER — Encounter

## 2023-12-22 ENCOUNTER — Ambulatory Visit

## 2023-12-22 DIAGNOSIS — F331 Major depressive disorder, recurrent, moderate: Secondary | ICD-10-CM | POA: Diagnosis not present

## 2023-12-22 DIAGNOSIS — F319 Bipolar disorder, unspecified: Secondary | ICD-10-CM | POA: Diagnosis not present

## 2023-12-22 DIAGNOSIS — F411 Generalized anxiety disorder: Secondary | ICD-10-CM | POA: Diagnosis not present

## 2023-12-22 DIAGNOSIS — F902 Attention-deficit hyperactivity disorder, combined type: Secondary | ICD-10-CM | POA: Diagnosis not present

## 2023-12-28 ENCOUNTER — Emergency Department (HOSPITAL_BASED_OUTPATIENT_CLINIC_OR_DEPARTMENT_OTHER)

## 2023-12-28 ENCOUNTER — Encounter (HOSPITAL_BASED_OUTPATIENT_CLINIC_OR_DEPARTMENT_OTHER): Payer: Self-pay | Admitting: Emergency Medicine

## 2023-12-28 ENCOUNTER — Other Ambulatory Visit: Payer: Self-pay

## 2023-12-28 ENCOUNTER — Inpatient Hospital Stay (HOSPITAL_BASED_OUTPATIENT_CLINIC_OR_DEPARTMENT_OTHER)
Admission: EM | Admit: 2023-12-28 | Discharge: 2024-01-01 | DRG: 683 | Disposition: A | Discharge status: Home / Self Care | Attending: Internal Medicine | Admitting: Internal Medicine

## 2023-12-28 DIAGNOSIS — D72829 Elevated white blood cell count, unspecified: Secondary | ICD-10-CM | POA: Diagnosis not present

## 2023-12-28 DIAGNOSIS — D751 Secondary polycythemia: Secondary | ICD-10-CM | POA: Diagnosis present

## 2023-12-28 DIAGNOSIS — R9431 Abnormal electrocardiogram [ECG] [EKG]: Secondary | ICD-10-CM | POA: Diagnosis present

## 2023-12-28 DIAGNOSIS — F1010 Alcohol abuse, uncomplicated: Secondary | ICD-10-CM | POA: Diagnosis present

## 2023-12-28 DIAGNOSIS — F909 Attention-deficit hyperactivity disorder, unspecified type: Secondary | ICD-10-CM | POA: Diagnosis present

## 2023-12-28 DIAGNOSIS — K589 Irritable bowel syndrome without diarrhea: Secondary | ICD-10-CM | POA: Diagnosis present

## 2023-12-28 DIAGNOSIS — Z975 Presence of (intrauterine) contraceptive device: Secondary | ICD-10-CM | POA: Diagnosis not present

## 2023-12-28 DIAGNOSIS — E860 Dehydration: Secondary | ICD-10-CM | POA: Diagnosis present

## 2023-12-28 DIAGNOSIS — N289 Disorder of kidney and ureter, unspecified: Secondary | ICD-10-CM | POA: Diagnosis not present

## 2023-12-28 DIAGNOSIS — K580 Irritable bowel syndrome with diarrhea: Secondary | ICD-10-CM | POA: Diagnosis not present

## 2023-12-28 DIAGNOSIS — Z801 Family history of malignant neoplasm of trachea, bronchus and lung: Secondary | ICD-10-CM | POA: Diagnosis not present

## 2023-12-28 DIAGNOSIS — D75839 Thrombocytosis, unspecified: Secondary | ICD-10-CM | POA: Insufficient documentation

## 2023-12-28 DIAGNOSIS — F411 Generalized anxiety disorder: Secondary | ICD-10-CM | POA: Diagnosis present

## 2023-12-28 DIAGNOSIS — F331 Major depressive disorder, recurrent, moderate: Secondary | ICD-10-CM | POA: Diagnosis not present

## 2023-12-28 DIAGNOSIS — Z881 Allergy status to other antibiotic agents status: Secondary | ICD-10-CM

## 2023-12-28 DIAGNOSIS — Z811 Family history of alcohol abuse and dependence: Secondary | ICD-10-CM | POA: Diagnosis not present

## 2023-12-28 DIAGNOSIS — E8729 Other acidosis: Secondary | ICD-10-CM | POA: Diagnosis not present

## 2023-12-28 DIAGNOSIS — Z825 Family history of asthma and other chronic lower respiratory diseases: Secondary | ICD-10-CM | POA: Diagnosis not present

## 2023-12-28 DIAGNOSIS — Z6841 Body Mass Index (BMI) 40.0 and over, adult: Secondary | ICD-10-CM | POA: Diagnosis not present

## 2023-12-28 DIAGNOSIS — Z79899 Other long term (current) drug therapy: Secondary | ICD-10-CM

## 2023-12-28 DIAGNOSIS — Z833 Family history of diabetes mellitus: Secondary | ICD-10-CM

## 2023-12-28 DIAGNOSIS — F1290 Cannabis use, unspecified, uncomplicated: Secondary | ICD-10-CM | POA: Diagnosis present

## 2023-12-28 DIAGNOSIS — Z888 Allergy status to other drugs, medicaments and biological substances status: Secondary | ICD-10-CM

## 2023-12-28 DIAGNOSIS — R1013 Epigastric pain: Secondary | ICD-10-CM | POA: Diagnosis not present

## 2023-12-28 DIAGNOSIS — Z87891 Personal history of nicotine dependence: Secondary | ICD-10-CM

## 2023-12-28 DIAGNOSIS — R112 Nausea with vomiting, unspecified: Secondary | ICD-10-CM | POA: Diagnosis present

## 2023-12-28 DIAGNOSIS — K219 Gastro-esophageal reflux disease without esophagitis: Secondary | ICD-10-CM | POA: Diagnosis not present

## 2023-12-28 DIAGNOSIS — E876 Hypokalemia: Secondary | ICD-10-CM | POA: Diagnosis not present

## 2023-12-28 DIAGNOSIS — E872 Acidosis, unspecified: Secondary | ICD-10-CM | POA: Diagnosis present

## 2023-12-28 DIAGNOSIS — E782 Mixed hyperlipidemia: Secondary | ICD-10-CM | POA: Diagnosis not present

## 2023-12-28 DIAGNOSIS — Z8349 Family history of other endocrine, nutritional and metabolic diseases: Secondary | ICD-10-CM

## 2023-12-28 DIAGNOSIS — F32A Depression, unspecified: Secondary | ICD-10-CM | POA: Diagnosis present

## 2023-12-28 DIAGNOSIS — N179 Acute kidney failure, unspecified: Secondary | ICD-10-CM | POA: Diagnosis not present

## 2023-12-28 DIAGNOSIS — Z8249 Family history of ischemic heart disease and other diseases of the circulatory system: Secondary | ICD-10-CM

## 2023-12-28 LAB — CBC WITH DIFFERENTIAL/PLATELET
Abs Immature Granulocytes: 0.09 K/uL — ABNORMAL HIGH (ref 0.00–0.07)
Basophils Absolute: 0 K/uL (ref 0.0–0.1)
Basophils Relative: 0 %
Eosinophils Absolute: 0 K/uL (ref 0.0–0.5)
Eosinophils Relative: 0 %
HCT: 46.9 % — ABNORMAL HIGH (ref 36.0–46.0)
Hemoglobin: 17.2 g/dL — ABNORMAL HIGH (ref 12.0–15.0)
Immature Granulocytes: 1 %
Lymphocytes Relative: 9 %
Lymphs Abs: 1.6 K/uL (ref 0.7–4.0)
MCH: 30.4 pg (ref 26.0–34.0)
MCHC: 36.7 g/dL — ABNORMAL HIGH (ref 30.0–36.0)
MCV: 82.9 fL (ref 80.0–100.0)
Monocytes Absolute: 1.4 K/uL — ABNORMAL HIGH (ref 0.1–1.0)
Monocytes Relative: 8 %
Neutro Abs: 15.8 K/uL — ABNORMAL HIGH (ref 1.7–7.7)
Neutrophils Relative %: 82 %
Platelets: 414 K/uL — ABNORMAL HIGH (ref 150–400)
RBC: 5.66 MIL/uL — ABNORMAL HIGH (ref 3.87–5.11)
RDW: 12.3 % (ref 11.5–15.5)
WBC: 18.9 K/uL — ABNORMAL HIGH (ref 4.0–10.5)
nRBC: 0 % (ref 0.0–0.2)

## 2023-12-28 LAB — URINALYSIS, ROUTINE W REFLEX MICROSCOPIC
Glucose, UA: NEGATIVE mg/dL
Ketones, ur: 15 mg/dL — AB
Leukocytes,Ua: NEGATIVE
Nitrite: NEGATIVE
Protein, ur: >300 mg/dL — AB
Specific Gravity, Urine: 1.021 (ref 1.005–1.030)
pH: 6 (ref 5.0–8.0)

## 2023-12-28 LAB — COMPREHENSIVE METABOLIC PANEL WITH GFR
ALT: 29 U/L (ref 0–44)
AST: 48 U/L — ABNORMAL HIGH (ref 15–41)
Albumin: 7.6 g/dL — ABNORMAL HIGH (ref 3.5–5.0)
Alkaline Phosphatase: 67 U/L (ref 38–126)
Anion gap: 30 — ABNORMAL HIGH (ref 5–15)
BUN: 34 mg/dL — ABNORMAL HIGH (ref 6–20)
CO2: 20 mmol/L — ABNORMAL LOW (ref 22–32)
Calcium: 11.5 mg/dL — ABNORMAL HIGH (ref 8.9–10.3)
Chloride: 90 mmol/L — ABNORMAL LOW (ref 98–111)
Creatinine, Ser: 3.53 mg/dL — ABNORMAL HIGH (ref 0.44–1.00)
GFR, Estimated: 16 mL/min — ABNORMAL LOW (ref >60)
Glucose, Bld: 131 mg/dL — ABNORMAL HIGH (ref 70–99)
Potassium: 2.8 mmol/L — ABNORMAL LOW (ref 3.5–5.1)
Sodium: 139 mmol/L (ref 135–145)
Total Bilirubin: 0.7 mg/dL (ref 0.0–1.2)
Total Protein: 10.3 g/dL — ABNORMAL HIGH (ref 6.5–8.1)

## 2023-12-28 LAB — BASIC METABOLIC PANEL WITH GFR
Anion gap: 23 — ABNORMAL HIGH (ref 5–15)
BUN: 26 mg/dL — ABNORMAL HIGH (ref 6–20)
CO2: 19 mmol/L — ABNORMAL LOW (ref 22–32)
Calcium: 9.3 mg/dL (ref 8.9–10.3)
Chloride: 99 mmol/L (ref 98–111)
Creatinine, Ser: 1.94 mg/dL — ABNORMAL HIGH (ref 0.44–1.00)
GFR, Estimated: 33 mL/min — ABNORMAL LOW (ref >60)
Glucose, Bld: 108 mg/dL — ABNORMAL HIGH (ref 70–99)
Potassium: 3.2 mmol/L — ABNORMAL LOW (ref 3.5–5.1)
Sodium: 141 mmol/L (ref 135–145)

## 2023-12-28 LAB — SODIUM, URINE, RANDOM: Sodium, Ur: <30 mmol/L

## 2023-12-28 LAB — MAGNESIUM
Magnesium: 1.3 mg/dL — ABNORMAL LOW (ref 1.7–2.4)
Magnesium: 2.5 mg/dL — ABNORMAL HIGH (ref 1.7–2.4)

## 2023-12-28 LAB — BETA-HYDROXYBUTYRIC ACID: Beta-Hydroxybutyric Acid: 2.26 mmol/L — ABNORMAL HIGH (ref 0.05–0.27)

## 2023-12-28 LAB — LIPASE, BLOOD: Lipase: 28 U/L (ref 11–51)

## 2023-12-28 LAB — CREATININE, URINE, RANDOM: Creatinine, Urine: 280 mg/dL

## 2023-12-28 LAB — HIV ANTIBODY (ROUTINE TESTING W REFLEX): HIV Screen 4th Generation wRfx: NONREACTIVE

## 2023-12-28 LAB — HCG, SERUM, QUALITATIVE: Preg, Serum: NEGATIVE

## 2023-12-28 LAB — LACTIC ACID, PLASMA: Lactic Acid, Venous: 1.2 mmol/L (ref 0.5–1.9)

## 2023-12-28 LAB — OSMOLALITY: Osmolality: 309 mosm/kg — ABNORMAL HIGH (ref 275–295)

## 2023-12-28 LAB — OSMOLALITY, URINE: Osmolality, Ur: 486 mosm/kg (ref 300–900)

## 2023-12-28 MED ORDER — FLUOXETINE HCL 20 MG PO CAPS
40.0000 mg | ORAL_CAPSULE | Freq: Every day | ORAL | Status: DC
  Administered 2023-12-29 – 2024-01-01 (×4): 40 mg via ORAL
  Filled 2023-12-28 (×4): qty 2

## 2023-12-28 MED ORDER — SODIUM CHLORIDE 0.9% FLUSH
3.0000 mL | Freq: Two times a day (BID) | INTRAVENOUS | Status: DC
  Administered 2023-12-28 – 2024-01-01 (×6): 3 mL via INTRAVENOUS

## 2023-12-28 MED ORDER — SODIUM CHLORIDE 0.9 % IV SOLN
INTRAVENOUS | Status: AC
  Administered 2023-12-29: 150 mL/h via INTRAVENOUS

## 2023-12-28 MED ORDER — ACETAMINOPHEN 325 MG PO TABS
650.0000 mg | ORAL_TABLET | Freq: Four times a day (QID) | ORAL | Status: DC | PRN
  Administered 2023-12-31: 650 mg via ORAL
  Filled 2023-12-28: qty 2

## 2023-12-28 MED ORDER — FAMOTIDINE IN NACL 20-0.9 MG/50ML-% IV SOLN
20.0000 mg | Freq: Once | INTRAVENOUS | Status: AC
  Administered 2023-12-28: 20 mg via INTRAVENOUS
  Filled 2023-12-28: qty 50

## 2023-12-28 MED ORDER — POTASSIUM CHLORIDE 10 MEQ/100ML IV SOLN
10.0000 meq | Freq: Once | INTRAVENOUS | Status: AC
  Administered 2023-12-28: 10 meq via INTRAVENOUS
  Filled 2023-12-28: qty 100

## 2023-12-28 MED ORDER — ONDANSETRON HCL 4 MG/2ML IJ SOLN
4.0000 mg | Freq: Once | INTRAMUSCULAR | Status: AC
  Administered 2023-12-28: 4 mg via INTRAVENOUS
  Filled 2023-12-28: qty 2

## 2023-12-28 MED ORDER — ENOXAPARIN SODIUM 30 MG/0.3ML IJ SOSY
30.0000 mg | PREFILLED_SYRINGE | INTRAMUSCULAR | Status: DC
  Administered 2023-12-28: 30 mg via SUBCUTANEOUS
  Filled 2023-12-28: qty 0.3

## 2023-12-28 MED ORDER — POTASSIUM CHLORIDE 10 MEQ/100ML IV SOLN
10.0000 meq | INTRAVENOUS | Status: AC
Start: 2023-12-28 — End: 2023-12-28
  Administered 2023-12-28 (×3): 10 meq via INTRAVENOUS
  Filled 2023-12-28 (×3): qty 100

## 2023-12-28 MED ORDER — SODIUM CHLORIDE 0.9 % IV BOLUS
1000.0000 mL | Freq: Once | INTRAVENOUS | Status: AC
  Administered 2023-12-28: 1000 mL via INTRAVENOUS

## 2023-12-28 MED ORDER — BUSPIRONE HCL 5 MG PO TABS
15.0000 mg | ORAL_TABLET | Freq: Two times a day (BID) | ORAL | Status: DC
  Administered 2023-12-29 – 2024-01-01 (×7): 15 mg via ORAL
  Filled 2023-12-28 (×7): qty 1

## 2023-12-28 MED ORDER — MIDAZOLAM HCL 2 MG/2ML IJ SOLN
0.5000 mg | INTRAMUSCULAR | Status: DC | PRN
  Administered 2023-12-28 – 2023-12-29 (×4): 0.5 mg via INTRAVENOUS
  Filled 2023-12-28 (×4): qty 2

## 2023-12-28 MED ORDER — POLYETHYLENE GLYCOL 3350 17 G PO PACK: 17.0000 g | PACK | Freq: Every day | ORAL | Status: DC | PRN

## 2023-12-28 MED ORDER — POTASSIUM CHLORIDE CRYS ER 20 MEQ PO TBCR
40.0000 meq | EXTENDED_RELEASE_TABLET | Freq: Once | ORAL | Status: AC
  Administered 2023-12-28: 40 meq via ORAL
  Filled 2023-12-28: qty 2

## 2023-12-28 MED ORDER — MAGNESIUM SULFATE 2 GM/50ML IV SOLN
2.0000 g | Freq: Once | INTRAVENOUS | Status: AC
  Administered 2023-12-28: 2 g via INTRAVENOUS
  Filled 2023-12-28: qty 50

## 2023-12-28 MED ORDER — MIDAZOLAM HCL 2 MG/2ML IJ SOLN
1.0000 mg | Freq: Once | INTRAMUSCULAR | Status: AC
  Administered 2023-12-28: 1 mg via INTRAVENOUS
  Filled 2023-12-28: qty 2

## 2023-12-28 MED ORDER — ACETAMINOPHEN 650 MG RE SUPP: 650.0000 mg | Freq: Four times a day (QID) | RECTAL | Status: DC | PRN

## 2023-12-28 MED ORDER — GABAPENTIN 300 MG PO CAPS
300.0000 mg | ORAL_CAPSULE | Freq: Two times a day (BID) | ORAL | Status: DC
  Administered 2023-12-29 – 2024-01-01 (×7): 300 mg via ORAL
  Filled 2023-12-28: qty 3
  Filled 2023-12-28 (×6): qty 1

## 2023-12-28 MED ORDER — ENOXAPARIN SODIUM 40 MG/0.4ML IJ SOSY
40.0000 mg | PREFILLED_SYRINGE | INTRAMUSCULAR | Status: DC
  Administered 2023-12-29 – 2023-12-31 (×3): 40 mg via SUBCUTANEOUS
  Filled 2023-12-28 (×3): qty 0.4

## 2023-12-28 MED ORDER — SODIUM CHLORIDE 0.9 % IV SOLN: Freq: Once | INTRAVENOUS | Status: AC

## 2023-12-28 MED ORDER — PANTOPRAZOLE SODIUM 40 MG PO TBEC
40.0000 mg | DELAYED_RELEASE_TABLET | Freq: Two times a day (BID) | ORAL | Status: DC
  Administered 2023-12-29 – 2024-01-01 (×5): 40 mg via ORAL
  Filled 2023-12-28 (×6): qty 1

## 2023-12-28 NOTE — Plan of Care (Signed)

## 2023-12-28 NOTE — H&P (Addendum)
 History and Physical   Angela Dixon FMW:[id number] DOB: [birth year] DOA: 12/28/2023  PCP: Angela Philippe SAUNDERS, NP   Patient coming from: Home  Chief Complaint: Nausea, vomiting  HPI: Angela Dixon is a 38 y.o. female with medical history significant of hyperlipidemia, GERD, alcohol use, ADHD, anxiety, depression, obesity, IBS presenting with nausea and vomiting.  Patient reports 3 days of intermittent nausea vomiting.  Reports she does have history of cannabis hyperemesis syndrome with her last use being a vape product in the days prior to symptom onset.  She also started [facility] 2 months ago.  Has had decreased p.o. intake.  Denies fevers, chills, chest pain, shortness of breath, abdominal pain, constipation, diarrhea.  ED Course: Vital signs in ED notable for blood pressure in the 130s-150 systolic.  Lab workup included CMP with potassium 2.8, chloride 90, bicarb 20, gap 30, BUN 34, creatinine 3.5 which is elevated from baseline of 0.8, calcium 11.5, protein 10.3, albumin 7.6, AST stable at 48.  CBC with leukocytosis to 18.9, erythrocytosis at 17.2, thrombocytosis at 414.  Urinalysis with hemoglobin, bili Ruben, ketones, protein.  Lipase normal.  Magnesium  1.3.  Osmolality mildly elevated at 389.  Urine osmolality, urine sodium, urine urea  nitrogen, urine creatinine pending.  CT on pelvis showed no acute normality.  Patient received 2 L IV fluids and started on rate IV fluids.  Received Zofran  and Pepcid  as well as Versed .  Also received 10 mEq IV potassium and 2 g IV magnesium .  Review of Systems: As per HPI otherwise all other systems reviewed and are negative.  Past Medical History:  Diagnosis Date   Abnormal Pap smear    Acid reflux    ADHD    Allergy    Anemia    Anxiety    Cannabinoid hyperemesis syndrome 02/28/2019   Chlamydia 2006   Depression    Fracture    Hypercholesterolemia    IBS (irritable bowel syndrome)    Vitamin D  deficiency     Past Surgical History:   Procedure Laterality Date   COLPOSCOPY     x 2, around 2004 and 2023   WISDOM TOOTH EXTRACTION      Social History  reports that she quit smoking about 17 years ago. Her smoking use included cigarettes. She has a 3 pack-year smoking history. She has never used smokeless tobacco. She reports current alcohol use of about 3.0 - 4.0 standard drinks of alcohol per week. She reports that she does not currently use drugs after having used the following drugs: Marijuana.  Allergies  Allergen Reactions   Wellbutrin  [Bupropion ] Hives   Zoloft  [Sertraline ] Other (See Comments)    Excessive sweating, insomnia   Neomycin Rash    Family History  Problem Relation Age of Onset   Hyperlipidemia Mother    Osteopenia Mother    Alcohol abuse Mother    Cancer Mother        Lung   COPD Mother    Diverticulitis Father    Alcohol abuse Father    Asthma Sister    Hypertension Maternal Grandmother    Diabetes Maternal Grandmother    Heart disease Maternal Grandfather    Cancer Maternal Grandfather    COPD Maternal Grandfather    Cancer Paternal Grandmother        throat, smoker   Cancer Paternal Grandfather        lung, smoker   COPD Paternal Grandfather    Colon cancer Neg Hx    Esophageal cancer Neg Hx  Stomach cancer Neg Hx    Rectal cancer Neg Hx   Reviewed on admission  Prior to Admission medications   Medication Sig Start Date End Date Taking? Authorizing Provider  ARIPiprazole (ABILIFY) 5 MG tablet Take 5 mg by mouth daily.   Yes [provider]  busPIRone  (BUSPAR ) 15 MG tablet Take 1 tablet (15 mg total) by mouth 2 (two) times daily. 03/10/23  Yes Cranford, Tonya, NP  cetirizine (ZYRTEC) 10 MG tablet Take 10 mg by mouth daily.   Yes [provider]  FLUoxetine  (PROZAC ) 40 MG capsule Take 40 mg by mouth daily. 12/09/23  Yes [provider]  folic acid (FOLVITE) 1 MG tablet Take 1 mg by mouth daily. 02/14/22  Yes [provider]  pantoprazole   (PROTONIX ) 40 MG tablet TAKE 1 TABLET (40 MG TOTAL) BY MOUTH TWICE A DAY BEFORE MEALS 11/27/23  Yes Angela Alan SAUNDERS, PA-C  albuterol  (VENTOLIN  HFA) 108 (90 Base) MCG/ACT inhaler Inhale 2 puffs into the lungs every 4 (four) hours as needed for wheezing or shortness of breath. 04/05/22   Wilkinson, Dana E, NP  amphetamine-dextroamphetamine (ADDERALL XR) 30 MG 24 hr capsule TAKE 1 CAPSULE BY MOUTH DAILY HAVING TO CHANGE ONLY BECAUSE OF [facility]    [provider]  amphetamine-dextroamphetamine (ADDERALL) 10 MG tablet Take 10 mg by mouth daily.    [provider]  levonorgestrel (MIRENA, 52 MG,) 20 MCG/DAY IUD 1 each by Intrauterine route once. 12/23/22   [provider]    Physical Exam: Vitals:   12/28/23 0839 12/28/23 1325 12/28/23 1429 12/28/23 1442  BP: (!) 134/98  (!) 150/96 (!) 150/96  Pulse: 88  82 82  Resp: 18  20 20   Temp: 98.3 F (36.8 C) 98.1 F (36.7 C) 98.8 F (37.1 C) 98.8 F (37.1 C)  TempSrc: Oral Oral Oral Oral  SpO2: 100%  100%   Weight:    85.7 kg  Height:    5' 4 (1.626 m)    Physical Exam Constitutional:      General: She is not in acute distress.    Appearance: Normal appearance. She is obese.  HENT:     Head: Normocephalic and atraumatic.     Mouth/Throat:     Mouth: Mucous membranes are moist.     Pharynx: Oropharynx is clear.  Eyes:     Extraocular Movements: Extraocular movements intact.     Pupils: Pupils are equal, round, and reactive to light.  Cardiovascular:     Rate and Rhythm: Normal rate and regular rhythm.     Pulses: Normal pulses.     Heart sounds: Normal heart sounds.  Pulmonary:     Effort: Pulmonary effort is normal. No respiratory distress.     Breath sounds: Normal breath sounds.  Abdominal:     General: Bowel sounds are normal. There is no distension.     Palpations: Abdomen is soft.     Tenderness: There is no abdominal tenderness.  Musculoskeletal:        General: No swelling or deformity.   Skin:    General: Skin is warm and dry.     Comments: Redness bilateral cheeks  Neurological:     General: No focal deficit present.     Mental Status: Mental status is at baseline.    Labs on Admission: I have personally reviewed following labs and imaging studies  CBC: Recent Labs  Lab 12/28/23 0919  WBC 18.9*  NEUTROABS 15.8*  HGB 17.2*  HCT 46.9*  MCV 82.9  PLT 414*    Basic Metabolic Panel: Recent Labs  Lab 12/28/23 0919  NA 139  K 2.8*  CL 90*  CO2 20*  GLUCOSE 131*  BUN 34*  CREATININE 3.53*  CALCIUM 11.5*  MG 1.3*    GFR: Estimated Creatinine Clearance: 22.7 mL/min (A) (by C-G formula based on SCr of 3.53 mg/dL (H)).  Liver Function Tests: Recent Labs  Lab 12/28/23 0919  AST 48*  ALT 29  ALKPHOS 67  BILITOT 0.7  PROT 10.3*  ALBUMIN 7.6*    Urine analysis:    Component Value Date/Time   COLORURINE YELLOW 12/28/2023 1058   APPEARANCEUR HAZY (A) 12/28/2023 1058   LABSPEC 1.021 12/28/2023 1058   PHURINE 6.0 12/28/2023 1058   GLUCOSEU NEGATIVE 12/28/2023 1058   HGBUR LARGE (A) 12/28/2023 1058   BILIRUBINUR SMALL (A) 12/28/2023 1058   KETONESUR 15 (A) 12/28/2023 1058   PROTEINUR >300 (A) 12/28/2023 1058   UROBILINOGEN 0.2 12/09/2013 1226   NITRITE NEGATIVE 12/28/2023 1058   LEUKOCYTESUR NEGATIVE 12/28/2023 1058    Radiological Exams on Admission: CT ABDOMEN PELVIS WO CONTRAST Result Date: 12/28/2023 CLINICAL DATA:  Epigastric pain with nausea and vomiting. Acute renal insufficiency. Evaluate for postobstructive source. EXAM: CT ABDOMEN AND PELVIS WITHOUT CONTRAST TECHNIQUE: Multidetector CT imaging of the abdomen and pelvis was performed following the standard protocol without IV contrast. RADIATION DOSE REDUCTION: This exam was performed according to the departmental dose-optimization program which includes automated exposure control, adjustment of the mA and/or kV according to patient size and/or use of iterative reconstruction technique.  COMPARISON:  07/09/2020, 02/24/2019 FINDINGS: Lower chest: Heart is normal size.  Lung bases are clear. Hepatobiliary: Liver, gallbladder and biliary tree are normal. Pancreas: Normal. Spleen: Normal. Adrenals/Urinary Tract: Adrenal glands are normal. Kidneys are normal in size without hydronephrosis or nephrolithiasis. Ureters and bladder are normal. Stomach/Bowel: Stomach and small bowel are normal. Appendix is normal colon is normal. Vascular/Lymphatic: Abdominal aorta is normal in caliber. Remaining vascular structures are unremarkable. No adenopathy. Reproductive: IUD in adequate position.  Ovaries are normal. Other: No free fluid or focal inflammatory change. Musculoskeletal: No focal abnormality. Minimal sclerosis of the sacroiliac joints. Mild disc disease with endplate sclerosis at the T11-12 level. IMPRESSION: No acute findings in the abdomen/pelvis. Electronically Signed   By: Toribio Agreste M.D.   On: 12/28/2023 10:59   EKG: Independently reviewed.  Sinus rhythm at 91 beats minute.  QTc prolonged at 580.  Assessment/Plan Principal Problem:   AKI (acute kidney injury) (HCC) Active Problems:   Depression   Generalized anxiety disorder   GERD (gastroesophageal reflux disease)   Irritable bowel syndrome (IBS)   Excessive drinking of alcohol   Morbid obesity with BMI of 40.0-44.9, adult (HCC)   Mixed hyperlipidemia   Prolonged QT interval   Hypokalemia   Hypomagnesemia   Leukocytosis   Erythrocytosis   Thrombocytosis   High anion gap metabolic acidosis   Nausea vomiting AKI Hypokalemia Hypomagnesemia High anion gap metabolic acidosis > Patient presenting with 3 days of nausea vomiting and decreased p.o. intake.  Found to have creatinine elevated to 3.5 from baseline of 0.8.  Also noted to have potassium of 2.8 magnesium  of 1.3.  Bicarb mildly low at 20 with significant elevated gap at 30.  Possibly some component of elevated BUN however also check lactic acid for completeness.  Some  ketones noted on urinalysis so there could be a ketosis component, will check beta hydroxybutyric acid as well.. > Patient has history  of hyperemesis secondary to marijuana, used a vape product in the days prior to symptom onset. > Did start [facility] 2 months ago. > ?Malar rash as below? > CT abdomen pelvis showed no acute abnormality including no obstructive explanation for renal failure > Received 2 L of IV fluids in the ED and started on rate of fluids.  Received 2 g IV magnesium  and 10 mEq IV potassium. > Initially received some Zofran  prior to EKG results showing QT prolongation as below.  Subsequently received Versed  as an alternative. - Previously accepted to progressive unit, will monitor here overnight - Continue with IV fluids overnight - Add 30 mEq IV potassium, 40 mEq p.o. potassium if tolerated - Trial of scheduled gabapentin  and PRN versed  for Nausea while QTc is Prolonged - Hold Wegovy - Repeat BMP and magnesium  now - Continue to trend renal function and electrolytes  Leukocytosis Erythrocytosis Thrombocytosis > Leukocytosis may have a reactive component but with all 3 cell lines elevated likely hemoconcentration. > No infectious etiology is identified on CT - Continue to trend CBC  Prolonged QTc > EKG in the ED shows QTc prolongation of 580.  Did receive Zofran  in the ED prior to this and has subsequently received Versed  for nausea. - Avoid QTc prolonging medications - Repeat EKG now and tomorrow morning  Hyperlipidemia - Not been on medication for this per chart   GERD - Continue home PPI as tolerated  ADHD Anxiety Depression - Holding Adderall and Abilify in the setting of prolonged QTc - Continue BuSpar  and fluoxetine  when tolerated  Erythema, ?Malar rash > Redness on her cheeks which she says is somewhat chronic and she gets red when she is warm or having other symptoms.  - Monitor, consider workup for Autoimmune Disease if this is persistent, especially if  renal function fails to improve.  DVT prophylaxis: Lovenox  Code Status:   Full Family Communication:  None on admission  Disposition Plan:   Patient is from:  Home  Anticipated DC to:  Home  Anticipated DC date:  2 to 4 days  Anticipated DC barriers: None  Consults called:  None Admission status:  Inpatient, progressive  Severity of Illness: The appropriate patient status for this patient is INPATIENT. Inpatient status is judged to be reasonable and necessary in order to provide the required intensity of service to ensure the patient's safety. The patient's presenting symptoms, physical exam findings, and initial radiographic and laboratory data in the context of their chronic comorbidities is felt to place them at high risk for further clinical deterioration. Furthermore, it is not anticipated that the patient will be medically stable for discharge from the hospital within 2 midnights of admission.   * I certify that at the point of admission it is my clinical judgment that the patient will require inpatient hospital care spanning beyond 2 midnights from the point of admission due to high intensity of service, high risk for further deterioration and high frequency of surveillance required.DEWAINE Marsa KATHEE Seena MD [facility]  How to contact the [facility] Attending or Consulting provider 7A - 7P or covering provider during after hours 7P -7A, for this patient?   Check the care team in [facility] and look for a) attending/consulting [facility] provider listed and b) the [facility] team listed Log into [email or web address] and use [place] universal password to access. If you do not have the password, please contact the hospital operator. Locate the [facility] provider you are looking for under [facility] and page to  a number that you can be directly reached. If you still have difficulty reaching the provider, please page the [facility] (Director on Call) for the Hospitalists listed on amion for  assistance.  12/28/2023, 3:34 PM

## 2023-12-28 NOTE — ED Notes (Signed)
 Attempted to report to IP RN. Report given to Transport.

## 2023-12-28 NOTE — ED Provider Notes (Signed)
 Angela Dixon AT [facility] Provider Note   CSN: [id number] Arrival date & time: 12/28/23  9165     Patient presents with: Emesis   Angela Dixon is a 38 y.o. female.    Emesis    Patient presents with nausea and vomiting.  Patient states that she has been having episodes of nausea and vomiting since Friday.  She does state that she has a history of cannabinoid hyperemesis syndrome.  Patient last vaped on Thursday.  Patient states that she also on Wegovy.  On this for the past 2 months.  Supposed to receive a dose on Friday but did not take because of nausea and vomiting.  Patient states that she is not having any real abdominal pain.  No hematochezia or hematemesis.  Bowel movements are been regular.  Still passing flatulence.  Endorses some episodes of palpitations but no chest pain or shortness of breath.  No pleuritic chest pain.  No hemoptysis.  No history of DVT or PE.  Denies all chest pain.  Previous medical history reviewed : Last followed up with gastroenterology in September 2024.  History of GERD.  Irritable bowel syndrome with diarrhea.     Prior to Admission medications   Medication Sig Start Date End Date Taking? Authorizing Provider  albuterol  (VENTOLIN  HFA) 108 (90 Base) MCG/ACT inhaler Inhale 2 puffs into the lungs every 4 (four) hours as needed for wheezing or shortness of breath. 04/05/22   Wilkinson, Dana E, NP  amphetamine-dextroamphetamine (ADDERALL XR) 30 MG 24 hr capsule TAKE 1 CAPSULE BY MOUTH DAILY HAVING TO CHANGE ONLY BECAUSE OF [facility]    [provider]  amphetamine-dextroamphetamine (ADDERALL) 10 MG tablet Take 10 mg by mouth daily.    [provider]  ARIPiprazole (ABILIFY) 5 MG tablet Take 5 mg by mouth daily.    [provider]  busPIRone  (BUSPAR ) 15 MG tablet Take 1 tablet (15 mg total) by mouth 2 (two) times daily. 03/10/23   Cranford, Tonya, NP  cetirizine (ZYRTEC) 10 MG tablet Take 10  mg by mouth daily.    [provider]  FLUoxetine  (PROZAC ) 40 MG capsule Take 40 mg by mouth daily. 12/09/23   [provider]  folic acid (FOLVITE) 1 MG tablet Take 1 mg by mouth daily. 02/14/22   [provider]  levonorgestrel (MIRENA, 52 MG,) 20 MCG/DAY IUD 1 each by Intrauterine route once. 12/23/22   [provider]  pantoprazole  (PROTONIX ) 40 MG tablet TAKE 1 TABLET (40 MG TOTAL) BY MOUTH TWICE A DAY BEFORE MEALS 11/27/23   Craig Alan SAUNDERS, PA-C    Allergies: Wellbutrin  [bupropion ], Zoloft  [sertraline ], and Neomycin    Review of Systems  Gastrointestinal:  Positive for vomiting.    Updated Vital Signs BP (!) 134/98 (BP Location: Right Arm)   Pulse 88   Temp 98.3 F (36.8 C) (Oral)   Resp 18   SpO2 100%   Physical Exam Vitals and nursing note reviewed.  Constitutional:      General: She is not in acute distress.    Appearance: She is well-developed.  HENT:     Head: Normocephalic and atraumatic.  Eyes:     Conjunctiva/sclera: Conjunctivae normal.  Cardiovascular:     Rate and Rhythm: Normal rate and regular rhythm.     Heart sounds: No murmur heard. Pulmonary:     Effort: Pulmonary effort is normal. No respiratory distress.     Breath sounds: Normal breath sounds.  Abdominal:  Palpations: Abdomen is soft.     Tenderness: There is no abdominal tenderness.  Musculoskeletal:        General: No swelling.     Cervical back: Neck supple.  Skin:    General: Skin is warm and dry.     Capillary Refill: Capillary refill takes less than 2 seconds.  Neurological:     Mental Status: She is alert.  Psychiatric:        Mood and Affect: Mood normal.     (all labs ordered are listed, but only abnormal results are displayed) Labs Reviewed  CBC WITH DIFFERENTIAL/PLATELET - Abnormal; Notable for the following components:      Result Value   WBC 18.9 (*)    RBC 5.66 (*)    Hemoglobin 17.2 (*)    HCT 46.9 (*)    MCHC 36.7 (*)     Platelets 414 (*)    Neutro Abs 15.8 (*)    Monocytes Absolute 1.4 (*)    Abs Immature Granulocytes 0.09 (*)    All other components within normal limits  COMPREHENSIVE METABOLIC PANEL WITH GFR - Abnormal; Notable for the following components:   Potassium 2.8 (*)    Chloride 90 (*)    CO2 20 (*)    Glucose, Bld 131 (*)    BUN 34 (*)    Creatinine, Ser 3.53 (*)    Calcium 11.5 (*)    Total Protein 10.3 (*)    Albumin 7.6 (*)    AST 48 (*)    GFR, Estimated 16 (*)    Anion gap 30 (*)    All other components within normal limits  MAGNESIUM  - Abnormal; Notable for the following components:   Magnesium  1.3 (*)    All other components within normal limits  LIPASE, BLOOD  HCG, SERUM, QUALITATIVE  URINALYSIS, ROUTINE W REFLEX MICROSCOPIC  OSMOLALITY, URINE  SODIUM, URINE, RANDOM  UREA  NITROGEN, URINE  CREATININE, URINE, RANDOM  OSMOLALITY    EKG: EKG Interpretation Date/Time:  Sunday December 28 2023 09:33:00 EDT Ventricular Rate:  91 PR Interval:  146 QRS Duration:  103 QT Interval:  471 QTC Calculation: 580 R Axis:   77  Text Interpretation: Sinus rhythm Biatrial enlargement Anteroseptal infarct, age indeterminate Prolonged QT interval Confirmed by Simon Rea [phone]) on 12/28/2023 9:51:02 AM  Radiology: CT ABDOMEN PELVIS WO CONTRAST Result Date: 12/28/2023 CLINICAL DATA:  Epigastric pain with nausea and vomiting. Acute renal insufficiency. Evaluate for postobstructive source. EXAM: CT ABDOMEN AND PELVIS WITHOUT CONTRAST TECHNIQUE: Multidetector CT imaging of the abdomen and pelvis was performed following the standard protocol without IV contrast. RADIATION DOSE REDUCTION: This exam was performed according to the departmental dose-optimization program which includes automated exposure control, adjustment of the mA and/or kV according to patient size and/or use of iterative reconstruction technique. COMPARISON:  07/09/2020, 02/24/2019 FINDINGS: Lower chest: Heart is normal  size.  Lung bases are clear. Hepatobiliary: Liver, gallbladder and biliary tree are normal. Pancreas: Normal. Spleen: Normal. Adrenals/Urinary Tract: Adrenal glands are normal. Kidneys are normal in size without hydronephrosis or nephrolithiasis. Ureters and bladder are normal. Stomach/Bowel: Stomach and small bowel are normal. Appendix is normal colon is normal. Vascular/Lymphatic: Abdominal aorta is normal in caliber. Remaining vascular structures are unremarkable. No adenopathy. Reproductive: IUD in adequate position.  Ovaries are normal. Other: No free fluid or focal inflammatory change. Musculoskeletal: No focal abnormality. Minimal sclerosis of the sacroiliac joints. Mild disc disease with endplate sclerosis at the T11-12 level. IMPRESSION: No acute findings in  the abdomen/pelvis. Electronically Signed   By: Toribio Agreste M.D.   On: 12/28/2023 10:59     Procedures   Medications Ordered in the ED  ondansetron  (ZOFRAN ) injection 4 mg (4 mg Intravenous Given 12/28/23 0919)  sodium chloride  0.9 % bolus 1,000 mL (0 mLs Intravenous Stopped 12/28/23 1054)  midazolam  (VERSED ) injection 1 mg (1 mg Intravenous Given 12/28/23 1007)  sodium chloride  0.9 % bolus 1,000 mL (1,000 mLs Intravenous New Bag/Given 12/28/23 1047)  potassium chloride  10 mEq in 100 mL IVPB (0 mEq Intravenous Stopped 12/28/23 1146)  magnesium  sulfate IVPB 2 g 50 mL (0 g Intravenous Stopped 12/28/23 1146)  0.9 %  sodium chloride  infusion ( Intravenous New Bag/Given 12/28/23 1148)                                    Medical Decision Making Amount and/or Complexity of Data Reviewed Labs: ordered. Radiology: ordered.  Risk Prescription drug management. Decision regarding hospitalization.    Previous medical history reviewed : Last followed up with gastroenterology in September 2024.  History of GERD.  Irritable bowel syndrome with diarrhea.   Patient presents with nausea and vomiting.  Patient states that she has been having  episodes of nausea and vomiting since Friday.  She does state that she has a history of cannabinoid hyperemesis syndrome.  Patient last vaped on Thursday.  Patient states that she also on Wegovy.  On this for the past 2 months.  Supposed to receive a dose on Friday but did not take because of nausea and vomiting.  Patient states that she is not having any real abdominal pain.  No hematochezia or hematemesis.  Bowel movements are been regular.  Still passing flatulence.  Endorses some episodes of palpitations but no chest pain or shortness of breath.  No pleuritic chest pain.  No hemoptysis.  No history of DVT or PE.  Denies all chest pain.  Previous medical history reviewed : Last followed up with gastroenterology in September 2024.  History of GERD.  Irritable bowel syndrome with diarrhea.   Upon exam, patient hemodynamically stable.  ANO x 3 GCS 15.  Soft and benign abdomen.  No rebound or guarding back appreciated.  Afebrile.   Initially ordered Zofran  for the patient.  Gave first dose of Zofran .  Did not obtain EKG.  She had no history of QTc prolongation.  EKG showed prolonged QTc so we will hold off any kind of other prolongation agents.  For all other nausea complaints, elected to give benzos at this time.  In terms of etiology of QTc prolongation, and likely electrolyte derangements.  Potassium as well as magnesium  low.  Replaced via IV.  Hesitant to give large dose of IV potassium given AKI.  Did give 2 g of magnesium .   In terms of the patient's lab work.  Significant AKI.  Electrolyte derangements as above.  Baseline creatinine around 1.  Today's creatinine is 3.53 with elevated BUN.  Did obtain CT abdomen without contrast to assess for any kind of postobstructive pathology.  Unremarkable.  Rather, I think this is more likely to be AKI with poor PO intake.  Did order serum osm and urine studies for further characterization this is pending at time of admission.  Leukocytosis.  No clear  infectious etiology.  Also cell lines increased and could be related to dehydration.  No sick contacts that she is aware of.  Soft  and benign abdomen.  No diarrhea that point towards gastroenteritis at this time.  Bowel movements been normal.  No recent start antibiotics at this time.  Question whether or not this could be related to cannabinoid hyperemesis syndrome.  Patient does relate a history of this.  Last smoked Thursday subsequently symptoms started on Friday.  Could be contributing.  Patient denies any binges of alcohol here recently.   Patient received 2 L of fluid followed by normal saline infusion at 125 cc/h.  Received 2 g of magnesium  as well as 10 mill equivalents of IV potassium.  Cardiac telemetry reviewed by me: Normal sinus rhythm.  No arrhythmia  Patient being admitted for further care.        Final diagnoses:  AKI (acute kidney injury) (HCC)  Hypokalemia  Hypomagnesemia  Nausea and vomiting, unspecified vomiting type    ED Discharge Orders     None          Simon Lavonia SAILOR, MD 12/28/23 1150

## 2023-12-28 NOTE — ED Triage Notes (Signed)
 C/o n/v since Friday. States has hx of CHS and feels similar. Denies ABD pain.

## 2023-12-28 NOTE — ED Notes (Signed)
 Florence Bare at [facility] for transport

## 2023-12-28 NOTE — Plan of Care (Signed)
 Plan of Care Note for accepted transfer   Patient: Angela Dixon MRN: [id number]   DOA: 12/28/2023  Facility requesting transfer: [facility] Requesting Provider: Lavonia Pat, MD Reason for transfer: Nausea and Vomiting Facility course:   Patient is a 38 year old Caucasian female with a past medical history for ADHD, marijuana abuse, depression and anxiety as well as GERD as well as history of irritable bowel syndrome with diarrhea who presents with nausea vomiting.  Has been having episodes of nausea vomiting since Friday and has a history of cannabinoid hyperemesis syndrome and states that she last vaped marijuana Thursday.  Subsequently started having trouble nausea vomiting on Friday and has not had any diarrhea.  Denies any abdominal discomfort.  Feels weak and endorses some episodes of palpitations but no chest pain, shortness of breath or dizziness.  Found to have significant electrolyte abnormalities and AKI so was accepted to the progressive care unit at [facility].  Has been given multiple fluid boluses and placed on maintenance IV fluids and given antiemetics.  Given her EKG abnormality and prolonged QT in the setting of electrolyte abnormalities the patient will need Telemetry Monitoring.  Plan of care: The patient is accepted for admission to Progressive unit, at [facility]..    Author: Alejandro Marker, DO [facility] 12/28/2023  Check [email or web address] for on-call coverage.  Nursing staff, Please call [facility] Admits & Consults System-Wide number on Amion as soon as patient's arrival, so appropriate admitting provider can evaluate the pt.

## 2023-12-29 DIAGNOSIS — N179 Acute kidney failure, unspecified: Secondary | ICD-10-CM

## 2023-12-29 LAB — CBC
HCT: 41.9 % (ref 36.0–46.0)
Hemoglobin: 13.6 g/dL (ref 12.0–15.0)
MCH: 29.2 pg (ref 26.0–34.0)
MCHC: 32.5 g/dL (ref 30.0–36.0)
MCV: 89.9 fL (ref 80.0–100.0)
Platelets: 258 K/uL (ref 150–400)
RBC: 4.66 MIL/uL (ref 3.87–5.11)
RDW: 13.1 % (ref 11.5–15.5)
WBC: 12 K/uL — ABNORMAL HIGH (ref 4.0–10.5)
nRBC: 0 % (ref 0.0–0.2)

## 2023-12-29 LAB — COMPREHENSIVE METABOLIC PANEL WITH GFR
ALT: 21 U/L (ref 0–44)
AST: 42 U/L — ABNORMAL HIGH (ref 15–41)
Albumin: 4.4 g/dL (ref 3.5–5.0)
Alkaline Phosphatase: 44 U/L (ref 38–126)
Anion gap: 16 — ABNORMAL HIGH (ref 5–15)
BUN: 16 mg/dL (ref 6–20)
CO2: 23 mmol/L (ref 22–32)
Calcium: 8.3 mg/dL — ABNORMAL LOW (ref 8.9–10.3)
Chloride: 105 mmol/L (ref 98–111)
Creatinine, Ser: 1.08 mg/dL — ABNORMAL HIGH (ref 0.44–1.00)
GFR, Estimated: >60 mL/min (ref >60)
Glucose, Bld: 80 mg/dL (ref 70–99)
Potassium: 3 mmol/L — ABNORMAL LOW (ref 3.5–5.1)
Sodium: 145 mmol/L (ref 135–145)
Total Bilirubin: 0.8 mg/dL (ref 0.0–1.2)
Total Protein: 6.9 g/dL (ref 6.5–8.1)

## 2023-12-29 LAB — UREA NITROGEN, URINE: Urea Nitrogen, Ur: 650 mg/dL

## 2023-12-29 MED ORDER — POTASSIUM CHLORIDE 20 MEQ PO PACK
40.0000 meq | PACK | Freq: Once | ORAL | Status: AC
  Administered 2023-12-29: 40 meq via ORAL
  Filled 2023-12-29: qty 2

## 2023-12-29 MED ORDER — PROCHLORPERAZINE EDISYLATE 10 MG/2ML IJ SOLN
5.0000 mg | Freq: Once | INTRAMUSCULAR | Status: AC
  Administered 2023-12-29: 5 mg via INTRAVENOUS
  Filled 2023-12-29: qty 2

## 2023-12-29 MED ORDER — PROCHLORPERAZINE EDISYLATE 10 MG/2ML IJ SOLN: 5.0000 mg | Freq: Once | INTRAMUSCULAR | Status: DC | PRN

## 2023-12-29 MED ORDER — MIDAZOLAM HCL 2 MG/2ML IJ SOLN
0.5000 mg | INTRAMUSCULAR | Status: DC | PRN
  Administered 2023-12-29 – 2024-01-01 (×9): 0.5 mg via INTRAVENOUS
  Filled 2023-12-29 (×9): qty 2

## 2023-12-29 NOTE — Plan of Care (Signed)

## 2023-12-29 NOTE — Plan of Care (Signed)

## 2023-12-29 NOTE — Progress Notes (Signed)
 PROGRESS NOTE    Angela Dixon  FMW:[id number] DOB: 09/23/1985 DOA: 12/28/2023 PCP: Billy Philippe SAUNDERS, NP   Brief Narrative:  This 38 years old female with PMH significant for hyperlipidemia, GERD, alcohol use, ADHD, anxiety, depression, obesity, IBS presented in the ED with complaints of nausea and vomiting for last 3 days.  Patient does have a history of cannabis hyperemesis syndrome with her last use being via Vape product in days prior to symptom onset.  She was also started on Wegovy 2 months ago.  Patient has been reporting decreased oral intake.  She is found to have significant electrolyte abnormalities on labs in the ED :  potassium 2.8, chloride 90, bicarb 20, gap 30, creatinine 3.5, calcium 11.5 CBC shows leukocytosis 18.9, thrombocytosis 414. CT A&P shows no acute abnormality.  Patient is admitted for further evaluation and started on IV hydration and IV Zofran  for nausea and vomiting.  Assessment & Plan:   Principal Problem:   AKI (acute kidney injury) (HCC) Active Problems:   Depression   Generalized anxiety disorder   GERD (gastroesophageal reflux disease)   Irritable bowel syndrome (IBS)   Excessive drinking of alcohol   Morbid obesity with BMI of 40.0-44.9, adult (HCC)   Mixed hyperlipidemia   Prolonged QT interval   Hypokalemia   Hypomagnesemia   Leukocytosis   Erythrocytosis   Thrombocytosis   High anion gap metabolic acidosis   Nausea and vomiting: Electrolyte abnormalities (hypokalemia, hypomagnesemia) Acute kidney injury: High anion gap metabolic acidosis Patient presenting with 3 days of nausea, vomiting and decreased intake.   Found to have creatinine elevated to 3.5 from baseline of 0.8.   She is also noted to have potassium of 2.8 magnesium  of 1.3. elevated gap at 30.   Patient has history of hyperemesis secondary to marijuana, used a vape product in the days prior to symptom onset. She was also started on Wegovy 2 months ago. CT A/P showed no acute  abnormality including no obstructive explanation for renal failure. Continue IV fluid resuscitation.  Patient was given replacement for low potassium low magnesium . Continue IV Zofran  as needed for nausea and vomiting. Trial of scheduled gabapentin  and PRN versed  for Nausea while QTc is Prolonged. [facility] Labs are Improving.  Continue to monitor.   Leukocytosis / Erythrocytosis/ Thrombocytosis No infectious etiology is identified on CT CBC shows improved cell lines.   Prolonged QTc EKG in the ED shows QTc prolongation of 580.   Avoid QTc prolonging medications Repeat EKG now and tomorrow morning   Hyperlipidemia Not on any medication.   GERD: Continue home PPI as tolerated   ADHD / Anxiety / Depression: Holding Adderall and Abilify in the setting of prolonged QTc Continue BuSpar  and fluoxetine  when tolerated   DVT prophylaxis: Lovenox  Code Status: Full code Family Communication: No family at bedside Disposition Plan:    Status is: Inpatient Remains inpatient appropriate because: Severity of illness.  Consultants:  None  Procedures: CT A/P  Antimicrobials:  Anti-infectives (From admission, onward)    None       Subjective: Patient was seen and examined at bedside.  Overnight events noted. Patient was lying comfortably on the bed and states she is feeling slightly better, Nausea and vomiting is better,  still has some abdominal discomfort but improving  Objective: Vitals:   12/28/23 1442 12/28/23 1815 12/28/23 2232 12/29/23 0517  BP: (!) 150/96 113/69 120/72 124/79  Pulse: 82 83 64 (!) 55  Resp: 20 20 15 15   Temp: 98.8 F (  37.1 C) 99.1 F (37.3 C) 98.5 F (36.9 C) 98.6 F (37 C)  TempSrc: Oral Oral Oral Oral  SpO2:  100% 100% 98%  Weight: 85.7 kg     Height: 5' 4 (1.626 m)       Intake/Output Summary (Last 24 hours) at 12/29/2023 1340 Last data filed at 12/29/2023 0600 Gross per 24 hour  Intake 3637.65 ml  Output --  Net 3637.65 ml   Filed  Weights   12/28/23 1442  Weight: 85.7 kg    Examination:  General exam: Appears calm and comfortable, not in any acute distress. Respiratory system: Clear to auscultation. Respiratory effort normal.  RR 14 Cardiovascular system: S1 & S2 heard, RRR. No JVD, murmurs, rubs, gallops or clicks.  Gastrointestinal system: Abdomen is non distended, soft and non tender.  Normal bowel sounds heard. Central nervous system: Alert and oriented X 3. No focal neurological deficits. Extremities: No edema, no cyanosis, no clubbing. Skin: No rashes, lesions or ulcers Psychiatry: Judgement and insight appear normal. Mood & affect appropriate.     Data Reviewed: I have personally reviewed following labs and imaging studies  CBC: Recent Labs  Lab 12/28/23 0919 12/29/23 0335  WBC 18.9* 12.0*  NEUTROABS 15.8*  --   HGB 17.2* 13.6  HCT 46.9* 41.9  MCV 82.9 89.9  PLT 414* 258   Basic Metabolic Panel: Recent Labs  Lab 12/28/23 0919 12/28/23 1547 12/29/23 0335  NA 139 141 145  K 2.8* 3.2* 3.0*  CL 90* 99 105  CO2 20* 19* 23  GLUCOSE 131* 108* 80  BUN 34* 26* 16  CREATININE 3.53* 1.94* 1.08*  CALCIUM 11.5* 9.3 8.3*  MG 1.3* 2.5*  --    GFR: Estimated Creatinine Clearance: 74.1 mL/min (A) (by C-G formula based on SCr of 1.08 mg/dL (H)). Liver Function Tests: Recent Labs  Lab 12/28/23 0919 12/29/23 0335  AST 48* 42*  ALT 29 21  ALKPHOS 67 44  BILITOT 0.7 0.8  PROT 10.3* 6.9  ALBUMIN 7.6* 4.4   Recent Labs  Lab 12/28/23 0919  LIPASE 28   No results for input(s): AMMONIA in the last 168 hours. Coagulation Profile: No results for input(s): INR, PROTIME in the last 168 hours. Cardiac Enzymes: No results for input(s): CKTOTAL, CKMB, CKMBINDEX, TROPONINI in the last 168 hours. BNP (last 3 results) No results for input(s): PROBNP in the last 8760 hours. HbA1C: No results for input(s): HGBA1C in the last 72 hours. CBG: No results for input(s): GLUCAP in the  last 168 hours. Lipid Profile: No results for input(s): CHOL, HDL, LDLCALC, TRIG, CHOLHDL, LDLDIRECT in the last 72 hours. Thyroid  Function Tests: No results for input(s): TSH, T4TOTAL, FREET4, T3FREE, THYROIDAB in the last 72 hours. Anemia Panel: No results for input(s): VITAMINB12, FOLATE, FERRITIN, TIBC, IRON, RETICCTPCT in the last 72 hours. Sepsis Labs: Recent Labs  Lab 12/28/23 1547  LATICACIDVEN 1.2    No results found for this or any previous visit (from the past 240 hours).    Radiology Studies: CT ABDOMEN PELVIS WO CONTRAST Result Date: 12/28/2023 CLINICAL DATA:  Epigastric pain with nausea and vomiting. Acute renal insufficiency. Evaluate for postobstructive source. EXAM: CT ABDOMEN AND PELVIS WITHOUT CONTRAST TECHNIQUE: Multidetector CT imaging of the abdomen and pelvis was performed following the standard protocol without IV contrast. RADIATION DOSE REDUCTION: This exam was performed according to the departmental dose-optimization program which includes automated exposure control, adjustment of the mA and/or kV according to patient size and/or use of iterative reconstruction  technique. COMPARISON:  07/09/2020, 02/24/2019 FINDINGS: Lower chest: Heart is normal size.  Lung bases are clear. Hepatobiliary: Liver, gallbladder and biliary tree are normal. Pancreas: Normal. Spleen: Normal. Adrenals/Urinary Tract: Adrenal glands are normal. Kidneys are normal in size without hydronephrosis or nephrolithiasis. Ureters and bladder are normal. Stomach/Bowel: Stomach and small bowel are normal. Appendix is normal colon is normal. Vascular/Lymphatic: Abdominal aorta is normal in caliber. Remaining vascular structures are unremarkable. No adenopathy. Reproductive: IUD in adequate position.  Ovaries are normal. Other: No free fluid or focal inflammatory change. Musculoskeletal: No focal abnormality. Minimal sclerosis of the sacroiliac joints. Mild disc disease with  endplate sclerosis at the T11-12 level. IMPRESSION: No acute findings in the abdomen/pelvis. Electronically Signed   By: Toribio Agreste M.D.   On: 12/28/2023 10:59   Scheduled Meds:  busPIRone   15 mg Oral BID   enoxaparin  (LOVENOX ) injection  40 mg Subcutaneous Q24H   FLUoxetine   40 mg Oral Daily   gabapentin   300 mg Oral BID   pantoprazole   40 mg Oral BID AC   sodium chloride  flush  3 mL Intravenous Q12H   Continuous Infusions:   LOS: 1 day    Time spent: 50 mins    Darcel Dawley, MD [facility]   If 7PM-7AM, please contact night-coverage

## 2023-12-30 DIAGNOSIS — N179 Acute kidney failure, unspecified: Secondary | ICD-10-CM | POA: Diagnosis not present

## 2023-12-30 LAB — CBC
HCT: 38.9 % (ref 36.0–46.0)
Hemoglobin: 13.3 g/dL (ref 12.0–15.0)
MCH: 30.4 pg (ref 26.0–34.0)
MCHC: 34.2 g/dL (ref 30.0–36.0)
MCV: 88.8 fL (ref 80.0–100.0)
Platelets: 228 K/uL (ref 150–400)
RBC: 4.38 MIL/uL (ref 3.87–5.11)
RDW: 12.8 % (ref 11.5–15.5)
WBC: 9.4 K/uL (ref 4.0–10.5)
nRBC: 0 % (ref 0.0–0.2)

## 2023-12-30 LAB — BASIC METABOLIC PANEL WITH GFR
Anion gap: 16 — ABNORMAL HIGH (ref 5–15)
BUN: 8 mg/dL (ref 6–20)
CO2: 24 mmol/L (ref 22–32)
Calcium: 8.6 mg/dL — ABNORMAL LOW (ref 8.9–10.3)
Chloride: 103 mmol/L (ref 98–111)
Creatinine, Ser: 0.7 mg/dL (ref 0.44–1.00)
GFR, Estimated: >60 mL/min (ref >60)
Glucose, Bld: 99 mg/dL (ref 70–99)
Potassium: 2.9 mmol/L — ABNORMAL LOW (ref 3.5–5.1)
Sodium: 143 mmol/L (ref 135–145)

## 2023-12-30 LAB — MAGNESIUM: Magnesium: 2.5 mg/dL — ABNORMAL HIGH (ref 1.7–2.4)

## 2023-12-30 LAB — PHOSPHORUS: Phosphorus: <1 mg/dL — CL (ref 2.5–4.6)

## 2023-12-30 MED ORDER — K PHOS MONO-SOD PHOS DI & MONO 155-852-130 MG PO TABS
250.0000 mg | ORAL_TABLET | Freq: Three times a day (TID) | ORAL | Status: DC
  Administered 2023-12-30 – 2023-12-31 (×5): 250 mg via ORAL
  Filled 2023-12-30 (×5): qty 1

## 2023-12-30 MED ORDER — POTASSIUM CHLORIDE 20 MEQ PO PACK: 40.0000 meq | PACK | Freq: Once | ORAL | Status: DC

## 2023-12-30 MED ORDER — MELATONIN 5 MG PO TABS
5.0000 mg | ORAL_TABLET | Freq: Once | ORAL | Status: AC
  Administered 2023-12-30: 5 mg via ORAL
  Filled 2023-12-30: qty 1

## 2023-12-30 MED ORDER — TRIMETHOBENZAMIDE HCL 100 MG/ML IM SOLN
200.0000 mg | Freq: Three times a day (TID) | INTRAMUSCULAR | Status: DC | PRN
Start: 2023-12-30 — End: 2024-01-01
  Administered 2023-12-30 (×2): 200 mg via INTRAMUSCULAR
  Filled 2023-12-30 (×4): qty 2

## 2023-12-30 MED ORDER — POTASSIUM CHLORIDE 10 MEQ/100ML IV SOLN
10.0000 meq | INTRAVENOUS | Status: AC
  Administered 2023-12-30 (×3): 10 meq via INTRAVENOUS
  Filled 2023-12-30 (×3): qty 100

## 2023-12-30 MED ORDER — ORAL CARE MOUTH RINSE: 15.0000 mL | OROMUCOSAL | Status: DC | PRN

## 2023-12-30 MED ORDER — POTASSIUM PHOSPHATES 15 MMOLE/5ML IV SOLN
30.0000 mmol | Freq: Once | INTRAVENOUS | Status: AC
  Administered 2023-12-30: 30 mmol via INTRAVENOUS
  Filled 2023-12-30: qty 10

## 2023-12-30 NOTE — Progress Notes (Signed)
 PROGRESS NOTE    Angela Dixon  FMW:[id number] DOB: 12/03/1985 DOA: 12/28/2023 PCP: Billy Philippe SAUNDERS, NP   Brief Narrative:  This 38 years old female with PMH significant for hyperlipidemia, GERD, alcohol use, ADHD, anxiety, depression, obesity, IBS presented in the ED with complaints of nausea and vomiting for last 3 days.  Patient does have a history of cannabis hyperemesis syndrome with her last use being via Vape product in days prior to symptom onset.  She was also started on Wegovy 2 months ago.  Patient has been reporting decreased oral intake.  She is found to have significant electrolyte abnormalities on labs in the ED :  potassium 2.8, chloride 90, bicarb 20, gap 30, creatinine 3.5, calcium 11.5 CBC shows leukocytosis 18.9, thrombocytosis 414. CT A&P shows no acute abnormality.  Patient is admitted for further evaluation and started on IV hydration and IV Zofran  for nausea and vomiting.  Assessment & Plan:   Principal Problem:   AKI (acute kidney injury) Active Problems:   Depression   Generalized anxiety disorder   GERD (gastroesophageal reflux disease)   Irritable bowel syndrome (IBS)   Excessive drinking of alcohol   Morbid obesity with BMI of 40.0-44.9, adult (HCC)   Mixed hyperlipidemia   Prolonged QT interval   Hypokalemia   Hypomagnesemia   Leukocytosis   Erythrocytosis   Thrombocytosis   High anion gap metabolic acidosis   Nausea and vomiting: Electrolyte abnormalities (Hypokalemia, Hypomagnesemia): Acute kidney injury: High anion gap metabolic acidosis: Patient presented with 3 days of nausea, vomiting and decreased intake.   Found to have creatinine elevated to 3.5 from baseline of 0.8.   She is also noted to have potassium of 2.8,  magnesium  of 1.3. elevated gap at 30.   Patient has history of hyperemesis secondary to marijuana, used a vape product in the days prior to symptom onset. She was also started on Wegovy 2 months ago. CT A/P showed no acute  abnormality including no obstructive explanation for renal failure. Continue IV fluid resuscitation.  Patient was given replacement for low potassium,  low magnesium . Continue IV Zofran  as needed for nausea and vomiting. Trial of scheduled gabapentin  and PRN versed  for Nausea while QTc is Prolonged. Hold Wegovy. She continued to complain about persistent nausea and vomiting. Trial of Tigan  as needed.  Labs improving.  AKI resolved.  Leukocytosis / Erythrocytosis/ Thrombocytosis No infectious etiology is identified on CT CBC shows improved cell lines.   Prolonged QTc EKG in the ED shows QTc prolongation of 580.   Avoid QTc prolonging medications Repeat EKG shows improving QTc   Hyperlipidemia Not on any medication.   GERD: Continue home PPI as tolerated.   ADHD / Anxiety / Depression: Holding Adderall and Abilify in the setting of prolonged QTc Continue BuSpar  and fluoxetine  when tolerated   DVT prophylaxis: Lovenox  Code Status: Full code Family Communication: No family at bedside Disposition Plan:    Status is: Inpatient Remains inpatient appropriate because: Severity of illness.  Consultants:  None  Procedures: CT A/P  Antimicrobials:  Anti-infectives (From admission, onward)    None       Subjective: Patient was seen and examined at bedside.  Overnight events noted. Patient was lying comfortably on the bed and states still having significant nausea and vomiting.  Objective: Vitals:   12/29/23 1340 12/29/23 2154 12/30/23 0522 12/30/23 1320  BP: (!) 151/87 131/80 (!) 148/90 (!) 151/91  Pulse: (!) 55 72 69 (!) 58  Resp: 18 18 18  17  Temp: 98 F (36.7 C) 98.4 F (36.9 C) 98.3 F (36.8 C) 98.2 F (36.8 C)  TempSrc: Oral Oral Oral Oral  SpO2: 100% 100% 100% 100%  Weight:      Height:        Intake/Output Summary (Last 24 hours) at 12/30/2023 1348 Last data filed at 12/29/2023 2200 Gross per 24 hour  Intake 100 ml  Output --  Net 100 ml   Filed  Weights   12/28/23 1442  Weight: 85.7 kg    Examination:  General exam: Appears calm and comfortable, not in any acute distress. Respiratory system: CTA bilaterally. Respiratory effort normal.  RR 14 Cardiovascular system: S1 & S2 heard, RRR. No JVD, murmurs, rubs, gallops or clicks.  Gastrointestinal system: Abdomen is non distended, soft and non tender.  Normal bowel sounds heard. Central nervous system: Alert and oriented X 3. No focal neurological deficits. Extremities: No edema, no cyanosis, no clubbing. Skin: No rashes, lesions or ulcers Psychiatry: Judgement and insight appear normal. Mood & affect appropriate.     Data Reviewed: I have personally reviewed following labs and imaging studies  CBC: Recent Labs  Lab 12/28/23 0919 12/29/23 0335 12/30/23 0924  WBC 18.9* 12.0* 9.4  NEUTROABS 15.8*  --   --   HGB 17.2* 13.6 13.3  HCT 46.9* 41.9 38.9  MCV 82.9 89.9 88.8  PLT 414* 258 228   Basic Metabolic Panel: Recent Labs  Lab 12/28/23 0919 12/28/23 1547 12/29/23 0335 12/30/23 0924  NA 139 141 145 143  K 2.8* 3.2* 3.0* 2.9*  CL 90* 99 105 103  CO2 20* 19* 23 24  GLUCOSE 131* 108* 80 99  BUN 34* 26* 16 8  CREATININE 3.53* 1.94* 1.08* 0.70  CALCIUM 11.5* 9.3 8.3* 8.6*  MG 1.3* 2.5*  --  2.5*  PHOS  --   --   --  <1.0*   GFR: Estimated Creatinine Clearance: 100 mL/min (by C-G formula based on SCr of 0.7 mg/dL). Liver Function Tests: Recent Labs  Lab 12/28/23 0919 12/29/23 0335  AST 48* 42*  ALT 29 21  ALKPHOS 67 44  BILITOT 0.7 0.8  PROT 10.3* 6.9  ALBUMIN 7.6* 4.4   Recent Labs  Lab 12/28/23 0919  LIPASE 28   No results for input(s): AMMONIA in the last 168 hours. Coagulation Profile: No results for input(s): INR, PROTIME in the last 168 hours. Cardiac Enzymes: No results for input(s): CKTOTAL, CKMB, CKMBINDEX, TROPONINI in the last 168 hours. BNP (last 3 results) No results for input(s): PROBNP in the last 8760  hours. HbA1C: No results for input(s): HGBA1C in the last 72 hours. CBG: No results for input(s): GLUCAP in the last 168 hours. Lipid Profile: No results for input(s): CHOL, HDL, LDLCALC, TRIG, CHOLHDL, LDLDIRECT in the last 72 hours. Thyroid  Function Tests: No results for input(s): TSH, T4TOTAL, FREET4, T3FREE, THYROIDAB in the last 72 hours. Anemia Panel: No results for input(s): VITAMINB12, FOLATE, FERRITIN, TIBC, IRON, RETICCTPCT in the last 72 hours. Sepsis Labs: Recent Labs  Lab 12/28/23 1547  LATICACIDVEN 1.2    No results found for this or any previous visit (from the past 240 hours).    Radiology Studies: No results found.  Scheduled Meds:  busPIRone   15 mg Oral BID   enoxaparin  (LOVENOX ) injection  40 mg Subcutaneous Q24H   FLUoxetine   40 mg Oral Daily   gabapentin   300 mg Oral BID   pantoprazole   40 mg Oral BID AC   phosphorus  250  mg Oral TID   sodium chloride  flush  3 mL Intravenous Q12H   Continuous Infusions:  potassium chloride  10 mEq (12/30/23 1242)   potassium PHOSPHATE  IVPB (in mmol)       LOS: 2 days    Time spent: 35 mins    Darcel Dawley, MD [facility]   If 7PM-7AM, please contact night-coverage

## 2023-12-31 LAB — CBC
HCT: 44.6 % (ref 36.0–46.0)
Hemoglobin: 14.4 g/dL (ref 12.0–15.0)
MCH: 29.1 pg (ref 26.0–34.0)
MCHC: 32.3 g/dL (ref 30.0–36.0)
MCV: 90.3 fL (ref 80.0–100.0)
Platelets: 240 K/uL (ref 150–400)
RBC: 4.94 MIL/uL (ref 3.87–5.11)
RDW: 12.6 % (ref 11.5–15.5)
WBC: 8.9 K/uL (ref 4.0–10.5)
nRBC: 0 % (ref 0.0–0.2)

## 2023-12-31 LAB — BASIC METABOLIC PANEL WITH GFR
Anion gap: 18 — ABNORMAL HIGH (ref 5–15)
BUN: 7 mg/dL (ref 6–20)
CO2: 24 mmol/L (ref 22–32)
Calcium: 8.9 mg/dL (ref 8.9–10.3)
Chloride: 100 mmol/L (ref 98–111)
Creatinine, Ser: 0.68 mg/dL (ref 0.44–1.00)
GFR, Estimated: >60 mL/min (ref >60)
Glucose, Bld: 92 mg/dL (ref 70–99)
Potassium: 2.9 mmol/L — ABNORMAL LOW (ref 3.5–5.1)
Sodium: 142 mmol/L (ref 135–145)

## 2023-12-31 LAB — MAGNESIUM: Magnesium: 2.6 mg/dL — ABNORMAL HIGH (ref 1.7–2.4)

## 2023-12-31 LAB — PHOSPHORUS: Phosphorus: 2.3 mg/dL — ABNORMAL LOW (ref 2.5–4.6)

## 2023-12-31 MED ORDER — POTASSIUM PHOSPHATES 15 MMOLE/5ML IV SOLN
15.0000 mmol | Freq: Once | INTRAVENOUS | Status: AC
  Administered 2023-12-31: 15 mmol via INTRAVENOUS
  Filled 2023-12-31: qty 5

## 2023-12-31 MED ORDER — POTASSIUM CHLORIDE 10 MEQ/100ML IV SOLN
10.0000 meq | INTRAVENOUS | Status: DC
  Administered 2023-12-31: 10 meq via INTRAVENOUS
  Filled 2023-12-31 (×3): qty 100

## 2023-12-31 MED ORDER — POTASSIUM CHLORIDE CRYS ER 20 MEQ PO TBCR
40.0000 meq | EXTENDED_RELEASE_TABLET | ORAL | Status: AC
  Administered 2023-12-31 (×2): 40 meq via ORAL
  Filled 2023-12-31 (×2): qty 2

## 2023-12-31 NOTE — Progress Notes (Signed)
   12/31/23 0844  TOC Brief Assessment  Insurance and Status Reviewed  Patient has primary care physician Yes  Home environment has been reviewed Resides in an apartment with child(ren)  Prior level of function: Independent with ADLs  Prior/Current Home Services No current home services  Social Drivers of Health Review SDOH reviewed no interventions necessary  Readmission risk has been reviewed Yes  Transition of care needs no transition of care needs at this time

## 2023-12-31 NOTE — Progress Notes (Signed)
 PROGRESS NOTE    Angela Dixon  FMW:[id number] DOB: 1985/07/24 DOA: 12/28/2023 PCP: Billy Philippe SAUNDERS, NP    Brief Narrative:   Angela Dixon is a 38 y.o. female with past medical history significant for ADHD, anxiety/depression, HLD, GERD, alcohol, THC use with resultant cannabis hyperemesis syndrome, IBS, obesity who presented to [facility] ED on 12/28/2023 with recurrent intractable nausea and vomiting x 3 days.  Patient does have history of cannabis hyperemesis from drome, also was recently started on Wegovy 2 months prior.  Denies fever, no chest pain, no shortness of breath, no abdominal pain, no constipation or diarrhea.  In the ED, temperature 98.3 F, HR 88, RR 18, BP 134/98, SpO2 100% on room air.  WBC 18.9, hemoglobin 17.2, platelet count 414.  Sodium 139, potassium 2.8, chloride 90, CO2 20, glucose 131, BUN 34, creatinine 3.53.  Lipase 28.  AST 48, ALT 29, total bilirubin 0.7.  Lactic acid 1.2.  hCG negative.  Urinalysis with 15 ketones, negative leukocyte/nitrite, rare bacteria, 11-20 WBCs.  CT abdomen/pelvis without contrast with no acute findings in the abdomen/pelvis. Patient received 2 L IV fluids and started on rate IV fluids. Received Zofran  and Pepcid  as well as Versed . Also received 10 mEq IV potassium and 2 g IV magnesium .  TRH was consulted and patient was transferred to [facility] for further evaluation and management.  Assessment & Plan:   Intractable nausea/vomiting likely secondary to cannabinoid hyperemesis syndrome Patient presenting with recurrent nausea and vomiting x 3 days.  Does have history of cannabinoid hyperemesis syndrome with continued use of THC.  Also complicated by use of Wegovy, recently started 2 months ago.  Patient with elevated WBC count of 18.9, likely reactive in the setting of severe dehydration.  Lipase within normal limits.  Lactic acid normal.  hCG negative.  Urinalysis unrevealing.  CT abdomen/pelvis with no acute findings. --  Advance to soft diet today -- Tigan  200 mg IM every 8 hours as needed nausea/vomiting (need to avoid other antiemetics due to QTc prolongation) -- Versed  0.5 mg IV every 3 hours as needed nausea/vomiting not relieved with Tigan  -- Electrolyte replacement as below  Hypokalemia Hypomagnesemia Hypophosphatemia Etiology likely secondary to intractable nausea/vomiting, poor oral intake in the days preceding hospitalization. -- Advancing diet as above -- Continue electrolyte replacement -- BMP with mag/Phos level daily  ADHD Anxiety/depression -- Fluoxetine  40 mg p.o. daily  -- BuSpar  15 mg p.o. twice daily -- Holding home Abilify due to QTc prolongation -- Resume Adderall on discharge  HLD Currently not on statin outpatient.  GERD -- Protonix  40 mg p.o. twice daily  Alcohol/THC use disorder Counseled on need for complete cessation/abstinence.  Obesity, class I On [facility] outpatient, likely will need to hold this medication given symptoms as above.  Outpatient follow-up with PCP.   DVT prophylaxis: enoxaparin  (LOVENOX ) injection 40 mg Start: 12/29/23 1800    Code Status: Full Code Family Communication: No family present at bedside this morning  Disposition Plan:  Level of care: Progressive Status is: Inpatient Remains inpatient appropriate because: Electrolyte replacement, advancing diet, anticipate discharge home in 1-2 days    Consultants:  None  Procedures:  None  Antimicrobials:  None   Subjective: Patient seen examined bedside, lying in bed.  Continues with some mild nausea.  Tolerating full liquid diet.  Discussed will further advance diet today to soft.  Also discussed with RN, need to repeat EKG to determine if patient has continued QTc prolongation, if not may be able  to utilize other antiemetics at this point.  Patient with noor complaints, questions, or concerns at this time.  Denies headache, no dizziness, no chest pain, no shortness of breath, no abdominal  pain, no fever/chills/night sweats, no current vomiting, no diarrhea, no focal weakness, no fatigue, no paresthesias.  No acute events overnight per nursing staff.  Objective: Vitals:   12/30/23 2041 12/31/23 0418 12/31/23 0728 12/31/23 0810  BP: (!) 160/88 (!) 157/96 (!) 157/93 (!) 154/98  Pulse: (!) 56 66 (!) 59 63  Resp: 16 16 17    Temp: 98.6 F (37 C) 97.8 F (36.6 C) 98.4 F (36.9 C)   TempSrc: Oral Oral Oral   SpO2: 100% 100% 100%   Weight:      Height:        Intake/Output Summary (Last 24 hours) at 12/31/2023 1239 Last data filed at 12/31/2023 0840 Gross per 24 hour  Intake 703.6 ml  Output 1250 ml  Net -546.4 ml   Filed Weights   12/28/23 1442  Weight: 85.7 kg    Examination:  Physical Exam: GEN: NAD, alert and oriented x 3, obese HEENT: NCAT, PERRL, EOMI, sclera clear, MMM PULM: CTAB w/o wheezes/crackles, normal respiratory effort, on room air CV: RRR w/o M/G/R GI: abd soft, NTND, + BS MSK: no peripheral edema, muscle strength globally intact 5/5 bilateral upper/lower extremities NEURO: CN II-XII intact, no focal deficits, sensation to light touch intact PSYCH: normal mood/affect Integumentary: dry/intact, no rashes or wounds    Data Reviewed: I have personally reviewed following labs and imaging studies  CBC: Recent Labs  Lab 12/28/23 0919 12/29/23 0335 12/30/23 0924 12/31/23 0402  WBC 18.9* 12.0* 9.4 8.9  NEUTROABS 15.8*  --   --   --   HGB 17.2* 13.6 13.3 14.4  HCT 46.9* 41.9 38.9 44.6  MCV 82.9 89.9 88.8 90.3  PLT 414* 258 228 240   Basic Metabolic Panel: Recent Labs  Lab 12/28/23 0919 12/28/23 1547 12/29/23 0335 12/30/23 0924 12/31/23 0402  NA 139 141 145 143 142  K 2.8* 3.2* 3.0* 2.9* 2.9*  CL 90* 99 105 103 100  CO2 20* 19* 23 24 24   GLUCOSE 131* 108* 80 99 92  BUN 34* 26* 16 8 7   CREATININE 3.53* 1.94* 1.08* 0.70 0.68  CALCIUM 11.5* 9.3 8.3* 8.6* 8.9  MG 1.3* 2.5*  --  2.5* 2.6*  PHOS  --   --   --  <1.0* 2.3*    GFR: Estimated Creatinine Clearance: 100 mL/min (by C-G formula based on SCr of 0.68 mg/dL). Liver Function Tests: Recent Labs  Lab 12/28/23 0919 12/29/23 0335  AST 48* 42*  ALT 29 21  ALKPHOS 67 44  BILITOT 0.7 0.8  PROT 10.3* 6.9  ALBUMIN 7.6* 4.4   Recent Labs  Lab 12/28/23 0919  LIPASE 28   No results for input(s): AMMONIA in the last 168 hours. Coagulation Profile: No results for input(s): INR, PROTIME in the last 168 hours. Cardiac Enzymes: No results for input(s): CKTOTAL, CKMB, CKMBINDEX, TROPONINI in the last 168 hours. BNP (last 3 results) No results for input(s): PROBNP in the last 8760 hours. HbA1C: No results for input(s): HGBA1C in the last 72 hours. CBG: No results for input(s): GLUCAP in the last 168 hours. Lipid Profile: No results for input(s): CHOL, HDL, LDLCALC, TRIG, CHOLHDL, LDLDIRECT in the last 72 hours. Thyroid  Function Tests: No results for input(s): TSH, T4TOTAL, FREET4, T3FREE, THYROIDAB in the last 72 hours. Anemia Panel: No results for input(s):  VITAMINB12, FOLATE, FERRITIN, TIBC, IRON, RETICCTPCT in the last 72 hours. Sepsis Labs: Recent Labs  Lab 12/28/23 1547  LATICACIDVEN 1.2    No results found for this or any previous visit (from the past 240 hours).       Radiology Studies: No results found.      Scheduled Meds:  busPIRone   15 mg Oral BID   enoxaparin  (LOVENOX ) injection  40 mg Subcutaneous Q24H   FLUoxetine   40 mg Oral Daily   gabapentin   300 mg Oral BID   pantoprazole   40 mg Oral BID AC   phosphorus  250 mg Oral TID   sodium chloride  flush  3 mL Intravenous Q12H   Continuous Infusions:  potassium chloride      potassium PHOSPHATE  IVPB (in mmol) 15 mmol (12/31/23 1157)     LOS: 3 days    Time spent: 48 minutes spent on 12/31/2023 caring for this patient face-to-face including chart review, ordering labs/tests, documenting, discussion with nursing  staff, consultants, updating family and interview/physical exam    Camellia PARAS [place], DO [facility] Available via Epic secure chat 7am-7pm After these hours, please refer to coverage provider listed on [email or web address] 12/31/2023, 12:39 PM

## 2024-01-01 LAB — MAGNESIUM: Magnesium: 2.2 mg/dL (ref 1.7–2.4)

## 2024-01-01 LAB — BASIC METABOLIC PANEL WITH GFR
Anion gap: 16 — ABNORMAL HIGH (ref 5–15)
BUN: 7 mg/dL (ref 6–20)
CO2: 25 mmol/L (ref 22–32)
Calcium: 8.9 mg/dL (ref 8.9–10.3)
Chloride: 97 mmol/L — ABNORMAL LOW (ref 98–111)
Creatinine, Ser: 0.61 mg/dL (ref 0.44–1.00)
GFR, Estimated: >60 mL/min (ref >60)
Glucose, Bld: 92 mg/dL (ref 70–99)
Potassium: 3.2 mmol/L — ABNORMAL LOW (ref 3.5–5.1)
Sodium: 137 mmol/L (ref 135–145)

## 2024-01-01 LAB — PHOSPHORUS: Phosphorus: 2.1 mg/dL — ABNORMAL LOW (ref 2.5–4.6)

## 2024-01-01 MED ORDER — POTASSIUM CHLORIDE CRYS ER 20 MEQ PO TBCR
40.0000 meq | EXTENDED_RELEASE_TABLET | ORAL | Status: AC
  Administered 2024-01-01 (×2): 40 meq via ORAL
  Filled 2024-01-01 (×2): qty 2

## 2024-01-01 MED ORDER — POTASSIUM PHOSPHATES 15 MMOLE/5ML IV SOLN
15.0000 mmol | Freq: Once | INTRAVENOUS | Status: AC
  Administered 2024-01-01: 15 mmol via INTRAVENOUS
  Filled 2024-01-01: qty 5

## 2024-01-01 NOTE — Discharge Summary (Signed)
 Physician Discharge Summary  Angela Dixon FMW:[id number] DOB: Aug 11, 1985 DOA: 12/28/2023  PCP: Billy Philippe SAUNDERS, NP  Admit date: 12/28/2023 Discharge date: 01/01/2024  Admitted From: Home Disposition: Home  Recommendations for Outpatient Follow-up:  Follow up with PCP in 1-2 weeks Continue to hold Wegovy until follow-up with PCP Continue to encourage abstinence from [facility] is likely contributing factor in addition to [facility] for intractable nausea and vomiting Please obtain BMP, magnesium  and phosphorus level 1 week to ensure electrolytes remain stable  Home Health: No Equipment/Devices: None  Discharge Condition: Stable CODE STATUS: Full code Diet recommendation: Regular diet  History of present illness:  Angela Dixon is a 38 y.o. female with past medical history significant for ADHD, anxiety/depression, HLD, GERD, alcohol, THC use with resultant cannabis hyperemesis syndrome, IBS, obesity who presented to [facility] ED on 12/28/2023 with recurrent intractable nausea and vomiting x 3 days.  Patient does have history of cannabis hyperemesis from drome, also was recently started on Wegovy 2 months prior.  Denies fever, no chest pain, no shortness of breath, no abdominal pain, no constipation or diarrhea.   In the ED, temperature 98.3 F, HR 88, RR 18, BP 134/98, SpO2 100% on room air.  WBC 18.9, hemoglobin 17.2, platelet count 414.  Sodium 139, potassium 2.8, chloride 90, CO2 20, glucose 131, BUN 34, creatinine 3.53.  Lipase 28.  AST 48, ALT 29, total bilirubin 0.7.  Lactic acid 1.2.  hCG negative.  Urinalysis with 15 ketones, negative leukocyte/nitrite, rare bacteria, 11-20 WBCs.  CT abdomen/pelvis without contrast with no acute findings in the abdomen/pelvis. Patient received 2 L IV fluids and started on rate IV fluids. Received Zofran  and Pepcid  as well as Versed . Also received 10 mEq IV potassium and 2 g IV magnesium .  TRH was consulted and patient was transferred to [facility] for further evaluation and management.  Hospital course:  Intractable nausea/vomiting likely secondary to cannabinoid hyperemesis syndrome Patient presenting with recurrent nausea and vomiting x 3 days.  Does have history of cannabinoid hyperemesis syndrome with continued use of THC.  Also complicated by use of Wegovy, recently started 2 months ago.  Patient with elevated WBC count of 18.9, likely reactive in the setting of severe dehydration.  Lipase within normal limits.  Lactic acid normal.  hCG negative.  Urinalysis unrevealing.  CT abdomen/pelvis with no acute findings.  Patient was supported with IV fluids, aggressive electrolyte replacement and antiemetics with improvement of symptoms.  Patient's diet was slowly advanced with toleration.  Discussed with patient needs to hold [facility] until follow-up with PCP and to avoid [facility] is likely contributing factors to her intractable nausea and vomiting.  Recommend repeat BMP with magnesium  and phosphorus level in 1 week.   Hypokalemia Hypomagnesemia Hypophosphatemia Etiology likely secondary to intractable nausea/vomiting, poor oral intake in the days preceding hospitalization.  Repleted aggressively during hospitalization.  Recommend repeat BMP, magnesium , phosphorus level 1 week.   ADHD Anxiety/depression Continue home fluoxetine  40 mg p.o. daily, BuSpar  15 mg p.o. twice daily, Abilify 5 mg p.o. daily, Adderall.   HLD Currently not on statin outpatient.   GERD Protonix  40 mg p.o. twice daily   Alcohol/THC use disorder Counseled on need for complete cessation/abstinence.   Obesity, class I On [facility] outpatient, likely will need to hold this medication given symptoms as above.  Outpatient follow-up with PCP.  Discharge Diagnoses:  Principal Problem:   AKI (acute kidney injury) Active Problems:   Depression   Generalized anxiety disorder   GERD (  gastroesophageal reflux disease)   Irritable bowel syndrome (IBS)   Excessive  drinking of alcohol   Morbid obesity with BMI of 40.0-44.9, adult (HCC)   Mixed hyperlipidemia   Prolonged QT interval   Hypokalemia   Hypomagnesemia   Leukocytosis   Erythrocytosis   Thrombocytosis   High anion gap metabolic acidosis    Discharge Instructions  Discharge Instructions     Call MD for:  difficulty breathing, headache or visual disturbances   Complete by: As directed    Call MD for:  extreme fatigue   Complete by: As directed    Call MD for:  persistant dizziness or light-headedness   Complete by: As directed    Call MD for:  persistant nausea and vomiting   Complete by: As directed    Call MD for:  severe uncontrolled pain   Complete by: As directed    Call MD for:  temperature >100.4   Complete by: As directed    Diet - low sodium heart healthy   Complete by: As directed    Increase activity slowly   Complete by: As directed       Allergies as of 01/01/2024       Reactions   Wellbutrin  [bupropion ] Hives   Zoloft  [sertraline ] Other (See Comments)   Excessive sweating, insomnia   Neomycin Rash        Medication List     PAUSE taking these medications    Wegovy 1 MG/0.5ML Soaj SQ injection Wait to take this until your doctor or other care provider tells you to start again. Generic drug: semaglutide-weight management Inject 1 mg into the skin once a week.       TAKE these medications    acetaminophen  500 MG tablet Commonly known as: TYLENOL  Take 1,000 mg by mouth every 6 (six) hours as needed for moderate pain (pain score 4-6) or mild pain (pain score 1-3).   ADVIL  COLD & SINUS LIQUI-GELS PO Take 2 capsules by mouth as needed.   albuterol  108 (90 Base) MCG/ACT inhaler Commonly known as: VENTOLIN  HFA Inhale 2 puffs into the lungs every 4 (four) hours as needed for wheezing or shortness of breath.   amphetamine-dextroamphetamine 30 MG 24 hr capsule Commonly known as: ADDERALL XR Take 30 mg by mouth in the morning.    amphetamine-dextroamphetamine 10 MG tablet Commonly known as: ADDERALL Take 10 mg by mouth daily.   ARIPiprazole 5 MG tablet Commonly known as: ABILIFY Take 5 mg by mouth in the morning.   busPIRone  15 MG tablet Commonly known as: BUSPAR  Take 1 tablet (15 mg total) by mouth 2 (two) times daily.   calcium carbonate 500 MG chewable tablet Commonly known as: TUMS - dosed in mg elemental calcium Chew 1 tablet by mouth as needed for indigestion or heartburn.   cetirizine 10 MG tablet Commonly known as: ZYRTEC Take 10 mg by mouth in the morning.   FLUoxetine  40 MG capsule Commonly known as: PROZAC  Take 40 mg by mouth in the morning.   folic acid 1 MG tablet Commonly known as: FOLVITE Take 1 mg by mouth in the morning.   Mirena (52 MG) 20 MCG/DAY Iud Generic drug: levonorgestrel 1 each by Intrauterine route once.   pantoprazole  40 MG tablet Commonly known as: PROTONIX  TAKE 1 TABLET (40 MG TOTAL) BY MOUTH TWICE A DAY BEFORE MEALS What changed: See the new instructions.        Follow-up Information     Billy Philippe SAUNDERS, NP. Schedule  an appointment as soon as possible for a visit in 1 week(s).   Specialty: Family Medicine Contact information: [address] [place] [address] [phone]                Allergies  Allergen Reactions   Wellbutrin  [Bupropion ] Hives   Zoloft  [Sertraline ] Other (See Comments)    Excessive sweating, insomnia   Neomycin Rash    Consultations: None   Procedures/Studies: CT ABDOMEN PELVIS WO CONTRAST Result Date: 12/28/2023 CLINICAL DATA:  Epigastric pain with nausea and vomiting. Acute renal insufficiency. Evaluate for postobstructive source. EXAM: CT ABDOMEN AND PELVIS WITHOUT CONTRAST TECHNIQUE: Multidetector CT imaging of the abdomen and pelvis was performed following the standard protocol without IV contrast. RADIATION DOSE REDUCTION: This exam was performed according to the departmental dose-optimization  program which includes automated exposure control, adjustment of the mA and/or kV according to patient size and/or use of iterative reconstruction technique. COMPARISON:  07/09/2020, 02/24/2019 FINDINGS: Lower chest: Heart is normal size.  Lung bases are clear. Hepatobiliary: Liver, gallbladder and biliary tree are normal. Pancreas: Normal. Spleen: Normal. Adrenals/Urinary Tract: Adrenal glands are normal. Kidneys are normal in size without hydronephrosis or nephrolithiasis. Ureters and bladder are normal. Stomach/Bowel: Stomach and small bowel are normal. Appendix is normal colon is normal. Vascular/Lymphatic: Abdominal aorta is normal in caliber. Remaining vascular structures are unremarkable. No adenopathy. Reproductive: IUD in adequate position.  Ovaries are normal. Other: No free fluid or focal inflammatory change. Musculoskeletal: No focal abnormality. Minimal sclerosis of the sacroiliac joints. Mild disc disease with endplate sclerosis at the T11-12 level. IMPRESSION: No acute findings in the abdomen/pelvis. Electronically Signed   By: Toribio Agreste M.D.   On: 12/28/2023 10:59     Subjective: Patient seen examined bedside, lying in bed.  Tolerating diet.  States ready for discharge home.  Discussed to hold home Wegovy on discharge until follow-up with PCP.  Encouraged THC abstinence.  No other questions or concerns at this time.  Denies headache, no dizziness, no chest pain, no palpitations, no fever/chills/night sweats, no nausea/vomiting/diarrhea, no abdominal pain, no shortness of breath, no focal weakness, no fatigue, no paresthesia.  No acute events overnight per nurse staff.  Discharge Exam: Vitals:   12/31/23 2125 01/01/24 0546  BP: (!) 152/92 (!) 152/98  Pulse: (!) 57 (!) 59  Resp: 18 20  Temp: 99.2 F (37.3 C) 98.7 F (37.1 C)  SpO2: 100% 100%   Vitals:   12/31/23 0810 12/31/23 1414 12/31/23 2125 01/01/24 0546  BP: (!) 154/98 (!) 151/94 (!) 152/92 (!) 152/98  Pulse: 63 60 (!) 57  (!) 59  Resp:   18 20  Temp:  98.4 F (36.9 C) 99.2 F (37.3 C) 98.7 F (37.1 C)  TempSrc:  Oral Oral Oral  SpO2:  100% 100% 100%  Weight:      Height:        Physical Exam: GEN: NAD, alert and oriented x 3, recent HEENT: NCAT, PERRL, EOMI, sclera clear, MMM PULM: CTAB w/o wheezes/crackles, normal respiratory effort, on room air CV: RRR w/o M/G/R GI: abd soft, NTND, + BS MSK: no peripheral edema, muscle strength globally intact 5/5 bilateral upper/lower extremities NEURO: CN II-XII intact, no focal deficits, sensation to light touch intact PSYCH: normal mood/affect Integumentary: dry/intact, no rashes or wounds    The results of significant diagnostics from this hospitalization (including imaging, microbiology, ancillary and laboratory) are listed below for reference.     Microbiology: No results found for this or any  previous visit (from the past 240 hours).   Labs: BNP (last 3 results) No results for input(s): BNP in the last 8760 hours. Basic Metabolic Panel: Recent Labs  Lab 12/28/23 0919 12/28/23 1547 12/29/23 0335 12/30/23 0924 12/31/23 0402 01/01/24 0348  NA 139 141 145 143 142 137  K 2.8* 3.2* 3.0* 2.9* 2.9* 3.2*  CL 90* 99 105 103 100 97*  CO2 20* 19* 23 24 24 25   GLUCOSE 131* 108* 80 99 92 92  BUN 34* 26* 16 8 7 7   CREATININE 3.53* 1.94* 1.08* 0.70 0.68 0.61  CALCIUM 11.5* 9.3 8.3* 8.6* 8.9 8.9  MG 1.3* 2.5*  --  2.5* 2.6* 2.2  PHOS  --   --   --  <1.0* 2.3* 2.1*   Liver Function Tests: Recent Labs  Lab 12/28/23 0919 12/29/23 0335  AST 48* 42*  ALT 29 21  ALKPHOS 67 44  BILITOT 0.7 0.8  PROT 10.3* 6.9  ALBUMIN 7.6* 4.4   Recent Labs  Lab 12/28/23 0919  LIPASE 28   No results for input(s): AMMONIA in the last 168 hours. CBC: Recent Labs  Lab 12/28/23 0919 12/29/23 0335 12/30/23 0924 12/31/23 0402  WBC 18.9* 12.0* 9.4 8.9  NEUTROABS 15.8*  --   --   --   HGB 17.2* 13.6 13.3 14.4  HCT 46.9* 41.9 38.9 44.6  MCV 82.9 89.9  88.8 90.3  PLT 414* 258 228 240   Cardiac Enzymes: No results for input(s): CKTOTAL, CKMB, CKMBINDEX, TROPONINI in the last 168 hours. BNP: Invalid input(s): POCBNP CBG: No results for input(s): GLUCAP in the last 168 hours. D-Dimer No results for input(s): DDIMER in the last 72 hours. Hgb A1c No results for input(s): HGBA1C in the last 72 hours. Lipid Profile No results for input(s): CHOL, HDL, LDLCALC, TRIG, CHOLHDL, LDLDIRECT in the last 72 hours. Thyroid  function studies No results for input(s): TSH, T4TOTAL, T3FREE, THYROIDAB in the last 72 hours.  Invalid input(s): FREET3 Anemia work up No results for input(s): VITAMINB12, FOLATE, FERRITIN, TIBC, IRON, RETICCTPCT in the last 72 hours. Urinalysis    Component Value Date/Time   COLORURINE YELLOW 12/28/2023 1058   APPEARANCEUR HAZY (A) 12/28/2023 1058   LABSPEC 1.021 12/28/2023 1058   PHURINE 6.0 12/28/2023 1058   GLUCOSEU NEGATIVE 12/28/2023 1058   HGBUR LARGE (A) 12/28/2023 1058   BILIRUBINUR SMALL (A) 12/28/2023 1058   KETONESUR 15 (A) 12/28/2023 1058   PROTEINUR >300 (A) 12/28/2023 1058   UROBILINOGEN 0.2 12/09/2013 1226   NITRITE NEGATIVE 12/28/2023 1058   LEUKOCYTESUR NEGATIVE 12/28/2023 1058   Sepsis Labs Recent Labs  Lab 12/28/23 0919 12/29/23 0335 12/30/23 0924 12/31/23 0402  WBC 18.9* 12.0* 9.4 8.9   Microbiology No results found for this or any previous visit (from the past 240 hours).   Time coordinating discharge: Over 30 minutes  SIGNED:   Camellia PARAS [place], DO  [facility] 01/01/2024, 1:20 PM

## 2024-01-01 NOTE — Plan of Care (Signed)

## 2024-01-04 ENCOUNTER — Emergency Department (HOSPITAL_COMMUNITY)

## 2024-01-04 ENCOUNTER — Emergency Department (HOSPITAL_COMMUNITY)
Admission: EM | Admit: 2024-01-04 | Discharge: 2024-01-05 | Disposition: A | Discharge status: Home / Self Care | Attending: Emergency Medicine | Admitting: Emergency Medicine

## 2024-01-04 DIAGNOSIS — E876 Hypokalemia: Secondary | ICD-10-CM | POA: Insufficient documentation

## 2024-01-04 DIAGNOSIS — R112 Nausea with vomiting, unspecified: Secondary | ICD-10-CM | POA: Insufficient documentation

## 2024-01-04 DIAGNOSIS — R9431 Abnormal electrocardiogram [ECG] [EKG]: Secondary | ICD-10-CM | POA: Diagnosis not present

## 2024-01-04 LAB — URINALYSIS, ROUTINE W REFLEX MICROSCOPIC
Bilirubin Urine: NEGATIVE
Glucose, UA: NEGATIVE mg/dL
Hgb urine dipstick: NEGATIVE
Ketones, ur: 20 mg/dL — AB
Leukocytes,Ua: NEGATIVE
Nitrite: NEGATIVE
Protein, ur: 30 mg/dL — AB
Specific Gravity, Urine: 1.023 (ref 1.005–1.030)
pH: 6 (ref 5.0–8.0)

## 2024-01-04 LAB — URINE DRUG SCREEN
Amphetamines: NEGATIVE
Barbiturates: NEGATIVE
Benzodiazepines: NEGATIVE
Cocaine: NEGATIVE
Fentanyl: NEGATIVE
Methadone Scn, Ur: NEGATIVE
Opiates: NEGATIVE
Tetrahydrocannabinol: POSITIVE — AB

## 2024-01-04 LAB — CBC
HCT: 46.4 % — ABNORMAL HIGH (ref 36.0–46.0)
Hemoglobin: 15.3 g/dL — ABNORMAL HIGH (ref 12.0–15.0)
MCH: 28.8 pg (ref 26.0–34.0)
MCHC: 33 g/dL (ref 30.0–36.0)
MCV: 87.4 fL (ref 80.0–100.0)
Platelets: 267 K/uL (ref 150–400)
RBC: 5.31 MIL/uL — ABNORMAL HIGH (ref 3.87–5.11)
RDW: 12.3 % (ref 11.5–15.5)
WBC: 9.4 K/uL (ref 4.0–10.5)
nRBC: 0 % (ref 0.0–0.2)

## 2024-01-04 LAB — COMPREHENSIVE METABOLIC PANEL WITH GFR
ALT: 24 U/L (ref 0–44)
AST: 26 U/L (ref 15–41)
Albumin: 4.7 g/dL (ref 3.5–5.0)
Alkaline Phosphatase: 46 U/L (ref 38–126)
Anion gap: 17 — ABNORMAL HIGH (ref 5–15)
BUN: 5 mg/dL — ABNORMAL LOW (ref 6–20)
CO2: 24 mmol/L (ref 22–32)
Calcium: 10 mg/dL (ref 8.9–10.3)
Chloride: 94 mmol/L — ABNORMAL LOW (ref 98–111)
Creatinine, Ser: 0.8 mg/dL (ref 0.44–1.00)
GFR, Estimated: >60 mL/min (ref >60)
Glucose, Bld: 95 mg/dL (ref 70–99)
Potassium: 2.6 mmol/L — CL (ref 3.5–5.1)
Sodium: 135 mmol/L (ref 135–145)
Total Bilirubin: 0.5 mg/dL (ref 0.0–1.2)
Total Protein: 7.5 g/dL (ref 6.5–8.1)

## 2024-01-04 LAB — HCG, SERUM, QUALITATIVE: Preg, Serum: NEGATIVE

## 2024-01-04 LAB — MAGNESIUM: Magnesium: 1.4 mg/dL — ABNORMAL LOW (ref 1.7–2.4)

## 2024-01-04 LAB — LIPASE, BLOOD: Lipase: 19 U/L (ref 11–51)

## 2024-01-04 MED ORDER — POTASSIUM CHLORIDE CRYS ER 20 MEQ PO TBCR
40.0000 meq | EXTENDED_RELEASE_TABLET | Freq: Once | ORAL | Status: AC
  Administered 2024-01-04: 40 meq via ORAL
  Filled 2024-01-04: qty 2

## 2024-01-04 MED ORDER — LORAZEPAM 2 MG/ML IJ SOLN
1.0000 mg | Freq: Once | INTRAMUSCULAR | Status: AC
  Administered 2024-01-04: 1 mg via INTRAVENOUS
  Filled 2024-01-04: qty 1

## 2024-01-04 MED ORDER — POTASSIUM CHLORIDE 10 MEQ/100ML IV SOLN
10.0000 meq | INTRAVENOUS | Status: AC
  Administered 2024-01-04 (×3): 10 meq via INTRAVENOUS
  Filled 2024-01-04 (×3): qty 100

## 2024-01-04 MED ORDER — LORAZEPAM 1 MG PO TABS: 1.0000 mg | ORAL_TABLET | Freq: Four times a day (QID) | ORAL | 0 refills | Status: DC | PRN

## 2024-01-04 MED ORDER — MAGNESIUM SULFATE 2 GM/50ML IV SOLN
2.0000 g | Freq: Once | INTRAVENOUS | Status: AC
Start: 2024-01-04 — End: 2024-01-05
  Administered 2024-01-04: 2 g via INTRAVENOUS
  Filled 2024-01-04: qty 50

## 2024-01-04 MED ORDER — POTASSIUM CHLORIDE CRYS ER 20 MEQ PO TBCR: 20.0000 meq | EXTENDED_RELEASE_TABLET | Freq: Every day | ORAL | 0 refills | Status: DC

## 2024-01-04 MED ORDER — SODIUM CHLORIDE 0.9 % IV BOLUS
1000.0000 mL | Freq: Once | INTRAVENOUS | Status: AC
  Administered 2024-01-04: 1000 mL via INTRAVENOUS

## 2024-01-04 NOTE — ED Provider Notes (Signed)
 [place] EMERGENCY DEPARTMENT AT [facility] Provider Note   CSN: [id number] Arrival date & time: 01/04/24  1633     Patient presents with: Nausea   Angela Dixon is a 38 y.o. female history of cannabis hyperemesis here presenting with vomiting.  Patient was recently admitted for cannabis hyperemesis.  Unfortunately patient also has prolonged QT.  Patient had unremarkable CT abdomen pelvis at that time.  Patient was not discharged home with any medicine due to prolonged QT.  She states that for the last 2 to 3 days she has worsening vomiting.  Also diffuse abdominal cramps.  Patient denies using marijuana.   The history is provided by the patient.       Prior to Admission medications   Medication Sig Start Date End Date Taking? Authorizing Provider  acetaminophen  (TYLENOL ) 500 MG tablet Take 1,000 mg by mouth every 6 (six) hours as needed for moderate pain (pain score 4-6) or mild pain (pain score 1-3).    [provider]  albuterol  (VENTOLIN  HFA) 108 (90 Base) MCG/ACT inhaler Inhale 2 puffs into the lungs every 4 (four) hours as needed for wheezing or shortness of breath. 04/05/22   Wilkinson, Dana E, NP  amphetamine-dextroamphetamine (ADDERALL XR) 30 MG 24 hr capsule Take 30 mg by mouth in the morning.    [provider]  amphetamine-dextroamphetamine (ADDERALL) 10 MG tablet Take 10 mg by mouth daily.    [provider]  ARIPiprazole (ABILIFY) 5 MG tablet Take 5 mg by mouth in the morning.    [provider]  busPIRone  (BUSPAR ) 15 MG tablet Take 1 tablet (15 mg total) by mouth 2 (two) times daily. 03/10/23   Laurice President, NP  calcium carbonate (TUMS - DOSED IN MG ELEMENTAL CALCIUM) 500 MG chewable tablet Chew 1 tablet by mouth as needed for indigestion or heartburn.    [provider]  cetirizine (ZYRTEC) 10 MG tablet Take 10 mg by mouth in the morning.    [provider]  FLUoxetine  (PROZAC ) 40 MG capsule Take 40 mg by  mouth in the morning. 12/09/23   [provider]  folic acid (FOLVITE) 1 MG tablet Take 1 mg by mouth in the morning. 02/14/22   [provider]  levonorgestrel (MIRENA, 52 MG,) 20 MCG/DAY IUD 1 each by Intrauterine route once. 12/23/22   [provider]  pantoprazole  (PROTONIX ) 40 MG tablet TAKE 1 TABLET (40 MG TOTAL) BY MOUTH TWICE A DAY BEFORE MEALS Patient taking differently: Take 40 mg by mouth in the morning. 11/27/23   Craig Alan SAUNDERS, PA-C  Pseudoephedrine-Ibuprofen  (ADVIL  COLD & SINUS LIQUI-GELS PO) Take 2 capsules by mouth as needed.    [provider]  WEGOVY 1 MG/0.5ML SOAJ SQ injection Inject 1 mg into the skin once a week. 12/11/23   [provider]    Allergies: Wellbutrin  [bupropion ], Zoloft  [sertraline ], and Neomycin    Review of Systems  Gastrointestinal:  Positive for abdominal pain and vomiting.  All other systems reviewed and are negative.   Updated Vital Signs BP 126/79   Pulse 73   Temp 98 F (36.7 C) (Oral)   Resp 10   Ht 5' 4 (1.626 m)   Wt 86.2 kg   SpO2 100%   BMI 32.61 kg/m   Physical Exam Vitals and nursing note reviewed.  Constitutional:      Comments: Nauseated and vomiting  HENT:     Head: Normocephalic.     Nose: Nose normal.  Mouth/Throat:     Mouth: Mucous membranes are dry.  Eyes:     Extraocular Movements: Extraocular movements intact.     Pupils: Pupils are equal, round, and reactive to light.  Cardiovascular:     Rate and Rhythm: Normal rate and regular rhythm.     Pulses: Normal pulses.     Heart sounds: Normal heart sounds.  Pulmonary:     Effort: Pulmonary effort is normal.     Breath sounds: Normal breath sounds.  Abdominal:     General: Abdomen is flat.     Palpations: Abdomen is soft.  Musculoskeletal:        General: Normal range of motion.     Cervical back: Normal range of motion and neck supple.  Skin:    General: Skin is warm.     Capillary Refill: Capillary refill  takes less than 2 seconds.  Neurological:     General: No focal deficit present.     Mental Status: She is alert and oriented to person, place, and time.  Psychiatric:        Mood and Affect: Mood normal.        Behavior: Behavior normal.     (all labs ordered are listed, but only abnormal results are displayed) Labs Reviewed  COMPREHENSIVE METABOLIC PANEL WITH GFR - Abnormal; Notable for the following components:      Result Value   Potassium 2.6 (*)    Chloride 94 (*)    BUN 5 (*)    Anion gap 17 (*)    All other components within normal limits  CBC - Abnormal; Notable for the following components:   RBC 5.31 (*)    Hemoglobin 15.3 (*)    HCT 46.4 (*)    All other components within normal limits  URINALYSIS, ROUTINE W REFLEX MICROSCOPIC - Abnormal; Notable for the following components:   Color, Urine AMBER (*)    APPearance HAZY (*)    Ketones, ur 20 (*)    Protein, ur 30 (*)    Bacteria, UA RARE (*)    All other components within normal limits  MAGNESIUM  - Abnormal; Notable for the following components:   Magnesium  1.4 (*)    All other components within normal limits  URINE DRUG SCREEN - Abnormal; Notable for the following components:   Tetrahydrocannabinol POSITIVE (*)    All other components within normal limits  LIPASE, BLOOD  HCG, SERUM, QUALITATIVE  MAGNESIUM     EKG: EKG Interpretation Date/Time:  Sunday January 04 2024 20:32:31 EDT Ventricular Rate:  81 PR Interval:  140 QRS Duration:  110 QT Interval:  502 QTC Calculation: 583 R Axis:   87  Text Interpretation: Sinus rhythm Prolonged QT interval Prolonged QT  unchanged Confirmed by Patt Alm DEL [phone]) on 01/04/2024 9:37:28 PM  Radiology: ARCOLA Abd Portable 1 View Result Date: 01/04/2024 CLINICAL DATA:  Nausea and vomiting. EXAM: PORTABLE ABDOMEN - 1 VIEW COMPARISON:  May 10, 2018 FINDINGS: The bowel gas pattern is normal. A mild stool burden is noted. No radio-opaque calculi or other significant  radiographic abnormality are seen. An IUD is in place. IMPRESSION: Nonobstructive bowel gas pattern. Electronically Signed   By: Suzen Dials M.D.   On: 01/04/2024 21:44     Procedures   Medications Ordered in the ED  potassium chloride  10 mEq in 100 mL IVPB (10 mEq Intravenous New Bag/Given 01/04/24 2256)  magnesium  sulfate IVPB 2 g 50 mL (has no administration in time range)  sodium chloride   0.9 % bolus 1,000 mL (0 mLs Intravenous Stopped 01/04/24 2206)  LORazepam (ATIVAN) injection 1 mg (1 mg Intravenous Given 01/04/24 2050)  potassium chloride  SA (KLOR-CON  M) CR tablet 40 mEq (40 mEq Oral Given 01/04/24 2230)                                    Medical Decision Making Angela Dixon is a 38 y.o. female here presenting with vomiting and abdominal cramps.  Likely recurrent cannabis hyperemesis.  Will recheck labs and hydrate patient.  11:14 PM I reviewed patient's labs and potassium is 2.6.  Patient's magnesium  level is 1.4.  Unfortunately patient's QTc is prolonged at 580.  She received IV Ativan and felt better.  Will discharge home with Ativan and potassium.    Problems Addressed: Hypokalemia: acute illness or injury Hypomagnesemia: acute illness or injury Nausea and vomiting, unspecified vomiting type: acute illness or injury  Amount and/or Complexity of Data Reviewed Labs: ordered. Radiology: ordered and independent interpretation performed. ECG/medicine tests: ordered and independent interpretation performed.  Risk Prescription drug management.    Final diagnoses:  None    ED Discharge Orders     None          Patt Alm Macho, MD 01/04/24 2315

## 2024-01-04 NOTE — ED Triage Notes (Signed)
 C/o nasuea/vomiting all day.Admitted for same last week and dc'ed on Thursday. Unsure of cause, concern it was wegovy or CHS. Vomit is yellow in color.

## 2024-01-04 NOTE — Discharge Instructions (Signed)
 Please stay hydrated  Take Ativan as needed for nausea  I have prescribed potassium 20 mEq daily for 10 days  Stay hydrated  See your doctor for follow-up  Return to ER if you have severe abdominal pain or vomiting or dehydration

## 2024-01-04 NOTE — ED Notes (Signed)
 Date and time results received: 01/04/24 2000 (use smartphrase .now to insert current time)  Test: Potassium  Critical Value: 2.9  Name of Provider Notified: Patt  Orders Received? Or Actions Taken?: MAR

## 2024-01-07 ENCOUNTER — Ambulatory Visit: Admitting: Family Medicine

## 2024-01-07 ENCOUNTER — Encounter: Payer: Self-pay | Admitting: Family Medicine

## 2024-01-07 DIAGNOSIS — Z23 Encounter for immunization: Secondary | ICD-10-CM

## 2024-01-07 DIAGNOSIS — E876 Hypokalemia: Secondary | ICD-10-CM

## 2024-01-07 DIAGNOSIS — R112 Nausea with vomiting, unspecified: Secondary | ICD-10-CM

## 2024-01-07 NOTE — Patient Instructions (Addendum)
 A few things to remember from today's visit:  Need for immunization against influenza - Plan: Flu vaccine trivalent PF, 6mos and older(Flulaval,Afluria,Fluarix,Fluzone)  Hypokalemia - Plan: Basic metabolic panel with GFR, Phosphorus  Hypomagnesemia - Plan: Magnesium   Nausea and vomiting in adult - Plan: Basic metabolic panel with GFR, Magnesium , Phosphorus Continue adequate hydration, you can add Gatorade or Pedialyte to water  and sip through the day. Continue avoiding cannabis use.  Do not use My Chart to request refills or for acute issues that need immediate attention. If you send a my chart message, it may take a few days to be addressed, specially if I am not in the office.  Please be sure medication list is accurate. If a new problem present, please set up appointment sooner than planned today.

## 2024-01-07 NOTE — Progress Notes (Signed)
 "  Chief Complaint  Patient presents with   Hospitalization Follow-up   Discussed the use of AI scribe software for clinical note transcription with the patient, who gave verbal consent to proceed.  History of Present Illness Angela Dixon is a 38 year old female with past medical history significant for GERD, IBS, ADHD, GAD, and depression who presents for follow-up after recent hospitalization for nausea, vomiting, and electrolyte imbalance.  She was hospitalized from 12/28/23 to 01/01/24 due to nausea, vomiting, and abnormal electrolytes. During her hospitalization, she received hydration and potassium supplementation.  Back to the ED on 01/04/24 for persistent vomiting. Potasium and Mg++ abnormal, discharged with Rx of KLOR and Ativan .  Her nausea and vomiting have resolved since Sunday, with no further episodes.  She has a history of cannabis use, which was considered a potential factor in her symptoms. She states that she has reduced her cannabis use.  She had been taking Wegovy, with her last dose on the 12th of the previous month. She is currently not taking Wegovy, as the hospital advised her to wait at least two weeks before resuming it.  She experiences diarrhea, which she describes as normal for her, with three to four stools daily. No fever and her appetite is fine.  She is currently taking potassium supplementation, 20 meq once daily, and was given ten tablets.  She is not taking magnesium  at the moment. MG++ was 1.4 on 01/04/24.  She has been drinking lots of water  but not specifically with electrolytes.  During her hospitalization, her urine showed signs of dehydration with the presence of ketones.  Lab Results  Component Value Date   NA 135 01/04/2024   CL 94 (L) 01/04/2024   K 2.6 (LL) 01/04/2024   CO2 24 01/04/2024   BUN 5 (L) 01/04/2024   CREATININE 0.80 01/04/2024   GFRNONAA >60 01/04/2024   CALCIUM 10.0 01/04/2024   PHOS 2.1 (L) 01/01/2024   ALBUMIN 4.7  01/04/2024   GLUCOSE 95 01/04/2024   Lab Results  Component Value Date   WBC 9.4 01/04/2024   HGB 15.3 (H) 01/04/2024   HCT 46.4 (H) 01/04/2024   MCV 87.4 01/04/2024   PLT 267 01/04/2024   Review of Systems  Constitutional:  Negative for activity change, appetite change, chills and fever.  HENT:  Negative for sore throat.   Respiratory:  Negative for cough and wheezing.   Cardiovascular:  Negative for chest pain, palpitations and leg swelling.  Gastrointestinal:  Negative for abdominal pain and blood in stool.  Genitourinary:  Negative for decreased urine volume, dysuria and hematuria.  Skin:  Negative for rash.  Neurological:  Negative for syncope and weakness.  See other pertinent positives and negatives in HPI.  Current Outpatient Medications on File Prior to Visit  Medication Sig Dispense Refill   acetaminophen  (TYLENOL ) 500 MG tablet Take 1,000 mg by mouth every 6 (six) hours as needed for moderate pain (pain score 4-6) or mild pain (pain score 1-3).     albuterol  (VENTOLIN  HFA) 108 (90 Base) MCG/ACT inhaler Inhale 2 puffs into the lungs every 4 (four) hours as needed for wheezing or shortness of breath. 3 each 0   amphetamine-dextroamphetamine (ADDERALL XR) 30 MG 24 hr capsule Take 30 mg by mouth in the morning.     amphetamine-dextroamphetamine (ADDERALL) 10 MG tablet Take 10 mg by mouth daily.     ARIPiprazole (ABILIFY) 5 MG tablet Take 5 mg by mouth in the morning.     busPIRone  (BUSPAR )  15 MG tablet Take 1 tablet (15 mg total) by mouth 2 (two) times daily. 60 tablet 2   calcium carbonate (TUMS - DOSED IN MG ELEMENTAL CALCIUM) 500 MG chewable tablet Chew 1 tablet by mouth as needed for indigestion or heartburn.     cetirizine (ZYRTEC) 10 MG tablet Take 10 mg by mouth in the morning.     FLUoxetine  (PROZAC ) 40 MG capsule Take 40 mg by mouth in the morning.     folic acid (FOLVITE) 1 MG tablet Take 1 mg by mouth in the morning.     levonorgestrel (MIRENA, 52 MG,) 20 MCG/DAY  IUD 1 each by Intrauterine route once.     LORazepam  (ATIVAN ) 1 MG tablet Take 1 tablet (1 mg total) by mouth every 6 (six) hours as needed for anxiety. 15 tablet 0   pantoprazole  (PROTONIX ) 40 MG tablet TAKE 1 TABLET (40 MG TOTAL) BY MOUTH TWICE A DAY BEFORE MEALS (Patient taking differently: Take 40 mg by mouth in the morning.) 60 tablet 2   potassium chloride  SA (KLOR-CON  M) 20 MEQ tablet Take 1 tablet (20 mEq total) by mouth daily. 10 tablet 0   Pseudoephedrine-Ibuprofen  (ADVIL  COLD & SINUS LIQUI-GELS PO) Take 2 capsules by mouth as needed.     [Paused] WEGOVY 1 MG/0.5ML SOAJ SQ injection Inject 1 mg into the skin once a week. (Patient not taking: Reported on 01/07/2024)     No current facility-administered medications on file prior to visit.    Past Medical History:  Diagnosis Date   Abnormal Pap smear    Acid reflux    ADHD    Allergy    Anemia    Anxiety    Cannabinoid hyperemesis syndrome 02/28/2019   Chlamydia 2006   Depression    Fracture    Hypercholesterolemia    IBS (irritable bowel syndrome)    Vitamin D  deficiency    Allergies  Allergen Reactions   Wellbutrin  [Bupropion ] Hives   Zoloft  [Sertraline ] Other (See Comments)    Excessive sweating, insomnia   Neomycin Rash    Social History   Socioeconomic History   Marital status: Divorced    Spouse name: Not on file   Number of children: 2   Years of education: Not on file   Highest education level: Master's degree (e.g., MA, MS, MEng, MEd, MSW, MBA)  Occupational History   Not on file  Tobacco Use   Smoking status: Former    Current packs/day: 0.00    Average packs/day: 1 pack/day for 3.0 years (3.0 ttl pk-yrs)    Types: Cigarettes    Quit date: 07/09/2006    Years since quitting: 17.5   Smokeless tobacco: Never  Vaping Use   Vaping status: Never Used  Substance and Sexual Activity   Alcohol use: Yes    Alcohol/week: 3.0 - 4.0 standard drinks of alcohol    Types: 3 - 4 Glasses of wine per week   Drug  use: Not Currently    Types: Marijuana    Comment: last 05/2019   Sexual activity: Not Currently    Partners: Male    Birth control/protection: Condom, I.U.D.    Comment: condoms intermittent, lifetime partners 87  Other Topics Concern   Not on file  Social History Narrative   Not on file   Social Drivers of Health   Financial Resource Strain: Low Risk  (10/21/2023)   Overall Financial Resource Strain (CARDIA)    Difficulty of Paying Living Expenses: Not hard at all  Food Insecurity: No Food Insecurity (12/28/2023)   Hunger Vital Sign    Worried About Running Out of Food in the Last Year: Never true    Ran Out of Food in the Last Year: Never true  Transportation Needs: No Transportation Needs (12/28/2023)   PRAPARE - Administrator, Civil Service (Medical): No    Lack of Transportation (Non-Medical): No  Physical Activity: Insufficiently Active (10/21/2023)   Exercise Vital Sign    Days of Exercise per Week: 4 days    Minutes of Exercise per Session: 20 min  Stress: Stress Concern Present (10/21/2023)   [employer] of Occupational Health - Occupational Stress Questionnaire    Feeling of Stress: To some extent  Social Connections: Socially Isolated (10/21/2023)   Social Connection and Isolation Panel    Frequency of Communication with Friends and Family: Twice a week    Frequency of Social Gatherings with Friends and Family: More than three times a week    Attends Religious Services: Never    Database Administrator or Organizations: No    Attends Engineer, Structural: Not on file    Marital Status: Divorced    Today's Vitals   01/07/24 0807  BP: 120/80  Pulse: 100  Resp: 16  Temp: 97.9 F (36.6 C)  SpO2: 99%  Weight: 178 lb 9.6 oz (81 kg)  Height: 5' 4 (1.626 m)   Body mass index is 30.66 kg/m.  Physical Exam Vitals and nursing note reviewed.  Constitutional:      General: She is not in acute distress.    Appearance: She is  well-developed.  HENT:     Head: Normocephalic and atraumatic.     Mouth/Throat:     Mouth: Mucous membranes are dry.     Pharynx: Oropharynx is clear.  Eyes:     Conjunctiva/sclera: Conjunctivae normal.  Cardiovascular:     Rate and Rhythm: Normal rate and regular rhythm.     Heart sounds: No murmur heard. Pulmonary:     Effort: Pulmonary effort is normal. No respiratory distress.     Breath sounds: Normal breath sounds.  Abdominal:     Palpations: Abdomen is soft. There is no hepatomegaly or mass.     Tenderness: There is no abdominal tenderness.  Musculoskeletal:     Right lower leg: No edema.     Left lower leg: No edema.  Lymphadenopathy:     Cervical: No cervical adenopathy.  Skin:    General: Skin is warm.     Findings: Ecchymosis (On sites of venipuncture, forearms.) present. No erythema or rash.  Neurological:     General: No focal deficit present.     Mental Status: She is alert and oriented to person, place, and time.     Cranial Nerves: No cranial nerve deficit.     Gait: Gait normal.  Psychiatric:        Mood and Affect: Mood and affect normal.   ASSESSMENT AND PLAN:  Angela Dixon was seen today for hospitalization follow-up.  Diagnoses and all orders for this visit:  Orders Placed This Encounter  Procedures   Flu vaccine trivalent PF, 6mos and older(Flulaval,Afluria,Fluarix,Fluzone)   Basic metabolic panel with GFR   Magnesium    Phosphorus   Lab Results  Component Value Date   NA 138 01/07/2024   CL 98 01/07/2024   K 3.5 01/07/2024   CO2 23 01/07/2024   BUN 4 (L) 01/07/2024   CREATININE 0.82 01/07/2024  EGFR 93 01/07/2024   CALCIUM 9.5 01/07/2024   PHOS 2.7 (L) 01/07/2024   ALBUMIN 4.7 01/04/2024   GLUCOSE 66 (L) 01/07/2024   Hypomagnesemia Currently she is not on magnesium  supplementation. Further recommendation will be given according to lab results.  -     Magnesium ; Future  Hypokalemia Continue K-Lor 20 mK daily. Further  recommendation will be given according to lab results, we may need to repeat potassium after she stops potassium supplementation.  -     Basic metabolic panel with GFR; Future -     Phosphorus; Future  Nausea and vomiting in adult Have resolved. We reviewed possible etiologies, she has had similar symptoms in the past after cannabis use.  She has discontinue Wegovy, last dose 12/19/2023, planning on resuming it.If problem reoccurs after resuming med, she will need to consider stopping medication. Follow-up with PCP as needed. -     Basic metabolic panel with GFR; Future -     Magnesium ; Future -     Phosphorus; Future  Need for immunization against influenza -     Flu vaccine trivalent PF, 6mos and older(Flulaval,Afluria,Fluarix,Fluzone)  Return if symptoms worsen or fail to improve.  Angela Hinckley G. Etai Copado, MD  [facility]. [place] [facility]. "

## 2024-01-08 ENCOUNTER — Ambulatory Visit: Payer: Self-pay | Admitting: Family Medicine

## 2024-01-08 DIAGNOSIS — E876 Hypokalemia: Secondary | ICD-10-CM

## 2024-01-08 LAB — BASIC METABOLIC PANEL WITH GFR
BUN/Creatinine Ratio: 5 — ABNORMAL LOW (ref 9–23)
BUN: 4 mg/dL — ABNORMAL LOW (ref 6–20)
CO2: 23 mmol/L (ref 20–29)
Calcium: 9.5 mg/dL (ref 8.7–10.2)
Chloride: 98 mmol/L (ref 96–106)
Creatinine, Ser: 0.82 mg/dL (ref 0.57–1.00)
Glucose: 66 mg/dL — ABNORMAL LOW (ref 70–99)
Potassium: 3.5 mmol/L (ref 3.5–5.2)
Sodium: 138 mmol/L (ref 134–144)
eGFR: 93 mL/min/1.73 (ref >59)

## 2024-01-08 LAB — PHOSPHORUS: Phosphorus: 2.7 mg/dL — ABNORMAL LOW (ref 3.0–4.3)

## 2024-01-08 LAB — MAGNESIUM: Magnesium: 1.7 mg/dL (ref 1.6–2.3)

## 2024-01-12 ENCOUNTER — Encounter: Payer: Self-pay | Admitting: Diagnostic Neuroimaging

## 2024-01-14 ENCOUNTER — Ambulatory Visit (INDEPENDENT_AMBULATORY_CARE_PROVIDER_SITE_OTHER): Admitting: *Deleted

## 2024-01-14 DIAGNOSIS — E669 Obesity, unspecified: Secondary | ICD-10-CM | POA: Diagnosis not present

## 2024-01-14 DIAGNOSIS — E538 Deficiency of other specified B group vitamins: Secondary | ICD-10-CM

## 2024-01-14 MED ORDER — CYANOCOBALAMIN 1000 MCG/ML IJ SOLN
1000.0000 ug | Freq: Once | INTRAMUSCULAR | Status: AC
  Administered 2024-01-14: 1000 ug via INTRAMUSCULAR

## 2024-01-14 NOTE — Progress Notes (Signed)
 Per orders of  Philippe, NP, injection of Cyanocobalamin  1000mcg given by Christyne Idell LABOR. Patient tolerated injection well.

## 2024-01-19 ENCOUNTER — Ambulatory Visit
Admission: RE | Admit: 2024-01-19 | Discharge: 2024-01-19 | Disposition: A | Discharge status: Home / Self Care | Source: Ambulatory Visit | Attending: Diagnostic Neuroimaging | Admitting: Diagnostic Neuroimaging

## 2024-01-19 DIAGNOSIS — R200 Anesthesia of skin: Secondary | ICD-10-CM | POA: Diagnosis not present

## 2024-01-19 DIAGNOSIS — M62838 Other muscle spasm: Secondary | ICD-10-CM

## 2024-01-19 MED ORDER — GADOPICLENOL 0.5 MMOL/ML IV SOLN
7.5000 mL | Freq: Once | INTRAVENOUS | Status: AC | PRN
  Administered 2024-01-19: 7.5 mL via INTRAVENOUS

## 2024-01-20 DIAGNOSIS — F411 Generalized anxiety disorder: Secondary | ICD-10-CM | POA: Diagnosis not present

## 2024-01-20 DIAGNOSIS — F331 Major depressive disorder, recurrent, moderate: Secondary | ICD-10-CM | POA: Diagnosis not present

## 2024-01-20 DIAGNOSIS — F902 Attention-deficit hyperactivity disorder, combined type: Secondary | ICD-10-CM | POA: Diagnosis not present

## 2024-01-20 DIAGNOSIS — F319 Bipolar disorder, unspecified: Secondary | ICD-10-CM | POA: Diagnosis not present

## 2024-01-23 ENCOUNTER — Ambulatory Visit: Payer: Self-pay | Admitting: Diagnostic Neuroimaging

## 2024-01-30 ENCOUNTER — Other Ambulatory Visit (INDEPENDENT_AMBULATORY_CARE_PROVIDER_SITE_OTHER)

## 2024-01-30 DIAGNOSIS — E538 Deficiency of other specified B group vitamins: Secondary | ICD-10-CM

## 2024-01-30 DIAGNOSIS — E876 Hypokalemia: Secondary | ICD-10-CM

## 2024-01-30 LAB — VITAMIN B12: Vitamin B-12: 226 pg/mL (ref 211–911)

## 2024-01-30 LAB — POTASSIUM: Potassium: 2.9 meq/L — ABNORMAL LOW (ref 3.5–5.1)

## 2024-02-02 ENCOUNTER — Ambulatory Visit: Payer: Self-pay | Admitting: Family Medicine

## 2024-02-02 MED ORDER — POTASSIUM CHLORIDE CRYS ER 20 MEQ PO TBCR: EXTENDED_RELEASE_TABLET | ORAL | 0 refills | Status: DC

## 2024-02-03 DIAGNOSIS — Z01411 Encounter for gynecological examination (general) (routine) with abnormal findings: Secondary | ICD-10-CM | POA: Diagnosis not present

## 2024-02-03 DIAGNOSIS — Z124 Encounter for screening for malignant neoplasm of cervix: Secondary | ICD-10-CM | POA: Diagnosis not present

## 2024-02-03 DIAGNOSIS — Z113 Encounter for screening for infections with a predominantly sexual mode of transmission: Secondary | ICD-10-CM | POA: Diagnosis not present

## 2024-02-03 DIAGNOSIS — Z1331 Encounter for screening for depression: Secondary | ICD-10-CM | POA: Diagnosis not present

## 2024-02-03 DIAGNOSIS — Z01419 Encounter for gynecological examination (general) (routine) without abnormal findings: Secondary | ICD-10-CM | POA: Diagnosis not present

## 2024-02-09 ENCOUNTER — Encounter: Payer: Self-pay | Admitting: Radiology

## 2024-02-13 ENCOUNTER — Ambulatory Visit

## 2024-02-14 DIAGNOSIS — E669 Obesity, unspecified: Secondary | ICD-10-CM | POA: Diagnosis not present

## 2024-02-16 ENCOUNTER — Ambulatory Visit (INDEPENDENT_AMBULATORY_CARE_PROVIDER_SITE_OTHER)

## 2024-02-16 DIAGNOSIS — E538 Deficiency of other specified B group vitamins: Secondary | ICD-10-CM

## 2024-02-16 MED ORDER — CYANOCOBALAMIN 1000 MCG/ML IJ SOLN
1000.0000 ug | Freq: Once | INTRAMUSCULAR | Status: AC
  Administered 2024-02-16: 1000 ug via INTRAMUSCULAR

## 2024-02-16 NOTE — Progress Notes (Signed)
 Patient is in office today for a nurse visit for B12 Injection. Patient Injection was given in the  Left deltoid. Patient tolerated injection well.

## 2024-02-17 DIAGNOSIS — F319 Bipolar disorder, unspecified: Secondary | ICD-10-CM | POA: Diagnosis not present

## 2024-02-17 DIAGNOSIS — F411 Generalized anxiety disorder: Secondary | ICD-10-CM | POA: Diagnosis not present

## 2024-02-17 DIAGNOSIS — F902 Attention-deficit hyperactivity disorder, combined type: Secondary | ICD-10-CM | POA: Diagnosis not present

## 2024-02-17 DIAGNOSIS — F331 Major depressive disorder, recurrent, moderate: Secondary | ICD-10-CM | POA: Diagnosis not present

## 2024-02-19 ENCOUNTER — Encounter: Payer: 59 | Admitting: Nurse Practitioner

## 2024-02-23 ENCOUNTER — Emergency Department (HOSPITAL_COMMUNITY): Admission: EM | Admit: 2024-02-23 | Discharge: 2024-02-23 | Disposition: A | Discharge status: Home / Self Care

## 2024-02-23 ENCOUNTER — Other Ambulatory Visit: Payer: Self-pay

## 2024-02-23 DIAGNOSIS — F1290 Cannabis use, unspecified, uncomplicated: Secondary | ICD-10-CM | POA: Insufficient documentation

## 2024-02-23 DIAGNOSIS — R1110 Vomiting, unspecified: Secondary | ICD-10-CM | POA: Diagnosis present

## 2024-02-23 DIAGNOSIS — F1299 Cannabis use, unspecified with unspecified cannabis-induced disorder: Secondary | ICD-10-CM | POA: Diagnosis not present

## 2024-02-23 DIAGNOSIS — R9431 Abnormal electrocardiogram [ECG] [EKG]: Secondary | ICD-10-CM | POA: Diagnosis not present

## 2024-02-23 DIAGNOSIS — F12188 Cannabis abuse with other cannabis-induced disorder: Secondary | ICD-10-CM | POA: Diagnosis not present

## 2024-02-23 DIAGNOSIS — R112 Nausea with vomiting, unspecified: Secondary | ICD-10-CM | POA: Diagnosis not present

## 2024-02-23 DIAGNOSIS — R1116 Cannabis hyperemesis syndrome: Secondary | ICD-10-CM | POA: Diagnosis not present

## 2024-02-23 DIAGNOSIS — F12988 Cannabis use, unspecified with other cannabis-induced disorder: Secondary | ICD-10-CM | POA: Diagnosis not present

## 2024-02-23 LAB — URINALYSIS, ROUTINE W REFLEX MICROSCOPIC
Glucose, UA: NEGATIVE mg/dL
Hgb urine dipstick: NEGATIVE
Ketones, ur: 20 mg/dL — AB
Leukocytes,Ua: NEGATIVE
Nitrite: NEGATIVE
Protein, ur: >=300 mg/dL — AB
Specific Gravity, Urine: 1.035 — ABNORMAL HIGH (ref 1.005–1.030)
pH: 5 (ref 5.0–8.0)

## 2024-02-23 LAB — COMPREHENSIVE METABOLIC PANEL WITH GFR
ALT: 14 U/L (ref 0–44)
AST: 29 U/L (ref 15–41)
Albumin: 5.4 g/dL — ABNORMAL HIGH (ref 3.5–5.0)
Alkaline Phosphatase: 67 U/L (ref 38–126)
Anion gap: 20 — ABNORMAL HIGH (ref 5–15)
BUN: 12 mg/dL (ref 6–20)
CO2: 23 mmol/L (ref 22–32)
Calcium: 10.6 mg/dL — ABNORMAL HIGH (ref 8.9–10.3)
Chloride: 98 mmol/L (ref 98–111)
Creatinine, Ser: 0.89 mg/dL (ref 0.44–1.00)
GFR, Estimated: >60 mL/min (ref >60)
Glucose, Bld: 157 mg/dL — ABNORMAL HIGH (ref 70–99)
Potassium: 3.5 mmol/L (ref 3.5–5.1)
Sodium: 141 mmol/L (ref 135–145)
Total Bilirubin: 0.6 mg/dL (ref 0.0–1.2)
Total Protein: 8.8 g/dL — ABNORMAL HIGH (ref 6.5–8.1)

## 2024-02-23 LAB — CBC
HCT: 43.5 % (ref 36.0–46.0)
Hemoglobin: 15.1 g/dL — ABNORMAL HIGH (ref 12.0–15.0)
MCH: 30.1 pg (ref 26.0–34.0)
MCHC: 34.7 g/dL (ref 30.0–36.0)
MCV: 86.7 fL (ref 80.0–100.0)
Platelets: 372 K/uL (ref 150–400)
RBC: 5.02 MIL/uL (ref 3.87–5.11)
RDW: 13 % (ref 11.5–15.5)
WBC: 16.2 K/uL — ABNORMAL HIGH (ref 4.0–10.5)
nRBC: 0 % (ref 0.0–0.2)

## 2024-02-23 LAB — HCG, SERUM, QUALITATIVE: Preg, Serum: NEGATIVE

## 2024-02-23 LAB — LIPASE, BLOOD: Lipase: 12 U/L (ref 11–51)

## 2024-02-23 MED ORDER — ONDANSETRON HCL 4 MG PO TABS: 4.0000 mg | ORAL_TABLET | Freq: Three times a day (TID) | ORAL | 0 refills | Status: AC | PRN

## 2024-02-23 MED ORDER — LACTATED RINGERS IV BOLUS
1000.0000 mL | Freq: Once | INTRAVENOUS | Status: AC
  Administered 2024-02-23: 1000 mL via INTRAVENOUS

## 2024-02-23 MED ORDER — ONDANSETRON HCL 4 MG/2ML IJ SOLN
4.0000 mg | Freq: Once | INTRAMUSCULAR | Status: AC
  Administered 2024-02-23: 4 mg via INTRAVENOUS
  Filled 2024-02-23: qty 2

## 2024-02-23 NOTE — ED Triage Notes (Addendum)
 Patient c/o generalize abdominal discomfort and  emesis x 1 day. Patient report unable to keep anything down since last night. Patient denies dysuria. Patient denies chest pain and SOB.

## 2024-02-23 NOTE — ED Notes (Signed)
 Pt given water for PO challenge.

## 2024-02-23 NOTE — ED Provider Notes (Signed)
 [place] EMERGENCY DEPARTMENT AT [facility] Provider Note   CSN: [id number] Arrival date & time: 02/23/24  1134     Patient presents with: Emesis and Abdominal Pain   Angela Dixon is a 38 y.o. female.  {Add pertinent medical, surgical, social history, OB history to HPI:8264} 38 year old female presents for evaluation of cannabis hyperemesis.  Says she has a history of this and states that she has used marijuana since.  States her symptoms started last night she has been unable to keep anything down today.  Denies any other symptoms or concerns.   Emesis Associated symptoms: abdominal pain   Associated symptoms: no arthralgias, no chills, no cough, no fever and no sore throat   Abdominal Pain Associated symptoms: vomiting   Associated symptoms: no chest pain, no chills, no cough, no dysuria, no fever, no hematuria, no shortness of breath and no sore throat        Prior to Admission medications   Medication Sig Start Date End Date Taking? Authorizing Provider  acetaminophen  (TYLENOL ) 500 MG tablet Take 1,000 mg by mouth every 6 (six) hours as needed for moderate pain (pain score 4-6) or mild pain (pain score 1-3).    [provider]  albuterol  (VENTOLIN  HFA) 108 (90 Base) MCG/ACT inhaler Inhale 2 puffs into the lungs every 4 (four) hours as needed for wheezing or shortness of breath. 04/05/22   Wilkinson, Dana E, NP  amphetamine-dextroamphetamine (ADDERALL XR) 30 MG 24 hr capsule Take 30 mg by mouth in the morning.    [provider]  amphetamine-dextroamphetamine (ADDERALL) 10 MG tablet Take 10 mg by mouth daily.    [provider]  ARIPiprazole (ABILIFY) 5 MG tablet Take 5 mg by mouth in the morning.    [provider]  busPIRone  (BUSPAR ) 15 MG tablet Take 1 tablet (15 mg total) by mouth 2 (two) times daily. 03/10/23   Laurice President, NP  calcium carbonate (TUMS - DOSED IN MG ELEMENTAL CALCIUM) 500 MG chewable tablet Chew 1 tablet  by mouth as needed for indigestion or heartburn.    [provider]  cetirizine (ZYRTEC) 10 MG tablet Take 10 mg by mouth in the morning.    [provider]  FLUoxetine  (PROZAC ) 40 MG capsule Take 40 mg by mouth in the morning. 12/09/23   [provider]  folic acid (FOLVITE) 1 MG tablet Take 1 mg by mouth in the morning. 02/14/22   [provider]  levonorgestrel (MIRENA, 52 MG,) 20 MCG/DAY IUD 1 each by Intrauterine route once. 12/23/22   [provider]  LORazepam (ATIVAN) 1 MG tablet Take 1 tablet (1 mg total) by mouth every 6 (six) hours as needed for anxiety. 01/04/24   Patt Alm Macho, MD  pantoprazole  (PROTONIX ) 40 MG tablet TAKE 1 TABLET (40 MG TOTAL) BY MOUTH TWICE A DAY BEFORE MEALS Patient taking differently: Take 40 mg by mouth in the morning. 11/27/23   Craig Alan SAUNDERS, PA-C  potassium chloride  SA (KLOR-CON  M) 20 MEQ tablet 1 tab bid x 5 days then continue 1 tab daily. Appt with pcp 02/13/24. 02/02/24   Jordan, Betty G, MD  Pseudoephedrine-Ibuprofen  (ADVIL  COLD & SINUS LIQUI-GELS PO) Take 2 capsules by mouth as needed.    [provider]  WEGOVY 1 MG/0.5ML SOAJ SQ injection Inject 1 mg into the skin once a week. Patient not taking: Reported on 01/07/2024 12/11/23   [provider]    Allergies: Wellbutrin  [bupropion ], Zoloft  [sertraline ], and Neomycin  Review of Systems  Constitutional:  Negative for chills and fever.  HENT:  Negative for ear pain and sore throat.   Eyes:  Negative for pain and visual disturbance.  Respiratory:  Negative for cough and shortness of breath.   Cardiovascular:  Negative for chest pain and palpitations.  Gastrointestinal:  Positive for abdominal pain and vomiting.  Genitourinary:  Negative for dysuria and hematuria.  Musculoskeletal:  Negative for arthralgias and back pain.  Skin:  Negative for color change and rash.  Neurological:  Negative for seizures and syncope.  All other systems  reviewed and are negative.   Updated Vital Signs BP (!) 146/101 (BP Location: Left Arm)   Pulse 93   Temp 98.4 F (36.9 C) (Oral)   Resp 16   SpO2 99%   Physical Exam Vitals and nursing note reviewed.  Constitutional:      General: She is not in acute distress.    Appearance: She is well-developed. She is not ill-appearing.  HENT:     Head: Normocephalic and atraumatic.  Eyes:     Conjunctiva/sclera: Conjunctivae normal.  Cardiovascular:     Rate and Rhythm: Normal rate and regular rhythm.     Heart sounds: No murmur heard. Pulmonary:     Effort: Pulmonary effort is normal. No respiratory distress.     Breath sounds: Normal breath sounds.  Abdominal:     Palpations: Abdomen is soft.     Tenderness: There is no abdominal tenderness.  Musculoskeletal:        General: No swelling.     Cervical back: Neck supple.  Skin:    General: Skin is warm and dry.     Capillary Refill: Capillary refill takes less than 2 seconds.  Neurological:     Mental Status: She is alert.  Psychiatric:        Mood and Affect: Mood normal.     (all labs ordered are listed, but only abnormal results are displayed) Labs Reviewed  COMPREHENSIVE METABOLIC PANEL WITH GFR - Abnormal; Notable for the following components:      Result Value   Glucose, Bld 157 (*)    Calcium 10.6 (*)    Total Protein 8.8 (*)    Albumin 5.4 (*)    Anion gap 20 (*)    All other components within normal limits  CBC - Abnormal; Notable for the following components:   WBC 16.2 (*)    Hemoglobin 15.1 (*)    All other components within normal limits  URINALYSIS, ROUTINE W REFLEX MICROSCOPIC - Abnormal; Notable for the following components:   Color, Urine AMBER (*)    APPearance TURBID (*)    Specific Gravity, Urine 1.035 (*)    Bilirubin Urine SMALL (*)    Ketones, ur 20 (*)    Protein, ur >=300 (*)    Bacteria, UA MANY (*)    All other components within normal limits  LIPASE, BLOOD  HCG, SERUM, QUALITATIVE     EKG: None  Radiology: No results found.  {Document cardiac monitor, telemetry assessment procedure when appropriate:32947} Procedures   Medications Ordered in the ED  lactated ringers  bolus 1,000 mL (has no administration in time range)      {Click here for ABCD2, HEART and other calculators REFRESH Note before signing:1}                              Medical Decision Making Amount and/or Complexity of Data  Reviewed Labs: ordered.   ***  {Document critical care time when appropriate  Document review of labs and clinical decision tools ie CHADS2VASC2, etc  Document your independent review of radiology images and any outside records  Document your discussion with family members, caretakers and with consultants  Document social determinants of health affecting pt's care  Document your decision making why or why not admission, treatments were needed:32947:::1}   Final diagnoses:  None    ED Discharge Orders     None

## 2024-02-23 NOTE — Discharge Instructions (Signed)
 Take your Zofran  as needed for nausea and vomiting. Drink lots of fluids. Keep a bland diet for the next 24 hours.

## 2024-02-24 ENCOUNTER — Encounter: Payer: 59 | Admitting: Nurse Practitioner

## 2024-03-15 DIAGNOSIS — E669 Obesity, unspecified: Secondary | ICD-10-CM | POA: Diagnosis not present

## 2024-03-16 DIAGNOSIS — F902 Attention-deficit hyperactivity disorder, combined type: Secondary | ICD-10-CM | POA: Diagnosis not present

## 2024-03-16 DIAGNOSIS — F411 Generalized anxiety disorder: Secondary | ICD-10-CM | POA: Diagnosis not present

## 2024-03-16 DIAGNOSIS — F319 Bipolar disorder, unspecified: Secondary | ICD-10-CM | POA: Diagnosis not present

## 2024-03-16 DIAGNOSIS — F331 Major depressive disorder, recurrent, moderate: Secondary | ICD-10-CM | POA: Diagnosis not present

## 2024-04-25 ENCOUNTER — Other Ambulatory Visit: Payer: Self-pay

## 2024-04-25 ENCOUNTER — Encounter (HOSPITAL_BASED_OUTPATIENT_CLINIC_OR_DEPARTMENT_OTHER): Payer: Self-pay | Admitting: Emergency Medicine

## 2024-04-25 ENCOUNTER — Inpatient Hospital Stay (HOSPITAL_BASED_OUTPATIENT_CLINIC_OR_DEPARTMENT_OTHER)
Admission: EM | Admit: 2024-04-25 | Discharge: 2024-05-01 | DRG: 641 | Disposition: A | Discharge status: Home / Self Care | Attending: Family Medicine | Admitting: Family Medicine

## 2024-04-25 DIAGNOSIS — I4721 Torsades de pointes: Secondary | ICD-10-CM | POA: Diagnosis not present

## 2024-04-25 DIAGNOSIS — E7800 Pure hypercholesterolemia, unspecified: Secondary | ICD-10-CM | POA: Diagnosis present

## 2024-04-25 DIAGNOSIS — F1290 Cannabis use, unspecified, uncomplicated: Secondary | ICD-10-CM | POA: Diagnosis not present

## 2024-04-25 DIAGNOSIS — J45909 Unspecified asthma, uncomplicated: Secondary | ICD-10-CM | POA: Diagnosis present

## 2024-04-25 DIAGNOSIS — I493 Ventricular premature depolarization: Secondary | ICD-10-CM | POA: Diagnosis not present

## 2024-04-25 DIAGNOSIS — Z79899 Other long term (current) drug therapy: Secondary | ICD-10-CM

## 2024-04-25 DIAGNOSIS — R9431 Abnormal electrocardiogram [ECG] [EKG]: Secondary | ICD-10-CM | POA: Diagnosis not present

## 2024-04-25 DIAGNOSIS — E871 Hypo-osmolality and hyponatremia: Secondary | ICD-10-CM | POA: Diagnosis present

## 2024-04-25 DIAGNOSIS — F411 Generalized anxiety disorder: Secondary | ICD-10-CM | POA: Diagnosis present

## 2024-04-25 DIAGNOSIS — Z888 Allergy status to other drugs, medicaments and biological substances status: Secondary | ICD-10-CM

## 2024-04-25 DIAGNOSIS — R1115 Cyclical vomiting syndrome unrelated to migraine: Secondary | ICD-10-CM | POA: Diagnosis not present

## 2024-04-25 DIAGNOSIS — Z833 Family history of diabetes mellitus: Secondary | ICD-10-CM

## 2024-04-25 DIAGNOSIS — F32A Depression, unspecified: Secondary | ICD-10-CM | POA: Diagnosis present

## 2024-04-25 DIAGNOSIS — K589 Irritable bowel syndrome without diarrhea: Secondary | ICD-10-CM | POA: Diagnosis present

## 2024-04-25 DIAGNOSIS — Z6832 Body mass index (BMI) 32.0-32.9, adult: Secondary | ICD-10-CM

## 2024-04-25 DIAGNOSIS — Z20828 Contact with and (suspected) exposure to other viral communicable diseases: Secondary | ICD-10-CM | POA: Diagnosis present

## 2024-04-25 DIAGNOSIS — Z825 Family history of asthma and other chronic lower respiratory diseases: Secondary | ICD-10-CM

## 2024-04-25 DIAGNOSIS — Z83438 Family history of other disorder of lipoprotein metabolism and other lipidemia: Secondary | ICD-10-CM

## 2024-04-25 DIAGNOSIS — E66811 Obesity, class 1: Secondary | ICD-10-CM | POA: Diagnosis present

## 2024-04-25 DIAGNOSIS — E860 Dehydration: Secondary | ICD-10-CM | POA: Diagnosis present

## 2024-04-25 DIAGNOSIS — K219 Gastro-esophageal reflux disease without esophagitis: Secondary | ICD-10-CM | POA: Diagnosis present

## 2024-04-25 DIAGNOSIS — F909 Attention-deficit hyperactivity disorder, unspecified type: Secondary | ICD-10-CM | POA: Diagnosis present

## 2024-04-25 DIAGNOSIS — R112 Nausea with vomiting, unspecified: Secondary | ICD-10-CM | POA: Diagnosis not present

## 2024-04-25 DIAGNOSIS — E876 Hypokalemia: Principal | ICD-10-CM | POA: Diagnosis present

## 2024-04-25 DIAGNOSIS — T50995A Adverse effect of other drugs, medicaments and biological substances, initial encounter: Secondary | ICD-10-CM | POA: Diagnosis present

## 2024-04-25 DIAGNOSIS — Z87891 Personal history of nicotine dependence: Secondary | ICD-10-CM

## 2024-04-25 DIAGNOSIS — Z809 Family history of malignant neoplasm, unspecified: Secondary | ICD-10-CM

## 2024-04-25 DIAGNOSIS — Z801 Family history of malignant neoplasm of trachea, bronchus and lung: Secondary | ICD-10-CM

## 2024-04-25 DIAGNOSIS — R1116 Cannabis hyperemesis syndrome: Secondary | ICD-10-CM | POA: Diagnosis present

## 2024-04-25 DIAGNOSIS — D72829 Elevated white blood cell count, unspecified: Secondary | ICD-10-CM | POA: Diagnosis present

## 2024-04-25 DIAGNOSIS — Z7985 Long-term (current) use of injectable non-insulin antidiabetic drugs: Secondary | ICD-10-CM

## 2024-04-25 DIAGNOSIS — F1010 Alcohol abuse, uncomplicated: Secondary | ICD-10-CM | POA: Diagnosis present

## 2024-04-25 DIAGNOSIS — Z811 Family history of alcohol abuse and dependence: Secondary | ICD-10-CM

## 2024-04-25 DIAGNOSIS — Z8249 Family history of ischemic heart disease and other diseases of the circulatory system: Secondary | ICD-10-CM

## 2024-04-25 DIAGNOSIS — Z8379 Family history of other diseases of the digestive system: Secondary | ICD-10-CM

## 2024-04-25 DIAGNOSIS — Z1152 Encounter for screening for COVID-19: Secondary | ICD-10-CM

## 2024-04-25 DIAGNOSIS — F1990 Other psychoactive substance use, unspecified, uncomplicated: Secondary | ICD-10-CM

## 2024-04-25 LAB — BASIC METABOLIC PANEL WITH GFR
Anion gap: 15 (ref 5–15)
BUN: 8 mg/dL (ref 6–20)
CO2: 25 mmol/L (ref 22–32)
Calcium: 8.7 mg/dL — ABNORMAL LOW (ref 8.9–10.3)
Chloride: 102 mmol/L (ref 98–111)
Creatinine, Ser: 0.64 mg/dL (ref 0.44–1.00)
GFR, Estimated: >60 mL/min
Glucose, Bld: 98 mg/dL (ref 70–99)
Potassium: 3.2 mmol/L — ABNORMAL LOW (ref 3.5–5.1)
Sodium: 143 mmol/L (ref 135–145)

## 2024-04-25 LAB — CBC WITH DIFFERENTIAL/PLATELET
Abs Immature Granulocytes: 0.08 K/uL — ABNORMAL HIGH (ref 0.00–0.07)
Basophils Absolute: 0 K/uL (ref 0.0–0.1)
Basophils Relative: 0 %
Eosinophils Absolute: 0 K/uL (ref 0.0–0.5)
Eosinophils Relative: 0 %
HCT: 38.2 % (ref 36.0–46.0)
Hemoglobin: 13.6 g/dL (ref 12.0–15.0)
Immature Granulocytes: 1 %
Lymphocytes Relative: 5 %
Lymphs Abs: 0.8 K/uL (ref 0.7–4.0)
MCH: 30.2 pg (ref 26.0–34.0)
MCHC: 35.6 g/dL (ref 30.0–36.0)
MCV: 84.7 fL (ref 80.0–100.0)
Monocytes Absolute: 0.5 K/uL (ref 0.1–1.0)
Monocytes Relative: 3 %
Neutro Abs: 15.4 K/uL — ABNORMAL HIGH (ref 1.7–7.7)
Neutrophils Relative %: 91 %
Platelets: 339 K/uL (ref 150–400)
RBC: 4.51 MIL/uL (ref 3.87–5.11)
RDW: 13.4 % (ref 11.5–15.5)
WBC: 16.8 K/uL — ABNORMAL HIGH (ref 4.0–10.5)
nRBC: 0 % (ref 0.0–0.2)

## 2024-04-25 LAB — COMPREHENSIVE METABOLIC PANEL WITH GFR
ALT: 15 U/L (ref 0–44)
AST: 23 U/L (ref 15–41)
Albumin: 5.1 g/dL — ABNORMAL HIGH (ref 3.5–5.0)
Alkaline Phosphatase: 65 U/L (ref 38–126)
Anion gap: 22 — ABNORMAL HIGH (ref 5–15)
BUN: 9 mg/dL (ref 6–20)
CO2: 23 mmol/L (ref 22–32)
Calcium: 10.6 mg/dL — ABNORMAL HIGH (ref 8.9–10.3)
Chloride: 98 mmol/L (ref 98–111)
Creatinine, Ser: 0.74 mg/dL (ref 0.44–1.00)
GFR, Estimated: >60 mL/min
Glucose, Bld: 137 mg/dL — ABNORMAL HIGH (ref 70–99)
Potassium: 2.9 mmol/L — ABNORMAL LOW (ref 3.5–5.1)
Sodium: 144 mmol/L (ref 135–145)
Total Bilirubin: 0.9 mg/dL (ref 0.0–1.2)
Total Protein: 8.2 g/dL — ABNORMAL HIGH (ref 6.5–8.1)

## 2024-04-25 LAB — URINALYSIS, ROUTINE W REFLEX MICROSCOPIC
Bacteria, UA: NONE SEEN
Bilirubin Urine: NEGATIVE
Glucose, UA: NEGATIVE mg/dL
Hgb urine dipstick: NEGATIVE
Ketones, ur: >80 mg/dL — AB
Leukocytes,Ua: NEGATIVE
Nitrite: NEGATIVE
Protein, ur: 100 mg/dL — AB
Specific Gravity, Urine: 1.034 — ABNORMAL HIGH (ref 1.005–1.030)
pH: 6.5 (ref 5.0–8.0)

## 2024-04-25 LAB — URINE DRUG SCREEN
Amphetamines: POSITIVE — AB
Barbiturates: NEGATIVE
Benzodiazepines: POSITIVE — AB
Cocaine: NEGATIVE
Fentanyl: NEGATIVE
Methadone Scn, Ur: NEGATIVE
Opiates: NEGATIVE
Tetrahydrocannabinol: POSITIVE — AB

## 2024-04-25 LAB — MAGNESIUM
Magnesium: 1.1 mg/dL — ABNORMAL LOW (ref 1.7–2.4)
Magnesium: 2.2 mg/dL (ref 1.7–2.4)

## 2024-04-25 LAB — TSH: TSH: 1.25 u[IU]/mL (ref 0.350–4.500)

## 2024-04-25 LAB — ETHANOL: Alcohol, Ethyl (B): <15 mg/dL

## 2024-04-25 LAB — RESP PANEL BY RT-PCR (RSV, FLU A&B, COVID)  RVPGX2
Influenza A by PCR: NEGATIVE
Influenza B by PCR: NEGATIVE
Resp Syncytial Virus by PCR: NEGATIVE
SARS Coronavirus 2 by RT PCR: NEGATIVE

## 2024-04-25 LAB — T4, FREE: Free T4: 1.32 ng/dL (ref 0.80–2.00)

## 2024-04-25 LAB — LIPASE, BLOOD: Lipase: 16 U/L (ref 11–51)

## 2024-04-25 LAB — PHOSPHORUS: Phosphorus: 2.4 mg/dL — ABNORMAL LOW (ref 2.5–4.6)

## 2024-04-25 MED ORDER — SODIUM CHLORIDE 0.9 % IV SOLN: 250.0000 mL | INTRAVENOUS | Status: AC | PRN

## 2024-04-25 MED ORDER — POTASSIUM CHLORIDE 10 MEQ/100ML IV SOLN
10.0000 meq | INTRAVENOUS | Status: AC
  Administered 2024-04-25 (×5): 10 meq via INTRAVENOUS
  Filled 2024-04-25: qty 100

## 2024-04-25 MED ORDER — POTASSIUM PHOSPHATES 15 MMOLE/5ML IV SOLN
30.0000 mmol | INTRAVENOUS | Status: AC
  Administered 2024-04-25: 30 mmol via INTRAVENOUS
  Filled 2024-04-25: qty 10

## 2024-04-25 MED ORDER — IPRATROPIUM-ALBUTEROL 0.5-2.5 (3) MG/3ML IN SOLN: 3.0000 mL | RESPIRATORY_TRACT | Status: DC | PRN

## 2024-04-25 MED ORDER — SODIUM CHLORIDE 0.9% FLUSH
3.0000 mL | Freq: Two times a day (BID) | INTRAVENOUS | Status: DC
  Administered 2024-04-25 – 2024-05-01 (×10): 3 mL via INTRAVENOUS

## 2024-04-25 MED ORDER — SODIUM CHLORIDE 0.9 % IV BOLUS
1000.0000 mL | Freq: Once | INTRAVENOUS | Status: AC
  Administered 2024-04-25: 1000 mL via INTRAVENOUS

## 2024-04-25 MED ORDER — LORAZEPAM 2 MG/ML IJ SOLN
1.0000 mg | Freq: Once | INTRAMUSCULAR | Status: AC
  Administered 2024-04-25: 1 mg via INTRAVENOUS
  Filled 2024-04-25: qty 1

## 2024-04-25 MED ORDER — SCOPOLAMINE 1 MG/3DAYS TD PT72
1.0000 | MEDICATED_PATCH | TRANSDERMAL | Status: DC
  Administered 2024-04-28 – 2024-05-01 (×2): 1 mg via TRANSDERMAL
  Filled 2024-04-25 (×3): qty 1

## 2024-04-25 MED ORDER — MAGNESIUM SULFATE IN D5W 1-5 GM/100ML-% IV SOLN
1.0000 g | Freq: Once | INTRAVENOUS | Status: AC
  Administered 2024-04-25: 1 g via INTRAVENOUS
  Filled 2024-04-25: qty 100

## 2024-04-25 MED ORDER — THIAMINE HCL 100 MG/ML IJ SOLN
100.0000 mg | Freq: Every day | INTRAMUSCULAR | Status: DC
  Administered 2024-04-25 – 2024-04-26 (×2): 100 mg via INTRAVENOUS
  Filled 2024-04-25 (×2): qty 2

## 2024-04-25 MED ORDER — ACETAMINOPHEN 325 MG PO TABS
650.0000 mg | ORAL_TABLET | Freq: Four times a day (QID) | ORAL | Status: DC | PRN
  Administered 2024-04-29: 650 mg via ORAL
  Filled 2024-04-25: qty 2

## 2024-04-25 MED ORDER — FOLIC ACID 1 MG PO TABS
1.0000 mg | ORAL_TABLET | Freq: Every morning | ORAL | Status: DC
  Administered 2024-04-26 – 2024-05-01 (×6): 1 mg via ORAL
  Filled 2024-04-25 (×7): qty 1

## 2024-04-25 MED ORDER — ONDANSETRON HCL 4 MG/2ML IJ SOLN
4.0000 mg | Freq: Once | INTRAMUSCULAR | Status: DC
  Filled 2024-04-25: qty 2

## 2024-04-25 MED ORDER — SODIUM CHLORIDE 0.9% FLUSH: 3.0000 mL | INTRAVENOUS | Status: DC | PRN

## 2024-04-25 MED ORDER — POTASSIUM CHLORIDE 20 MEQ PO PACK: 40.0000 meq | PACK | Freq: Two times a day (BID) | ORAL | Status: DC

## 2024-04-25 MED ORDER — SODIUM CHLORIDE 0.9% FLUSH
3.0000 mL | Freq: Two times a day (BID) | INTRAVENOUS | Status: DC
  Administered 2024-04-25 – 2024-05-01 (×9): 3 mL via INTRAVENOUS

## 2024-04-25 MED ORDER — TRIMETHOBENZAMIDE HCL 100 MG/ML IM SOLN
200.0000 mg | Freq: Four times a day (QID) | INTRAMUSCULAR | Status: DC | PRN
  Administered 2024-04-25 – 2024-04-26 (×3): 200 mg via INTRAMUSCULAR
  Filled 2024-04-25 (×4): qty 2

## 2024-04-25 MED ORDER — POTASSIUM CHLORIDE 20 MEQ PO PACK
60.0000 meq | PACK | ORAL | Status: DC
  Filled 2024-04-25: qty 3

## 2024-04-25 MED ORDER — POTASSIUM CHLORIDE CRYS ER 20 MEQ PO TBCR
40.0000 meq | EXTENDED_RELEASE_TABLET | ORAL | Status: AC
  Administered 2024-04-25: 40 meq via ORAL
  Filled 2024-04-25: qty 2

## 2024-04-25 MED ORDER — KETOROLAC TROMETHAMINE 15 MG/ML IJ SOLN
15.0000 mg | Freq: Once | INTRAMUSCULAR | Status: AC
  Administered 2024-04-25: 15 mg via INTRAVENOUS
  Filled 2024-04-25: qty 1

## 2024-04-25 MED ORDER — POTASSIUM PHOSPHATES 15 MMOLE/5ML IV SOLN: 15.0000 mmol | Freq: Once | INTRAVENOUS | Status: DC

## 2024-04-25 MED ORDER — BUSPIRONE HCL 5 MG PO TABS
15.0000 mg | ORAL_TABLET | Freq: Two times a day (BID) | ORAL | Status: DC
  Administered 2024-04-25 – 2024-05-01 (×12): 15 mg via ORAL
  Filled 2024-04-25 (×13): qty 3

## 2024-04-25 MED ORDER — ENOXAPARIN SODIUM 40 MG/0.4ML IJ SOSY
40.0000 mg | PREFILLED_SYRINGE | INTRAMUSCULAR | Status: DC
  Administered 2024-04-25 – 2024-04-30 (×6): 40 mg via SUBCUTANEOUS
  Filled 2024-04-25 (×6): qty 0.4

## 2024-04-25 MED ORDER — ACETAMINOPHEN 650 MG RE SUPP: 650.0000 mg | Freq: Four times a day (QID) | RECTAL | Status: DC | PRN

## 2024-04-25 MED ORDER — PANTOPRAZOLE SODIUM 40 MG PO TBEC
40.0000 mg | DELAYED_RELEASE_TABLET | Freq: Every morning | ORAL | Status: DC
  Administered 2024-04-26 – 2024-05-01 (×6): 40 mg via ORAL
  Filled 2024-04-25 (×7): qty 1

## 2024-04-25 MED ORDER — TRIMETHOBENZAMIDE HCL 100 MG/ML IM SOLN
200.0000 mg | Freq: Once | INTRAMUSCULAR | Status: AC
  Administered 2024-04-25: 200 mg via INTRAMUSCULAR
  Filled 2024-04-25: qty 2

## 2024-04-25 MED ORDER — DOCUSATE SODIUM 100 MG PO CAPS
100.0000 mg | ORAL_CAPSULE | Freq: Two times a day (BID) | ORAL | Status: DC
  Administered 2024-04-27 – 2024-05-01 (×7): 100 mg via ORAL
  Filled 2024-04-25 (×10): qty 1

## 2024-04-25 MED ORDER — ALBUTEROL SULFATE (2.5 MG/3ML) 0.083% IN NEBU: 2.5000 mg | INHALATION_SOLUTION | Freq: Four times a day (QID) | RESPIRATORY_TRACT | Status: DC | PRN

## 2024-04-25 MED ORDER — FLUOXETINE HCL 40 MG PO CAPS: 40.0000 mg | ORAL_CAPSULE | Freq: Every morning | ORAL | Status: DC

## 2024-04-25 NOTE — ED Notes (Signed)
 [facility] called for transport.

## 2024-04-25 NOTE — ED Notes (Signed)
 Monitor continues to show nsr without ectopy.

## 2024-04-25 NOTE — H&P (Signed)
 " History and Physical    Angela Dixon FMW:[id number] DOB: Nov 10, 1985 DOA: 04/25/2024  PCP: Angela Philippe SAUNDERS, NP   Patient coming from: Home   Chief Complaint:  Chief Complaint  Patient presents with   Emesis   ED TRIAGE note:Pt endorses exposure to flu, n/v starting yesterday   HPI:  Angela Dixon is a 39 year old female with pmh of hyperlipidemia, prolonged QT interval, GERD,  ADHD, anxiety, depression, obesity, IBS, chronic alcohol use, and cannabis use presented with complaints of intractable nausea and vomiting.  Patient denies fever, chill, constipation and diarrhea.  Denies chest pain shortness of breath and palpitation.  No other complaint at this time.   At presentation to emergency department patient is hemodynamically stable. EKG revealed QTc prolonged at 591.   Labs significant for WBC 16.8, potassium 2.9, calcium 10.6, low magnesium  1.1.  Patient had been given 1 g of magnesium  sulfate, potassium chloride  50 mEq IV, 1 L normal saline IV fluids, ketorolac  15 mg, and Ativan  1 mg IV.   Respiratory panel negative for COVID RSV flu.  UDS positive with benzodiazepine, amphetamine and THC.  While in the emergency department patient has a brief episode of sinus tachycardia with frequent PVC and brief run of appeared to be torsade.  1 g mag sulfate was given and patient heart rhythm stabilized.  EDP spoke with cardiology Dr. Driscilla who recommended admission to [facility].    EKG has been obtained total 2 times which showed normal sinus rhythm heart rate 96 and prolonged QTc 591 ms.    Additional orders placed for scopolamine  patch, and additional dose of 1 g of magnesium  sulfate. Patient has been transferred and accepted accepted to a progressive bed as observation.   Significant labs in the ED: Lab Orders         Resp panel by RT-PCR (RSV, Flu A&B, Covid) Anterior Nasal Swab         CBC with Differential         Comprehensive metabolic panel         Lipase, blood          Magnesium          Urinalysis, Routine w reflex microscopic -Urine, Clean Catch         Urine Drug Screen         Basic metabolic panel         Magnesium          CBC         Comprehensive metabolic panel         Ethanol         Phosphorus         TSH         T4, free       Review of Systems:  Review of Systems  Constitutional:  Negative for chills and fever.  Respiratory:  Negative for cough, hemoptysis, sputum production, shortness of breath and wheezing.   Cardiovascular:  Negative for chest pain and palpitations.  Gastrointestinal:  Positive for nausea and vomiting. Negative for abdominal pain, blood in stool, constipation and diarrhea.  Neurological:  Negative for dizziness and headaches.  Psychiatric/Behavioral:  The patient is not nervous/anxious.     Past Medical History:  Diagnosis Date   Abnormal Pap smear    Acid reflux    ADHD    Allergy    Anemia    Anxiety    Cannabinoid hyperemesis syndrome 02/28/2019   Chlamydia 2006   Depression  Fracture    Hypercholesterolemia    IBS (irritable bowel syndrome)    Vitamin D  deficiency     Past Surgical History:  Procedure Laterality Date   COLPOSCOPY     x 2, around 2004 and 2023   WISDOM TOOTH EXTRACTION       reports that she quit smoking about 17 years ago. Her smoking use included cigarettes. She has a 3 pack-year smoking history. She has never used smokeless tobacco. She reports current alcohol use of about 3.0 - 4.0 standard drinks of alcohol per week. She reports that she does not currently use drugs after having used the following drugs: Marijuana.  Allergies[1]  Family History  Problem Relation Age of Onset   Hyperlipidemia Mother    Osteopenia Mother    Alcohol abuse Mother    Cancer Mother        Lung   COPD Mother    Diverticulitis Father    Alcohol abuse Father    Asthma Sister    Hypertension Maternal Grandmother    Diabetes Maternal Grandmother    Heart disease Maternal Grandfather     Cancer Maternal Grandfather    COPD Maternal Grandfather    Cancer Paternal Grandmother        throat, smoker   Cancer Paternal Grandfather        lung, smoker   COPD Paternal Grandfather    Colon cancer Neg Hx    Esophageal cancer Neg Hx    Stomach cancer Neg Hx    Rectal cancer Neg Hx     Prior to Admission medications  Medication Sig Start Date End Date Taking? Authorizing Provider  acetaminophen  (TYLENOL ) 500 MG tablet Take 1,000 mg by mouth every 6 (six) hours as needed for moderate pain (pain score 4-6) or mild pain (pain score 1-3).    [provider]  albuterol  (VENTOLIN  HFA) 108 (90 Base) MCG/ACT inhaler Inhale 2 puffs into the lungs every 4 (four) hours as needed for wheezing or shortness of breath. 04/05/22   Wilkinson, Dana E, NP  amphetamine-dextroamphetamine (ADDERALL XR) 30 MG 24 hr capsule Take 30 mg by mouth in the morning.    [provider]  amphetamine-dextroamphetamine (ADDERALL) 10 MG tablet Take 10 mg by mouth daily.    [provider]  ARIPiprazole (ABILIFY) 5 MG tablet Take 5 mg by mouth in the morning.    [provider]  busPIRone  (BUSPAR ) 15 MG tablet Take 1 tablet (15 mg total) by mouth 2 (two) times daily. 03/10/23   Laurice President, NP  calcium carbonate (TUMS - DOSED IN MG ELEMENTAL CALCIUM) 500 MG chewable tablet Chew 1 tablet by mouth as needed for indigestion or heartburn.    [provider]  cetirizine (ZYRTEC) 10 MG tablet Take 10 mg by mouth in the morning.    [provider]  FLUoxetine  (PROZAC ) 40 MG capsule Take 40 mg by mouth in the morning. 12/09/23   [provider]  folic acid  (FOLVITE ) 1 MG tablet Take 1 mg by mouth in the morning. 02/14/22   [provider]  levonorgestrel (MIRENA, 52 MG,) 20 MCG/DAY IUD 1 each by Intrauterine route once. 12/23/22   [provider]  LORazepam  (ATIVAN ) 1 MG tablet Take 1 tablet (1 mg total) by mouth every 6 (six) hours as needed for  anxiety. 01/04/24   Patt Alm Macho, MD  pantoprazole  (PROTONIX ) 40 MG tablet TAKE 1 TABLET (40 MG TOTAL) BY MOUTH TWICE A DAY BEFORE MEALS Patient taking  differently: Take 40 mg by mouth in the morning. 11/27/23   Craig Alan SAUNDERS, PA-C  potassium chloride  SA (KLOR-CON  M) 20 MEQ tablet 1 tab bid x 5 days then continue 1 tab daily. Appt with pcp 02/13/24. 02/02/24   Jordan, Betty G, MD  Pseudoephedrine-Ibuprofen  (ADVIL  COLD & SINUS LIQUI-GELS PO) Take 2 capsules by mouth as needed.    [provider]  [Paused] WEGOVY 1 MG/0.5ML SOAJ SQ injection Inject 1 mg into the skin once a week. Patient not taking: Reported on 01/07/2024 Wait to take this until your doctor or other care provider tells you to start again. 12/11/23   [provider]     Physical Exam: Vitals:   04/25/24 1730 04/25/24 1800 04/25/24 1900 04/25/24 2024  BP: (!) 101/52 107/71 118/64 130/76  Pulse: (!) 102 90  88  Resp: 19 17 17 20   Temp:    98.3 F (36.8 C)  TempSrc:      SpO2: 95% 100%  100%  Weight:      Height:        Physical Exam Vitals and nursing note reviewed.  Constitutional:      Appearance: She is obese.  HENT:     Mouth/Throat:     Mouth: Mucous membranes are moist.  Eyes:     Pupils: Pupils are equal, round, and reactive to light.  Cardiovascular:     Rate and Rhythm: Normal rate and regular rhythm.     Pulses: Normal pulses.     Heart sounds: Normal heart sounds.  Pulmonary:     Effort: Pulmonary effort is normal.     Breath sounds: Normal breath sounds.  Abdominal:     Palpations: Abdomen is soft.  Musculoskeletal:     Cervical back: Neck supple.  Skin:    Capillary Refill: Capillary refill takes less than 2 seconds.  Neurological:     Mental Status: She is alert and oriented to person, place, and time.  Psychiatric:        Mood and Affect: Mood normal.      Labs on Admission: I have personally reviewed following labs and imaging studies  CBC: Recent Labs  Lab  04/25/24 0920  WBC 16.8*  NEUTROABS 15.4*  HGB 13.6  HCT 38.2  MCV 84.7  PLT 339   Basic Metabolic Panel: Recent Labs  Lab 04/25/24 0920 04/25/24 0944  NA 144  --   K 2.9*  --   CL 98  --   CO2 23  --   GLUCOSE 137*  --   BUN 9  --   CREATININE 0.74  --   CALCIUM 10.6*  --   MG  --  1.1*   GFR: Estimated Creatinine Clearance: 100.3 mL/min (by C-G formula based on SCr of 0.74 mg/dL). Liver Function Tests: Recent Labs  Lab 04/25/24 0920  AST 23  ALT 15  ALKPHOS 65  BILITOT 0.9  PROT 8.2*  ALBUMIN 5.1*   Recent Labs  Lab 04/25/24 0920  LIPASE 16   No results for input(s): AMMONIA in the last 168 hours. Coagulation Profile: No results for input(s): INR, PROTIME in the last 168 hours. Cardiac Enzymes: No results for input(s): CKTOTAL, CKMB, CKMBINDEX, TROPONINI, TROPONINIHS in the last 168 hours. BNP (last 3 results) No results for input(s): BNP in the last 8760 hours. HbA1C: No results for input(s): HGBA1C in the last 72 hours. CBG: No results for input(s): GLUCAP in the last 168 hours. Lipid Profile: No results for input(s):  CHOL, HDL, LDLCALC, TRIG, CHOLHDL, LDLDIRECT in the last 72 hours. Thyroid  Function Tests: No results for input(s): TSH, T4TOTAL, FREET4, T3FREE, THYROIDAB in the last 72 hours. Anemia Panel: No results for input(s): VITAMINB12, FOLATE, FERRITIN, TIBC, IRON, RETICCTPCT in the last 72 hours. Urine analysis:    Component Value Date/Time   COLORURINE YELLOW 04/25/2024 1244   APPEARANCEUR HAZY (A) 04/25/2024 1244   LABSPEC 1.034 (H) 04/25/2024 1244   PHURINE 6.5 04/25/2024 1244   GLUCOSEU NEGATIVE 04/25/2024 1244   HGBUR NEGATIVE 04/25/2024 1244   BILIRUBINUR NEGATIVE 04/25/2024 1244   KETONESUR >80 (A) 04/25/2024 1244   PROTEINUR 100 (A) 04/25/2024 1244   UROBILINOGEN 0.2 12/09/2013 1226   NITRITE NEGATIVE 04/25/2024 1244   LEUKOCYTESUR NEGATIVE 04/25/2024 1244     Radiological Exams on Admission: I have personally reviewed images No results found.   EKG: My personal interpretation of EKG shows: Normal sinus rhythm heart rate 96.  Prolonged QTc 591 ms.    Assessment/Plan: Principal Problem:   Cyclical vomiting syndrome Active Problems:   Intractable nausea and vomiting   Frequent PVCs   GERD (gastroesophageal reflux disease)   IBS (irritable bowel syndrome)   Chronic alcohol abuse   ADHD   Prolonged QT interval   Hypokalemia   Hypomagnesemia   Leukocytosis   Marijuana use   Torsades de pointes (HCC)    Assessment and Plan: Cyclical vomiting syndrome in the setting of marijuana use Intractable nausea vomiting -Presented to emergency department complaining of nausea vomiting as concern for exposed to flu.  Patient reported nonstop vomiting for 24 hours.  Denies any abdominal pain, diarrhea, fever and chill. - At presentation to emergency department patient is hemodynamically stable. -Labs significant for WBC 16.8, potassium 2.9, calcium 10.6, low magnesium  1.1.  Patient had been given 1 g of magnesium  sulfate, potassium chloride  50 mEq IV, 1 L normal saline IV fluids, ketorolac  15 mg, and Ativan  1 mg IV.   Respiratory panel negative for COVID RSV flu. - UDS positive with benzodiazepine, amphetamine and THC. - While in the emergency department patient has a brief episode of sinus tachycardia with frequent PVC and brief run of appeared to be torsade.  1 g mag sulfate was given and patient heart rhythm stabilized.  EDP spoke with cardiology Dr. Driscilla who recommended admission to [facility].   - EKG has been obtained total 2 times which showed normal sinus rhythm heart rate 96 and prolonged QTc 591 ms. -Cyclical vomiting syndrome-intractable nausea vomiting in the setting of chronic marijuana use. -While in the ED patient did not had any episodes of nausea vomiting.  Continue Protonix  daily, scopolamine  patch daily and Tigan  as  needed. -Counseled patient for marijuana smoking cessation.   History of frequent PVC Torsade the point-resolved Prolonged QTc -In the setting of electrolyte derangement from intractable nausea vomiting and polypharmacy due to Adderall, Prozac , Abilify and marijuana use. -Need to avoid Adderall, Prozac , Abilify and marijuana. - Need to keep mag level above 2 potassium level above 4. - Cardiology has been consulted and on board. - Obtaining TEE in the morning.  Hypokalemia Hypomagnesemia - Hypokalemia hypomagnesemia in the setting of electrolyte derangement. -Low potassium 2.9.  Repleted with IV KCl 50 mEq. - Low magnesium  1.1 repleted with mag sulfate 2 g. -Continue to check potassium, Phos and mag level.  -Low potassium ADHD - Hold Adderall in the setting of tachycardia with frequent PVC and torsade the point.  Generalized anxiety disorder -Hold Prozac   and Abilify  the  setting of prolonged QTc.  Continue BuSpar .  History of chronic marijuana use History of chronic alcohol use Patient denies alcohol use.  Continue thiamine  and folic acid .  -Checking blood alcohol level.  Continue CIWA protocol is unreliable historian.  History of asthma - Continue DuoNeb as needed  DVT prophylaxis:  Lovenox  Code Status:  Full Code Diet: Heart healthy diet Family Communication:   Family was present at bedside, at the time of interview. Opportunity was given to ask question and all questions were answered satisfactorily.  Disposition Plan: Continue monitor heart rate, improvement of nausea and vomiting.  Need to follow-up with echocardiogram result Consults: Cardiology Admission status:   Observation, Step Down Unit  Severity of Illness: The appropriate patient status for this patient is OBSERVATION. Observation status is judged to be reasonable and necessary in order to provide the required intensity of service to ensure the patient's safety. The patient's presenting symptoms, physical  exam findings, and initial radiographic and laboratory data in the context of their medical condition is felt to place them at decreased risk for further clinical deterioration. Furthermore, it is anticipated that the patient will be medically stable for discharge from the hospital within 2 midnights of admission.     Ginnette Gates, MD [facility]  How to contact the [facility] Attending or Consulting provider 7A - 7P or covering provider during after hours 7P -7A, for this patient.  Check the care team in [facility] and look for a) attending/consulting [facility] provider listed and b) the [facility] team listed Log into [email or web address] and use [place]'s universal password to access. If you do not have the password, please contact the hospital operator. Locate the [facility] provider you are looking for under [facility] and page to a number that you can be directly reached. If you still have difficulty reaching the provider, please page the [facility] (Director on Call) for the Hospitalists listed on amion for assistance.  04/25/2024, 9:46 PM           [1]  Allergies Allergen Reactions   Wellbutrin  [Bupropion ] Hives   Zoloft  [Sertraline ] Other (See Comments)    Excessive sweating, insomnia   Neomycin Rash   "

## 2024-04-25 NOTE — Plan of Care (Signed)
 Transfer from [facility] Angela Dixon is a 39 year old female with pmh of hyperlipidemia, prolonged QT interval, GERD,  ADHD, anxiety, depression, obesity, IBS,  alcohol use, and cannabis use presented with complaints of intractable nausea and vomiting.  EKG revealed QTc prolonged at 591.  Labs significant for WBC 16.8, potassium 2.9, calcium 10.6, magnesium  1.1.  Patient had been given 1 g of magnesium  sulfate, potassium chloride  50 mEq IV, 1 L normal saline IV fluids, ketorolac  15 mg, and Ativan  1 mg IV.  Patient was noted to have episodes of reported of torsades for which cardiology had been consulted and felt acceptable for progressive bed as no subsequent episodes reported after magnesium  had been given.  Additional orders placed for scopolamine  patch, and additional dose of 1 g of magnesium  sulfate.  Accepted to a progressive bed as observation.

## 2024-04-25 NOTE — Plan of Care (Signed)
 Patient declined to take oral KCl powder. Changing KCl powder to KCl pill form.  Modean Mccullum, MD [facility] 04/25/2024, 10:55 PM

## 2024-04-25 NOTE — ED Notes (Signed)
 Patient ambulated to the restroom

## 2024-04-25 NOTE — ED Notes (Signed)
 Monitor now shows nsr without ectopy with average heart rate of 90. Pt. States her nausea has abated and she feels better.

## 2024-04-25 NOTE — ED Notes (Signed)
 Pt to restroom with standby assist to provide urine

## 2024-04-25 NOTE — Consult Note (Addendum)
 "  Cardiology Consultation   Patient ID: Robecca Fulgham MRN: [id number]; DOB: 05-19-1985  Admit date: 04/25/2024 Date of Consult: 04/25/2024  PCP:  Billy Philippe SAUNDERS, NP   [facility] Providers Cardiologist:  None        Patient Profile: Arelene Moroni is a 38 y.o. female with a hx of cannabis hyperemesis syndrome, GERD, ADHD, anxiety, depression, obesity, IBS, alcohol use (2-3 drinks/day), and THC use (last use was 2 weeks ago) who is being seen 04/25/2024 for the evaluation of TdP.   History of Present Illness: Ms. Grogan reports that yesterday she is started to have nausea and frequent episodes of vomiting.  She reports she has had several similar episode in the past for which she had ED visits and admissions.No shortness of breath, chest pain, palpitation, presyncope, or syncope.  No fevers or chills. No recent medication changes.  She has been on Wegovy 1 mg for about 3 months with no recent increase in the doses.  Last dose was taken on Friday (1/16).   Today, she presents with QTc up to 590 ms in the setting of hypokalemia (K 2.9) and hypomagnesemia (Mg 1.1). No acute ischemic changes on EKG. UDS was positive for benzo (she was given ativan  in the ED), amphetamines, and THC. While in the ED at [facility], she had frequent episodes of PVCs with reportedly brief runs of torsades. She received a total of 2g of Mg and 80 meq of KcC with improvement in her rhythm.  Review of prior EKGs shows QTc of 500 ms in 02/2023.  Prior to that, she had normal QTc on EKG from 01/2020.  In 12/2023, she had QTc up to 580 ms, which was in the setting of hypokalemia with down to 2.9 and hypomagnesemia with Mg down to 1.3.    Past Medical History:  Diagnosis Date   Abnormal Pap smear    Acid reflux    ADHD    Allergy    Anemia    Anxiety    Cannabinoid hyperemesis syndrome 02/28/2019   Chlamydia 2006   Depression    Fracture    Hypercholesterolemia    IBS (irritable bowel syndrome)     Vitamin D  deficiency     Past Surgical History:  Procedure Laterality Date   COLPOSCOPY     x 2, around 2004 and 2023   WISDOM TOOTH EXTRACTION       Home Medications:  Prior to Admission medications  Medication Sig Start Date End Date Taking? Authorizing Provider  acetaminophen  (TYLENOL ) 500 MG tablet Take 1,000 mg by mouth every 6 (six) hours as needed for moderate pain (pain score 4-6) or mild pain (pain score 1-3).    [provider]  albuterol  (VENTOLIN  HFA) 108 (90 Base) MCG/ACT inhaler Inhale 2 puffs into the lungs every 4 (four) hours as needed for wheezing or shortness of breath. 04/05/22   Wilkinson, Dana E, NP  amphetamine-dextroamphetamine (ADDERALL XR) 30 MG 24 hr capsule Take 30 mg by mouth in the morning.    [provider]  amphetamine-dextroamphetamine (ADDERALL) 10 MG tablet Take 10 mg by mouth daily.    [provider]  ARIPiprazole (ABILIFY) 5 MG tablet Take 5 mg by mouth in the morning.    [provider]  busPIRone  (BUSPAR ) 15 MG tablet Take 1 tablet (15 mg total) by mouth 2 (two) times daily. 03/10/23   Cranford, Tonya, NP  calcium carbonate (TUMS - DOSED IN MG ELEMENTAL CALCIUM) 500 MG chewable tablet Chew  1 tablet by mouth as needed for indigestion or heartburn.    [provider]  cetirizine (ZYRTEC) 10 MG tablet Take 10 mg by mouth in the morning.    [provider]  FLUoxetine  (PROZAC ) 40 MG capsule Take 40 mg by mouth in the morning. 12/09/23   [provider]  folic acid  (FOLVITE ) 1 MG tablet Take 1 mg by mouth in the morning. 02/14/22   [provider]  levonorgestrel (MIRENA, 52 MG,) 20 MCG/DAY IUD 1 each by Intrauterine route once. 12/23/22   [provider]  LORazepam  (ATIVAN ) 1 MG tablet Take 1 tablet (1 mg total) by mouth every 6 (six) hours as needed for anxiety. 01/04/24   Patt Alm Macho, MD  pantoprazole  (PROTONIX ) 40 MG tablet TAKE 1 TABLET (40 MG TOTAL) BY MOUTH TWICE A DAY  BEFORE MEALS Patient taking differently: Take 40 mg by mouth in the morning. 11/27/23   Craig Alan SAUNDERS, PA-C  potassium chloride  SA (KLOR-CON  M) 20 MEQ tablet 1 tab bid x 5 days then continue 1 tab daily. Appt with pcp 02/13/24. 02/02/24   Jordan, Betty G, MD  Pseudoephedrine-Ibuprofen  (ADVIL  COLD & SINUS LIQUI-GELS PO) Take 2 capsules by mouth as needed.    [provider]  [Paused] WEGOVY 1 MG/0.5ML SOAJ SQ injection Inject 1 mg into the skin once a week. Patient not taking: Reported on 01/07/2024 Wait to take this until your doctor or other care provider tells you to start again. 12/11/23   [provider]    Scheduled Meds:  busPIRone   15 mg Oral BID   docusate sodium   100 mg Oral BID   enoxaparin  (LOVENOX ) injection  40 mg Subcutaneous Q24H   [START ON 04/26/2024] folic acid   1 mg Oral q AM   [START ON 04/26/2024] pantoprazole   40 mg Oral q AM   scopolamine   1 patch Transdermal Q72H   sodium chloride  flush  3 mL Intravenous Q12H   sodium chloride  flush  3 mL Intravenous Q12H   thiamine  (VITAMIN B1) injection  100 mg Intravenous Daily   Continuous Infusions:  sodium chloride      PRN Meds: sodium chloride , acetaminophen  **OR** acetaminophen , ipratropium-albuterol , sodium chloride  flush, trimethobenzamide   Allergies:   Allergies[1]  Social History:   Social History   Socioeconomic History   Marital status: Divorced    Spouse name: Not on file   Number of children: 2   Years of education: Not on file   Highest education level: Master's degree (e.g., MA, MS, MEng, MEd, MSW, MBA)  Occupational History   Not on file  Tobacco Use   Smoking status: Former    Current packs/day: 0.00    Average packs/day: 1 pack/day for 3.0 years (3.0 ttl pk-yrs)    Types: Cigarettes    Quit date: 07/09/2006    Years since quitting: 17.8   Smokeless tobacco: Never  Vaping Use   Vaping status: Never Used  Substance and Sexual Activity   Alcohol use: Yes    Alcohol/week: 3.0 -  4.0 standard drinks of alcohol    Types: 3 - 4 Glasses of wine per week   Drug use: Not Currently    Types: Marijuana    Comment: last 05/2019   Sexual activity: Not Currently    Partners: Male    Birth control/protection: Condom, I.U.D.    Comment: condoms intermittent, lifetime partners 97  Other Topics Concern   Not on file  Social History Narrative   Not on file  Social Drivers of Health   Tobacco Use: Medium Risk (04/25/2024)   Patient History    Smoking Tobacco Use: Former    Smokeless Tobacco Use: Never    Passive Exposure: Not on file  Financial Resource Strain: Low Risk (10/21/2023)   Overall Financial Resource Strain (CARDIA)    Difficulty of Paying Living Expenses: Not hard at all  Food Insecurity: No Food Insecurity (12/28/2023)   Epic    Worried About Programme Researcher, Broadcasting/film/video in the Last Year: Never true    Ran Out of Food in the Last Year: Never true  Transportation Needs: No Transportation Needs (12/28/2023)   Epic    Lack of Transportation (Medical): No    Lack of Transportation (Non-Medical): No  Physical Activity: Insufficiently Active (10/21/2023)   Exercise Vital Sign    Days of Exercise per Week: 4 days    Minutes of Exercise per Session: 20 min  Stress: Stress Concern Present (10/21/2023)   [employer] of Occupational Health - Occupational Stress Questionnaire    Feeling of Stress: To some extent  Social Connections: Socially Isolated (10/21/2023)   Social Connection and Isolation Panel    Frequency of Communication with Friends and Family: Twice a week    Frequency of Social Gatherings with Friends and Family: More than three times a week    Attends Religious Services: Never    Database Administrator or Organizations: No    Attends Engineer, Structural: Not on file    Marital Status: Divorced  Intimate Partner Violence: Not At Risk (12/28/2023)   Epic    Fear of Current or Ex-Partner: No    Emotionally Abused: No    Physically Abused: No     Sexually Abused: No  Depression (PHQ2-9): Low Risk (10/22/2023)   Depression (PHQ2-9)    PHQ-2 Score: 3  Alcohol Screen: Low Risk (10/21/2023)   Alcohol Screen    Last Alcohol Screening Score (AUDIT): 5  Housing: Low Risk (12/28/2023)   Epic    Unable to Pay for Housing in the Last Year: No    Number of Times Moved in the Last Year: 0    Homeless in the Last Year: No  Utilities: Not At Risk (12/28/2023)   Epic    Threatened with loss of utilities: No  Health Literacy: Adequate Health Literacy (10/22/2023)   B1300 Health Literacy    Frequency of need for help with medical instructions: Never    Family History:   Family History  Problem Relation Age of Onset   Hyperlipidemia Mother    Osteopenia Mother    Alcohol abuse Mother    Cancer Mother        Lung   COPD Mother    Diverticulitis Father    Alcohol abuse Father    Asthma Sister    Hypertension Maternal Grandmother    Diabetes Maternal Grandmother    Heart disease Maternal Grandfather    Cancer Maternal Grandfather    COPD Maternal Grandfather    Cancer Paternal Grandmother        throat, smoker   Cancer Paternal Grandfather        lung, smoker   COPD Paternal Grandfather    Colon cancer Neg Hx    Esophageal cancer Neg Hx    Stomach cancer Neg Hx    Rectal cancer Neg Hx      ROS:  Please see the history of present illness.   All other ROS reviewed and negative.  Physical Exam/Data: Vitals:   04/25/24 1730 04/25/24 1800 04/25/24 1900 04/25/24 2024  BP: (!) 101/52 107/71 118/64 130/76  Pulse: (!) 102 90  88  Resp: 19 17 17 20   Temp:    98.3 F (36.8 C)  TempSrc:      SpO2: 95% 100%  100%  Weight:      Height:        Intake/Output Summary (Last 24 hours) at 04/25/2024 2110 Last data filed at 04/25/2024 2033 Gross per 24 hour  Intake 270.05 ml  Output 0 ml  Net 270.05 ml      04/25/2024    9:03 AM 04/25/2024    9:01 AM 01/07/2024    8:07 AM  Last 3 Weights  Weight (lbs) 190 lb 190 lb 178 lb 9.6 oz   Weight (kg) 86.183 kg 86.183 kg 81.012 kg     Body mass index is 32.61 kg/m.  General:  Well nourished, well developed, in no acute distress HEENT: normal Neck: no JVD Vascular: Distal pulses 2+ bilaterally Cardiac:  normal S1, S2; RRR; no murmur  Lungs:  clear to auscultation bilaterally, no wheezing, rhonchi or rales  Abd: soft, nontender Ext: no edema Musculoskeletal:  No deformities, BUE and BLE strength normal and equal Skin: warm and dry  Neuro:  CNs 2-12 intact, no focal abnormalities noted Psych:  Normal affect   EKG:  The EKG was personally reviewed and demonstrates:  normal sinus rhythm with prolonged Qtc (591 ms)   Relevant CV Studies: none  Laboratory Data: High Sensitivity Troponin:  No results for input(s): TROPONINIHS in the last 720 hours. No results for input(s): TRNPT in the last 720 hours.    Chemistry Recent Labs  Lab 04/25/24 0920 04/25/24 0944  NA 144  --   K 2.9*  --   CL 98  --   CO2 23  --   GLUCOSE 137*  --   BUN 9  --   CREATININE 0.74  --   CALCIUM 10.6*  --   MG  --  1.1*  GFRNONAA >60  --   ANIONGAP 22*  --     Recent Labs  Lab 04/25/24 0920  PROT 8.2*  ALBUMIN 5.1*  AST 23  ALT 15  ALKPHOS 65  BILITOT 0.9   Lipids No results for input(s): CHOL, TRIG, HDL, LABVLDL, LDLCALC, CHOLHDL in the last 168 hours.  Hematology Recent Labs  Lab 04/25/24 0920  WBC 16.8*  RBC 4.51  HGB 13.6  HCT 38.2  MCV 84.7  MCH 30.2  MCHC 35.6  RDW 13.4  PLT 339   Thyroid  No results for input(s): TSH, FREET4 in the last 168 hours.  BNPNo results for input(s): BNP, PROBNP in the last 168 hours.  DDimer No results for input(s): DDIMER in the last 168 hours.  Radiology/Studies:  No results found.   Assessment and Plan: Prolonged QTc in the setting of hypokalemia and hypomagnesemia  TdP Presents with nausea and vomiting; she was found to be hypokalemic to 2.9 and hypomagnesemic to 1.1, which was complicated by  prolonged QTc and brief episodes of TdP while at [facility] ED (I don't see the rhythm strips). She's also on Prozac , which's likely contributing to QTc prolongation (although it's typically a mild QTc prolonging agent). Review of her prior EKGs shows correlation between when she had prolonged QTc and electrolyte disturbance (namely hypokalemia and hypomagnesemia), which likely suggests acquired long QTc rather than congenital LQTS.  - Continue aggressive repletion of electrolytes (Keep  K >4 and Mg >2); check K and Mg now - TTE in the AM - Avoid QTc prolonging agents (would hold Prozac  for now) - Continue to monitor on telemetry  - Repeat EKG to follow up QTc in the AM  Risk Assessment/Risk Scores:              For questions or updates, please contact [facility] Please consult [email or web address] for contact info under      Signed, Gillian CHRISTELLA Cass, MD  04/25/2024 9:10 PM      [1]  Allergies Allergen Reactions   Wellbutrin  [Bupropion ] Hives   Zoloft  [Sertraline ] Other (See Comments)    Excessive sweating, insomnia   Neomycin Rash   "

## 2024-04-25 NOTE — ED Provider Notes (Signed)
 " Angela Dixon EMERGENCY DEPARTMENT AT [facility] Provider Note   CSN: [id number] Arrival date & time: 04/25/24  9150     Patient presents with: Emesis   Angela Dixon is a 39 y.o. female patient with history of CHS does not actively smoke marijuana anymore, reflux, irritable syndrome who presents to the emergency department today for further evaluation of intractable nausea and vomiting that started yesterday.  She states that her son tested positive for influenza B on Friday.  Her symptoms started yesterday and she has been vomiting nonstop for 24 hours.  She denies any diarrhea but does endorse some associated myalgias.  No fevers or chills.    Emesis      Prior to Admission medications  Medication Sig Start Date End Date Taking? Authorizing Provider  acetaminophen  (TYLENOL ) 500 MG tablet Take 1,000 mg by mouth every 6 (six) hours as needed for moderate pain (pain score 4-6) or mild pain (pain score 1-3).    [provider]  albuterol  (VENTOLIN  HFA) 108 (90 Base) MCG/ACT inhaler Inhale 2 puffs into the lungs every 4 (four) hours as needed for wheezing or shortness of breath. 04/05/22   Wilkinson, Dana E, NP  amphetamine-dextroamphetamine (ADDERALL XR) 30 MG 24 hr capsule Take 30 mg by mouth in the morning.    [provider]  amphetamine-dextroamphetamine (ADDERALL) 10 MG tablet Take 10 mg by mouth daily.    [provider]  ARIPiprazole (ABILIFY) 5 MG tablet Take 5 mg by mouth in the morning.    [provider]  busPIRone  (BUSPAR ) 15 MG tablet Take 1 tablet (15 mg total) by mouth 2 (two) times daily. 03/10/23   Laurice President, NP  calcium carbonate (TUMS - DOSED IN MG ELEMENTAL CALCIUM) 500 MG chewable tablet Chew 1 tablet by mouth as needed for indigestion or heartburn.    [provider]  cetirizine (ZYRTEC) 10 MG tablet Take 10 mg by mouth in the morning.    [provider]  FLUoxetine  (PROZAC ) 40 MG capsule Take 40 mg by  mouth in the morning. 12/09/23   [provider]  folic acid  (FOLVITE ) 1 MG tablet Take 1 mg by mouth in the morning. 02/14/22   [provider]  levonorgestrel (MIRENA, 52 MG,) 20 MCG/DAY IUD 1 each by Intrauterine route once. 12/23/22   [provider]  LORazepam  (ATIVAN ) 1 MG tablet Take 1 tablet (1 mg total) by mouth every 6 (six) hours as needed for anxiety. 01/04/24   Patt Alm Macho, MD  pantoprazole  (PROTONIX ) 40 MG tablet TAKE 1 TABLET (40 MG TOTAL) BY MOUTH TWICE A DAY BEFORE MEALS Patient taking differently: Take 40 mg by mouth in the morning. 11/27/23   Craig Alan SAUNDERS, PA-C  potassium chloride  SA (KLOR-CON  M) 20 MEQ tablet 1 tab bid x 5 days then continue 1 tab daily. Appt with pcp 02/13/24. 02/02/24   Jordan, Betty G, MD  Pseudoephedrine-Ibuprofen  (ADVIL  COLD & SINUS LIQUI-GELS PO) Take 2 capsules by mouth as needed.    [provider]  [Paused] WEGOVY 1 MG/0.5ML SOAJ SQ injection Inject 1 mg into the skin once a week. Patient not taking: Reported on 01/07/2024 Wait to take this until your doctor or other care provider tells you to start again. 12/11/23   [provider]    Allergies: Wellbutrin  [bupropion ], Zoloft  [sertraline ], and Neomycin    Review of Systems  Gastrointestinal:  Positive for vomiting.  All other systems reviewed and are negative.   Updated Vital  Signs BP (!) 144/84   Pulse 93   Temp 98.7 F (37.1 C) (Oral)   Resp 17   Ht 5' 4 (1.626 m)   Wt 86.2 kg   SpO2 99%   BMI 32.61 kg/m   Physical Exam Vitals and nursing note reviewed.  Constitutional:      General: She is not in acute distress.    Appearance: Normal appearance.  HENT:     Head: Normocephalic and atraumatic.  Eyes:     General:        Right eye: No discharge.        Left eye: No discharge.  Cardiovascular:     Comments: Regular rate and rhythm.  S1/S2 are distinct without any evidence of murmur, rubs, or gallops.  Radial pulses are 2+  bilaterally.  Dorsalis pedis pulses are 2+ bilaterally.  No evidence of pedal edema. Pulmonary:     Comments: Clear to auscultation bilaterally.  Normal effort.  No respiratory distress.  No evidence of wheezes, rales, or rhonchi heard throughout. Abdominal:     General: Abdomen is flat. Bowel sounds are normal. There is no distension.     Tenderness: There is no abdominal tenderness. There is no guarding or rebound.  Musculoskeletal:        General: Normal range of motion.     Cervical back: Neck supple.  Skin:    General: Skin is warm and dry.     Findings: No rash.  Neurological:     General: No focal deficit present.     Mental Status: She is alert.  Psychiatric:        Mood and Affect: Mood normal.        Behavior: Behavior normal.     (all labs ordered are listed, but only abnormal results are displayed) Labs Reviewed  CBC WITH DIFFERENTIAL/PLATELET - Abnormal; Notable for the following components:      Result Value   WBC 16.8 (*)    Neutro Abs 15.4 (*)    Abs Immature Granulocytes 0.08 (*)    All other components within normal limits  COMPREHENSIVE METABOLIC PANEL WITH GFR - Abnormal; Notable for the following components:   Potassium 2.9 (*)    Glucose, Bld 137 (*)    Calcium 10.6 (*)    Total Protein 8.2 (*)    Albumin 5.1 (*)    Anion gap 22 (*)    All other components within normal limits  MAGNESIUM  - Abnormal; Notable for the following components:   Magnesium  1.1 (*)    All other components within normal limits  URINALYSIS, ROUTINE W REFLEX MICROSCOPIC - Abnormal; Notable for the following components:   APPearance HAZY (*)    Specific Gravity, Urine 1.034 (*)    Ketones, ur >80 (*)    Protein, ur 100 (*)    All other components within normal limits  RESP PANEL BY RT-PCR (RSV, FLU A&B, COVID)  RVPGX2  LIPASE, BLOOD  URINE DRUG SCREEN    EKG: EKG Interpretation Date/Time:  Sunday April 25 2024 09:26:52 EST Ventricular Rate:  96 PR Interval:  121 QRS  Duration:  100 QT Interval:  467 QTC Calculation: 591 R Axis:   84  Text Interpretation: Sinus rhythm Left atrial enlargement Prolonged QT interval Confirmed by Ula Barter [phone]) on 04/25/2024 10:08:21 AM  Radiology: No results found.   .Critical Care  Performed by: Theotis Cameron HERO, PA-C Authorized by: Theotis Cameron HERO, PA-C   Critical care provider statement:  Critical care time (minutes):  35   Critical care time was exclusive of:  Separately billable procedures and treating other patients   Critical care was necessary to treat or prevent imminent or life-threatening deterioration of the following conditions:  Cardiac failure   Critical care was time spent personally by me on the following activities:  Blood draw for specimens, development of treatment plan with patient or surrogate, discussions with consultants, discussions with primary provider, ordering and performing treatments and interventions, ordering and review of laboratory studies, ordering and review of radiographic studies, pulse oximetry and re-evaluation of patient's condition    Medications Ordered in the ED  potassium chloride  10 mEq in 100 mL IVPB (10 mEq Intravenous New Bag/Given 04/25/24 1243)  magnesium  sulfate IVPB 1 g 100 mL (1 g Intravenous New Bag/Given 04/25/24 1224)  scopolamine  (TRANSDERM-SCOP) 1 MG/3DAYS 1 mg (has no administration in time range)  sodium chloride  0.9 % bolus 1,000 mL (1,000 mLs Intravenous New Bag/Given 04/25/24 0933)  ketorolac  (TORADOL ) 15 MG/ML injection 15 mg (15 mg Intravenous Given 04/25/24 0931)  LORazepam  (ATIVAN ) injection 1 mg (1 mg Intravenous Given 04/25/24 0937)  magnesium  sulfate IVPB 1 g 100 mL (0 g Intravenous Stopped 04/25/24 1004)    Clinical Course as of 04/25/24 1314  Sun Apr 25, 2024  1124 CBC with Differential(!) There is evidence of leukocytosis with neutrophilic predominance. [CF]  1124 Comprehensive metabolic panel(!) Severe hypokalemia. [CF]  1124  Magnesium (!) Severe hypomagnesemia. [CF]  1124 Lipase, blood Negative. [CF]  1124 Resp panel by RT-PCR (RSV, Flu A&B, Covid) Anterior Nasal Swab Negative. [CF]  1124 On repeat evaluation, patient is still having some episodes of mild vomiting.  More dry heaves.  Patient agreeable with admission today.  Fluids are running, magnesium  is running, and potassium is running as well.  Patient did have some evidence of very frequent PVCs and some brief runs of what appeared to be torsades.  1 g of magnesium  was promptly given and her rhythm has stabilized. [CF]  1125 We spoke with Dr. Lavona with cardiology who recommends admission over at [facility] given the borderline torsades. [CF]  1131 I spoke with Dr. Claudene with [facility] hospitalist who agrees to admit the patient. [CF]    Clinical Course User Index [CF] Theotis Cameron HERO, PA-C    Medical Decision Making Genevieve Arbaugh is a 39 y.o. female patient who presents to the emergency department today for further evaluation of intractable nausea and vomiting.  Differential diagnosis includes gastroenteritis, influenza, pancreatitis, hepatobiliary pathology is a less likely as patient has a benign belly exam.  Will go ahead and get labs and some fluids.  She does appear to be dry and dehydrated.  Do not feel that imaging is warranted at this time.  She has a normal abdominal exam  As highlighted in the ED course, patient did have very frequent PVCs and a couple of short runs what appears to be torsades.  This was quickly terminated after 1 g of magnesium .  Patient's labs do show significant hypokalemia and hypomagnesemia which is likely the culprit.  These are both being actively repleted.  After speaking with cardiology we will admit her to a stepdown unit.  I spoke with [facility] hospitalist who agrees to admit the patient.  Will hold off on any QT prolonging agents at this time as her QT is 600.   Amount and/or Complexity of Data Reviewed Labs: ordered.  Decision-making details documented in ED Course.  Risk Prescription drug management. Decision regarding  hospitalization.     Final diagnoses:  Intractable nausea and vomiting  Hypokalemia  Hypomagnesemia    ED Discharge Orders     None          Theotis Cameron HERO, [place] 04/25/24 1314    Ula Prentice SAUNDERS, MD 04/25/24 1543  "

## 2024-04-25 NOTE — ED Notes (Signed)
 Report given to Doyal Peak at [facility].

## 2024-04-25 NOTE — ED Notes (Addendum)
 Contacted [facility] pharm re: scopolamine  which is not in stock at [facility]. Pharmacist will consult with admitting provider to suggest order change

## 2024-04-25 NOTE — ED Triage Notes (Signed)
 Pt endorses exposure to flu, n/v starting yesterday

## 2024-04-26 ENCOUNTER — Observation Stay (HOSPITAL_COMMUNITY)

## 2024-04-26 DIAGNOSIS — E7800 Pure hypercholesterolemia, unspecified: Secondary | ICD-10-CM | POA: Diagnosis present

## 2024-04-26 DIAGNOSIS — K219 Gastro-esophageal reflux disease without esophagitis: Secondary | ICD-10-CM | POA: Diagnosis present

## 2024-04-26 DIAGNOSIS — J45909 Unspecified asthma, uncomplicated: Secondary | ICD-10-CM | POA: Diagnosis present

## 2024-04-26 DIAGNOSIS — Z6832 Body mass index (BMI) 32.0-32.9, adult: Secondary | ICD-10-CM | POA: Diagnosis not present

## 2024-04-26 DIAGNOSIS — T50995A Adverse effect of other drugs, medicaments and biological substances, initial encounter: Secondary | ICD-10-CM | POA: Diagnosis present

## 2024-04-26 DIAGNOSIS — Z8249 Family history of ischemic heart disease and other diseases of the circulatory system: Secondary | ICD-10-CM | POA: Diagnosis not present

## 2024-04-26 DIAGNOSIS — Z833 Family history of diabetes mellitus: Secondary | ICD-10-CM | POA: Diagnosis not present

## 2024-04-26 DIAGNOSIS — I3481 Nonrheumatic mitral (valve) annulus calcification: Secondary | ICD-10-CM

## 2024-04-26 DIAGNOSIS — R1115 Cyclical vomiting syndrome unrelated to migraine: Secondary | ICD-10-CM | POA: Diagnosis not present

## 2024-04-26 DIAGNOSIS — F909 Attention-deficit hyperactivity disorder, unspecified type: Secondary | ICD-10-CM | POA: Diagnosis present

## 2024-04-26 DIAGNOSIS — F1010 Alcohol abuse, uncomplicated: Secondary | ICD-10-CM | POA: Diagnosis present

## 2024-04-26 DIAGNOSIS — Z87891 Personal history of nicotine dependence: Secondary | ICD-10-CM | POA: Diagnosis not present

## 2024-04-26 DIAGNOSIS — Z1152 Encounter for screening for COVID-19: Secondary | ICD-10-CM | POA: Diagnosis not present

## 2024-04-26 DIAGNOSIS — I4721 Torsades de pointes: Secondary | ICD-10-CM | POA: Diagnosis present

## 2024-04-26 DIAGNOSIS — E876 Hypokalemia: Secondary | ICD-10-CM | POA: Diagnosis present

## 2024-04-26 DIAGNOSIS — E860 Dehydration: Secondary | ICD-10-CM | POA: Diagnosis present

## 2024-04-26 DIAGNOSIS — K589 Irritable bowel syndrome without diarrhea: Secondary | ICD-10-CM | POA: Diagnosis present

## 2024-04-26 DIAGNOSIS — F411 Generalized anxiety disorder: Secondary | ICD-10-CM | POA: Diagnosis present

## 2024-04-26 DIAGNOSIS — Z888 Allergy status to other drugs, medicaments and biological substances status: Secondary | ICD-10-CM | POA: Diagnosis not present

## 2024-04-26 DIAGNOSIS — E871 Hypo-osmolality and hyponatremia: Secondary | ICD-10-CM | POA: Diagnosis present

## 2024-04-26 DIAGNOSIS — E66811 Obesity, class 1: Secondary | ICD-10-CM | POA: Diagnosis present

## 2024-04-26 DIAGNOSIS — I493 Ventricular premature depolarization: Secondary | ICD-10-CM | POA: Diagnosis present

## 2024-04-26 DIAGNOSIS — F32A Depression, unspecified: Secondary | ICD-10-CM | POA: Diagnosis present

## 2024-04-26 DIAGNOSIS — Z79899 Other long term (current) drug therapy: Secondary | ICD-10-CM | POA: Diagnosis not present

## 2024-04-26 LAB — ECHOCARDIOGRAM COMPLETE
Area-P 1/2: 4.21 cm2
Calc EF: 67.6 %
Height: 64 in
S' Lateral: 2.9 cm
Single Plane A2C EF: 72.1 %
Single Plane A4C EF: 60.6 %
Weight: 3040 [oz_av]

## 2024-04-26 LAB — CBC
HCT: 34.9 % — ABNORMAL LOW (ref 36.0–46.0)
Hemoglobin: 12.4 g/dL (ref 12.0–15.0)
MCH: 30.5 pg (ref 26.0–34.0)
MCHC: 35.5 g/dL (ref 30.0–36.0)
MCV: 85.7 fL (ref 80.0–100.0)
Platelets: 274 K/uL (ref 150–400)
RBC: 4.07 MIL/uL (ref 3.87–5.11)
RDW: 13.5 % (ref 11.5–15.5)
WBC: 13.9 K/uL — ABNORMAL HIGH (ref 4.0–10.5)
nRBC: 0 % (ref 0.0–0.2)

## 2024-04-26 LAB — COMPREHENSIVE METABOLIC PANEL WITH GFR
ALT: 12 U/L (ref 0–44)
AST: 21 U/L (ref 15–41)
Albumin: 4.4 g/dL (ref 3.5–5.0)
Alkaline Phosphatase: 52 U/L (ref 38–126)
Anion gap: 15 (ref 5–15)
BUN: 7 mg/dL (ref 6–20)
CO2: 25 mmol/L (ref 22–32)
Calcium: 8.6 mg/dL — ABNORMAL LOW (ref 8.9–10.3)
Chloride: 99 mmol/L (ref 98–111)
Creatinine, Ser: 0.63 mg/dL (ref 0.44–1.00)
GFR, Estimated: >60 mL/min
Glucose, Bld: 113 mg/dL — ABNORMAL HIGH (ref 70–99)
Potassium: 2.8 mmol/L — ABNORMAL LOW (ref 3.5–5.1)
Sodium: 139 mmol/L (ref 135–145)
Total Bilirubin: 0.7 mg/dL (ref 0.0–1.2)
Total Protein: 6.9 g/dL (ref 6.5–8.1)

## 2024-04-26 MED ORDER — METOCLOPRAMIDE HCL 5 MG/ML IJ SOLN
10.0000 mg | Freq: Three times a day (TID) | INTRAMUSCULAR | Status: DC | PRN
  Administered 2024-04-26 – 2024-04-30 (×7): 10 mg via INTRAVENOUS
  Filled 2024-04-26 (×7): qty 2

## 2024-04-26 MED ORDER — ALUM & MAG HYDROXIDE-SIMETH 200-200-20 MG/5ML PO SUSP
30.0000 mL | Freq: Four times a day (QID) | ORAL | Status: DC | PRN
  Administered 2024-04-26: 30 mL via ORAL
  Filled 2024-04-26: qty 30

## 2024-04-26 MED ORDER — POTASSIUM PHOSPHATES 15 MMOLE/5ML IV SOLN: 30.0000 mmol | Freq: Once | INTRAVENOUS | Status: DC

## 2024-04-26 MED ORDER — POTASSIUM CHLORIDE 10 MEQ/100ML IV SOLN
10.0000 meq | INTRAVENOUS | Status: AC
  Administered 2024-04-26 (×5): 10 meq via INTRAVENOUS
  Filled 2024-04-26 (×5): qty 100

## 2024-04-26 NOTE — Plan of Care (Signed)

## 2024-04-26 NOTE — Progress Notes (Addendum)
 Still having nausea and vomiting despite PRN Tigan . Verbalized little relief from the medicine. MD notified. Ice chips given for comfort.

## 2024-04-26 NOTE — Progress Notes (Signed)
"  °  Progress Note  Patient Name: Angela Dixon Date of Encounter: 04/26/2024 [facility] Cardiologist: None   Interval Summary   Remains somewhat nauseated and has vomited a few times.  K being supplemented; K remains < 3 this AM  No events on telemetry  Vital Signs Vitals:   04/25/24 1900 04/25/24 2024 04/26/24 0526 04/26/24 0841  BP: 118/64 130/76 120/71 (!) 161/95  Pulse:  88 89 90  Resp: 17 20 18 18   Temp:  98.3 F (36.8 C) 98.3 F (36.8 C) 98 F (36.7 C)  TempSrc:      SpO2:  100% 99% 100%  Weight:      Height:        Intake/Output Summary (Last 24 hours) at 04/26/2024 0901 Last data filed at 04/26/2024 0300 Gross per 24 hour  Intake 550.43 ml  Output 0 ml  Net 550.43 ml      04/25/2024    9:03 AM 04/25/2024    9:01 AM 01/07/2024    8:07 AM  Last 3 Weights  Weight (lbs) 190 lb 190 lb 178 lb 9.6 oz  Weight (kg) 86.183 kg 86.183 kg 81.012 kg      Telemetry/ECG  NSR - Personally Reviewed  Physical Exam  GEN: No acute distress.   Neck: No JVD Cardiac: RRR, no murmurs, rubs, or gallops.  Respiratory: Clear to auscultation bilaterally. GI: Soft, nontender, non-distended  MS: No edema  Assessment & Plan  Torsade de Pointes Reportedly seen at [facility] ED 1/18 Presented with nausea and frequent emesis 2/2 to cannabis hyperemesis syndrome? Continue to aggressively replete K to > 4, mag > 2 TTE today Prior EKGs show normal QTC No congenital LQTS suspected   For questions or updates, please contact [facility] Please consult [email or web address] for contact info under         Signed, Harlan Ervine K Beatrix Breece, MD   "

## 2024-04-26 NOTE — Plan of Care (Signed)
   Problem: Clinical Measurements: Goal: Ability to maintain clinical measurements within normal limits will improve Outcome: Progressing Goal: Will remain free from infection Outcome: Progressing   Problem: Activity: Goal: Risk for activity intolerance will decrease Outcome: Progressing

## 2024-04-26 NOTE — Progress Notes (Signed)
 " PROGRESS NOTE    Angela Dixon  FMW:[id number] DOB: [birth year] DOA: 04/25/2024 PCP: Billy Philippe SAUNDERS, NP   Brief Narrative:  This 39 years old female with PMH significant of hyperlipidemia, prolonged QT interval, GERD,  ADHD, anxiety, depression, obesity, IBS, chronic alcohol use, and cannabis use presented with complaints of intractable nausea and vomiting.  Patient is hemodynamically stable.  EKG shows prolonged QTc at 591.  Labs significant for WBC 16.8, potassium 2.9, calcium 10.6, magnesium  1.1.  Patient was given electrolyte replacement and IV hydration.  Respiratory viral panel negative.  UDS positive for benzos, amphetamine and THC.  While in the ED patient had a brief episode of sinus tachycardia with frequent PVCs and a brief run of appeared to be torsades.  Patient has received magnesium  sulfate and heart rhythm stabilized.  Patient was admitted for further evaluation and started on scopolamine  patch for nausea and vomiting.  Assessment & Plan:   Principal Problem:   Cyclical vomiting syndrome Active Problems:   Intractable nausea and vomiting   Frequent PVCs   GERD (gastroesophageal reflux disease)   IBS (irritable bowel syndrome)   Chronic alcohol abuse   ADHD   Prolonged QT interval   Hypokalemia   Hypomagnesemia   Leukocytosis   Marijuana use   Torsades de pointes (HCC)  Cyclical vomiting syndrome in the setting of marijuana use: Intractable Nausea / Vomiting: Patient presented in the ED with nausea and vomiting, There was concern for flu exposure.   Patient reported nonstop vomiting for 24 hours.  Denies any abdominal pain, diarrhea, fever and chill. Labs significant for WBC 16.8, potassium 2.9, calcium 10.6, low magnesium  1.1.   Respiratory panel negative for COVID RSV flu. UDS positive with benzodiazepine, amphetamine and THC. Patient received electrolyte replacement, antinausea medication. Patient has a brief episode of sinus tachycardia with frequent PVC and  brief run of appeared to be torsade.   Patient heart rhythm stabilized after receiving 1 g of magnesium  sulfate.   EKG has been obtained total 2 times which showed normal sinus rhythm heart rate 96 and prolonged QTc 591 ms. While in the ED patient did not had any episodes of nausea / vomiting.   Continue Protonix  daily, scopolamine  patch daily and Tigan  as needed. Counseled patient for marijuana smoking cessation.   History of frequent PVC: Torsade the pointes - resolved. Prolonged QTC Likely In the setting of electrolyte derangement from intractable nausea, vomiting and polypharmacy due to Adderall, Prozac , Abilify and marijuana use. Need to avoid Adderall, Prozac , Abilify and marijuana. Need to keep mag level above 2 potassium level above 4. Cardiology has been consulted and on board. TTE completed report pending.  Hypokalemia / Hypomagnesemia: Hypokalemia / Hypomagnesemia in the setting of electrolyte derangement. Replaced.  Continue to monitor  ADHD: Hold Adderall in the setting of tachycardia with frequent PVC and torsade the point.   Generalized anxiety disorder Hold Prozac  and Abilify  the setting of prolonged QTc.  Continue BuSpar .   History of chronic marijuana use: History of chronic alcohol use: Patient denies alcohol use.  Continue thiamine  and folic acid .  Checking blood alcohol level.  Continue CIWA protocol is unreliable historian.   History of asthma: Continue DuoNeb as needed.    DVT prophylaxis: Lovenox  Code Status: Full code Family Communication: No family at bed side. Disposition Plan:    Status is: Inpatient Remains inpatient appropriate because: Admitted for intractable nausea and vomiting and found to have episode of torsades.  Cardiology consulted. Patient not medically ready for discharge.  Consultants:  Cardiology  Procedures: Echo  Antimicrobials:  Anti-infectives (From admission, onward)    None      Subjective: Patient was seen  and examined at bedside.  Overnight events noted. Patient reports nausea has improved,  Vomiting has improved.  Objective: Vitals:   04/25/24 1900 04/25/24 2024 04/26/24 0526 04/26/24 0841  BP: 118/64 130/76 120/71 (!) 161/95  Pulse:  88 89 90  Resp: 17 20 18 18   Temp:  98.3 F (36.8 C) 98.3 F (36.8 C) 98 F (36.7 C)  TempSrc:      SpO2:  100% 99% 100%  Weight:      Height:        Intake/Output Summary (Last 24 hours) at 04/26/2024 1436 Last data filed at 04/26/2024 0300 Gross per 24 hour  Intake 280.38 ml  Output 0 ml  Net 280.38 ml   Filed Weights   04/25/24 0901 04/25/24 0903  Weight: 86.2 kg 86.2 kg    Examination:  General exam: Appears calm and comfortable, not in any acute distress. Respiratory system: CTA Bilaterally . Respiratory effort normal.  RR 14 Cardiovascular system: S1 & S2 heard, RRR. No JVD, murmurs, rubs, gallops or clicks.  Gastrointestinal system: Abdomen is nondistended, soft and nontender.  Normal bowel sounds heard. Central nervous system: Alert and oriented x 3. No focal neurological deficits. Extremities: No edema, no cyanosis, no clubbing Skin: No rashes, lesions or ulcers Psychiatry: Judgement and insight appear normal. Mood & affect appropriate.     Data Reviewed: I have personally reviewed following labs and imaging studies  CBC: Recent Labs  Lab 04/25/24 0920 04/26/24 0342  WBC 16.8* 13.9*  NEUTROABS 15.4*  --   HGB 13.6 12.4  HCT 38.2 34.9*  MCV 84.7 85.7  PLT 339 274   Basic Metabolic Panel: Recent Labs  Lab 04/25/24 0920 04/25/24 0944 04/25/24 2134 04/26/24 0342  NA 144  --  143 139  K 2.9*  --  3.2* 2.8*  CL 98  --  102 99  CO2 23  --  25 25  GLUCOSE 137*  --  98 113*  BUN 9  --  8 7  CREATININE 0.74  --  0.64 0.63  CALCIUM 10.6*  --  8.7* 8.6*  MG  --  1.1* 2.2  --   PHOS  --   --  2.4*  --    GFR: Estimated Creatinine Clearance: 100.3 mL/min (by C-G formula based on SCr of 0.63 mg/dL). Liver Function  Tests: Recent Labs  Lab 04/25/24 0920 04/26/24 0342  AST 23 21  ALT 15 12  ALKPHOS 65 52  BILITOT 0.9 0.7  PROT 8.2* 6.9  ALBUMIN 5.1* 4.4   Recent Labs  Lab 04/25/24 0920  LIPASE 16   No results for input(s): AMMONIA in the last 168 hours. Coagulation Profile: No results for input(s): INR, PROTIME in the last 168 hours. Cardiac Enzymes: No results for input(s): CKTOTAL, CKMB, CKMBINDEX, TROPONINI in the last 168 hours. BNP (last 3 results) No results for input(s): PROBNP in the last 8760 hours. HbA1C: No results for input(s): HGBA1C in the last 72 hours. CBG: No results for input(s): GLUCAP in the last 168 hours. Lipid Profile: No results for input(s): CHOL, HDL, LDLCALC, TRIG, CHOLHDL, LDLDIRECT in the last 72 hours. Thyroid  Function Tests: Recent Labs    04/25/24 2134  TSH 1.250  FREET4 1.32   Anemia Panel: No results for input(s): VITAMINB12, FOLATE, FERRITIN, TIBC, IRON, RETICCTPCT in the last 72 hours.  Sepsis Labs: No results for input(s): PROCALCITON, LATICACIDVEN in the last 168 hours.  Recent Results (from the past 240 hours)  Resp panel by RT-PCR (RSV, Flu A&B, Covid) Anterior Nasal Swab     Status: None   Collection Time: 04/25/24  9:22 AM   Specimen: Anterior Nasal Swab  Result Value Ref Range Status   SARS Coronavirus 2 by RT PCR NEGATIVE NEGATIVE Final    Comment: (NOTE) SARS-CoV-2 target nucleic acids are NOT DETECTED.  The SARS-CoV-2 RNA is generally detectable in upper respiratory specimens during the acute phase of infection. The lowest concentration of SARS-CoV-2 viral copies this assay can detect is 138 copies/mL. A negative result does not preclude SARS-Cov-2 infection and should not be used as the sole basis for treatment or other patient management decisions. A negative result may occur with  improper specimen collection/handling, submission of specimen other than nasopharyngeal swab,  presence of viral mutation(s) within the areas targeted by this assay, and inadequate number of viral copies(<138 copies/mL). A negative result must be combined with clinical observations, patient history, and epidemiological information. The expected result is Negative.  Fact Sheet for Patients:  bloggercourse.com  Fact Sheet for Healthcare Providers:  seriousbroker.it  This test is no t yet approved or cleared by the [place]  FDA and  has been authorized for detection and/or diagnosis of SARS-CoV-2 by FDA under an Emergency Use Authorization (EUA). This EUA will remain  in effect (meaning this test can be used) for the duration of the COVID-19 declaration under Section 564(b)(1) of the Act, 21 U.S.C.section 360bbb-3(b)(1), unless the authorization is terminated  or revoked sooner.       Influenza A by PCR NEGATIVE NEGATIVE Final   Influenza B by PCR NEGATIVE NEGATIVE Final    Comment: (NOTE) The Xpert Xpress SARS-CoV-2/FLU/RSV plus assay is intended as an aid in the diagnosis of influenza from Nasopharyngeal swab specimens and should not be used as a sole basis for treatment. Nasal washings and aspirates are unacceptable for Xpert Xpress SARS-CoV-2/FLU/RSV testing.  Fact Sheet for Patients: bloggercourse.com  Fact Sheet for Healthcare Providers: seriousbroker.it  This test is not yet approved or cleared by the [place]  FDA and has been authorized for detection and/or diagnosis of SARS-CoV-2 by FDA under an Emergency Use Authorization (EUA). This EUA will remain in effect (meaning this test can be used) for the duration of the COVID-19 declaration under Section 564(b)(1) of the Act, 21 U.S.C. section 360bbb-3(b)(1), unless the authorization is terminated or revoked.     Resp Syncytial Virus by PCR NEGATIVE NEGATIVE Final    Comment: (NOTE) Fact Sheet for  Patients: bloggercourse.com  Fact Sheet for Healthcare Providers: seriousbroker.it  This test is not yet approved or cleared by the [place]  FDA and has been authorized for detection and/or diagnosis of SARS-CoV-2 by FDA under an Emergency Use Authorization (EUA). This EUA will remain in effect (meaning this test can be used) for the duration of the COVID-19 declaration under Section 564(b)(1) of the Act, 21 U.S.C. section 360bbb-3(b)(1), unless the authorization is terminated or revoked.  Performed at [facility], [address] [place], [place] [address]     Radiology Studies: ECHOCARDIOGRAM COMPLETE Result Date: 04/26/2024    ECHOCARDIOGRAM REPORT   Patient Name:   HERMENA SWINT Date of Exam: 04/26/2024 Medical Rec #:  [id number]   Height:       64.0 in Accession #:    [id number]  Weight:       190.0 lb  Date of Birth:  [birth year]   BSA:          1.914 m Patient Age:    38 years    BP:           120/71 mmHg Patient Gender: F           HR:           75 bpm. Exam Location:  Inpatient Procedure: 2D Echo, Cardiac Doppler and Color Doppler (Both Spectral and Color            Flow Doppler were utilized during procedure). Indications:    Abnormal ECG R94.31  History:        Patient has no prior history of Echocardiogram examinations.  Sonographer:    Tinnie Gosling RDCS Referring Phys: [id number] HUSAM M [facility] IMPRESSIONS  1. Left ventricular ejection fraction, by estimation, is 60 to 65%. The left ventricle has normal function. The left ventricle has no regional wall motion abnormalities. Left ventricular diastolic parameters were normal.  2. Right ventricular systolic function is normal. The right ventricular size is normal.  3. The mitral valve is abnormal. Trivial mitral valve regurgitation. No evidence of mitral stenosis.  4. The aortic valve is normal in structure. Aortic valve regurgitation is not visualized. No aortic stenosis  is present.  5. The inferior vena cava is normal in size with greater than 50% respiratory variability, suggesting right atrial pressure of 3 mmHg. FINDINGS  Left Ventricle: Left ventricular ejection fraction, by estimation, is 60 to 65%. The left ventricle has normal function. The left ventricle has no regional wall motion abnormalities. Strain was performed and the global longitudinal strain is indeterminate. The left ventricular internal cavity size was normal in size. There is no left ventricular hypertrophy. Left ventricular diastolic parameters were normal. Right Ventricle: The right ventricular size is normal. No increase in right ventricular wall thickness. Right ventricular systolic function is normal. Left Atrium: Left atrial size was normal in size. Right Atrium: Right atrial size was normal in size. Pericardium: There is no evidence of pericardial effusion. Mitral Valve: The mitral valve is abnormal. There is mild thickening of the mitral valve leaflet(s). There is mild calcification of the mitral valve leaflet(s). Trivial mitral valve regurgitation. No evidence of mitral valve stenosis. Tricuspid Valve: The tricuspid valve is normal in structure. Tricuspid valve regurgitation is trivial. No evidence of tricuspid stenosis. Aortic Valve: The aortic valve is normal in structure. Aortic valve regurgitation is not visualized. No aortic stenosis is present. Pulmonic Valve: The pulmonic valve was normal in structure. Pulmonic valve regurgitation is not visualized. No evidence of pulmonic stenosis. Aorta: The aortic root is normal in size and structure. Venous: The inferior vena cava is normal in size with greater than 50% respiratory variability, suggesting right atrial pressure of 3 mmHg. IAS/Shunts: No atrial level shunt detected by color flow Doppler. Additional Comments: 3D was performed not requiring image post processing on an independent workstation and was indeterminate.  LEFT VENTRICLE PLAX 2D LVIDd:          4.70 cm      Diastology LVIDs:         2.90 cm      LV e' medial:    11.50 cm/s LV PW:         0.90 cm      LV E/e' medial:  7.9 LV IVS:        0.90 cm      LV e' lateral:   17.00 cm/s  LVOT diam:     2.10 cm      LV E/e' lateral: 5.3 LV SV:         82 LV SV Index:   43 LVOT Area:     3.46 cm  LV Volumes (MOD) LV vol d, MOD A2C: 117.0 ml LV vol d, MOD A4C: 105.0 ml LV vol s, MOD A2C: 32.7 ml LV vol s, MOD A4C: 41.4 ml LV SV MOD A2C:     84.3 ml LV SV MOD A4C:     105.0 ml LV SV MOD BP:      80.1 ml RIGHT VENTRICLE             IVC RV S prime:     17.10 cm/s  IVC diam: 2.00 cm TAPSE (M-mode): 2.5 cm                             PULMONARY VEINS                             Diastolic Velocity: 22.80 cm/s                             S/D Velocity:       1.30                             Systolic Velocity:  29.60 cm/s LEFT ATRIUM             Index        RIGHT ATRIUM           Index LA diam:        3.50 cm 1.83 cm/m   RA Area:     12.00 cm LA Vol (A2C):   56.1 ml 29.31 ml/m  RA Volume:   24.00 ml  12.54 ml/m LA Vol (A4C):   42.7 ml 22.31 ml/m LA Biplane Vol: 52.3 ml 27.32 ml/m  AORTIC VALVE LVOT Vmax:   118.00 cm/s LVOT Vmean:  76.900 cm/s LVOT VTI:    0.238 m  AORTA Ao Root diam: 2.50 cm Ao Asc diam:  3.00 cm MITRAL VALVE MV Area (PHT): 4.21 cm    SHUNTS MV Decel Time: 180 msec    Systemic VTI:  0.24 m MV E velocity: 90.70 cm/s  Systemic Diam: 2.10 cm MV A velocity: 81.80 cm/s MV E/A ratio:  1.11 Maude Emmer MD Electronically signed by Maude Emmer MD Signature Date/Time: 04/26/2024/10:53:29 AM    Final    Scheduled Meds:  busPIRone   15 mg Oral BID   docusate sodium   100 mg Oral BID   enoxaparin  (LOVENOX ) injection  40 mg Subcutaneous Q24H   folic acid   1 mg Oral q AM   pantoprazole   40 mg Oral q AM   scopolamine   1 patch Transdermal Q72H   sodium chloride  flush  3 mL Intravenous Q12H   sodium chloride  flush  3 mL Intravenous Q12H   thiamine  (VITAMIN B1) injection  100 mg Intravenous Daily    Continuous Infusions:  sodium chloride        LOS: 0 days    Time spent: 50 mins    Darcel Dawley, MD [facility]   If 7PM-7AM, please contact night-coverage  "

## 2024-04-27 DIAGNOSIS — R1115 Cyclical vomiting syndrome unrelated to migraine: Secondary | ICD-10-CM | POA: Diagnosis not present

## 2024-04-27 LAB — BASIC METABOLIC PANEL WITH GFR
Anion gap: 17 — ABNORMAL HIGH (ref 5–15)
BUN: 7 mg/dL (ref 6–20)
CO2: 27 mmol/L (ref 22–32)
Calcium: 9.3 mg/dL (ref 8.9–10.3)
Chloride: 94 mmol/L — ABNORMAL LOW (ref 98–111)
Creatinine, Ser: 0.58 mg/dL (ref 0.44–1.00)
GFR, Estimated: >60 mL/min
Glucose, Bld: 84 mg/dL (ref 70–99)
Potassium: 2.7 mmol/L — CL (ref 3.5–5.1)
Sodium: 138 mmol/L (ref 135–145)

## 2024-04-27 MED ORDER — THIAMINE MONONITRATE 100 MG PO TABS
100.0000 mg | ORAL_TABLET | Freq: Every day | ORAL | Status: DC
  Administered 2024-04-27 – 2024-05-01 (×5): 100 mg via ORAL
  Filled 2024-04-27 (×5): qty 1

## 2024-04-27 MED ORDER — POTASSIUM CHLORIDE 20 MEQ PO PACK
40.0000 meq | PACK | Freq: Once | ORAL | Status: DC
  Filled 2024-04-27: qty 2

## 2024-04-27 MED ORDER — POTASSIUM CHLORIDE 10 MEQ/100ML IV SOLN
10.0000 meq | INTRAVENOUS | Status: AC
  Administered 2024-04-27 (×4): 10 meq via INTRAVENOUS
  Filled 2024-04-27 (×4): qty 100

## 2024-04-27 MED ORDER — POTASSIUM CHLORIDE CRYS ER 20 MEQ PO TBCR
40.0000 meq | EXTENDED_RELEASE_TABLET | Freq: Once | ORAL | Status: AC
  Administered 2024-04-27: 40 meq via ORAL
  Filled 2024-04-27: qty 2

## 2024-04-27 NOTE — Progress Notes (Addendum)
 "  Rounding Note   Patient Name: Angela Dixon Date of Encounter: 04/27/2024  [facility] Cardiologist: Clayson Riling K Jantzen Pilger, MD   Subjective She states she has not had any recurrent vomiting in the last couple of hours. No chest pain or shortness of breath. No significant palpitations, lightheadedness, dizziness.  Scheduled Meds:  busPIRone   15 mg Oral BID   docusate sodium   100 mg Oral BID   enoxaparin  (LOVENOX ) injection  40 mg Subcutaneous Q24H   folic acid   1 mg Oral q AM   pantoprazole   40 mg Oral q AM   scopolamine   1 patch Transdermal Q72H   sodium chloride  flush  3 mL Intravenous Q12H   sodium chloride  flush  3 mL Intravenous Q12H   thiamine   100 mg Oral Daily   Continuous Infusions:  PRN Meds: acetaminophen  **OR** acetaminophen , alum & mag hydroxide-simeth, ipratropium-albuterol , metoCLOPramide  (REGLAN ) injection, sodium chloride  flush   Vital Signs  Vitals:   04/26/24 2129 04/27/24 0425 04/27/24 0914 04/27/24 0932  BP: 135/79 (!) 158/89 117/79   Pulse: 77 76 69   Resp: 19 19  20   Temp: 98.7 F (37.1 C) 98.4 F (36.9 C) 98.2 F (36.8 C)   TempSrc: Oral Oral Oral   SpO2: 100% 100% 100%   Weight:      Height:        Intake/Output Summary (Last 24 hours) at 04/27/2024 1059 Last data filed at 04/27/2024 0500 Gross per 24 hour  Intake --  Output 700 ml  Net -700 ml      04/25/2024    9:03 AM 04/25/2024    9:01 AM 01/07/2024    8:07 AM  Last 3 Weights  Weight (lbs) 190 lb 190 lb 178 lb 9.6 oz  Weight (kg) 86.183 kg 86.183 kg 81.012 kg      Telemetry Normal sinus rhythm with rates in the 70s to 90s. - Personally Reviewed  ECG  No new ECG tracing today. - Personally Reviewed  Physical Exam  GEN: No acute distress.   Neck: No JVD. Cardiac: RRR. No murmurs, rubs, or gallops.  Respiratory: Clear to auscultation bilaterally. No wheezes, rhonchi, or rales. MS: No lower extremity edema. Neuro:  No focal deficits.  Psych: Normal affect. Responds  appropriately.  Labs High Sensitivity Troponin:  No results for input(s): TROPONINIHS in the last 720 hours. No results for input(s): TRNPT in the last 720 hours.     Chemistry Recent Labs  Lab 04/25/24 0920 04/25/24 0944 04/25/24 2134 04/26/24 0342  NA 144  --  143 139  K 2.9*  --  3.2* 2.8*  CL 98  --  102 99  CO2 23  --  25 25  GLUCOSE 137*  --  98 113*  BUN 9  --  8 7  CREATININE 0.74  --  0.64 0.63  CALCIUM 10.6*  --  8.7* 8.6*  MG  --  1.1* 2.2  --   PROT 8.2*  --   --  6.9  ALBUMIN 5.1*  --   --  4.4  AST 23  --   --  21  ALT 15  --   --  12  ALKPHOS 65  --   --  52  BILITOT 0.9  --   --  0.7  GFRNONAA >60  --  >60 >60  ANIONGAP 22*  --  15 15    Lipids No results for input(s): CHOL, TRIG, HDL, LABVLDL, LDLCALC, CHOLHDL in the last 168  hours.  Hematology Recent Labs  Lab 04/25/24 0920 04/26/24 0342  WBC 16.8* 13.9*  RBC 4.51 4.07  HGB 13.6 12.4  HCT 38.2 34.9*  MCV 84.7 85.7  MCH 30.2 30.5  MCHC 35.6 35.5  RDW 13.4 13.5  PLT 339 274   Thyroid   Recent Labs  Lab 04/25/24 2134  TSH 1.250  FREET4 1.32    BNPNo results for input(s): BNP, PROBNP in the last 168 hours.  DDimer No results for input(s): DDIMER in the last 168 hours.   Radiology  ECHOCARDIOGRAM COMPLETE Result Date: 04/26/2024    ECHOCARDIOGRAM REPORT   Patient Name:   Angela Dixon Date of Exam: 04/26/2024 Medical Rec #:  [id number]   Height:       64.0 in Accession #:    [id number]  Weight:       190.0 lb Date of Birth:  02/08/1986   BSA:          1.914 m Patient Age:    38 years    BP:           120/71 mmHg Patient Gender: F           HR:           75 bpm. Exam Location:  Inpatient Procedure: 2D Echo, Cardiac Doppler and Color Doppler (Both Spectral and Color            Flow Doppler were utilized during procedure). Indications:    Abnormal ECG R94.31  History:        Patient has no prior history of Echocardiogram examinations.  Sonographer:    Tinnie Gosling RDCS Referring  Phys: [id number] HUSAM M [facility] IMPRESSIONS  1. Left ventricular ejection fraction, by estimation, is 60 to 65%. The left ventricle has normal function. The left ventricle has no regional wall motion abnormalities. Left ventricular diastolic parameters were normal.  2. Right ventricular systolic function is normal. The right ventricular size is normal.  3. The mitral valve is abnormal. Trivial mitral valve regurgitation. No evidence of mitral stenosis.  4. The aortic valve is normal in structure. Aortic valve regurgitation is not visualized. No aortic stenosis is present.  5. The inferior vena cava is normal in size with greater than 50% respiratory variability, suggesting right atrial pressure of 3 mmHg. FINDINGS  Left Ventricle: Left ventricular ejection fraction, by estimation, is 60 to 65%. The left ventricle has normal function. The left ventricle has no regional wall motion abnormalities. Strain was performed and the global longitudinal strain is indeterminate. The left ventricular internal cavity size was normal in size. There is no left ventricular hypertrophy. Left ventricular diastolic parameters were normal. Right Ventricle: The right ventricular size is normal. No increase in right ventricular wall thickness. Right ventricular systolic function is normal. Left Atrium: Left atrial size was normal in size. Right Atrium: Right atrial size was normal in size. Pericardium: There is no evidence of pericardial effusion. Mitral Valve: The mitral valve is abnormal. There is mild thickening of the mitral valve leaflet(s). There is mild calcification of the mitral valve leaflet(s). Trivial mitral valve regurgitation. No evidence of mitral valve stenosis. Tricuspid Valve: The tricuspid valve is normal in structure. Tricuspid valve regurgitation is trivial. No evidence of tricuspid stenosis. Aortic Valve: The aortic valve is normal in structure. Aortic valve regurgitation is not visualized. No aortic stenosis is present.  Pulmonic Valve: The pulmonic valve was normal in structure. Pulmonic valve regurgitation is not visualized. No evidence of pulmonic stenosis. Aorta: The aortic  root is normal in size and structure. Venous: The inferior vena cava is normal in size with greater than 50% respiratory variability, suggesting right atrial pressure of 3 mmHg. IAS/Shunts: No atrial level shunt detected by color flow Doppler. Additional Comments: 3D was performed not requiring image post processing on an independent workstation and was indeterminate.  LEFT VENTRICLE PLAX 2D LVIDd:         4.70 cm      Diastology LVIDs:         2.90 cm      LV e' medial:    11.50 cm/s LV PW:         0.90 cm      LV E/e' medial:  7.9 LV IVS:        0.90 cm      LV e' lateral:   17.00 cm/s LVOT diam:     2.10 cm      LV E/e' lateral: 5.3 LV SV:         82 LV SV Index:   43 LVOT Area:     3.46 cm  LV Volumes (MOD) LV vol d, MOD A2C: 117.0 ml LV vol d, MOD A4C: 105.0 ml LV vol s, MOD A2C: 32.7 ml LV vol s, MOD A4C: 41.4 ml LV SV MOD A2C:     84.3 ml LV SV MOD A4C:     105.0 ml LV SV MOD BP:      80.1 ml RIGHT VENTRICLE             IVC RV S prime:     17.10 cm/s  IVC diam: 2.00 cm TAPSE (M-mode): 2.5 cm                             PULMONARY VEINS                             Diastolic Velocity: 22.80 cm/s                             S/D Velocity:       1.30                             Systolic Velocity:  29.60 cm/s LEFT ATRIUM             Index        RIGHT ATRIUM           Index LA diam:        3.50 cm 1.83 cm/m   RA Area:     12.00 cm LA Vol (A2C):   56.1 ml 29.31 ml/m  RA Volume:   24.00 ml  12.54 ml/m LA Vol (A4C):   42.7 ml 22.31 ml/m LA Biplane Vol: 52.3 ml 27.32 ml/m  AORTIC VALVE LVOT Vmax:   118.00 cm/s LVOT Vmean:  76.900 cm/s LVOT VTI:    0.238 m  AORTA Ao Root diam: 2.50 cm Ao Asc diam:  3.00 cm MITRAL VALVE MV Area (PHT): 4.21 cm    SHUNTS MV Decel Time: 180 msec    Systemic VTI:  0.24 m MV E velocity: 90.70 cm/s  Systemic Diam: 2.10 cm MV A  velocity: 81.80 cm/s MV E/A ratio:  1.11 Maude Emmer MD Electronically signed by Maude Emmer MD Signature Date/Time:  04/26/2024/10:53:29 AM    Final     Cardiac Studies  Echocardiogram 04/26/2024: Impressions:  1. Left ventricular ejection fraction, by estimation, is 60 to 65%. The  left ventricle has normal function. The left ventricle has no regional  wall motion abnormalities. Left ventricular diastolic parameters were  normal.   2. Right ventricular systolic function is normal. The right ventricular  size is normal.   3. The mitral valve is abnormal. Trivial mitral valve regurgitation. No  evidence of mitral stenosis.   4. The aortic valve is normal in structure. Aortic valve regurgitation is  not visualized. No aortic stenosis is present.   5. The inferior vena cava is normal in size with greater than 50%  respiratory variability, suggesting right atrial pressure of 3 mmHg.   Patient Profile   39 y.o. female with a history of hyperlipidemia, GERD, IBS, anxiety/ depression, ADHD, obesity, alcohol abuse, and marijauna abuse with cannabis hyperemesis syndrome who was admitted on 04/25/2024 for prolonged QTc and Torsades in setting of severe electrolyte abnormalities due to possible cannabis hyperemesis.    Assessment & Plan   Prolonged Qtc Torsades [place] Patient presented with nausea and frequent vomiting and was found to have hypokalemia with potassium of 2.9 and hypomagnesemia with magnesium  of 1.1. This was complicated by prolonged QTc and brief episodes of Torsades while at the [facility] ED (although unable to see rhythm strips). Electrolytes were repleted and Prozac  was held. Echo showed LVEF of 60-65% with normal wall motion, normal RV function, and no significant valvular disease. Magnesium  had improved to 2.2 after supplementation but potassium was still low at 2.8 yesterday. EKG today shows QTc is still prolonged around . Today's BMET still pending. Of note, QTc was  also prolonged in 12/2023 and 02/2024 but this was also in the setting of emesis and electrolyte abnormalities. Prior EKG in 2021 showed normal QTc.  [place] this is due to electrolytes abnormalities. Continue aggressive electrolyte supplementation. Recommend repeating EKG once electrolytes have normalized. Also recommend avoiding QTc prolonging medications (Adderall, Prozac , and Abilify have been stopped). Agree with Scopolamine  and Tigan  for nausea/ vomiting.  Otherwise, management per primary team: - Intractable nausea/ vomiting - Cannabis hyperemesis syndrome - Anxiety/ depression - ADHD - IBS - GERD - Asthma - Hyperlipidemia  For questions or updates, please contact [facility] Please consult [email or web address] for contact info under       Signed, Callie E Goodrich, PA-C  04/27/2024, 10:59 AM    ATTENDING ATTESTATION:  After conducting a review of all available clinical information with the care team, interviewing the patient, and performing a physical exam, I agree with the findings and plan described in this note with adjustments as indicated below which were discussed and enacted by staff above.   GEN: No acute distress, AO x 3 HEENT:  MMM, no JVD, no scleral icterus Cardiac: RRR, no murmurs, rubs, or gallops.  Respiratory: Clear to auscultation bilaterally. GI: Soft, nontender, non-distended  MS: No edema; No deformity. Neuro:  Nonfocal  Vasc:  +2 radial pulses  Patient's frequency of nausea and emesis is improving.  Patient's potassium level still remains extremely low.  EKG with prolonged QTC.  No events on telemetry.  Echocardiogram reassuring.  Continue aggressive potassium replacement and avoid QT prolonging agents.  Darnell Jeschke, MD Pager [phone]  "

## 2024-04-27 NOTE — Progress Notes (Signed)
 Lab called for a Critical K level of 2.7. MD will be notified. Sherline Jubilee

## 2024-04-27 NOTE — Progress Notes (Signed)
 " PROGRESS NOTE    Angela Dixon  FMW:[id number] DOB: 03/17/1986 DOA: 04/25/2024 PCP: Billy Philippe SAUNDERS, NP   Brief Narrative:  This 39 years old female with PMH significant of hyperlipidemia, prolonged QT interval, GERD,  ADHD, anxiety, depression, obesity, IBS, chronic alcohol use, and cannabis use presented with complaints of intractable nausea and vomiting.  Patient is hemodynamically stable.  EKG shows prolonged QTc at 591.  Labs significant for WBC 16.8, potassium 2.9, calcium 10.6, magnesium  1.1.  Patient was given electrolyte replacement and IV hydration.  Respiratory viral panel negative.  UDS positive for benzos, amphetamine and THC.  While in the ED patient had a brief episode of sinus tachycardia with frequent PVCs and a brief run of appeared to be torsades.  Patient has received magnesium  sulfate and heart rhythm stabilized.  Patient was admitted for further evaluation and started on scopolamine  patch for nausea and vomiting.  Assessment & Plan:   Principal Problem:   Cyclical vomiting syndrome Active Problems:   Intractable nausea and vomiting   Frequent PVCs   GERD (gastroesophageal reflux disease)   IBS (irritable bowel syndrome)   Chronic alcohol abuse   ADHD   Prolonged QT interval   Hypokalemia   Hypomagnesemia   Leukocytosis   Marijuana use   Torsades de pointes (HCC)  Cyclical vomiting syndrome in the setting of marijuana use: Intractable Nausea / Vomiting: Patient presented in the ED with nausea and vomiting, There was concern for flu exposure.   Patient reported nonstop vomiting for 24 hours.  Denies any abdominal pain, diarrhea, fever and chills. Labs significant for WBC 16.8, potassium 2.9, calcium 10.6, low magnesium  1.1.   Respiratory panel negative for COVID, RSV and flu. UDS positive with benzodiazepine, amphetamine and THC. Patient received electrolyte replacement, antinausea medications. Patient has a brief episode of sinus tachycardia with frequent  PVC and brief run of appeared to be torsade.   Patient heart rhythm stabilized after receiving 1 g of magnesium  sulfate.   EKG has been obtained total 2 times which showed normal sinus rhythm,  heart rate 96 and prolonged QTc 591 ms. While in the ED patient did not had any episodes of nausea / vomiting.   Continue Protonix  daily, scopolamine  patch daily ,  Tigan  hasn't worked, started on Reglan  q 8 hrs prn. Counseled patient for marijuana smoking cessation. She reports feeling much better, Nausea and vomiting improving.   History of frequent PVC: Torsade the pointes - resolved. Prolonged QTC Likely In the setting of electrolyte derangement from intractable nausea, vomiting and polypharmacy due to Adderall, Prozac , Abilify and marijuana use. Need to avoid Adderall, Prozac , Abilify and marijuana. Need to keep mag level above 2 potassium level above 4. Cardiology has been consulted and on board. TTE completed and appears reassuring.  Hypokalemia / Hypomagnesemia: Hypokalemia / Hypomagnesemia in the setting of electrolyte derangement. Replaced.  Continue to monitor  ADHD: Hold Adderall in the setting of tachycardia with frequent PVC and torsade the point.   Generalized anxiety disorder Hold Prozac  and Abilify  the setting of prolonged QTc.  Continue BuSpar .   History of chronic marijuana use: History of chronic alcohol use: Patient denies alcohol use.  Continue thiamine  and folic acid .  Checking blood alcohol level.  Continue CIWA protocol is unreliable historian.   History of asthma: Continue DuoNeb as needed.    DVT prophylaxis: Lovenox  Code Status: Full code Family Communication: No family at bed side. Disposition Plan:    Status is: Inpatient Remains inpatient appropriate because: Admitted for  intractable nausea and vomiting and found to have episode of torsades.  Cardiology consulted. Patient not medically ready for discharge.    Consultants:   Cardiology  Procedures: Echo  Antimicrobials:  Anti-infectives (From admission, onward)    None      Subjective: Patient was seen and examined at bedside.  Overnight events noted. Patient reports nausea has improved,  Vomiting has improved. She has not tolerated soft food, reports throwing up. Diet changed to full liquid diet.   Objective: Vitals:   04/26/24 2129 04/27/24 0425 04/27/24 0914 04/27/24 0932  BP: 135/79 (!) 158/89 117/79   Pulse: 77 76 69   Resp: 19 19  20   Temp: 98.7 F (37.1 C) 98.4 F (36.9 C) 98.2 F (36.8 C)   TempSrc: Oral Oral Oral   SpO2: 100% 100% 100%   Weight:      Height:        Intake/Output Summary (Last 24 hours) at 04/27/2024 1349 Last data filed at 04/27/2024 0500 Gross per 24 hour  Intake --  Output 700 ml  Net -700 ml   Filed Weights   04/25/24 0901 04/25/24 0903  Weight: 86.2 kg 86.2 kg    Examination:  General exam: Appears calm and comfortable, not in any acute distress. Respiratory system: CTA Bilaterally . Respiratory effort normal.  RR 15 Cardiovascular system: S1 & S2 heard, RRR. No JVD, murmurs, rubs, gallops or clicks.  Gastrointestinal system: Abdomen is nondistended, soft and nontender.  Normal bowel sounds heard. Central nervous system: Alert and oriented x 3. No focal neurological deficits. Extremities: No edema, no cyanosis, no clubbing Skin: No rashes, lesions or ulcers Psychiatry: Judgement and insight appear normal. Mood & affect appropriate.     Data Reviewed: I have personally reviewed following labs and imaging studies  CBC: Recent Labs  Lab 04/25/24 0920 04/26/24 0342  WBC 16.8* 13.9*  NEUTROABS 15.4*  --   HGB 13.6 12.4  HCT 38.2 34.9*  MCV 84.7 85.7  PLT 339 274   Basic Metabolic Panel: Recent Labs  Lab 04/25/24 0920 04/25/24 0944 04/25/24 2134 04/26/24 0342 04/27/24 1005  NA 144  --  143 139 138  K 2.9*  --  3.2* 2.8* 2.7*  CL 98  --  102 99 94*  CO2 23  --  25 25 27   GLUCOSE  137*  --  98 113* 84  BUN 9  --  8 7 7   CREATININE 0.74  --  0.64 0.63 0.58  CALCIUM 10.6*  --  8.7* 8.6* 9.3  MG  --  1.1* 2.2  --   --   PHOS  --   --  2.4*  --   --    GFR: Estimated Creatinine Clearance: 100.3 mL/min (by C-G formula based on SCr of 0.58 mg/dL). Liver Function Tests: Recent Labs  Lab 04/25/24 0920 04/26/24 0342  AST 23 21  ALT 15 12  ALKPHOS 65 52  BILITOT 0.9 0.7  PROT 8.2* 6.9  ALBUMIN 5.1* 4.4   Recent Labs  Lab 04/25/24 0920  LIPASE 16   No results for input(s): AMMONIA in the last 168 hours. Coagulation Profile: No results for input(s): INR, PROTIME in the last 168 hours. Cardiac Enzymes: No results for input(s): CKTOTAL, CKMB, CKMBINDEX, TROPONINI in the last 168 hours. BNP (last 3 results) No results for input(s): PROBNP in the last 8760 hours. HbA1C: No results for input(s): HGBA1C in the last 72 hours. CBG: No results for input(s): GLUCAP in the last  168 hours. Lipid Profile: No results for input(s): CHOL, HDL, LDLCALC, TRIG, CHOLHDL, LDLDIRECT in the last 72 hours. Thyroid  Function Tests: Recent Labs    04/25/24 2134  TSH 1.250  FREET4 1.32   Anemia Panel: No results for input(s): VITAMINB12, FOLATE, FERRITIN, TIBC, IRON, RETICCTPCT in the last 72 hours. Sepsis Labs: No results for input(s): PROCALCITON, LATICACIDVEN in the last 168 hours.  Recent Results (from the past 240 hours)  Resp panel by RT-PCR (RSV, Flu A&B, Covid) Anterior Nasal Swab     Status: None   Collection Time: 04/25/24  9:22 AM   Specimen: Anterior Nasal Swab  Result Value Ref Range Status   SARS Coronavirus 2 by RT PCR NEGATIVE NEGATIVE Final    Comment: (NOTE) SARS-CoV-2 target nucleic acids are NOT DETECTED.  The SARS-CoV-2 RNA is generally detectable in upper respiratory specimens during the acute phase of infection. The lowest concentration of SARS-CoV-2 viral copies this assay can detect is 138  copies/mL. A negative result does not preclude SARS-Cov-2 infection and should not be used as the sole basis for treatment or other patient management decisions. A negative result may occur with  improper specimen collection/handling, submission of specimen other than nasopharyngeal swab, presence of viral mutation(s) within the areas targeted by this assay, and inadequate number of viral copies(<138 copies/mL). A negative result must be combined with clinical observations, patient history, and epidemiological information. The expected result is Negative.  Fact Sheet for Patients:  bloggercourse.com  Fact Sheet for Healthcare Providers:  seriousbroker.it  This test is no t yet approved or cleared by the [place]  FDA and  has been authorized for detection and/or diagnosis of SARS-CoV-2 by FDA under an Emergency Use Authorization (EUA). This EUA will remain  in effect (meaning this test can be used) for the duration of the COVID-19 declaration under Section 564(b)(1) of the Act, 21 U.S.C.section 360bbb-3(b)(1), unless the authorization is terminated  or revoked sooner.       Influenza A by PCR NEGATIVE NEGATIVE Final   Influenza B by PCR NEGATIVE NEGATIVE Final    Comment: (NOTE) The Xpert Xpress SARS-CoV-2/FLU/RSV plus assay is intended as an aid in the diagnosis of influenza from Nasopharyngeal swab specimens and should not be used as a sole basis for treatment. Nasal washings and aspirates are unacceptable for Xpert Xpress SARS-CoV-2/FLU/RSV testing.  Fact Sheet for Patients: bloggercourse.com  Fact Sheet for Healthcare Providers: seriousbroker.it  This test is not yet approved or cleared by the [place]  FDA and has been authorized for detection and/or diagnosis of SARS-CoV-2 by FDA under an Emergency Use Authorization (EUA). This EUA will remain in effect (meaning  this test can be used) for the duration of the COVID-19 declaration under Section 564(b)(1) of the Act, 21 U.S.C. section 360bbb-3(b)(1), unless the authorization is terminated or revoked.     Resp Syncytial Virus by PCR NEGATIVE NEGATIVE Final    Comment: (NOTE) Fact Sheet for Patients: bloggercourse.com  Fact Sheet for Healthcare Providers: seriousbroker.it  This test is not yet approved or cleared by the [place]  FDA and has been authorized for detection and/or diagnosis of SARS-CoV-2 by FDA under an Emergency Use Authorization (EUA). This EUA will remain in effect (meaning this test can be used) for the duration of the COVID-19 declaration under Section 564(b)(1) of the Act, 21 U.S.C. section 360bbb-3(b)(1), unless the authorization is terminated or revoked.  Performed at [facility], [address], [place], [place] [address]     Radiology Studies:  ECHOCARDIOGRAM COMPLETE Result Date: 04/26/2024    ECHOCARDIOGRAM REPORT   Patient Name:   HYDEE FLEECE Date of Exam: 04/26/2024 Medical Rec #:  [id number]   Height:       64.0 in Accession #:    [id number]  Weight:       190.0 lb Date of Birth:  1985/04/24   BSA:          1.914 m Patient Age:    38 years    BP:           120/71 mmHg Patient Gender: F           HR:           75 bpm. Exam Location:  Inpatient Procedure: 2D Echo, Cardiac Doppler and Color Doppler (Both Spectral and Color            Flow Doppler were utilized during procedure). Indications:    Abnormal ECG R94.31  History:        Patient has no prior history of Echocardiogram examinations.  Sonographer:    Tinnie Gosling RDCS Referring Phys: [id number] HUSAM M [facility] IMPRESSIONS  1. Left ventricular ejection fraction, by estimation, is 60 to 65%. The left ventricle has normal function. The left ventricle has no regional wall motion abnormalities. Left ventricular diastolic parameters were normal.  2. Right  ventricular systolic function is normal. The right ventricular size is normal.  3. The mitral valve is abnormal. Trivial mitral valve regurgitation. No evidence of mitral stenosis.  4. The aortic valve is normal in structure. Aortic valve regurgitation is not visualized. No aortic stenosis is present.  5. The inferior vena cava is normal in size with greater than 50% respiratory variability, suggesting right atrial pressure of 3 mmHg. FINDINGS  Left Ventricle: Left ventricular ejection fraction, by estimation, is 60 to 65%. The left ventricle has normal function. The left ventricle has no regional wall motion abnormalities. Strain was performed and the global longitudinal strain is indeterminate. The left ventricular internal cavity size was normal in size. There is no left ventricular hypertrophy. Left ventricular diastolic parameters were normal. Right Ventricle: The right ventricular size is normal. No increase in right ventricular wall thickness. Right ventricular systolic function is normal. Left Atrium: Left atrial size was normal in size. Right Atrium: Right atrial size was normal in size. Pericardium: There is no evidence of pericardial effusion. Mitral Valve: The mitral valve is abnormal. There is mild thickening of the mitral valve leaflet(s). There is mild calcification of the mitral valve leaflet(s). Trivial mitral valve regurgitation. No evidence of mitral valve stenosis. Tricuspid Valve: The tricuspid valve is normal in structure. Tricuspid valve regurgitation is trivial. No evidence of tricuspid stenosis. Aortic Valve: The aortic valve is normal in structure. Aortic valve regurgitation is not visualized. No aortic stenosis is present. Pulmonic Valve: The pulmonic valve was normal in structure. Pulmonic valve regurgitation is not visualized. No evidence of pulmonic stenosis. Aorta: The aortic root is normal in size and structure. Venous: The inferior vena cava is normal in size with greater than 50%  respiratory variability, suggesting right atrial pressure of 3 mmHg. IAS/Shunts: No atrial level shunt detected by color flow Doppler. Additional Comments: 3D was performed not requiring image post processing on an independent workstation and was indeterminate.  LEFT VENTRICLE PLAX 2D LVIDd:         4.70 cm      Diastology LVIDs:         2.90 cm  LV e' medial:    11.50 cm/s LV PW:         0.90 cm      LV E/e' medial:  7.9 LV IVS:        0.90 cm      LV e' lateral:   17.00 cm/s LVOT diam:     2.10 cm      LV E/e' lateral: 5.3 LV SV:         82 LV SV Index:   43 LVOT Area:     3.46 cm  LV Volumes (MOD) LV vol d, MOD A2C: 117.0 ml LV vol d, MOD A4C: 105.0 ml LV vol s, MOD A2C: 32.7 ml LV vol s, MOD A4C: 41.4 ml LV SV MOD A2C:     84.3 ml LV SV MOD A4C:     105.0 ml LV SV MOD BP:      80.1 ml RIGHT VENTRICLE             IVC RV S prime:     17.10 cm/s  IVC diam: 2.00 cm TAPSE (M-mode): 2.5 cm                             PULMONARY VEINS                             Diastolic Velocity: 22.80 cm/s                             S/D Velocity:       1.30                             Systolic Velocity:  29.60 cm/s LEFT ATRIUM             Index        RIGHT ATRIUM           Index LA diam:        3.50 cm 1.83 cm/m   RA Area:     12.00 cm LA Vol (A2C):   56.1 ml 29.31 ml/m  RA Volume:   24.00 ml  12.54 ml/m LA Vol (A4C):   42.7 ml 22.31 ml/m LA Biplane Vol: 52.3 ml 27.32 ml/m  AORTIC VALVE LVOT Vmax:   118.00 cm/s LVOT Vmean:  76.900 cm/s LVOT VTI:    0.238 m  AORTA Ao Root diam: 2.50 cm Ao Asc diam:  3.00 cm MITRAL VALVE MV Area (PHT): 4.21 cm    SHUNTS MV Decel Time: 180 msec    Systemic VTI:  0.24 m MV E velocity: 90.70 cm/s  Systemic Diam: 2.10 cm MV A velocity: 81.80 cm/s MV E/A ratio:  1.11 Maude Emmer MD Electronically signed by Maude Emmer MD Signature Date/Time: 04/26/2024/10:53:29 AM    Final    Scheduled Meds:  busPIRone   15 mg Oral BID   docusate sodium   100 mg Oral BID   enoxaparin  (LOVENOX ) injection   40 mg Subcutaneous Q24H   folic acid   1 mg Oral q AM   pantoprazole   40 mg Oral q AM   potassium chloride   40 mEq Oral Once   scopolamine   1 patch Transdermal Q72H   sodium chloride  flush  3 mL Intravenous Q12H   sodium chloride  flush  3 mL Intravenous Q12H  thiamine   100 mg Oral Daily   Continuous Infusions:  potassium chloride  10 mEq (04/27/24 1333)     LOS: 1 day    Time spent: 35 mins    Darcel Dawley, MD [facility]   If 7PM-7AM, please contact night-coverage  "

## 2024-04-27 NOTE — Progress Notes (Signed)
 Dr. Leotis notified critical of K level of 2.7. See MD orders. Sherline Jubilee

## 2024-04-28 DIAGNOSIS — R1115 Cyclical vomiting syndrome unrelated to migraine: Secondary | ICD-10-CM | POA: Diagnosis not present

## 2024-04-28 LAB — BASIC METABOLIC PANEL WITH GFR
Anion gap: 15 (ref 5–15)
BUN: 5 mg/dL — ABNORMAL LOW (ref 6–20)
CO2: 25 mmol/L (ref 22–32)
Calcium: 9.1 mg/dL (ref 8.9–10.3)
Chloride: 93 mmol/L — ABNORMAL LOW (ref 98–111)
Creatinine, Ser: 0.56 mg/dL (ref 0.44–1.00)
GFR, Estimated: >60 mL/min
Glucose, Bld: 96 mg/dL (ref 70–99)
Potassium: 2.7 mmol/L — CL (ref 3.5–5.1)
Sodium: 133 mmol/L — ABNORMAL LOW (ref 135–145)

## 2024-04-28 LAB — MAGNESIUM: Magnesium: 2 mg/dL (ref 1.7–2.4)

## 2024-04-28 LAB — PHOSPHORUS: Phosphorus: 1.2 mg/dL — ABNORMAL LOW (ref 2.5–4.6)

## 2024-04-28 MED ORDER — POTASSIUM CHLORIDE CRYS ER 20 MEQ PO TBCR
40.0000 meq | EXTENDED_RELEASE_TABLET | Freq: Once | ORAL | Status: AC
  Administered 2024-04-28: 40 meq via ORAL
  Filled 2024-04-28: qty 2

## 2024-04-28 MED ORDER — SCOPOLAMINE 1 MG/3DAYS TD PT72: 1.0000 | MEDICATED_PATCH | TRANSDERMAL | 12 refills | Status: DC

## 2024-04-28 MED ORDER — POTASSIUM PHOSPHATES 15 MMOLE/5ML IV SOLN
30.0000 mmol | Freq: Once | INTRAVENOUS | Status: AC
  Administered 2024-04-28: 30 mmol via INTRAVENOUS
  Filled 2024-04-28: qty 30

## 2024-04-28 MED ORDER — METOCLOPRAMIDE HCL 10 MG PO TABS: 10.0000 mg | ORAL_TABLET | Freq: Three times a day (TID) | ORAL | 0 refills | Status: DC

## 2024-04-28 MED ORDER — POTASSIUM CHLORIDE CRYS ER 20 MEQ PO TBCR: 20.0000 meq | EXTENDED_RELEASE_TABLET | Freq: Every day | ORAL | 0 refills | Status: DC

## 2024-04-28 MED ORDER — BUSPIRONE HCL 15 MG PO TABS: 15.0000 mg | ORAL_TABLET | Freq: Two times a day (BID) | ORAL | 0 refills | Status: AC

## 2024-04-28 MED ORDER — GUAIFENESIN-DM 100-10 MG/5ML PO SYRP
5.0000 mL | ORAL_SOLUTION | ORAL | Status: DC | PRN
  Administered 2024-04-28: 5 mL via ORAL
  Filled 2024-04-28: qty 5

## 2024-04-28 MED ORDER — POTASSIUM CHLORIDE 10 MEQ/100ML IV SOLN
10.0000 meq | INTRAVENOUS | Status: AC
  Administered 2024-04-28 (×4): 10 meq via INTRAVENOUS
  Filled 2024-04-28 (×2): qty 100

## 2024-04-28 NOTE — Discharge Summary (Signed)
 Physician Discharge Summary  Jnya Brossard FMW:[id number] DOB: 05-14-1985 DOA: 04/25/2024  PCP: Billy Philippe SAUNDERS, NP  Admit date: 04/25/2024  Discharge date: 04/28/2024  Admitted From: Home  Disposition:  Home  Recommendations for Outpatient Follow-up:  Follow up with PCP in 1-2 weeks. Please obtain BMP/CBC in one week. Advised to take Reglan  10 mg every 8 hours as needed for nausea and vomiting. Advised to hold Adderall, Abilify, and Prozac  since it may increase the risk of QT prolongation. If nausea and vomiting continue to persist,  consider GI consult as an outpatient. Advised to take potassium chloride  20 meq  daily  Home Health:None Equipment/Devices:None  Discharge Condition: Stable CODE STATUS:Full code Diet recommendation: Heart Healthy  Brief Summary / Hospital Course: This 39 years old female with PMH significant of hyperlipidemia, prolonged QT interval, GERD, ADHD, anxiety, depression, obesity, IBS, chronic alcohol use, and cannabis use presented with complaints of intractable nausea and vomiting. Patient is hemodynamically stable. EKG shows prolonged QTc at 591. Labs significant for WBC 16.8, potassium 2.9, calcium 10.6, magnesium  1.1. Patient was given electrolyte replacement and IV hydration. Respiratory viral panel negative. UDS positive for benzos, amphetamine and THC. While in the ED patient had a brief episode of sinus tachycardia with frequent PVCs and a brief run of appeared to be torsades. Patient has received magnesium  sulfate and heart rhythm stabilized. Patient was admitted for further evaluation and started on scopolamine  patch for nausea and vomiting.  Cardiology was consulted and been following. Cardiology recommended to hold Abilify,  Adderall and Prozac  since they increase the risk of QT prolongation.  Reglan  has improved the nausea and vomiting.  Patient tolerated soft diet and she feels better, She wants to be discharged home.  Patient being discharged home  on potassium 20 mEq daily.  Discharge Diagnoses:  Principal Problem:   Cyclical vomiting syndrome Active Problems:   Intractable nausea and vomiting   Frequent PVCs   GERD (gastroesophageal reflux disease)   IBS (irritable bowel syndrome)   Chronic alcohol abuse   ADHD   Prolonged QT interval   Hypokalemia   Hypomagnesemia   Leukocytosis   Marijuana use   Torsades de pointes (HCC)  Cyclical vomiting syndrome in the setting of marijuana use: Intractable Nausea / Vomiting: Patient presented in the ED with nausea and vomiting, There was concern for flu exposure.   Patient reported nonstop vomiting for 24 hours.  Denies any abdominal pain, diarrhea, fever and chills. Labs significant for WBC 16.8, potassium 2.9, calcium 10.6, low magnesium  1.1.   Respiratory panel negative for COVID, RSV and flu. UDS positive with benzodiazepine, amphetamine and THC. Patient received electrolyte replacement, antinausea medications. Patient has a brief episode of sinus tachycardia with frequent PVC and brief run of appeared to be torsade.   Patient heart rhythm stabilized after receiving 1 g of magnesium  sulfate.   EKG has been obtained total 2 times which showed normal sinus rhythm,  heart rate 96 and prolonged QTc 591 ms. While in the ED patient did not had any episodes of nausea / vomiting.   Continue Protonix  daily, scopolamine  patch daily ,  Tigan  hasn't worked, started on Reglan  q 8 hrs prn. Counseled patient for marijuana smoking cessation. She reports feeling much better, Nausea and vomiting improving.   History of frequent PVC: Torsade the pointes - resolved. Prolonged QTC Likely In the setting of electrolyte derangement from intractable nausea, vomiting and polypharmacy due to Adderall, Prozac , Abilify and marijuana use. Need to avoid Adderall, Prozac , Abilify and marijuana. Need  to keep mag level above 2 potassium level above 4. Cardiology has been consulted and on board. TTE  completed and appears reassuring. Adderall Abilify and Prozac  kept on hold.   Hypokalemia / Hypomagnesemia: Hypokalemia / Hypomagnesemia in the setting of electrolyte derangement. Replaced.  Continue to monitor   ADHD: Hold Adderall in the setting of tachycardia with frequent PVC and torsade the point.   Generalized anxiety disorder Hold Prozac  and Abilify  the setting of prolonged QTc.  Continue BuSpar .   History of chronic marijuana use: History of chronic alcohol use: Patient denies alcohol use.  Continue thiamine  and folic acid .  Checking blood alcohol level.  Continue CIWA protocol is unreliable historian.   History of asthma: Continue DuoNeb as needed.    Discharge Instructions  Discharge Instructions     Call MD for:  difficulty breathing, headache or visual disturbances   Complete by: As directed    Call MD for:  persistant dizziness or light-headedness   Complete by: As directed    Call MD for:  persistant nausea and vomiting   Complete by: As directed    Diet general   Complete by: As directed    Discharge instructions   Complete by: As directed    Advised to follow-up with primary care physician in 1 week. Advised to take Reglan  10 mg every 8 hours as needed for nausea and vomiting. If nausea and vomiting continue to persist consider GI consult as an outpatient.   Increase activity slowly   Complete by: As directed       Allergies as of 04/28/2024       Reactions   Wellbutrin  [bupropion ] Hives   Zoloft  [sertraline ] Other (See Comments)   Diaphoresis  Insomnia    Neomycin Rash        Medication List     STOP taking these medications    amphetamine-dextroamphetamine 10 MG tablet Commonly known as: ADDERALL   amphetamine-dextroamphetamine 30 MG 24 hr capsule Commonly known as: ADDERALL XR   ARIPiprazole 5 MG tablet Commonly known as: ABILIFY   FLUoxetine  40 MG capsule Commonly known as: PROZAC    Wegovy 1 MG/0.5ML Soaj SQ  injection Generic drug: semaglutide-weight management       TAKE these medications    acetaminophen  500 MG tablet Commonly known as: TYLENOL  Take 1,000 mg by mouth every 6 (six) hours as needed for moderate pain (pain score 4-6) or mild pain (pain score 1-3).   busPIRone  15 MG tablet Commonly known as: BUSPAR  Take 1 tablet (15 mg total) by mouth 2 (two) times daily. What changed: Another medication with the same name was removed. Continue taking this medication, and follow the directions you see here.   cetirizine 10 MG tablet Commonly known as: ZYRTEC Take 10 mg by mouth daily.   folic acid  1 MG tablet Commonly known as: FOLVITE  Take 1 mg by mouth daily.   metoCLOPramide  10 MG tablet Commonly known as: REGLAN  Take 1 tablet (10 mg total) by mouth 3 (three) times daily with meals.   Mirena (52 MG) 20 MCG/DAY Iud Generic drug: levonorgestrel 1 each by Intrauterine route once.   pantoprazole  40 MG tablet Commonly known as: PROTONIX  TAKE 1 TABLET (40 MG TOTAL) BY MOUTH TWICE A DAY BEFORE MEALS What changed: See the new instructions.   potassium chloride  SA 20 MEQ tablet Commonly known as: KLOR-CON  M Take 1 tablet (20 mEq total) by mouth daily.   scopolamine  1 MG/3DAYS Commonly known as: TRANSDERM-SCOP Place 1 patch (1  mg total) onto the skin every 3 (three) days.        Follow-up Information     Billy Philippe SAUNDERS, NP Follow up in 1 week(s).   Specialty: Family Medicine Contact information: [address] [place] [address] [phone]                Allergies[1]  Consultations: Cardiology   Procedures/Studies: ECHOCARDIOGRAM COMPLETE Result Date: 04/26/2024    ECHOCARDIOGRAM REPORT   Patient Name:   KYLE STANSELL Date of Exam: 04/26/2024 Medical Rec #:  [id number]   Height:       64.0 in Accession #:    [id number]  Weight:       190.0 lb Date of Birth:  Mar 01, 1986   BSA:          1.914 m Patient Age:    38 years    BP:           120/71  mmHg Patient Gender: F           HR:           75 bpm. Exam Location:  Inpatient Procedure: 2D Echo, Cardiac Doppler and Color Doppler (Both Spectral and Color            Flow Doppler were utilized during procedure). Indications:    Abnormal ECG R94.31  History:        Patient has no prior history of Echocardiogram examinations.  Sonographer:    Tinnie Gosling RDCS Referring Phys: [id number] HUSAM M [facility] IMPRESSIONS  1. Left ventricular ejection fraction, by estimation, is 60 to 65%. The left ventricle has normal function. The left ventricle has no regional wall motion abnormalities. Left ventricular diastolic parameters were normal.  2. Right ventricular systolic function is normal. The right ventricular size is normal.  3. The mitral valve is abnormal. Trivial mitral valve regurgitation. No evidence of mitral stenosis.  4. The aortic valve is normal in structure. Aortic valve regurgitation is not visualized. No aortic stenosis is present.  5. The inferior vena cava is normal in size with greater than 50% respiratory variability, suggesting right atrial pressure of 3 mmHg. FINDINGS  Left Ventricle: Left ventricular ejection fraction, by estimation, is 60 to 65%. The left ventricle has normal function. The left ventricle has no regional wall motion abnormalities. Strain was performed and the global longitudinal strain is indeterminate. The left ventricular internal cavity size was normal in size. There is no left ventricular hypertrophy. Left ventricular diastolic parameters were normal. Right Ventricle: The right ventricular size is normal. No increase in right ventricular wall thickness. Right ventricular systolic function is normal. Left Atrium: Left atrial size was normal in size. Right Atrium: Right atrial size was normal in size. Pericardium: There is no evidence of pericardial effusion. Mitral Valve: The mitral valve is abnormal. There is mild thickening of the mitral valve leaflet(s). There is mild  calcification of the mitral valve leaflet(s). Trivial mitral valve regurgitation. No evidence of mitral valve stenosis. Tricuspid Valve: The tricuspid valve is normal in structure. Tricuspid valve regurgitation is trivial. No evidence of tricuspid stenosis. Aortic Valve: The aortic valve is normal in structure. Aortic valve regurgitation is not visualized. No aortic stenosis is present. Pulmonic Valve: The pulmonic valve was normal in structure. Pulmonic valve regurgitation is not visualized. No evidence of pulmonic stenosis. Aorta: The aortic root is normal in size and structure. Venous: The inferior vena cava is normal in size with greater than 50% respiratory variability, suggesting right atrial  pressure of 3 mmHg. IAS/Shunts: No atrial level shunt detected by color flow Doppler. Additional Comments: 3D was performed not requiring image post processing on an independent workstation and was indeterminate.  LEFT VENTRICLE PLAX 2D LVIDd:         4.70 cm      Diastology LVIDs:         2.90 cm      LV e' medial:    11.50 cm/s LV PW:         0.90 cm      LV E/e' medial:  7.9 LV IVS:        0.90 cm      LV e' lateral:   17.00 cm/s LVOT diam:     2.10 cm      LV E/e' lateral: 5.3 LV SV:         82 LV SV Index:   43 LVOT Area:     3.46 cm  LV Volumes (MOD) LV vol d, MOD A2C: 117.0 ml LV vol d, MOD A4C: 105.0 ml LV vol s, MOD A2C: 32.7 ml LV vol s, MOD A4C: 41.4 ml LV SV MOD A2C:     84.3 ml LV SV MOD A4C:     105.0 ml LV SV MOD BP:      80.1 ml RIGHT VENTRICLE             IVC RV S prime:     17.10 cm/s  IVC diam: 2.00 cm TAPSE (M-mode): 2.5 cm                             PULMONARY VEINS                             Diastolic Velocity: 22.80 cm/s                             S/D Velocity:       1.30                             Systolic Velocity:  29.60 cm/s LEFT ATRIUM             Index        RIGHT ATRIUM           Index LA diam:        3.50 cm 1.83 cm/m   RA Area:     12.00 cm LA Vol (A2C):   56.1 ml 29.31 ml/m  RA  Volume:   24.00 ml  12.54 ml/m LA Vol (A4C):   42.7 ml 22.31 ml/m LA Biplane Vol: 52.3 ml 27.32 ml/m  AORTIC VALVE LVOT Vmax:   118.00 cm/s LVOT Vmean:  76.900 cm/s LVOT VTI:    0.238 m  AORTA Ao Root diam: 2.50 cm Ao Asc diam:  3.00 cm MITRAL VALVE MV Area (PHT): 4.21 cm    SHUNTS MV Decel Time: 180 msec    Systemic VTI:  0.24 m MV E velocity: 90.70 cm/s  Systemic Diam: 2.10 cm MV A velocity: 81.80 cm/s MV E/A ratio:  1.11 Maude Emmer MD Electronically signed by Maude Emmer MD Signature Date/Time: 04/26/2024/10:53:29 AM    Final    Subjective: Patient was seen and examined at bedside.  Overnight events noted. Patient reports feeling much  improved and wants to be discharged,  nausea and vomiting improved  Discharge Exam: Vitals:   04/28/24 0757 04/28/24 1601  BP: (!) 141/90 (!) 156/99  Pulse: 85 78  Resp:  18  Temp: 98.5 F (36.9 C) 99.7 F (37.6 C)  SpO2: 99% 100%   Vitals:   04/27/24 2059 04/28/24 0423 04/28/24 0757 04/28/24 1601  BP: (!) 145/92 (!) 135/92 (!) 141/90 (!) 156/99  Pulse: 84 80 85 78  Resp: 17   18  Temp: 98.9 F (37.2 C) 98.2 F (36.8 C) 98.5 F (36.9 C) 99.7 F (37.6 C)  TempSrc: Oral Oral Oral   SpO2: 99% 100% 99% 100%  Weight:      Height:        General: Pt is alert, awake, not in acute distress Cardiovascular: RRR, S1/S2 +, no rubs, no gallops Respiratory: CTA bilaterally, no wheezing, no rhonchi Abdominal: Soft, NT, ND, bowel sounds + Extremities: no edema, no cyanosis    The results of significant diagnostics from this hospitalization (including imaging, microbiology, ancillary and laboratory) are listed below for reference.     Microbiology: Recent Results (from the past 240 hours)  Resp panel by RT-PCR (RSV, Flu A&B, Covid) Anterior Nasal Swab     Status: None   Collection Time: 04/25/24  9:22 AM   Specimen: Anterior Nasal Swab  Result Value Ref Range Status   SARS Coronavirus 2 by RT PCR NEGATIVE NEGATIVE Final    Comment:  (NOTE) SARS-CoV-2 target nucleic acids are NOT DETECTED.  The SARS-CoV-2 RNA is generally detectable in upper respiratory specimens during the acute phase of infection. The lowest concentration of SARS-CoV-2 viral copies this assay can detect is 138 copies/mL. A negative result does not preclude SARS-Cov-2 infection and should not be used as the sole basis for treatment or other patient management decisions. A negative result may occur with  improper specimen collection/handling, submission of specimen other than nasopharyngeal swab, presence of viral mutation(s) within the areas targeted by this assay, and inadequate number of viral copies(<138 copies/mL). A negative result must be combined with clinical observations, patient history, and epidemiological information. The expected result is Negative.  Fact Sheet for Patients:  bloggercourse.com  Fact Sheet for Healthcare Providers:  seriousbroker.it  This test is no t yet approved or cleared by the [place]  FDA and  has been authorized for detection and/or diagnosis of SARS-CoV-2 by FDA under an Emergency Use Authorization (EUA). This EUA will remain  in effect (meaning this test can be used) for the duration of the COVID-19 declaration under Section 564(b)(1) of the Act, 21 U.S.C.section 360bbb-3(b)(1), unless the authorization is terminated  or revoked sooner.       Influenza A by PCR NEGATIVE NEGATIVE Final   Influenza B by PCR NEGATIVE NEGATIVE Final    Comment: (NOTE) The Xpert Xpress SARS-CoV-2/FLU/RSV plus assay is intended as an aid in the diagnosis of influenza from Nasopharyngeal swab specimens and should not be used as a sole basis for treatment. Nasal washings and aspirates are unacceptable for Xpert Xpress SARS-CoV-2/FLU/RSV testing.  Fact Sheet for Patients: bloggercourse.com  Fact Sheet for Healthcare  Providers: seriousbroker.it  This test is not yet approved or cleared by the [place]  FDA and has been authorized for detection and/or diagnosis of SARS-CoV-2 by FDA under an Emergency Use Authorization (EUA). This EUA will remain in effect (meaning this test can be used) for the duration of the COVID-19 declaration under Section 564(b)(1) of the Act, 21 U.S.C. section  360bbb-3(b)(1), unless the authorization is terminated or revoked.     Resp Syncytial Virus by PCR NEGATIVE NEGATIVE Final    Comment: (NOTE) Fact Sheet for Patients: bloggercourse.com  Fact Sheet for Healthcare Providers: seriousbroker.it  This test is not yet approved or cleared by the [place]  FDA and has been authorized for detection and/or diagnosis of SARS-CoV-2 by FDA under an Emergency Use Authorization (EUA). This EUA will remain in effect (meaning this test can be used) for the duration of the COVID-19 declaration under Section 564(b)(1) of the Act, 21 U.S.C. section 360bbb-3(b)(1), unless the authorization is terminated or revoked.  Performed at [facility], [address], [place], [place] [address]      Labs: BNP (last 3 results) No results for input(s): BNP in the last 8760 hours. Basic Metabolic Panel: Recent Labs  Lab 04/25/24 0920 04/25/24 0944 04/25/24 2134 04/26/24 0342 04/27/24 1005 04/28/24 1047  NA 144  --  143 139 138 133*  K 2.9*  --  3.2* 2.8* 2.7* 2.7*  CL 98  --  102 99 94* 93*  CO2 23  --  25 25 27 25   GLUCOSE 137*  --  98 113* 84 96  BUN 9  --  8 7 7  5*  CREATININE 0.74  --  0.64 0.63 0.58 0.56  CALCIUM 10.6*  --  8.7* 8.6* 9.3 9.1  MG  --  1.1* 2.2  --   --  2.0  PHOS  --   --  2.4*  --   --  1.2*   Liver Function Tests: Recent Labs  Lab 04/25/24 0920 04/26/24 0342  AST 23 21  ALT 15 12  ALKPHOS 65 52  BILITOT 0.9 0.7  PROT 8.2* 6.9  ALBUMIN 5.1* 4.4    Recent Labs  Lab 04/25/24 0920  LIPASE 16   No results for input(s): AMMONIA in the last 168 hours. CBC: Recent Labs  Lab 04/25/24 0920 04/26/24 0342  WBC 16.8* 13.9*  NEUTROABS 15.4*  --   HGB 13.6 12.4  HCT 38.2 34.9*  MCV 84.7 85.7  PLT 339 274   Cardiac Enzymes: No results for input(s): CKTOTAL, CKMB, CKMBINDEX, TROPONINI in the last 168 hours. BNP: Invalid input(s): POCBNP CBG: No results for input(s): GLUCAP in the last 168 hours. D-Dimer No results for input(s): DDIMER in the last 72 hours. Hgb A1c No results for input(s): HGBA1C in the last 72 hours. Lipid Profile No results for input(s): CHOL, HDL, LDLCALC, TRIG, CHOLHDL, LDLDIRECT in the last 72 hours. Thyroid  function studies Recent Labs    04/25/24 2134  TSH 1.250   Anemia work up No results for input(s): VITAMINB12, FOLATE, FERRITIN, TIBC, IRON, RETICCTPCT in the last 72 hours. Urinalysis    Component Value Date/Time   COLORURINE YELLOW 04/25/2024 1244   APPEARANCEUR HAZY (A) 04/25/2024 1244   LABSPEC 1.034 (H) 04/25/2024 1244   PHURINE 6.5 04/25/2024 1244   GLUCOSEU NEGATIVE 04/25/2024 1244   HGBUR NEGATIVE 04/25/2024 1244   BILIRUBINUR NEGATIVE 04/25/2024 1244   KETONESUR >80 (A) 04/25/2024 1244   PROTEINUR 100 (A) 04/25/2024 1244   UROBILINOGEN 0.2 12/09/2013 1226   NITRITE NEGATIVE 04/25/2024 1244   LEUKOCYTESUR NEGATIVE 04/25/2024 1244   Sepsis Labs Recent Labs  Lab 04/25/24 0920 04/26/24 0342  WBC 16.8* 13.9*   Microbiology Recent Results (from the past 240 hours)  Resp panel by RT-PCR (RSV, Flu A&B, Covid) Anterior Nasal Swab     Status: None   Collection Time: 04/25/24  9:22 AM   Specimen: Anterior Nasal Swab  Result Value Ref Range Status   SARS Coronavirus 2 by RT PCR NEGATIVE NEGATIVE Final    Comment: (NOTE) SARS-CoV-2 target nucleic acids are NOT DETECTED.  The SARS-CoV-2 RNA is generally detectable in upper  respiratory specimens during the acute phase of infection. The lowest concentration of SARS-CoV-2 viral copies this assay can detect is 138 copies/mL. A negative result does not preclude SARS-Cov-2 infection and should not be used as the sole basis for treatment or other patient management decisions. A negative result may occur with  improper specimen collection/handling, submission of specimen other than nasopharyngeal swab, presence of viral mutation(s) within the areas targeted by this assay, and inadequate number of viral copies(<138 copies/mL). A negative result must be combined with clinical observations, patient history, and epidemiological information. The expected result is Negative.  Fact Sheet for Patients:  bloggercourse.com  Fact Sheet for Healthcare Providers:  seriousbroker.it  This test is no t yet approved or cleared by the [place]  FDA and  has been authorized for detection and/or diagnosis of SARS-CoV-2 by FDA under an Emergency Use Authorization (EUA). This EUA will remain  in effect (meaning this test can be used) for the duration of the COVID-19 declaration under Section 564(b)(1) of the Act, 21 U.S.C.section 360bbb-3(b)(1), unless the authorization is terminated  or revoked sooner.       Influenza A by PCR NEGATIVE NEGATIVE Final   Influenza B by PCR NEGATIVE NEGATIVE Final    Comment: (NOTE) The Xpert Xpress SARS-CoV-2/FLU/RSV plus assay is intended as an aid in the diagnosis of influenza from Nasopharyngeal swab specimens and should not be used as a sole basis for treatment. Nasal washings and aspirates are unacceptable for Xpert Xpress SARS-CoV-2/FLU/RSV testing.  Fact Sheet for Patients: bloggercourse.com  Fact Sheet for Healthcare Providers: seriousbroker.it  This test is not yet approved or cleared by the [place]  FDA and has been  authorized for detection and/or diagnosis of SARS-CoV-2 by FDA under an Emergency Use Authorization (EUA). This EUA will remain in effect (meaning this test can be used) for the duration of the COVID-19 declaration under Section 564(b)(1) of the Act, 21 U.S.C. section 360bbb-3(b)(1), unless the authorization is terminated or revoked.     Resp Syncytial Virus by PCR NEGATIVE NEGATIVE Final    Comment: (NOTE) Fact Sheet for Patients: bloggercourse.com  Fact Sheet for Healthcare Providers: seriousbroker.it  This test is not yet approved or cleared by the [place]  FDA and has been authorized for detection and/or diagnosis of SARS-CoV-2 by FDA under an Emergency Use Authorization (EUA). This EUA will remain in effect (meaning this test can be used) for the duration of the COVID-19 declaration under Section 564(b)(1) of the Act, 21 U.S.C. section 360bbb-3(b)(1), unless the authorization is terminated or revoked.  Performed at [facility], [address] [place], [place] [address]      Time coordinating discharge: Over 30 minutes  SIGNED:   Darcel Dawley, MD  [facility] 04/28/2024, 4:06 PM Pager   If 7PM-7AM, please contact night-coverage     [1]  Allergies Allergen Reactions   Wellbutrin  [Bupropion ] Hives   Zoloft  [Sertraline ] Other (See Comments)    Diaphoresis  Insomnia    Neomycin Rash

## 2024-04-28 NOTE — Plan of Care (Signed)
   Problem: Health Behavior/Discharge Planning: Goal: Ability to manage health-related needs will improve Outcome: Progressing   Problem: Nutrition: Goal: Adequate nutrition will be maintained Outcome: Progressing   Problem: Coping: Goal: Level of anxiety will decrease Outcome: Progressing

## 2024-04-28 NOTE — Progress Notes (Signed)
 Patient to be discharged after potassium infusions complete as instructed by MD.

## 2024-04-28 NOTE — Plan of Care (Signed)
   Problem: Nutrition: Goal: Adequate nutrition will be maintained Outcome: Progressing

## 2024-04-28 NOTE — TOC Initial Note (Signed)
 Transition of Care [facility]) - Initial/Assessment Note    Patient Details  Name: Angela Dixon MRN: [id number] Date of Birth: December 08, 1985  Transition of Care [facility]) CM/SW Contact:    Lendia Dais, LCSWA Phone Number: 04/28/2024, 10:07 AM  Clinical Narrative: Pt is from home w/ minor children ages 49 and 16. Pt reports no DME, indep w/ ADL's, has seen PCP in the last year, and takes meds as prescribed.    Pt reports no SDOH concerns and works full time. CSW inquired about MH/SU hx. Pt states that they currently experience anxiety and depression and received therapy through [facility] Pt states that they currently use alcohol and have one drink a day and report they have never been to a rehab facility for alcohol use.  No current TOC needs. Please place [facility] consult for any further needs.                Expected Discharge Plan: Home/Self Care Barriers to Discharge: Continued Medical Work up   Patient Goals and CMS Choice Patient states their goals for this hospitalization and ongoing recovery are:: lose weight   Choice offered to / list presented to : NA      Expected Discharge Plan and Services In-house Referral: Clinical Social Work     Living arrangements for the past 2 months: Single Family Home                                      Prior Living Arrangements/Services Living arrangements for the past 2 months: Single Family Home Lives with:: Self, Minor Children (9 and 26 y.o.) Patient language and need for interpreter reviewed:: Yes Do you feel safe going back to the place where you live?: Yes      Need for Family Participation in Patient Care: No (Comment) Care giver support system in place?: No (comment)   Criminal Activity/Legal Involvement Pertinent to Current Situation/Hospitalization: No - Comment as needed  Activities of Daily Living   ADL Screening (condition at time of admission) Independently performs ADLs?: Yes (appropriate for developmental  age) Is the patient deaf or have difficulty hearing?: No Does the patient have difficulty seeing, even when wearing glasses/contacts?: No Does the patient have difficulty concentrating, remembering, or making decisions?: No  Permission Sought/Granted Permission sought to share information with : Family Supports Permission granted to share information with : Yes, Verbal Permission Granted  Share Information with NAME: Marval Pinal     Permission granted to share info w Relationship: Mother  Permission granted to share info w Contact Information: [phone]  Emotional Assessment Appearance:: Appears stated age Attitude/Demeanor/Rapport: Engaged Affect (typically observed): Appropriate, Pleasant Orientation: : Oriented to Self, Oriented to Place, Oriented to  Time, Oriented to Situation Alcohol / Substance Use: Alcohol Use Psych Involvement: No (comment)  Admission diagnosis:  Hypokalemia [E87.6] Hypomagnesemia [E83.42] Intractable nausea and vomiting [R11.2] Patient Active Problem List   Diagnosis Date Noted   Intractable nausea and vomiting 04/25/2024   Frequent PVCs 04/25/2024   Cyclical vomiting syndrome 04/25/2024   Marijuana use 04/25/2024   Torsades de pointes (HCC) 04/25/2024   AKI (acute kidney injury) 12/28/2023   Prolonged QT interval 12/28/2023   Hypokalemia 12/28/2023   Hypomagnesemia 12/28/2023   Leukocytosis 12/28/2023   Erythrocytosis 12/28/2023   Thrombocytosis 12/28/2023   High anion gap metabolic acidosis 12/28/2023   Clinical trial participant 04/04/2023   LFT elevation 10/17/2021   ASCUS  of cervix with negative high risk HPV 03/07/2021   B12 deficiency 02/15/2021   Mixed hyperlipidemia 02/13/2021   ADHD 11/17/2020   Elevated BP without diagnosis of hypertension 01/27/2020   Morbid obesity with BMI of 40.0-44.9, adult (HCC) 01/26/2020   Chronic alcohol abuse 09/29/2018   Stress and adjustment reaction 09/29/2018   IBS (irritable bowel syndrome)  05/12/2018   GERD (gastroesophageal reflux disease) 01/09/2017   Medication management 01/31/2015   Depression 02/08/2014   Generalized anxiety disorder 02/08/2014   Vitamin D  deficiency    PCP:  Billy Philippe SAUNDERS, NP Pharmacy:   [facility] [phone] - [place], [place] - [address] AT [facility] [address] [place] [address] Phone: [phone] Fax: [phone]     Social Drivers of Health (SDOH) Social History: SDOH Screenings   Food Insecurity: No Food Insecurity (04/26/2024)  Housing: Low Risk (04/26/2024)  Transportation Needs: No Transportation Needs (04/26/2024)  Utilities: Not At Risk (04/26/2024)  Alcohol Screen: Low Risk (10/21/2023)  Depression (PHQ2-9): Low Risk (10/22/2023)  Financial Resource Strain: Low Risk (10/21/2023)  Physical Activity: Insufficiently Active (10/21/2023)  Social Connections: Socially Isolated (10/21/2023)  Stress: Stress Concern Present (10/21/2023)  Tobacco Use: Medium Risk (04/25/2024)  Health Literacy: Adequate Health Literacy (10/22/2023)   SDOH Interventions:     Readmission Risk Interventions     No data to display

## 2024-04-28 NOTE — Discharge Instructions (Signed)
 Advised to follow-up with primary care physician in 1 week. Advised to take Reglan  10 mg every 8 hours as needed for nausea and vomiting. If nausea and vomiting continue to persist consider GI consult as an outpatient.

## 2024-04-28 NOTE — Progress Notes (Addendum)
"  °  Progress Note  Patient Name: Angela Dixon Date of Encounter: 04/28/2024 [facility] Cardiologist: Vallery Mcdade K Shaquelle Hernon, MD   Interval Summary    Feeling much better this morning, no further nausea/vomiting. Labs pending   Vital Signs Vitals:   04/27/24 1722 04/27/24 2059 04/28/24 0423 04/28/24 0757  BP: (!) 148/92 (!) 145/92 (!) 135/92 (!) 141/90  Pulse: 81 84 80 85  Resp: 18 17    Temp: 98.2 F (36.8 C) 98.9 F (37.2 C) 98.2 F (36.8 C) 98.5 F (36.9 C)  TempSrc:  Oral Oral Oral  SpO2: 100% 99% 100% 99%  Weight:      Height:        Intake/Output Summary (Last 24 hours) at 04/28/2024 0915 Last data filed at 04/28/2024 0028 Gross per 24 hour  Intake 240 ml  Output --  Net 240 ml      04/25/2024    9:03 AM 04/25/2024    9:01 AM 01/07/2024    8:07 AM  Last 3 Weights  Weight (lbs) 190 lb 190 lb 178 lb 9.6 oz  Weight (kg) 86.183 kg 86.183 kg 81.012 kg      Telemetry/ECG   Sinus Rhythm, PVCs, intermittent episodes of bigeminy - Personally Reviewed  Physical Exam  GEN: No acute distress.   Neck: No JVD Cardiac: RRR, no murmurs, rubs, or gallops.  Respiratory: Clear to auscultation bilaterally. GI: Soft, nontender, non-distended  MS: No edema  Assessment & Plan   39 y.o. female with a history of hyperlipidemia, GERD, IBS, anxiety/ depression, ADHD, obesity, alcohol abuse, and marijauna abuse with cannabis hyperemesis syndrome who was admitted on 04/25/2024 for prolonged QTc and Torsades in setting of severe electrolyte abnormalities due to possible cannabis hyperemesis.    Prolonged Qtc Torsades de Pointes  -- presented with nausea/vomiting and found to have severe hypokalemia and hypomagnesemia. Complicated by prolonged QTc and brief episode of Torsades while at [facility] ED (strips were not available). Prozac  held, electrolytes repleted -- Echo showed LVEF of 60-65% with normal wall motion, normal RV function, and no significant valvular disease -- EKG  yesterday QTc was still prolonged at 528 -- K+ 2.7, Mg 2.2, BMET, mg pending this morning  -- repeat EKG this morning  -- suspect this is related to electrolyte abnormalities in the setting of n/v. Recommend avoiding QTc prolonging medications (Adderall, Prozac , and Abilify have been stopped).   Per Primary Intractable nausea/ vomiting Cannabis hyperemesis syndrome Anxiety/ depression ADHD IBS GERD Asthma Hyperlipidemia  For questions or updates, please contact  [facility] Please consult [email or web address] for contact info under   Signed, Manuelita Rummer, NP    ATTENDING ATTESTATION:  After conducting a review of all available clinical information with the care team, interviewing the patient, and performing a physical exam, I agree with the findings and plan described in this note with adjustments as indicated below which were discussed and enacted by staff above.   GEN: No acute distress, AO x 3 HEENT:  MMM, no JVD, no scleral icterus Cardiac: RRR, no murmurs, rubs, or gallops.  Respiratory: Clear to auscultation bilaterally. GI: Soft, nontender, non-distended  MS: No edema; No deformity. Neuro:  Nonfocal  Vasc:  +2 radial pulses  Nausea and vomiting improved today.  Had one limited episode this AM.  EKG this AM with QTc .  Repeat labs pending.  Would continue to aggressively replete Mg>2 and K>4.  Rylie Knierim, MD Pager [phone]   "

## 2024-04-29 DIAGNOSIS — R1115 Cyclical vomiting syndrome unrelated to migraine: Secondary | ICD-10-CM | POA: Diagnosis not present

## 2024-04-29 LAB — BASIC METABOLIC PANEL WITH GFR
Anion gap: 16 — ABNORMAL HIGH (ref 5–15)
BUN: 5 mg/dL — ABNORMAL LOW (ref 6–20)
CO2: 25 mmol/L (ref 22–32)
Calcium: 9.3 mg/dL (ref 8.9–10.3)
Chloride: 92 mmol/L — ABNORMAL LOW (ref 98–111)
Creatinine, Ser: 0.7 mg/dL (ref 0.44–1.00)
GFR, Estimated: >60 mL/min
Glucose, Bld: 83 mg/dL (ref 70–99)
Potassium: 3 mmol/L — ABNORMAL LOW (ref 3.5–5.1)
Sodium: 133 mmol/L — ABNORMAL LOW (ref 135–145)

## 2024-04-29 LAB — MAGNESIUM: Magnesium: 2.1 mg/dL (ref 1.7–2.4)

## 2024-04-29 LAB — PHOSPHORUS: Phosphorus: 3.2 mg/dL (ref 2.5–4.6)

## 2024-04-29 MED ORDER — POTASSIUM CHLORIDE 20 MEQ PO PACK
40.0000 meq | PACK | Freq: Once | ORAL | Status: AC
  Administered 2024-04-29: 40 meq via ORAL
  Filled 2024-04-29: qty 2

## 2024-04-29 NOTE — Progress Notes (Signed)
 " PROGRESS NOTE    Angela Dixon  FMW:[id number] DOB: 1985-08-11 DOA: 04/25/2024 PCP: Billy Philippe SAUNDERS, NP   Brief Narrative:  This 39 years old female with PMH significant of hyperlipidemia, prolonged QT interval, GERD,  ADHD, anxiety, depression, obesity, IBS, chronic alcohol use, and cannabis use presented with complaints of intractable nausea and vomiting.  Patient is hemodynamically stable.  EKG shows prolonged QTc at 591.  Labs significant for WBC 16.8, potassium 2.9, calcium 10.6, magnesium  1.1.  Patient was given electrolyte replacement and IV hydration.  Respiratory viral panel negative.  UDS positive for benzos, amphetamine and THC.  While in the ED patient had a brief episode of sinus tachycardia with frequent PVCs and a brief run of appeared to be torsades.  Patient has received magnesium  sulfate and heart rhythm stabilized.  Patient was admitted for further evaluation and started on scopolamine  patch for nausea and vomiting.  Assessment & Plan:   Principal Problem:   Cyclical vomiting syndrome Active Problems:   Intractable nausea and vomiting   Frequent PVCs   GERD (gastroesophageal reflux disease)   IBS (irritable bowel syndrome)   Chronic alcohol abuse   ADHD   Prolonged QT interval   Hypokalemia   Hypomagnesemia   Leukocytosis   Marijuana use   Torsades de pointes (HCC)  Cyclical vomiting syndrome in the setting of marijuana use: Intractable Nausea / Vomiting: Patient presented in the ED with nausea and vomiting, There was concern for flu exposure.   Patient reported nonstop vomiting for 24 hours.  Denies any abdominal pain, diarrhea, fever and chills. Labs significant for WBC 16.8, potassium 2.9, calcium 10.6, low magnesium  1.1.   Respiratory panel negative for COVID, RSV and flu. UDS positive with benzodiazepine, amphetamine and THC. Patient received electrolyte replacement, antinausea medications. Patient has a brief episode of sinus tachycardia with frequent  PVC and brief run of appeared to be torsade.   Patient heart rhythm stabilized after receiving 1 g of magnesium  sulfate.   EKG has been obtained total 2 times which showed normal sinus rhythm,  heart rate 96 and prolonged QTc 591 ms. While in the ED patient did not had any episodes of nausea / vomiting.   Continue Protonix  daily, scopolamine  patch daily ,  Tigan  hasn't worked, started on Reglan  q 8 hrs prn. Counseled patient for marijuana smoking cessation. She reports feeling much better, Nausea and vomiting improving. Continue Reglan  30 mg every 8 hours hours as needed.   History of frequent PVC: Torsade the pointes - resolved. Prolonged QTC Likely In the setting of electrolyte derangement from intractable nausea, vomiting and polypharmacy due to Adderall, Prozac , Abilify and marijuana use. Need to avoid Adderall, Prozac , Abilify and marijuana. Need to keep mag level above 2 potassium level above 4. Cardiology has been consulted and on board. TTE completed and appears reassuring.   Hypokalemia / Hypomagnesemia: Hypokalemia / Hypomagnesemia in the setting of electrolyte derangement. Potassium continued to remain low.  Cardiologist recommended aggressive IV replacement.  ADHD: Hold Adderall in the setting of tachycardia with frequent PVC and torsade the point.   Generalized anxiety disorder Hold Prozac  and Abilify  the setting of prolonged QTc.  Continue BuSpar .   History of chronic marijuana use: History of chronic alcohol use: Patient denies alcohol use.  Continue thiamine  and folic acid .  Checking blood alcohol level.  Continue CIWA protocol is unreliable historian.   History of asthma: Continue DuoNeb as needed.    DVT prophylaxis: Lovenox  Code Status: Full code Family Communication: No family  at bed side. Disposition Plan:    Status is: Inpatient Remains inpatient appropriate because: Admitted for intractable nausea and vomiting and found to have episode of  torsades.  Cardiology consulted.  Patient not medically ready for discharge.    Consultants:  Cardiology  Procedures: Echo  Antimicrobials:  Anti-infectives (From admission, onward)    None      Subjective: Patient was seen and examined at bedside.  Overnight events noted. Patient reports nausea has improved,  Vomiting has improved. Patient continues to remain low, cardiology recommended aggressive IV replacement.  Objective: Vitals:   04/28/24 1601 04/28/24 2046 04/29/24 0517 04/29/24 0800  BP: (!) 156/99 (!) 133/91 (!) 144/91 (!) 142/89  Pulse: 78 92 82 74  Resp: 18 18 18 18   Temp: 99.7 F (37.6 C) 97.7 F (36.5 C) 98.2 F (36.8 C) 98.6 F (37 C)  TempSrc:  Oral Oral   SpO2: 100% 98% 100% 100%  Weight:      Height:        Intake/Output Summary (Last 24 hours) at 04/29/2024 1346 Last data filed at 04/29/2024 0200 Gross per 24 hour  Intake 240 ml  Output --  Net 240 ml   Filed Weights   04/25/24 0901 04/25/24 0903  Weight: 86.2 kg 86.2 kg    Examination:  General exam: Appears calm and comfortable, not in any acute distress. Respiratory system: CTA Bilaterally . Respiratory effort normal.  RR 14 Cardiovascular system: S1 & S2 heard, RRR. No JVD, murmurs, rubs, gallops or clicks.  Gastrointestinal system: Abdomen is nondistended, soft and nontender.  Normal bowel sounds heard. Central nervous system: Alert and oriented x 3. No focal neurological deficits. Extremities: No edema, no cyanosis, no clubbing Skin: No rashes, lesions or ulcers Psychiatry: Judgement and insight appear normal. Mood & affect appropriate.     Data Reviewed: I have personally reviewed following labs and imaging studies  CBC: Recent Labs  Lab 04/25/24 0920 04/26/24 0342  WBC 16.8* 13.9*  NEUTROABS 15.4*  --   HGB 13.6 12.4  HCT 38.2 34.9*  MCV 84.7 85.7  PLT 339 274   Basic Metabolic Panel: Recent Labs  Lab 04/25/24 0944 04/25/24 2134 04/26/24 0342 04/27/24 1005  04/28/24 1047 04/29/24 0818 04/29/24 0832  NA  --  143 139 138 133* 133*  --   K  --  3.2* 2.8* 2.7* 2.7* 3.0*  --   CL  --  102 99 94* 93* 92*  --   CO2  --  25 25 27 25 25   --   GLUCOSE  --  98 113* 84 96 83  --   BUN  --  8 7 7  5* 5*  --   CREATININE  --  0.64 0.63 0.58 0.56 0.70  --   CALCIUM  --  8.7* 8.6* 9.3 9.1 9.3  --   MG 1.1* 2.2  --   --  2.0  --  2.1  PHOS  --  2.4*  --   --  1.2*  --  3.2   GFR: Estimated Creatinine Clearance: 100.3 mL/min (by C-G formula based on SCr of 0.7 mg/dL). Liver Function Tests: Recent Labs  Lab 04/25/24 0920 04/26/24 0342  AST 23 21  ALT 15 12  ALKPHOS 65 52  BILITOT 0.9 0.7  PROT 8.2* 6.9  ALBUMIN 5.1* 4.4   Recent Labs  Lab 04/25/24 0920  LIPASE 16   No results for input(s): AMMONIA in the last 168 hours. Coagulation Profile: No results  for input(s): INR, PROTIME in the last 168 hours. Cardiac Enzymes: No results for input(s): CKTOTAL, CKMB, CKMBINDEX, TROPONINI in the last 168 hours. BNP (last 3 results) No results for input(s): PROBNP in the last 8760 hours. HbA1C: No results for input(s): HGBA1C in the last 72 hours. CBG: No results for input(s): GLUCAP in the last 168 hours. Lipid Profile: No results for input(s): CHOL, HDL, LDLCALC, TRIG, CHOLHDL, LDLDIRECT in the last 72 hours. Thyroid  Function Tests: No results for input(s): TSH, T4TOTAL, FREET4, T3FREE, THYROIDAB in the last 72 hours.  Anemia Panel: No results for input(s): VITAMINB12, FOLATE, FERRITIN, TIBC, IRON, RETICCTPCT in the last 72 hours. Sepsis Labs: No results for input(s): PROCALCITON, LATICACIDVEN in the last 168 hours.  Recent Results (from the past 240 hours)  Resp panel by RT-PCR (RSV, Flu A&B, Covid) Anterior Nasal Swab     Status: None   Collection Time: 04/25/24  9:22 AM   Specimen: Anterior Nasal Swab  Result Value Ref Range Status   SARS Coronavirus 2 by RT PCR NEGATIVE NEGATIVE  Final    Comment: (NOTE) SARS-CoV-2 target nucleic acids are NOT DETECTED.  The SARS-CoV-2 RNA is generally detectable in upper respiratory specimens during the acute phase of infection. The lowest concentration of SARS-CoV-2 viral copies this assay can detect is 138 copies/mL. A negative result does not preclude SARS-Cov-2 infection and should not be used as the sole basis for treatment or other patient management decisions. A negative result may occur with  improper specimen collection/handling, submission of specimen other than nasopharyngeal swab, presence of viral mutation(s) within the areas targeted by this assay, and inadequate number of viral copies(<138 copies/mL). A negative result must be combined with clinical observations, patient history, and epidemiological information. The expected result is Negative.  Fact Sheet for Patients:  bloggercourse.com  Fact Sheet for Healthcare Providers:  seriousbroker.it  This test is no t yet approved or cleared by the [place]  FDA and  has been authorized for detection and/or diagnosis of SARS-CoV-2 by FDA under an Emergency Use Authorization (EUA). This EUA will remain  in effect (meaning this test can be used) for the duration of the COVID-19 declaration under Section 564(b)(1) of the Act, 21 U.S.C.section 360bbb-3(b)(1), unless the authorization is terminated  or revoked sooner.       Influenza A by PCR NEGATIVE NEGATIVE Final   Influenza B by PCR NEGATIVE NEGATIVE Final    Comment: (NOTE) The Xpert Xpress SARS-CoV-2/FLU/RSV plus assay is intended as an aid in the diagnosis of influenza from Nasopharyngeal swab specimens and should not be used as a sole basis for treatment. Nasal washings and aspirates are unacceptable for Xpert Xpress SARS-CoV-2/FLU/RSV testing.  Fact Sheet for Patients: bloggercourse.com  Fact Sheet for Healthcare  Providers: seriousbroker.it  This test is not yet approved or cleared by the [place]  FDA and has been authorized for detection and/or diagnosis of SARS-CoV-2 by FDA under an Emergency Use Authorization (EUA). This EUA will remain in effect (meaning this test can be used) for the duration of the COVID-19 declaration under Section 564(b)(1) of the Act, 21 U.S.C. section 360bbb-3(b)(1), unless the authorization is terminated or revoked.     Resp Syncytial Virus by PCR NEGATIVE NEGATIVE Final    Comment: (NOTE) Fact Sheet for Patients: bloggercourse.com  Fact Sheet for Healthcare Providers: seriousbroker.it  This test is not yet approved or cleared by the [place]  FDA and has been authorized for detection and/or diagnosis of SARS-CoV-2 by FDA under an Emergency  Use Authorization (EUA). This EUA will remain in effect (meaning this test can be used) for the duration of the COVID-19 declaration under Section 564(b)(1) of the Act, 21 U.S.C. section 360bbb-3(b)(1), unless the authorization is terminated or revoked.  Performed at [facility], [address] [place], [place] [address]     Radiology Studies: No results found.  Scheduled Meds:  busPIRone   15 mg Oral BID   docusate sodium   100 mg Oral BID   enoxaparin  (LOVENOX ) injection  40 mg Subcutaneous Q24H   folic acid   1 mg Oral q AM   pantoprazole   40 mg Oral q AM   scopolamine   1 patch Transdermal Q72H   sodium chloride  flush  3 mL Intravenous Q12H   sodium chloride  flush  3 mL Intravenous Q12H   thiamine   100 mg Oral Daily   Continuous Infusions:     LOS: 3 days    Time spent: 35 mins    Darcel Dawley, MD [facility]   If 7PM-7AM, please contact night-coverage  "

## 2024-04-29 NOTE — Progress Notes (Addendum)
 "  Progress Note  Patient Name: Angela Dixon Date of Encounter: 04/29/2024 [facility] Cardiologist: Jahnaya Branscome K Frieda Arnall, MD   Interval Summary    Still continues to have nausea and episodes of vomiting this morning. Wants to go home, feels weak.   Vital Signs Vitals:   04/28/24 1601 04/28/24 2046 04/29/24 0517 04/29/24 0800  BP: (!) 156/99 (!) 133/91 (!) 144/91 (!) 142/89  Pulse: 78 92 82 74  Resp: 18 18 18 18   Temp: 99.7 F (37.6 C) 97.7 F (36.5 C) 98.2 F (36.8 C) 98.6 F (37 C)  TempSrc:  Oral Oral   SpO2: 100% 98% 100% 100%  Weight:      Height:        Intake/Output Summary (Last 24 hours) at 04/29/2024 0947 Last data filed at 04/29/2024 0200 Gross per 24 hour  Intake 240 ml  Output --  Net 240 ml      04/25/2024    9:03 AM 04/25/2024    9:01 AM 01/07/2024    8:07 AM  Last 3 Weights  Weight (lbs) 190 lb 190 lb 178 lb 9.6 oz  Weight (kg) 86.183 kg 86.183 kg 81.012 kg      Telemetry/ECG   Sinus Rhythm, PVCs, bigeminy at times - Personally Reviewed  Physical Exam  GEN: No acute distress.   Neck: No JVD Cardiac: RRR, no murmurs, rubs, or gallops.  Respiratory: Clear to auscultation bilaterally. GI: Soft, nontender, non-distended  MS: No edema  Assessment & Plan   39 y.o. female with a history of hyperlipidemia, GERD, IBS, anxiety/ depression, ADHD, obesity, alcohol abuse, and marijauna abuse with cannabis hyperemesis syndrome who was admitted on 04/25/2024 for prolonged QTc and Torsades in setting of severe electrolyte abnormalities due to possible cannabis hyperemesis.     Prolonged Qtc Torsades de Pointes  -- presented with nausea/vomiting and found to have severe hypokalemia and hypomagnesemia. Complicated by prolonged QTc and brief episode of Torsades while at [facility] ED (strips were not available). Prozac  held, electrolytes repleted -- Echo showed LVEF of 60-65% with normal wall motion, normal RV function, and no significant valvular disease --  EKG yesterday QTc was still prolonged at 485 -- K+ 3.0, Mg 3.2 -- suspect this is related to electrolyte abnormalities in the setting of n/v. Recommend avoiding QTc prolonging medications (Adderall, Prozac , and Abilify have been stopped). Would recommend keeping until K+ within normal range as she continues to have episodes of vomiting    Per Primary Intractable nausea/ vomiting Cannabis hyperemesis syndrome Anxiety/ depression ADHD IBS GERD Asthma Hyperlipidemia  For questions or updates, please contact [facility] Please consult [email or web address] for contact info under         Signed, Manuelita Rummer, NP   ATTENDING ATTESTATION:  After conducting a review of all available clinical information with the care team, interviewing the patient, and performing a physical exam, I agree with the findings and plan described in this note with adjustments as indicated below which were discussed and enacted by staff above.   GEN: No acute distress, AO x 3 HEENT:  MMM, no JVD, no scleral icterus Cardiac: RRR, no murmurs, rubs, or gallops.  Respiratory: Clear to auscultation bilaterally. GI: Soft, nontender, non-distended  MS: No edema; No deformity. Neuro:  Nonfocal  Vasc:  +2 radial pulses  Telemetry demonstrates occasional PVCs.  Patient continues to experience N/V.  Recommend aggressive potassium repletion and discharge when reliably stable.  Discussed with patient.   Carisha Kantor, MD Pager [phone]   "

## 2024-04-29 NOTE — Plan of Care (Signed)

## 2024-04-30 DIAGNOSIS — R1115 Cyclical vomiting syndrome unrelated to migraine: Secondary | ICD-10-CM | POA: Diagnosis not present

## 2024-04-30 LAB — BASIC METABOLIC PANEL WITH GFR
Anion gap: 18 — ABNORMAL HIGH (ref 5–15)
BUN: 7 mg/dL (ref 6–20)
CO2: 23 mmol/L (ref 22–32)
Calcium: 9.8 mg/dL (ref 8.9–10.3)
Chloride: 91 mmol/L — ABNORMAL LOW (ref 98–111)
Creatinine, Ser: 0.78 mg/dL (ref 0.44–1.00)
GFR, Estimated: >60 mL/min
Glucose, Bld: 107 mg/dL — ABNORMAL HIGH (ref 70–99)
Potassium: 3.1 mmol/L — ABNORMAL LOW (ref 3.5–5.1)
Sodium: 133 mmol/L — ABNORMAL LOW (ref 135–145)

## 2024-04-30 LAB — POTASSIUM: Potassium: 3.4 mmol/L — ABNORMAL LOW (ref 3.5–5.1)

## 2024-04-30 MED ORDER — POTASSIUM CHLORIDE CRYS ER 20 MEQ PO TBCR: 40.0000 meq | EXTENDED_RELEASE_TABLET | Freq: Once | ORAL | Status: DC

## 2024-04-30 MED ORDER — ORAL CARE MOUTH RINSE: 15.0000 mL | OROMUCOSAL | Status: DC | PRN

## 2024-04-30 MED ORDER — POTASSIUM CHLORIDE CRYS ER 20 MEQ PO TBCR
40.0000 meq | EXTENDED_RELEASE_TABLET | Freq: Once | ORAL | Status: AC
  Administered 2024-04-30: 40 meq via ORAL
  Filled 2024-04-30: qty 2

## 2024-04-30 MED ORDER — POTASSIUM CHLORIDE 20 MEQ PO PACK
40.0000 meq | PACK | Freq: Once | ORAL | Status: DC
  Administered 2024-04-30: 40 meq via ORAL
  Filled 2024-04-30: qty 2

## 2024-04-30 MED ORDER — POTASSIUM CHLORIDE CRYS ER 20 MEQ PO TBCR
40.0000 meq | EXTENDED_RELEASE_TABLET | Freq: Two times a day (BID) | ORAL | Status: DC
  Administered 2024-04-30 – 2024-05-01 (×2): 40 meq via ORAL
  Filled 2024-04-30 (×2): qty 2

## 2024-04-30 NOTE — Progress Notes (Signed)
"  °  Progress Note  Patient Name: Angela Dixon Date of Encounter: 04/30/2024 [facility] Cardiologist: Dillon Mcreynolds K Esiquio Boesen, MD   Interval Summary    No additional IV potassium administered.  BMP pending  Had N/V this AM with breakfast.  PVCs improved on tele  Vital Signs Vitals:   04/29/24 2040 04/30/24 0426 04/30/24 0426 04/30/24 0801  BP: 115/83 133/85 133/85 (!) 138/104  Pulse:  96 94 88  Resp: 18 20 20 19   Temp: 99 F (37.2 C) 98.3 F (36.8 C) 98.3 F (36.8 C) 98.2 F (36.8 C)  TempSrc:      SpO2: 99% 99% 100% 100%  Weight:      Height:        Intake/Output Summary (Last 24 hours) at 04/30/2024 1051 Last data filed at 04/30/2024 0517 Gross per 24 hour  Intake --  Output 0 ml  Net 0 ml      04/25/2024    9:03 AM 04/25/2024    9:01 AM 01/07/2024    8:07 AM  Last 3 Weights  Weight (lbs) 190 lb 190 lb 178 lb 9.6 oz  Weight (kg) 86.183 kg 86.183 kg 81.012 kg      Telemetry/ECG   Sinus Rhythm - Personally Reviewed  Physical Exam  GEN: No acute distress.   Neck: No JVD Cardiac: RRR, no murmurs, rubs, or gallops.  Respiratory: Clear to auscultation bilaterally. GI: Soft, nontender, non-distended  MS: No edema  Assessment & Plan   39 y.o. female with a history of hyperlipidemia, GERD, IBS, anxiety/ depression, ADHD, obesity, alcohol abuse, and marijauna abuse with cannabis hyperemesis syndrome who was admitted on 04/25/2024 for prolonged QTc and Torsades in setting of severe electrolyte abnormalities due to possible cannabis hyperemesis.     Prolonged Qtc Torsades de Pointes  -- presented with nausea/vomiting and found to have severe hypokalemia and hypomagnesemia. Complicated by prolonged QTc and brief episode of Torsades while at [facility] ED (strips were not available). Prozac  held, electrolytes repleted -- Echo showed LVEF of 60-65% with normal wall motion, normal RV function, and no significant valvular disease -- EKG yesterday QTc was still prolonged  at 485 -- suspect this is related to electrolyte abnormalities in the setting of n/v. Recommend avoiding QTc prolonging medications (Adderall, Prozac , and Abilify have been stopped).  -- BMP pending today -- Would recommend keeping until K+ within normal range as she continues to have episodes of vomiting  -- I am concerned that the patient has not been able to maintain normal potassium levels especially considering TdP occurring at [facility] ED.   Per Primary Intractable nausea/ vomiting Cannabis hyperemesis syndrome Anxiety/ depression ADHD IBS GERD Asthma Hyperlipidemia  For questions or updates, please contact [facility] Please consult [email or web address] for contact info under         Signed, Druanne Bosques K Ryszard Socarras, MD    Patient ID: Angela Dixon, female   DOB: 10-Apr-1985, 39 y.o.   MRN: [id number]  "

## 2024-04-30 NOTE — Progress Notes (Signed)
 " PROGRESS NOTE    Angela Dixon  FMW:[id number] DOB: Feb 16, 1986 DOA: 04/25/2024 PCP: Billy Philippe SAUNDERS, NP   Brief Narrative:  This 39 years old female with PMH significant of hyperlipidemia, prolonged QT interval, GERD,  ADHD, anxiety, depression, obesity, IBS, chronic alcohol use, and cannabis use presented with complaints of intractable nausea and vomiting.  Patient is hemodynamically stable.  EKG shows prolonged QTc at 591.  Labs significant for WBC 16.8, potassium 2.9, calcium 10.6, magnesium  1.1.  Patient was given electrolyte replacement and IV hydration.  Respiratory viral panel negative.  UDS positive for benzos, amphetamine and THC.  While in the ED patient had a brief episode of sinus tachycardia with frequent PVCs and a brief run of appeared to be torsades.  Patient has received magnesium  sulfate and heart rhythm stabilized.  Patient was admitted for further evaluation and started on scopolamine  patch for nausea and vomiting.  Assessment & Plan:   Principal Problem:   Cyclical vomiting syndrome Active Problems:   Intractable nausea and vomiting   Frequent PVCs   GERD (gastroesophageal reflux disease)   IBS (irritable bowel syndrome)   Chronic alcohol abuse   ADHD   Prolonged QT interval   Hypokalemia   Hypomagnesemia   Leukocytosis   Marijuana use   Torsades de pointes (HCC)  Cyclical vomiting syndrome in the setting of marijuana use: Intractable Nausea / Vomiting: Patient presented in the ED with nausea and vomiting, There was concern for flu exposure.   Patient reported nonstop vomiting for 24 hours.  Denies any abdominal pain, diarrhea, fever and chills. Labs significant for WBC 16.8, potassium 2.9, calcium 10.6, low magnesium  1.1.   Respiratory panel negative for COVID, RSV and flu. UDS positive with benzodiazepine, amphetamine and THC. Patient received electrolyte replacement, antinausea medications. Patient has a brief episode of sinus tachycardia with frequent  PVC and brief run of appeared to be torsade.   Patient heart rhythm stabilized after receiving 1 g of magnesium  sulfate.   EKG has been obtained total 2 times which showed normal sinus rhythm,  heart rate 96 and prolonged QTc 591 ms. While in the ED patient did not had any episodes of nausea / vomiting.   Continue Protonix  daily, scopolamine  patch daily ,  Counseled patient for marijuana smoking cessation. She reports feeling much better, Nausea and vomiting improving. Continue Reglan  10 mg every 8 hours hours as needed.   History of frequent PVC: Torsade the pointes - resolved. Prolonged QTC Likely In the setting of electrolyte derangement from intractable nausea, vomiting and polypharmacy due to Adderall, Prozac , Abilify and marijuana use. Need to avoid Adderall, Prozac , Abilify and marijuana. Need to keep mag level above 2 potassium level above 4. Cardiology has been consulted and on board. TTE completed and appears reassuring.   Hypokalemia / Hypomagnesemia: Hypokalemia / Hypomagnesemia in the setting of electrolyte derangement. Potassium continued to remain low.  Cardiologist recommended aggressive IV replacement.  ADHD: Hold Adderall in the setting of tachycardia with frequent PVC and torsade the point.   Generalized anxiety disorder Hold Prozac  and Abilify  the setting of prolonged QTc.  Continue BuSpar .   History of chronic marijuana use: History of chronic alcohol use: Patient denies alcohol use.  Continue thiamine  and folic acid .  Checking blood alcohol level.  Continue CIWA protocol is unreliable historian.   History of asthma: Continue DuoNeb as needed.    DVT prophylaxis: Lovenox  Code Status: Full code Family Communication: No family at bed side. Disposition Plan:    Status is:  Inpatient Remains inpatient appropriate because: Admitted for intractable nausea and vomiting and found to have episode of torsades.  Cardiology consulted.  Patient not medically  ready for discharge.    Consultants:  Cardiology  Procedures: Echo  Antimicrobials:  Anti-infectives (From admission, onward)    None      Subjective: Patient was seen and examined at bedside.Overnight events noted. Patient reports nausea has improved,  Vomiting has improved. Patient continues to remain low, cardiology recommended aggressive IV replacement.  Objective: Vitals:   04/29/24 2040 04/30/24 0426 04/30/24 0426 04/30/24 0801  BP: 115/83 133/85 133/85 (!) 138/104  Pulse:  96 94 88  Resp: 18 20 20 19   Temp: 99 F (37.2 C) 98.3 F (36.8 C) 98.3 F (36.8 C) 98.2 F (36.8 C)  TempSrc:      SpO2: 99% 99% 100% 100%  Weight:      Height:        Intake/Output Summary (Last 24 hours) at 04/30/2024 1520 Last data filed at 04/30/2024 0517 Gross per 24 hour  Intake --  Output 0 ml  Net 0 ml   Filed Weights   04/25/24 0901 04/25/24 0903  Weight: 86.2 kg 86.2 kg    Examination:  General exam: Appears calm and comfortable, not in any acute distress. Respiratory system: CTA Bilaterally . Respiratory effort normal.  RR 14 Cardiovascular system: S1 & S2 heard, RRR. No JVD, murmurs, rubs, gallops or clicks.  Gastrointestinal system: Abdomen is nondistended, soft and nontender.  Normal bowel sounds heard. Central nervous system: Alert and oriented x 3. No focal neurological deficits. Extremities: No edema, no cyanosis, no clubbing Skin: No rashes, lesions or ulcers Psychiatry: Judgement and insight appear normal. Mood & affect appropriate.     Data Reviewed: I have personally reviewed following labs and imaging studies  CBC: Recent Labs  Lab 04/25/24 0920 04/26/24 0342  WBC 16.8* 13.9*  NEUTROABS 15.4*  --   HGB 13.6 12.4  HCT 38.2 34.9*  MCV 84.7 85.7  PLT 339 274   Basic Metabolic Panel: Recent Labs  Lab 04/25/24 0944 04/25/24 2134 04/26/24 0342 04/27/24 1005 04/28/24 1047 04/29/24 0818 04/29/24 0832 04/30/24 1036  NA  --  143 139 138 133*  133*  --  133*  K  --  3.2* 2.8* 2.7* 2.7* 3.0*  --  3.1*  CL  --  102 99 94* 93* 92*  --  91*  CO2  --  25 25 27 25 25   --  23  GLUCOSE  --  98 113* 84 96 83  --  107*  BUN  --  8 7 7  5* 5*  --  7  CREATININE  --  0.64 0.63 0.58 0.56 0.70  --  0.78  CALCIUM  --  8.7* 8.6* 9.3 9.1 9.3  --  9.8  MG 1.1* 2.2  --   --  2.0  --  2.1  --   PHOS  --  2.4*  --   --  1.2*  --  3.2  --    GFR: Estimated Creatinine Clearance: 100.3 mL/min (by C-G formula based on SCr of 0.78 mg/dL). Liver Function Tests: Recent Labs  Lab 04/25/24 0920 04/26/24 0342  AST 23 21  ALT 15 12  ALKPHOS 65 52  BILITOT 0.9 0.7  PROT 8.2* 6.9  ALBUMIN 5.1* 4.4   Recent Labs  Lab 04/25/24 0920  LIPASE 16   No results for input(s): AMMONIA in the last 168 hours. Coagulation Profile: No  results for input(s): INR, PROTIME in the last 168 hours. Cardiac Enzymes: No results for input(s): CKTOTAL, CKMB, CKMBINDEX, TROPONINI in the last 168 hours. BNP (last 3 results) No results for input(s): PROBNP in the last 8760 hours. HbA1C: No results for input(s): HGBA1C in the last 72 hours. CBG: No results for input(s): GLUCAP in the last 168 hours. Lipid Profile: No results for input(s): CHOL, HDL, LDLCALC, TRIG, CHOLHDL, LDLDIRECT in the last 72 hours. Thyroid  Function Tests: No results for input(s): TSH, T4TOTAL, FREET4, T3FREE, THYROIDAB in the last 72 hours.  Anemia Panel: No results for input(s): VITAMINB12, FOLATE, FERRITIN, TIBC, IRON, RETICCTPCT in the last 72 hours. Sepsis Labs: No results for input(s): PROCALCITON, LATICACIDVEN in the last 168 hours.  Recent Results (from the past 240 hours)  Resp panel by RT-PCR (RSV, Flu A&B, Covid) Anterior Nasal Swab     Status: None   Collection Time: 04/25/24  9:22 AM   Specimen: Anterior Nasal Swab  Result Value Ref Range Status   SARS Coronavirus 2 by RT PCR NEGATIVE NEGATIVE Final    Comment:  (NOTE) SARS-CoV-2 target nucleic acids are NOT DETECTED.  The SARS-CoV-2 RNA is generally detectable in upper respiratory specimens during the acute phase of infection. The lowest concentration of SARS-CoV-2 viral copies this assay can detect is 138 copies/mL. A negative result does not preclude SARS-Cov-2 infection and should not be used as the sole basis for treatment or other patient management decisions. A negative result may occur with  improper specimen collection/handling, submission of specimen other than nasopharyngeal swab, presence of viral mutation(s) within the areas targeted by this assay, and inadequate number of viral copies(<138 copies/mL). A negative result must be combined with clinical observations, patient history, and epidemiological information. The expected result is Negative.  Fact Sheet for Patients:  bloggercourse.com  Fact Sheet for Healthcare Providers:  seriousbroker.it  This test is no t yet approved or cleared by the [place]  FDA and  has been authorized for detection and/or diagnosis of SARS-CoV-2 by FDA under an Emergency Use Authorization (EUA). This EUA will remain  in effect (meaning this test can be used) for the duration of the COVID-19 declaration under Section 564(b)(1) of the Act, 21 U.S.C.section 360bbb-3(b)(1), unless the authorization is terminated  or revoked sooner.       Influenza A by PCR NEGATIVE NEGATIVE Final   Influenza B by PCR NEGATIVE NEGATIVE Final    Comment: (NOTE) The Xpert Xpress SARS-CoV-2/FLU/RSV plus assay is intended as an aid in the diagnosis of influenza from Nasopharyngeal swab specimens and should not be used as a sole basis for treatment. Nasal washings and aspirates are unacceptable for Xpert Xpress SARS-CoV-2/FLU/RSV testing.  Fact Sheet for Patients: bloggercourse.com  Fact Sheet for Healthcare  Providers: seriousbroker.it  This test is not yet approved or cleared by the [place]  FDA and has been authorized for detection and/or diagnosis of SARS-CoV-2 by FDA under an Emergency Use Authorization (EUA). This EUA will remain in effect (meaning this test can be used) for the duration of the COVID-19 declaration under Section 564(b)(1) of the Act, 21 U.S.C. section 360bbb-3(b)(1), unless the authorization is terminated or revoked.     Resp Syncytial Virus by PCR NEGATIVE NEGATIVE Final    Comment: (NOTE) Fact Sheet for Patients: bloggercourse.com  Fact Sheet for Healthcare Providers: seriousbroker.it  This test is not yet approved or cleared by the [place]  FDA and has been authorized for detection and/or diagnosis of SARS-CoV-2 by FDA under an  Emergency Use Authorization (EUA). This EUA will remain in effect (meaning this test can be used) for the duration of the COVID-19 declaration under Section 564(b)(1) of the Act, 21 U.S.C. section 360bbb-3(b)(1), unless the authorization is terminated or revoked.  Performed at [facility], [address], [place], [place] [address]     Radiology Studies: No results found.  Scheduled Meds:  busPIRone   15 mg Oral BID   docusate sodium   100 mg Oral BID   enoxaparin  (LOVENOX ) injection  40 mg Subcutaneous Q24H   folic acid   1 mg Oral q AM   pantoprazole   40 mg Oral q AM   potassium chloride   40 mEq Oral Once   scopolamine   1 patch Transdermal Q72H   sodium chloride  flush  3 mL Intravenous Q12H   sodium chloride  flush  3 mL Intravenous Q12H   thiamine   100 mg Oral Daily   Continuous Infusions:     LOS: 4 days    Time spent: 35 mins    Darcel Dawley, MD [facility]   If 7PM-7AM, please contact night-coverage  "

## 2024-04-30 NOTE — Plan of Care (Signed)

## 2024-05-01 NOTE — Plan of Care (Signed)
  Problem: Nutrition: Goal: Adequate nutrition will be maintained Outcome: Completed/Met   Problem: Safety: Goal: Ability to remain free from injury will improve Outcome: Completed/Met   Problem: Skin Integrity: Goal: Risk for impaired skin integrity will decrease Outcome: Completed/Met

## 2024-05-01 NOTE — Plan of Care (Signed)
   Problem: Education: Goal: Knowledge of General Education information will improve Description: Including pain rating scale, medication(s)/side effects and non-pharmacologic comfort measures Outcome: Completed/Met

## 2024-05-01 NOTE — Discharge Summary (Signed)
 "  Physician Discharge Summary  Angela Dixon FMW:[id number] DOB: May 24, 1985 DOA: 04/25/2024  PCP: Billy Philippe SAUNDERS, NP  Admit date: 04/25/2024 Discharge date: 05/01/2024  Admitted from: Home Discharge disposition: Home  Recommendations at discharge:  Stop marijuana, alcohol use Prescription sent for potassium chloride  20 mEq for a week. Follow-up with PCP within a week to recheck potassium and magnesium  level    Subjective: Patient was seen and examined this morning.  Pleasant young Caucasian female. Propped up in bed.  Not in distress.  No nausea or vomiting.  Tolerating oral diet. Wants to go home before the storm starts.  Brief narrative: Angela Dixon is a 39 y.o. female with PMH significant for chronic alcohol use, cannabis use, obesity, anxiety, depression, ADHD, IBS, HLD, GERD, cannabinoid hyperemesis syndrome, prolonged QTc 1/18, patient presented to ED with intractable nausea and vomiting. Reported nonstop vomiting for 24 hours.  No abdominal pain diarrhea, fever. Reports she has been on Wegovy in the last few weeks.  In the ED, patient afebrile, hemodynamically stable Labs showed WBC count of 16.8, potassium 2.9, magnesium  1.1, calcium 10.6 EKG with QTc prolonged at 591 ms UDS positive for benzodiazepine, amphetamine, THC RVP unremarkable.  While in the ED, patient had a brief episode of sinus tachycardia with frequent PVC and a brief run of what appeared to be torsades. 1 g of magnesium  sulfate was given, heart rhythm stabilized Cardiology was consulted and recommended hospitalization Admitted to [facility]  Hospital course: Intractable nausea, vomiting  Acute exacerbation of CHS in the setting of continued marijuana use Presented with intractable nausea, vomiting -likely related to [facility] as it has continued marijuana use. Choice of antiemetics was limited because of prolonged QTc. This morning, no nausea or vomiting.  Last vomited yesterday afternoon.  Able to tolerate  diet after that.  Patient wants to go home today.  She intends to stop [facility] Discharge on Protonix , Reglan  3 times daily for next few days, scopolamine  patch  Prolonged QTc Initial EKG with QTc prolonged at 591 ms While in the ED, patient had a brief episode of sinus tachycardia with frequent PVC and a brief run of what appeared to be torsades. 1 g of magnesium  sulfate was given, heart rhythm stabilized. Seen by cardiology Per cardiology, torsades de pointes secondary to severe hypokalemia and hypomagnesemia from vomiting. Electrolyte abnormalities corrected.  Monitored on telemetry.  PVCs improved. Echocardiogram showed normal EF, normal wall motion, normal RV function and no significant valve disease Repeat EKG 1/21 showed QTc improved to 485 ms.  Hypokalemia, Hypomagnesemia, hypophosphatemia, hyponatremia See below electrolytes due to intractable vomiting and poor oral intake. Levels gradually improved after replacement. Potassium was last checked last night.  It was 3.4 and further replacement was given. No labs this morning.  I offered patient repeat blood work this morning.  She states he is in rush to get home to run errands before the storm.  She has been given prescription for potassium chloride  20 mEq for a week.. Recent Labs  Lab 04/25/24 0944 04/25/24 2134 04/26/24 0342 04/27/24 1005 04/28/24 1047 04/29/24 0818 04/29/24 0832 04/30/24 1036 04/30/24 1940  NA  --  143 139 138 133* 133*  --  133*  --   K  --  3.2* 2.8* 2.7* 2.7* 3.0*  --  3.1* 3.4*  CL  --  102 99 94* 93* 92*  --  91*  --   CO2  --  25 25 27 25 25   --  23  --  GLUCOSE  --  98 113* 84 96 83  --  107*  --   BUN  --  8 7 7  5* 5*  --  7  --   CREATININE  --  0.64 0.63 0.58 0.56 0.70  --  0.78  --   CALCIUM  --  8.7* 8.6* 9.3 9.1 9.3  --  9.8  --   MG 1.1* 2.2  --   --  2.0  --  2.1  --   --   PHOS  --  2.4*  --   --  1.2*  --  3.2  --   --    Multiple psychiatric issues Anxiety, depression, ADHD Has  been taking Adderall, Abilify, Prozac , BuSpar  for a long time.  They were held in the hospital prolonged QTc.  I believe the QTc prolongation was due to severe electrolyte abnormalities.  Patient wants to resume her previous psych meds. I have suggested her to follow-up with PCP for repeat potassium level within a week.  Also follow-up with psychiatry to reduce or consolidate neuropsych meds.  Substance abuse (alcohol, marijuana), amphetamine positive in UDS likely due to use of Adderall Counseled to quit alcohol and marijuana  GERD PPI    Obesity 1 Body mass index is 32.61 kg/m. Patient has been advised to make an attempt to improve diet and exercise patterns to aid in weight loss.      H/o asthma: Continue bronchodilators PRN  Goals of care   Code Status: Full Code   Diet:  Diet Order             Diet general           Diet Heart Room service appropriate? Yes; Fluid consistency: Thin  Diet effective now                   Nutritional status:  Body mass index is 32.61 kg/m.       Wounds:  -    Discharge Medications:   Allergies as of 05/01/2024       Reactions   Wellbutrin  [bupropion ] Hives   Zoloft  [sertraline ] Other (See Comments)   Diaphoresis  Insomnia    Neomycin Rash        Medication List     STOP taking these medications    amphetamine-dextroamphetamine 10 MG tablet Commonly known as: ADDERALL   amphetamine-dextroamphetamine 30 MG 24 hr capsule Commonly known as: ADDERALL XR   ARIPiprazole 5 MG tablet Commonly known as: ABILIFY   FLUoxetine  40 MG capsule Commonly known as: PROZAC    Wegovy 1 MG/0.5ML Soaj SQ injection Generic drug: semaglutide-weight management       TAKE these medications    acetaminophen  500 MG tablet Commonly known as: TYLENOL  Take 1,000 mg by mouth every 6 (six) hours as needed for moderate pain (pain score 4-6) or mild pain (pain score 1-3).   busPIRone  15 MG tablet Commonly known as: BUSPAR  Take  1 tablet (15 mg total) by mouth 2 (two) times daily. What changed: Another medication with the same name was removed. Continue taking this medication, and follow the directions you see here.   cetirizine 10 MG tablet Commonly known as: ZYRTEC Take 10 mg by mouth daily.   folic acid  1 MG tablet Commonly known as: FOLVITE  Take 1 mg by mouth daily.   metoCLOPramide  10 MG tablet Commonly known as: REGLAN  Take 1 tablet (10 mg total) by mouth 3 (three) times daily with meals.   Mirena (  52 MG) 20 MCG/DAY Iud Generic drug: levonorgestrel 1 each by Intrauterine route once.   pantoprazole  40 MG tablet Commonly known as: PROTONIX  TAKE 1 TABLET (40 MG TOTAL) BY MOUTH TWICE A DAY BEFORE MEALS What changed: See the new instructions.   potassium chloride  SA 20 MEQ tablet Commonly known as: KLOR-CON  M Take 1 tablet (20 mEq total) by mouth daily.   scopolamine  1 MG/3DAYS Commonly known as: TRANSDERM-SCOP Place 1 patch (1 mg total) onto the skin every 3 (three) days.         Follow ups:    Follow-up Information     Billy Philippe SAUNDERS, NP Follow up in 1 week(s).   Specialty: Family Medicine Contact information: [address] [place] [address] [phone]                 Discharge Instructions:   Discharge Instructions     Call MD for:  difficulty breathing, headache or visual disturbances   Complete by: As directed    Call MD for:  persistant dizziness or light-headedness   Complete by: As directed    Call MD for:  persistant nausea and vomiting   Complete by: As directed    Diet general   Complete by: As directed    Discharge instructions   Complete by: As directed    Advised to follow-up with primary care physician in 1 week. Advised to take Reglan  10 mg every 8 hours as needed for nausea and vomiting. If nausea and vomiting continue to persist consider GI consult as an outpatient.   Increase activity slowly   Complete by: As directed         Discharge Exam:   Vitals:   04/30/24 1649 04/30/24 2012 05/01/24 0452 05/01/24 0912  BP: 124/81 115/70 124/82 110/71  Pulse: 97 90 88 94  Resp: 19 17 18    Temp: 98.2 F (36.8 C) 98.6 F (37 C) 98.7 F (37.1 C) 98.4 F (36.9 C)  TempSrc:    Oral  SpO2: 100% 99%  97%  Weight:      Height:        Body mass index is 32.61 kg/m.  General exam: Pleasant, young Caucasian female Skin: No rashes, lesions or ulcers. HEENT: Atraumatic, normocephalic, no obvious bleeding Lungs: Clear to auscultation bilaterally,  CVS: S1, S2, no murmur,   GI/Abd: Soft, nontender, nondistended, bowel sound present,   CNS: Alert, awake, oriented x 3 Psychiatry: Mood appropriate Extremities: No pedal edema, no calf tenderness,    The results of significant diagnostics from this hospitalization (including imaging, microbiology, ancillary and laboratory) are listed below for reference.    Procedures and Diagnostic Studies:   ECHOCARDIOGRAM COMPLETE Result Date: 04/26/2024    ECHOCARDIOGRAM REPORT   Patient Name:   Angela Dixon Date of Exam: 04/26/2024 Medical Rec #:  [id number]   Height:       64.0 in Accession #:    [id number]  Weight:       190.0 lb Date of Birth:  May 09, 1985   BSA:          1.914 m Patient Age:    38 years    BP:           120/71 mmHg Patient Gender: F           HR:           75 bpm. Exam Location:  Inpatient Procedure: 2D Echo, Cardiac Doppler and Color Doppler (Both Spectral and Color  Flow Doppler were utilized during procedure). Indications:    Abnormal ECG R94.31  History:        Patient has no prior history of Echocardiogram examinations.  Sonographer:    Tinnie Gosling RDCS Referring Phys: [id number] HUSAM M [facility] IMPRESSIONS  1. Left ventricular ejection fraction, by estimation, is 60 to 65%. The left ventricle has normal function. The left ventricle has no regional wall motion abnormalities. Left ventricular diastolic parameters were normal.  2. Right ventricular systolic  function is normal. The right ventricular size is normal.  3. The mitral valve is abnormal. Trivial mitral valve regurgitation. No evidence of mitral stenosis.  4. The aortic valve is normal in structure. Aortic valve regurgitation is not visualized. No aortic stenosis is present.  5. The inferior vena cava is normal in size with greater than 50% respiratory variability, suggesting right atrial pressure of 3 mmHg. FINDINGS  Left Ventricle: Left ventricular ejection fraction, by estimation, is 60 to 65%. The left ventricle has normal function. The left ventricle has no regional wall motion abnormalities. Strain was performed and the global longitudinal strain is indeterminate. The left ventricular internal cavity size was normal in size. There is no left ventricular hypertrophy. Left ventricular diastolic parameters were normal. Right Ventricle: The right ventricular size is normal. No increase in right ventricular wall thickness. Right ventricular systolic function is normal. Left Atrium: Left atrial size was normal in size. Right Atrium: Right atrial size was normal in size. Pericardium: There is no evidence of pericardial effusion. Mitral Valve: The mitral valve is abnormal. There is mild thickening of the mitral valve leaflet(s). There is mild calcification of the mitral valve leaflet(s). Trivial mitral valve regurgitation. No evidence of mitral valve stenosis. Tricuspid Valve: The tricuspid valve is normal in structure. Tricuspid valve regurgitation is trivial. No evidence of tricuspid stenosis. Aortic Valve: The aortic valve is normal in structure. Aortic valve regurgitation is not visualized. No aortic stenosis is present. Pulmonic Valve: The pulmonic valve was normal in structure. Pulmonic valve regurgitation is not visualized. No evidence of pulmonic stenosis. Aorta: The aortic root is normal in size and structure. Venous: The inferior vena cava is normal in size with greater than 50% respiratory  variability, suggesting right atrial pressure of 3 mmHg. IAS/Shunts: No atrial level shunt detected by color flow Doppler. Additional Comments: 3D was performed not requiring image post processing on an independent workstation and was indeterminate.  LEFT VENTRICLE PLAX 2D LVIDd:         4.70 cm      Diastology LVIDs:         2.90 cm      LV e' medial:    11.50 cm/s LV PW:         0.90 cm      LV E/e' medial:  7.9 LV IVS:        0.90 cm      LV e' lateral:   17.00 cm/s LVOT diam:     2.10 cm      LV E/e' lateral: 5.3 LV SV:         82 LV SV Index:   43 LVOT Area:     3.46 cm  LV Volumes (MOD) LV vol d, MOD A2C: 117.0 ml LV vol d, MOD A4C: 105.0 ml LV vol s, MOD A2C: 32.7 ml LV vol s, MOD A4C: 41.4 ml LV SV MOD A2C:     84.3 ml LV SV MOD A4C:     105.0 ml LV  SV MOD BP:      80.1 ml RIGHT VENTRICLE             IVC RV S prime:     17.10 cm/s  IVC diam: 2.00 cm TAPSE (M-mode): 2.5 cm                             PULMONARY VEINS                             Diastolic Velocity: 22.80 cm/s                             S/D Velocity:       1.30                             Systolic Velocity:  29.60 cm/s LEFT ATRIUM             Index        RIGHT ATRIUM           Index LA diam:        3.50 cm 1.83 cm/m   RA Area:     12.00 cm LA Vol (A2C):   56.1 ml 29.31 ml/m  RA Volume:   24.00 ml  12.54 ml/m LA Vol (A4C):   42.7 ml 22.31 ml/m LA Biplane Vol: 52.3 ml 27.32 ml/m  AORTIC VALVE LVOT Vmax:   118.00 cm/s LVOT Vmean:  76.900 cm/s LVOT VTI:    0.238 m  AORTA Ao Root diam: 2.50 cm Ao Asc diam:  3.00 cm MITRAL VALVE MV Area (PHT): 4.21 cm    SHUNTS MV Decel Time: 180 msec    Systemic VTI:  0.24 m MV E velocity: 90.70 cm/s  Systemic Diam: 2.10 cm MV A velocity: 81.80 cm/s MV E/A ratio:  1.11 Maude Emmer MD Electronically signed by Maude Emmer MD Signature Date/Time: 04/26/2024/10:53:29 AM    Final      Labs:   Basic Metabolic Panel: Recent Labs  Lab 04/25/24 0944 04/25/24 2134 04/26/24 0342 04/27/24 1005  04/28/24 1047 04/29/24 0818 04/29/24 0832 04/30/24 1036 04/30/24 1940  NA  --  143 139 138 133* 133*  --  133*  --   K  --  3.2* 2.8* 2.7* 2.7* 3.0*  --  3.1* 3.4*  CL  --  102 99 94* 93* 92*  --  91*  --   CO2  --  25 25 27 25 25   --  23  --   GLUCOSE  --  98 113* 84 96 83  --  107*  --   BUN  --  8 7 7  5* 5*  --  7  --   CREATININE  --  0.64 0.63 0.58 0.56 0.70  --  0.78  --   CALCIUM  --  8.7* 8.6* 9.3 9.1 9.3  --  9.8  --   MG 1.1* 2.2  --   --  2.0  --  2.1  --   --   PHOS  --  2.4*  --   --  1.2*  --  3.2  --   --    GFR Estimated Creatinine Clearance: 100.3 mL/min (by C-G formula based on SCr of 0.78 mg/dL). Liver Function Tests: Recent Labs  Lab 04/25/24  0920 04/26/24 0342  AST 23 21  ALT 15 12  ALKPHOS 65 52  BILITOT 0.9 0.7  PROT 8.2* 6.9  ALBUMIN 5.1* 4.4   Recent Labs  Lab 04/25/24 0920  LIPASE 16   No results for input(s): AMMONIA in the last 168 hours. Coagulation profile No results for input(s): INR, PROTIME in the last 168 hours.  CBC: Recent Labs  Lab 04/25/24 0920 04/26/24 0342  WBC 16.8* 13.9*  NEUTROABS 15.4*  --   HGB 13.6 12.4  HCT 38.2 34.9*  MCV 84.7 85.7  PLT 339 274   Cardiac Enzymes: No results for input(s): CKTOTAL, CKMB, CKMBINDEX, TROPONINI in the last 168 hours. BNP: Invalid input(s): POCBNP CBG: No results for input(s): GLUCAP in the last 168 hours. D-Dimer No results for input(s): DDIMER in the last 72 hours. Hgb A1c No results for input(s): HGBA1C in the last 72 hours. Lipid Profile No results for input(s): CHOL, HDL, LDLCALC, TRIG, CHOLHDL, LDLDIRECT in the last 72 hours. Thyroid  function studies No results for input(s): TSH, T4TOTAL, T3FREE, THYROIDAB in the last 72 hours.  Invalid input(s): FREET3 Anemia work up No results for input(s): VITAMINB12, FOLATE, FERRITIN, TIBC, IRON, RETICCTPCT in the last 72 hours. Microbiology Recent Results (from the past  240 hours)  Resp panel by RT-PCR (RSV, Flu A&B, Covid) Anterior Nasal Swab     Status: None   Collection Time: 04/25/24  9:22 AM   Specimen: Anterior Nasal Swab  Result Value Ref Range Status   SARS Coronavirus 2 by RT PCR NEGATIVE NEGATIVE Final    Comment: (NOTE) SARS-CoV-2 target nucleic acids are NOT DETECTED.  The SARS-CoV-2 RNA is generally detectable in upper respiratory specimens during the acute phase of infection. The lowest concentration of SARS-CoV-2 viral copies this assay can detect is 138 copies/mL. A negative result does not preclude SARS-Cov-2 infection and should not be used as the sole basis for treatment or other patient management decisions. A negative result may occur with  improper specimen collection/handling, submission of specimen other than nasopharyngeal swab, presence of viral mutation(s) within the areas targeted by this assay, and inadequate number of viral copies(<138 copies/mL). A negative result must be combined with clinical observations, patient history, and epidemiological information. The expected result is Negative.  Fact Sheet for Patients:  bloggercourse.com  Fact Sheet for Healthcare Providers:  seriousbroker.it  This test is no t yet approved or cleared by the [place]  FDA and  has been authorized for detection and/or diagnosis of SARS-CoV-2 by FDA under an Emergency Use Authorization (EUA). This EUA will remain  in effect (meaning this test can be used) for the duration of the COVID-19 declaration under Section 564(b)(1) of the Act, 21 U.S.C.section 360bbb-3(b)(1), unless the authorization is terminated  or revoked sooner.       Influenza A by PCR NEGATIVE NEGATIVE Final   Influenza B by PCR NEGATIVE NEGATIVE Final    Comment: (NOTE) The Xpert Xpress SARS-CoV-2/FLU/RSV plus assay is intended as an aid in the diagnosis of influenza from Nasopharyngeal swab specimens and should  not be used as a sole basis for treatment. Nasal washings and aspirates are unacceptable for Xpert Xpress SARS-CoV-2/FLU/RSV testing.  Fact Sheet for Patients: bloggercourse.com  Fact Sheet for Healthcare Providers: seriousbroker.it  This test is not yet approved or cleared by the [place]  FDA and has been authorized for detection and/or diagnosis of SARS-CoV-2 by FDA under an Emergency Use Authorization (EUA). This EUA will remain in effect (meaning this test can  be used) for the duration of the COVID-19 declaration under Section 564(b)(1) of the Act, 21 U.S.C. section 360bbb-3(b)(1), unless the authorization is terminated or revoked.     Resp Syncytial Virus by PCR NEGATIVE NEGATIVE Final    Comment: (NOTE) Fact Sheet for Patients: bloggercourse.com  Fact Sheet for Healthcare Providers: seriousbroker.it  This test is not yet approved or cleared by the [place]  FDA and has been authorized for detection and/or diagnosis of SARS-CoV-2 by FDA under an Emergency Use Authorization (EUA). This EUA will remain in effect (meaning this test can be used) for the duration of the COVID-19 declaration under Section 564(b)(1) of the Act, 21 U.S.C. section 360bbb-3(b)(1), unless the authorization is terminated or revoked.  Performed at [facility], [address] [place], [place] [address]     Time coordinating discharge: 45 minutes  Signed: Chapman Rota  [facility] 05/01/2024, 11:22 AM   "

## 2024-05-03 ENCOUNTER — Telehealth: Payer: Self-pay

## 2024-05-03 NOTE — Transitions of Care (Post Inpatient/ED Visit) (Signed)
" ° °  05/03/2024  Name: Angela Dixon MRN: [id number] DOB: Jul 15, 1985  Today's TOC FU Call Status: Today's TOC FU Call Status:: Successful TOC FU Call Completed TOC FU Call Complete Date: 05/03/24  Patient's Name and Date of Birth confirmed. Name, DOB  Transition Care Management Follow-up Telephone Call Date of Discharge: 05/01/24 Discharge Facility: [facility]) Type of Discharge: Inpatient Admission Primary Inpatient Discharge Diagnosis:: N/V How have you been since you were released from the hospital?: Better Any questions or concerns?: No  Items Reviewed: Did you receive and understand the discharge instructions provided?: Yes Medications obtained,verified, and reconciled?: Yes (Medications Reviewed) Any new allergies since your discharge?: No Dietary orders reviewed?: Yes Do you have support at home?: No  Medications Reviewed Today: Medications Reviewed Today     Reviewed by Emmitt Pan, LPN (Licensed Practical Nurse) on 05/03/24 at 1213  Med List Status: <None>   Medication Order Taking? Sig Documenting Provider Last Dose Status Informant  acetaminophen  (TYLENOL ) 500 MG tablet [id number] Yes Take 1,000 mg by mouth every 6 (six) hours as needed for moderate pain (pain score 4-6) or mild pain (pain score 1-3). [provider]  Active Self, Pharmacy Records  busPIRone  (BUSPAR ) 15 MG tablet [id number] Yes Take 1 tablet (15 mg total) by mouth 2 (two) times daily. Leotis Bogus, MD  Active   cetirizine (ZYRTEC) 10 MG tablet [id number] Yes Take 10 mg by mouth daily. [provider]  Active Self, Pharmacy Records  folic acid  (FOLVITE ) 1 MG tablet [id number] Yes Take 1 mg by mouth daily. [provider]  Active Self, Pharmacy Records  levonorgestrel (MIRENA, 52 MG,) 20 MCG/DAY IUD [id number] Yes 1 each by Intrauterine route once. [provider]  Active Self, Pharmacy Records  metoCLOPramide  (REGLAN ) 10 MG tablet [id number] Yes Take 1 tablet (10 mg  total) by mouth 3 (three) times daily with meals. Leotis Bogus, MD  Active   pantoprazole  (PROTONIX ) 40 MG tablet [id number] Yes TAKE 1 TABLET (40 MG TOTAL) BY MOUTH TWICE A DAY BEFORE MEALS  Patient taking differently: Take 40 mg by mouth daily.   Craig Alan SAUNDERS, PA-C  Active Self, Pharmacy Records  potassium chloride  SA (KLOR-CON  M) 20 MEQ tablet [id number] Yes Take 1 tablet (20 mEq total) by mouth daily. Leotis Bogus, MD  Active   scopolamine  (TRANSDERM-SCOP) 1 MG/3DAYS [id number] Yes Place 1 patch (1 mg total) onto the skin every 3 (three) days. Leotis Bogus, MD  Active             Home Care and Equipment/Supplies: Were Home Health Services Ordered?: NA Any new equipment or medical supplies ordered?: NA  Functional Questionnaire: Do you need assistance with bathing/showering or dressing?: No Do you need assistance with meal preparation?: No Do you need assistance with eating?: No Do you have difficulty maintaining continence: No Do you need assistance with getting out of bed/getting out of a chair/moving?: No Do you have difficulty managing or taking your medications?: No  Follow up appointments reviewed: PCP Follow-up appointment confirmed?: Yes Date of PCP follow-up appointment?: 05/13/24 Follow-up Provider: [facility] Follow-up appointment confirmed?: NA Do you need transportation to your follow-up appointment?: No Do you understand care options if your condition(s) worsen?: Yes-patient verbalized understanding    SIGNATURE Pan Emmitt, LPN [facility] Nurse Health Advisor Direct Dial [phone]  "

## 2024-05-13 ENCOUNTER — Ambulatory Visit: Admitting: Family Medicine

## 2024-05-13 ENCOUNTER — Encounter: Payer: Self-pay | Admitting: Family Medicine

## 2024-05-13 ENCOUNTER — Telehealth: Payer: Self-pay

## 2024-05-13 ENCOUNTER — Ambulatory Visit

## 2024-05-13 ENCOUNTER — Ambulatory Visit: Payer: Self-pay | Admitting: Family Medicine

## 2024-05-13 VITALS — BP 120/78 | HR 96 | Temp 98.2°F | Ht 64.0 in | Wt 188.0 lb

## 2024-05-13 DIAGNOSIS — R200 Anesthesia of skin: Secondary | ICD-10-CM

## 2024-05-13 DIAGNOSIS — E559 Vitamin D deficiency, unspecified: Secondary | ICD-10-CM

## 2024-05-13 DIAGNOSIS — E876 Hypokalemia: Secondary | ICD-10-CM

## 2024-05-13 DIAGNOSIS — E538 Deficiency of other specified B group vitamins: Secondary | ICD-10-CM

## 2024-05-13 DIAGNOSIS — F1010 Alcohol abuse, uncomplicated: Secondary | ICD-10-CM

## 2024-05-13 DIAGNOSIS — Z09 Encounter for follow-up examination after completed treatment for conditions other than malignant neoplasm: Secondary | ICD-10-CM

## 2024-05-13 LAB — BASIC METABOLIC PANEL WITH GFR
BUN: 7 mg/dL (ref 6–23)
CO2: 28 meq/L (ref 19–32)
Calcium: 7.5 mg/dL — ABNORMAL LOW (ref 8.4–10.5)
Chloride: 103 meq/L (ref 96–112)
Creatinine, Ser: 0.72 mg/dL (ref 0.40–1.20)
GFR: 105.18 mL/min
Glucose, Bld: 73 mg/dL (ref 70–99)
Potassium: 3.6 meq/L (ref 3.5–5.1)
Sodium: 140 meq/L (ref 135–145)

## 2024-05-13 LAB — VITAMIN B12: Vitamin B-12: 409 pg/mL (ref 211–911)

## 2024-05-13 LAB — VITAMIN D 25 HYDROXY (VIT D DEFICIENCY, FRACTURES): VITD: 19.96 ng/mL — ABNORMAL LOW (ref 30.00–100.00)

## 2024-05-13 LAB — MAGNESIUM: Magnesium: 0.6 mg/dL — CL (ref 1.5–2.5)

## 2024-05-13 MED ORDER — VITAMIN D3 1.25 MG (50000 UT) PO CAPS: 1.2500 mg | ORAL_CAPSULE | ORAL | 0 refills | Status: AC

## 2024-05-13 NOTE — Patient Instructions (Addendum)
-  It was good to see you and I hope you continue to do well. -Ordered labs based on low potassium/low magnesium  during recent hospital admission. -Ordered vitamin B12/D for deficiencies.  -Recommend to follow up with psychiatry as scheduled.  -Office will call with lab results and will be available via MyChart.  -Follow up in 6 months for a physical and please be fasting.

## 2024-05-13 NOTE — Progress Notes (Addendum)
 "  Established Patient Office Visit   Subjective:  Patient ID: Angela Dixon, female    DOB: April 14, 1985  Age: 39 y.o. MRN: [id number]  Chief Complaint  Patient presents with   Hospitalization Follow-up    HPI Patient was admitted at [facility] on 04/25/2024 through 05/01/2024 who presented with intractable nausea and vomiting. While in the ED she had a brief episode of sinus tachy acardia with frequent PVC and brief run of what appeared to be torsades. Magnesium  was given with heart rhythm stabilizing. Cardiology consulted with admitting for intractable nausea vomiting, acute exacerbation of CHS in the setting of continued marijuana use, prolonged QTC, hypokalemia, hypomagnesemia, hypophosphatemia, and hyponatremia, multiple psychiatric issues (anxiety, depression, and anxiety), and substance abuse (alcohol and marijuana). She was to stop taking Adderall, Abilify, Prozac , and Wegovy. She was advised to follow up with PCP in 1 week, with a prescription of Reglan . Also, prescribed potassium and has finished medication. Needed a recheck on potassium and magnesium  level.  She reports she has been feeling good. She reports it took a few days, but has improved. She reports she as resumed Adderall, Abilify, and Prozac . She reports she has an appointment next week via telehealth with Cruz Brakeman, NP. She reports she has stopped marijuana use and cut back on drinking alcohol.   Patient has a history of vitamin B12 deficiency and reports she has noticed her hands going numb and stiff. Also, has a history of vitamin D  deficiency.  ROS See HPI above     Objective:   BP 120/78   Pulse 96   Temp 98.2 F (36.8 C) (Oral)   Ht 5' 4 (1.626 m)   Wt 188 lb (85.3 kg)   SpO2 97%   BMI 32.27 kg/m    Physical Exam Vitals reviewed.  Constitutional:      General: She is not in acute distress.    Appearance: Normal appearance. She is obese. She is not ill-appearing, toxic-appearing or diaphoretic.   HENT:     Head: Normocephalic and atraumatic.  Eyes:     General:        Right eye: No discharge.        Left eye: No discharge.     Conjunctiva/sclera: Conjunctivae normal.  Cardiovascular:     Rate and Rhythm: Normal rate and regular rhythm.     Heart sounds: Normal heart sounds. No murmur heard.    No friction rub. No gallop.  Pulmonary:     Effort: Pulmonary effort is normal. No respiratory distress.     Breath sounds: Normal breath sounds.  Musculoskeletal:        General: Normal range of motion.  Skin:    General: Skin is warm and dry.  Neurological:     General: No focal deficit present.     Mental Status: She is alert and oriented to person, place, and time. Mental status is at baseline.  Psychiatric:        Mood and Affect: Mood normal.        Behavior: Behavior normal.        Thought Content: Thought content normal.        Judgment: Judgment normal.      Assessment & Plan:  Hospital discharge follow-up -     Basic metabolic panel with GFR -     Magnesium   Hypokalemia -     Basic metabolic panel with GFR  Hypomagnesemia -     Magnesium   B12 deficiency -  Vitamin B12  Vitamin D  deficiency -     VITAMIN D  25 Hydroxy (Vit-D Deficiency, Fractures)  -Ordered labs based on low potassium/low magnesium  during recent hospital admission. -Ordered vitamin B12/D for deficiencies.  -Recommend to follow up with psychiatry as scheduled.  -Office will call with lab results and will be available via M  Return in about 6 months (around 11/10/2024) for physical.   Ladasia Sircy, NP "

## 2024-05-13 NOTE — Addendum Note (Signed)
 Addended by: ELNER NANNY B on: 05/13/2024 01:38 PM   Modules accepted: Orders

## 2024-05-13 NOTE — Telephone Encounter (Signed)
 Received a call from [facility] lab for patient for a critical low magnesium  of a 0.6

## 2024-11-10 ENCOUNTER — Encounter: Admitting: Family Medicine
